# Patient Record
Sex: Male | Born: 1945 | Race: White | Hispanic: No | Marital: Single | Smoking: Former smoker
Health system: Southern US, Community
[De-identification: ages and names within clinical notes are randomized; demographics above are authoritative.]

## PROBLEM LIST (undated history)

## (undated) DIAGNOSIS — I639 Cerebral infarction, unspecified: Secondary | ICD-10-CM

## (undated) DIAGNOSIS — R233 Spontaneous ecchymoses: Secondary | ICD-10-CM

## (undated) DIAGNOSIS — I2510 Atherosclerotic heart disease of native coronary artery without angina pectoris: Secondary | ICD-10-CM

## (undated) DIAGNOSIS — R197 Diarrhea, unspecified: Secondary | ICD-10-CM

## (undated) DIAGNOSIS — F32A Depression, unspecified: Secondary | ICD-10-CM

## (undated) DIAGNOSIS — T8859XA Other complications of anesthesia, initial encounter: Secondary | ICD-10-CM

## (undated) DIAGNOSIS — R531 Weakness: Secondary | ICD-10-CM

## (undated) DIAGNOSIS — T884XXA Failed or difficult intubation, initial encounter: Secondary | ICD-10-CM

## (undated) DIAGNOSIS — G709 Myoneural disorder, unspecified: Secondary | ICD-10-CM

## (undated) DIAGNOSIS — E785 Hyperlipidemia, unspecified: Secondary | ICD-10-CM

## (undated) DIAGNOSIS — I739 Peripheral vascular disease, unspecified: Secondary | ICD-10-CM

## (undated) DIAGNOSIS — E119 Type 2 diabetes mellitus without complications: Secondary | ICD-10-CM

## (undated) DIAGNOSIS — I779 Disorder of arteries and arterioles, unspecified: Secondary | ICD-10-CM

## (undated) DIAGNOSIS — I10 Essential (primary) hypertension: Secondary | ICD-10-CM

## (undated) DIAGNOSIS — R238 Other skin changes: Secondary | ICD-10-CM

## (undated) DIAGNOSIS — Z720 Tobacco use: Secondary | ICD-10-CM

## (undated) DIAGNOSIS — I509 Heart failure, unspecified: Secondary | ICD-10-CM

## (undated) DIAGNOSIS — D649 Anemia, unspecified: Secondary | ICD-10-CM

## (undated) DIAGNOSIS — T4145XA Adverse effect of unspecified anesthetic, initial encounter: Secondary | ICD-10-CM

## (undated) DIAGNOSIS — I5022 Chronic systolic (congestive) heart failure: Secondary | ICD-10-CM

## (undated) DIAGNOSIS — N289 Disorder of kidney and ureter, unspecified: Secondary | ICD-10-CM

## (undated) DIAGNOSIS — I255 Ischemic cardiomyopathy: Secondary | ICD-10-CM

## (undated) DIAGNOSIS — M1990 Unspecified osteoarthritis, unspecified site: Secondary | ICD-10-CM

## (undated) DIAGNOSIS — F329 Major depressive disorder, single episode, unspecified: Secondary | ICD-10-CM

## (undated) HISTORY — DX: Ischemic cardiomyopathy: I25.5

## (undated) HISTORY — DX: Atherosclerotic heart disease of native coronary artery without angina pectoris: I25.10

## (undated) HISTORY — DX: Chronic systolic (congestive) heart failure: I50.22

## (undated) HISTORY — DX: Weakness: R53.1

## (undated) HISTORY — DX: Cerebral infarction, unspecified: I63.9

## (undated) HISTORY — PX: ERCP: SHX60

## (undated) HISTORY — DX: Type 2 diabetes mellitus without complications: E11.9

## (undated) HISTORY — PX: OTHER SURGICAL HISTORY: SHX169

## (undated) HISTORY — DX: Unspecified osteoarthritis, unspecified site: M19.90

## (undated) HISTORY — DX: Peripheral vascular disease, unspecified: I73.9

## (undated) HISTORY — DX: Spontaneous ecchymoses: R23.3

## (undated) HISTORY — PX: COLON SURGERY: SHX602

## (undated) HISTORY — DX: Diarrhea, unspecified: R19.7

## (undated) HISTORY — PX: CERVICAL DISC SURGERY: SHX588

## (undated) HISTORY — DX: Disorder of arteries and arterioles, unspecified: I77.9

## (undated) HISTORY — DX: Other skin changes: R23.8

---

## 2005-11-26 ENCOUNTER — Emergency Department (HOSPITAL_COMMUNITY): Admission: EM | Admit: 2005-11-26 | Discharge: 2005-11-26 | Payer: Self-pay | Admitting: Family Medicine

## 2009-02-01 HISTORY — PX: EYE SURGERY: SHX253

## 2010-09-24 ENCOUNTER — Other Ambulatory Visit: Payer: Self-pay | Admitting: Cardiology

## 2010-09-25 ENCOUNTER — Encounter (INDEPENDENT_AMBULATORY_CARE_PROVIDER_SITE_OTHER): Payer: 59 | Admitting: *Deleted

## 2010-09-25 DIAGNOSIS — H34219 Partial retinal artery occlusion, unspecified eye: Secondary | ICD-10-CM

## 2010-09-25 DIAGNOSIS — I6529 Occlusion and stenosis of unspecified carotid artery: Secondary | ICD-10-CM

## 2010-10-06 ENCOUNTER — Encounter: Payer: Self-pay | Admitting: *Deleted

## 2011-04-06 ENCOUNTER — Encounter (HOSPITAL_COMMUNITY)
Admission: EM | Disposition: A | Discharge status: Home / Self Care | Payer: Self-pay | Source: Ambulatory Visit | Attending: Thoracic Surgery (Cardiothoracic Vascular Surgery)

## 2011-04-06 ENCOUNTER — Other Ambulatory Visit: Payer: Self-pay

## 2011-04-06 ENCOUNTER — Emergency Department (HOSPITAL_COMMUNITY): Payer: 59 | Admitting: Anesthesiology

## 2011-04-06 ENCOUNTER — Inpatient Hospital Stay (HOSPITAL_COMMUNITY)
Admission: EM | Admit: 2011-04-06 | Discharge: 2011-04-16 | DRG: 233 | Disposition: A | Discharge status: Home / Self Care | Payer: 59 | Source: Ambulatory Visit | Attending: Thoracic Surgery (Cardiothoracic Vascular Surgery) | Admitting: Thoracic Surgery (Cardiothoracic Vascular Surgery)

## 2011-04-06 ENCOUNTER — Encounter (HOSPITAL_COMMUNITY): Payer: Self-pay | Admitting: Anesthesiology

## 2011-04-06 ENCOUNTER — Encounter (HOSPITAL_COMMUNITY): Payer: Self-pay | Admitting: Emergency Medicine

## 2011-04-06 ENCOUNTER — Emergency Department (HOSPITAL_COMMUNITY): Payer: 59

## 2011-04-06 ENCOUNTER — Inpatient Hospital Stay (HOSPITAL_COMMUNITY): Payer: 59

## 2011-04-06 DIAGNOSIS — IMO0002 Reserved for concepts with insufficient information to code with codable children: Secondary | ICD-10-CM | POA: Diagnosis not present

## 2011-04-06 DIAGNOSIS — I10 Essential (primary) hypertension: Secondary | ICD-10-CM

## 2011-04-06 DIAGNOSIS — Z720 Tobacco use: Secondary | ICD-10-CM

## 2011-04-06 DIAGNOSIS — R4701 Aphasia: Secondary | ICD-10-CM | POA: Diagnosis not present

## 2011-04-06 DIAGNOSIS — I2109 ST elevation (STEMI) myocardial infarction involving other coronary artery of anterior wall: Principal | ICD-10-CM | POA: Diagnosis present

## 2011-04-06 DIAGNOSIS — I213 ST elevation (STEMI) myocardial infarction of unspecified site: Secondary | ICD-10-CM

## 2011-04-06 DIAGNOSIS — R079 Chest pain, unspecified: Secondary | ICD-10-CM

## 2011-04-06 DIAGNOSIS — E785 Hyperlipidemia, unspecified: Secondary | ICD-10-CM

## 2011-04-06 DIAGNOSIS — I2510 Atherosclerotic heart disease of native coronary artery without angina pectoris: Secondary | ICD-10-CM

## 2011-04-06 DIAGNOSIS — Y9210 Unspecified residential institution as the place of occurrence of the external cause: Secondary | ICD-10-CM | POA: Diagnosis not present

## 2011-04-06 DIAGNOSIS — E872 Acidosis, unspecified: Secondary | ICD-10-CM | POA: Diagnosis not present

## 2011-04-06 DIAGNOSIS — I214 Non-ST elevation (NSTEMI) myocardial infarction: Secondary | ICD-10-CM

## 2011-04-06 DIAGNOSIS — F17200 Nicotine dependence, unspecified, uncomplicated: Secondary | ICD-10-CM | POA: Diagnosis present

## 2011-04-06 DIAGNOSIS — E119 Type 2 diabetes mellitus without complications: Secondary | ICD-10-CM | POA: Diagnosis present

## 2011-04-06 DIAGNOSIS — R570 Cardiogenic shock: Secondary | ICD-10-CM

## 2011-04-06 DIAGNOSIS — N179 Acute kidney failure, unspecified: Secondary | ICD-10-CM | POA: Diagnosis present

## 2011-04-06 DIAGNOSIS — J90 Pleural effusion, not elsewhere classified: Secondary | ICD-10-CM | POA: Diagnosis present

## 2011-04-06 DIAGNOSIS — R0789 Other chest pain: Secondary | ICD-10-CM

## 2011-04-06 DIAGNOSIS — R0600 Dyspnea, unspecified: Secondary | ICD-10-CM

## 2011-04-06 DIAGNOSIS — Z8249 Family history of ischemic heart disease and other diseases of the circulatory system: Secondary | ICD-10-CM

## 2011-04-06 DIAGNOSIS — R4182 Altered mental status, unspecified: Secondary | ICD-10-CM | POA: Diagnosis not present

## 2011-04-06 DIAGNOSIS — I509 Heart failure, unspecified: Secondary | ICD-10-CM | POA: Diagnosis present

## 2011-04-06 DIAGNOSIS — I519 Heart disease, unspecified: Secondary | ICD-10-CM | POA: Diagnosis present

## 2011-04-06 DIAGNOSIS — I6340 Cerebral infarction due to embolism of unspecified cerebral artery: Secondary | ICD-10-CM | POA: Diagnosis not present

## 2011-04-06 DIAGNOSIS — J9819 Other pulmonary collapse: Secondary | ICD-10-CM | POA: Diagnosis present

## 2011-04-06 DIAGNOSIS — D62 Acute posthemorrhagic anemia: Secondary | ICD-10-CM | POA: Diagnosis not present

## 2011-04-06 DIAGNOSIS — Y849 Medical procedure, unspecified as the cause of abnormal reaction of the patient, or of later complication, without mention of misadventure at the time of the procedure: Secondary | ICD-10-CM | POA: Diagnosis not present

## 2011-04-06 DIAGNOSIS — R000 Tachycardia, unspecified: Secondary | ICD-10-CM

## 2011-04-06 HISTORY — DX: Essential (primary) hypertension: I10

## 2011-04-06 HISTORY — DX: Tobacco use: Z72.0

## 2011-04-06 HISTORY — DX: Hyperlipidemia, unspecified: E78.5

## 2011-04-06 HISTORY — PX: LEFT HEART CATHETERIZATION WITH CORONARY ANGIOGRAM: SHX5451

## 2011-04-06 HISTORY — PX: CORONARY ARTERY BYPASS GRAFT: SHX141

## 2011-04-06 HISTORY — DX: Heart failure, unspecified: I50.9

## 2011-04-06 LAB — POCT I-STAT, CHEM 8
Chloride: 105 mEq/L (ref 96–112)
Glucose, Bld: 124 mg/dL — ABNORMAL HIGH (ref 70–99)
HCT: 26 % — ABNORMAL LOW (ref 39.0–52.0)
Hemoglobin: 8.8 g/dL — ABNORMAL LOW (ref 13.0–17.0)
Potassium: 4.4 mEq/L (ref 3.5–5.1)
Sodium: 133 mEq/L — ABNORMAL LOW (ref 135–145)

## 2011-04-06 LAB — CBC
HCT: 40.2 % (ref 39.0–52.0)
HCT: 31.2 % — ABNORMAL LOW (ref 39.0–52.0)
HCT: 28.1 % — ABNORMAL LOW (ref 39.0–52.0)
Hemoglobin: 14.5 g/dL (ref 13.0–17.0)
Hemoglobin: 10.2 g/dL — ABNORMAL LOW (ref 13.0–17.0)
MCH: 32 pg (ref 26.0–34.0)
MCHC: 36.1 g/dL — ABNORMAL HIGH (ref 30.0–36.0)
Platelets: 213 10*3/uL (ref 150–400)
RBC: 3.58 MIL/uL — ABNORMAL LOW (ref 4.22–5.81)
RDW: 14.1 % (ref 11.5–15.5)
WBC: 21.4 10*3/uL — ABNORMAL HIGH (ref 4.0–10.5)
WBC: 17 10*3/uL — ABNORMAL HIGH (ref 4.0–10.5)

## 2011-04-06 LAB — DIFFERENTIAL
Basophils Relative: 0 % (ref 0–1)
Eosinophils Absolute: 0 10*3/uL (ref 0.0–0.7)
Eosinophils Relative: 0 % (ref 0–5)
Monocytes Absolute: 1.4 10*3/uL — ABNORMAL HIGH (ref 0.1–1.0)
Monocytes Relative: 10 % (ref 3–12)

## 2011-04-06 LAB — POCT I-STAT 3, ART BLOOD GAS (G3+)
Acid-base deficit: 7 mmol/L — ABNORMAL HIGH (ref 0.0–2.0)
Bicarbonate: 20.1 mEq/L (ref 20.0–24.0)
Bicarbonate: 18.4 mEq/L — ABNORMAL LOW (ref 20.0–24.0)
O2 Saturation: 90 %
Patient temperature: 37.2
TCO2: 21 mmol/L (ref 0–100)
pCO2 arterial: 39 mmHg (ref 35.0–45.0)
pH, Arterial: 7.32 — ABNORMAL LOW (ref 7.350–7.450)
pH, Arterial: 7.34 — ABNORMAL LOW (ref 7.350–7.450)
pO2, Arterial: 63 mmHg — ABNORMAL LOW (ref 80.0–100.0)
pO2, Arterial: 96 mmHg (ref 80.0–100.0)

## 2011-04-06 LAB — COMPREHENSIVE METABOLIC PANEL
Albumin: 3.3 g/dL — ABNORMAL LOW (ref 3.5–5.2)
BUN: 26 mg/dL — ABNORMAL HIGH (ref 6–23)
Chloride: 96 mEq/L (ref 96–112)
Creatinine, Ser: 1.29 mg/dL (ref 0.50–1.35)
Total Bilirubin: 1.2 mg/dL (ref 0.3–1.2)
Total Protein: 7.3 g/dL (ref 6.0–8.3)

## 2011-04-06 LAB — CREATININE, SERUM
Creatinine, Ser: 1.35 mg/dL (ref 0.50–1.35)
GFR calc non Af Amer: 54 mL/min — ABNORMAL LOW (ref >90)

## 2011-04-06 LAB — HEMOGLOBIN AND HEMATOCRIT, BLOOD: Hemoglobin: 8.3 g/dL — ABNORMAL LOW (ref 13.0–17.0)

## 2011-04-06 LAB — PROTIME-INR
INR: 1.49 (ref 0.00–1.49)
Prothrombin Time: 18.3 seconds — ABNORMAL HIGH (ref 11.6–15.2)

## 2011-04-06 LAB — MAGNESIUM: Magnesium: 3 mg/dL — ABNORMAL HIGH (ref 1.5–2.5)

## 2011-04-06 LAB — D-DIMER, QUANTITATIVE: D-Dimer, Quant: 1.43 ug/mL-FEU — ABNORMAL HIGH (ref 0.00–0.48)

## 2011-04-06 LAB — POCT I-STAT 4, (NA,K, GLUC, HGB,HCT)
HCT: 34 % — ABNORMAL LOW (ref 39.0–52.0)
Hemoglobin: 11.6 g/dL — ABNORMAL LOW (ref 13.0–17.0)

## 2011-04-06 LAB — APTT: aPTT: 42 seconds — ABNORMAL HIGH (ref 24–37)

## 2011-04-06 LAB — GLUCOSE, CAPILLARY
Glucose-Capillary: 101 mg/dL — ABNORMAL HIGH (ref 70–99)
Glucose-Capillary: 90 mg/dL (ref 70–99)

## 2011-04-06 LAB — PREPARE RBC (CROSSMATCH)

## 2011-04-06 LAB — PLATELET COUNT: Platelets: 189 10*3/uL (ref 150–400)

## 2011-04-06 LAB — TROPONIN I: Troponin I: >25 ng/mL (ref <0.30)

## 2011-04-06 SURGERY — LEFT HEART CATHETERIZATION WITH CORONARY ANGIOGRAM
Anesthesia: LOCAL

## 2011-04-06 SURGERY — CORONARY ARTERY BYPASS GRAFTING (CABG)
Anesthesia: General | Site: Chest | Wound class: Clean

## 2011-04-06 MED ORDER — FENTANYL CITRATE 0.05 MG/ML IJ SOLN
INTRAMUSCULAR | Status: AC
  Filled 2011-04-06: qty 2

## 2011-04-06 MED ORDER — HEPARIN SODIUM (PORCINE) 5000 UNIT/ML IJ SOLN
60.0000 [IU]/kg | INTRAMUSCULAR | Status: AC
  Administered 2011-04-06: 5000 [IU] via INTRAVENOUS
  Filled 2011-04-06: qty 1

## 2011-04-06 MED ORDER — 0.9 % SODIUM CHLORIDE (POUR BTL) OPTIME
TOPICAL | Status: DC | PRN
  Administered 2011-04-06: 5000 mL

## 2011-04-06 MED ORDER — SODIUM CHLORIDE 0.9 % IV SOLN
1250.0000 mg | INTRAVENOUS | Status: AC
  Administered 2011-04-06: 1250 mg via INTRAVENOUS
  Filled 2011-04-06: qty 1250

## 2011-04-06 MED ORDER — LACTATED RINGERS IV SOLN
INTRAVENOUS | Status: DC
  Administered 2011-04-06: 20 mL/h via INTRAVENOUS
  Administered 2011-04-09: 06:00:00 via INTRAVENOUS

## 2011-04-06 MED ORDER — MILRINONE IN DEXTROSE 200-5 MCG/ML-% IV SOLN
INTRAVENOUS | Status: DC | PRN
  Administered 2011-04-06: .33 ug/kg/min via INTRAVENOUS

## 2011-04-06 MED ORDER — LIDOCAINE HCL (PF) 1 % IJ SOLN
INTRAMUSCULAR | Status: AC
  Filled 2011-04-06: qty 30

## 2011-04-06 MED ORDER — DOPAMINE-DEXTROSE 3.2-5 MG/ML-% IV SOLN
3.0000 ug/kg/min | INTRAVENOUS | Status: DC
  Administered 2011-04-06: 4 ug/kg/min via INTRAVENOUS
  Administered 2011-04-09: 3 ug/kg/min via INTRAVENOUS
  Filled 2011-04-06: qty 250

## 2011-04-06 MED ORDER — SODIUM CHLORIDE 0.9 % IJ SOLN
3.0000 mL | Freq: Two times a day (BID) | INTRAMUSCULAR | Status: DC
  Administered 2011-04-07 – 2011-04-13 (×7): 3 mL via INTRAVENOUS

## 2011-04-06 MED ORDER — CEFUROXIME SODIUM 1.5 G IJ SOLR
1.5000 g | INTRAMUSCULAR | Status: AC
  Administered 2011-04-06: .75 g via INTRAVENOUS
  Administered 2011-04-06: 1.5 g via INTRAVENOUS
  Filled 2011-04-06: qty 1.5

## 2011-04-06 MED ORDER — VERAPAMIL HCL 2.5 MG/ML IV SOLN
Freq: Once | INTRAVENOUS | Status: AC
  Administered 2011-04-06: 14:00:00
  Filled 2011-04-06: qty 2.5

## 2011-04-06 MED ORDER — FAMOTIDINE IN NACL 20-0.9 MG/50ML-% IV SOLN
20.0000 mg | Freq: Two times a day (BID) | INTRAVENOUS | Status: AC
  Administered 2011-04-06 – 2011-04-07 (×2): 20 mg via INTRAVENOUS
  Filled 2011-04-06: qty 50

## 2011-04-06 MED ORDER — SODIUM CHLORIDE 0.9 % IJ SOLN: 3.0000 mL | INTRAMUSCULAR | Status: DC | PRN

## 2011-04-06 MED ORDER — HEMOSTATIC AGENTS (NO CHARGE) OPTIME: TOPICAL | Status: DC | PRN

## 2011-04-06 MED ORDER — SODIUM CHLORIDE 0.9 % IV SOLN
INTRAVENOUS | Status: DC
  Administered 2011-04-06 – 2011-04-12 (×3): via INTRAVENOUS

## 2011-04-06 MED ORDER — EPINEPHRINE HCL 1 MG/ML IJ SOLN
0.5000 ug/min | INTRAVENOUS | Status: DC
  Filled 2011-04-06: qty 4

## 2011-04-06 MED ORDER — SODIUM CHLORIDE 0.9 % IV SOLN
0.1000 ug/kg/h | INTRAVENOUS | Status: DC
  Administered 2011-04-07 (×2): 0.7 ug/kg/h via INTRAVENOUS
  Filled 2011-04-06 (×3): qty 2

## 2011-04-06 MED ORDER — HEPARIN (PORCINE) IN NACL 2-0.9 UNIT/ML-% IJ SOLN
INTRAMUSCULAR | Status: AC
  Filled 2011-04-06: qty 2000

## 2011-04-06 MED ORDER — BISACODYL 10 MG RE SUPP: 10.0000 mg | Freq: Every day | RECTAL | Status: DC

## 2011-04-06 MED ORDER — OXYCODONE HCL 5 MG PO TABS
5.0000 mg | ORAL_TABLET | ORAL | Status: DC | PRN
  Administered 2011-04-08 – 2011-04-09 (×2): 5 mg via ORAL
  Administered 2011-04-10 – 2011-04-12 (×2): 10 mg via ORAL
  Filled 2011-04-06 (×3): qty 1
  Filled 2011-04-06 (×2): qty 2

## 2011-04-06 MED ORDER — SODIUM CHLORIDE 0.9 % IV SOLN
INTRAVENOUS | Status: DC
  Administered 2011-04-06: 1.8 [IU]/h via INTRAVENOUS
  Administered 2011-04-06: 0.7 [IU]/h via INTRAVENOUS
  Administered 2011-04-06: 0.8 [IU]/h via INTRAVENOUS
  Filled 2011-04-06: qty 1

## 2011-04-06 MED ORDER — METOPROLOL TARTRATE 12.5 MG HALF TABLET
12.5000 mg | ORAL_TABLET | Freq: Two times a day (BID) | ORAL | Status: DC
  Administered 2011-04-09: 12.5 mg via ORAL
  Filled 2011-04-06 (×7): qty 1

## 2011-04-06 MED ORDER — PANTOPRAZOLE SODIUM 40 MG PO TBEC
40.0000 mg | DELAYED_RELEASE_TABLET | Freq: Every day | ORAL | Status: DC
  Administered 2011-04-08 – 2011-04-16 (×9): 40 mg via ORAL
  Filled 2011-04-06 (×9): qty 1

## 2011-04-06 MED ORDER — HEPARIN SODIUM (PORCINE) 1000 UNIT/ML IJ SOLN
INTRAMUSCULAR | Status: DC | PRN
  Administered 2011-04-06: 2000 [IU] via INTRAVENOUS
  Administered 2011-04-06: 30000 [IU] via INTRAVENOUS

## 2011-04-06 MED ORDER — PROTAMINE SULFATE 10 MG/ML IV SOLN
INTRAVENOUS | Status: DC | PRN
  Administered 2011-04-06 (×2): 150 mg via INTRAVENOUS

## 2011-04-06 MED ORDER — AMINOCAPROIC ACID 250 MG/ML IV SOLN
INTRAVENOUS | Status: DC | PRN
  Administered 2011-04-06: 5 g via INTRAVENOUS

## 2011-04-06 MED ORDER — NITROGLYCERIN IN D5W 200-5 MCG/ML-% IV SOLN
2.0000 ug/min | INTRAVENOUS | Status: DC
  Filled 2011-04-06: qty 250

## 2011-04-06 MED ORDER — MORPHINE SULFATE 2 MG/ML IJ SOLN: 1.0000 mg | INTRAMUSCULAR | Status: AC | PRN

## 2011-04-06 MED ORDER — INSULIN REGULAR BOLUS VIA INFUSION
0.0000 [IU] | Freq: Three times a day (TID) | INTRAVENOUS | Status: DC
  Filled 2011-04-06: qty 10

## 2011-04-06 MED ORDER — PROPRANOLOL HCL 1 MG/ML IV SOLN
INTRAVENOUS | Status: DC | PRN
  Administered 2011-04-06: .5 mg via INTRAVENOUS
  Administered 2011-04-06: .25 mg via INTRAVENOUS
  Administered 2011-04-06: .5 mg via INTRAVENOUS

## 2011-04-06 MED ORDER — NITROGLYCERIN IN D5W 200-5 MCG/ML-% IV SOLN
INTRAVENOUS | Status: DC | PRN
  Administered 2011-04-06: 16.6 ug/min via INTRAVENOUS

## 2011-04-06 MED ORDER — ATORVASTATIN CALCIUM 80 MG PO TABS
80.0000 mg | ORAL_TABLET | Freq: Every day | ORAL | Status: DC
  Administered 2011-04-07 – 2011-04-15 (×8): 80 mg via ORAL
  Filled 2011-04-06 (×10): qty 1

## 2011-04-06 MED ORDER — SODIUM CHLORIDE 0.9 % IV SOLN
INTRAVENOUS | Status: DC
  Administered 2011-04-06: 10:00:00 via INTRAVENOUS

## 2011-04-06 MED ORDER — HEPARIN (PORCINE) IN NACL 100-0.45 UNIT/ML-% IJ SOLN
850.0000 [IU]/h | INTRAMUSCULAR | Status: DC
  Filled 2011-04-06 (×2): qty 250

## 2011-04-06 MED ORDER — NITROGLYCERIN 0.2 MG/ML ON CALL CATH LAB
INTRAVENOUS | Status: AC
  Filled 2011-04-06: qty 1

## 2011-04-06 MED ORDER — HEPARIN SODIUM (PORCINE) 5000 UNIT/ML IJ SOLN
INTRAMUSCULAR | Status: AC
  Administered 2011-04-06: 5000 [IU] via INTRAVENOUS
  Filled 2011-04-06: qty 1

## 2011-04-06 MED ORDER — POTASSIUM CHLORIDE 2 MEQ/ML IV SOLN
80.0000 meq | INTRAVENOUS | Status: DC
  Filled 2011-04-06: qty 40

## 2011-04-06 MED ORDER — DOPAMINE-DEXTROSE 3.2-5 MG/ML-% IV SOLN
INTRAVENOUS | Status: DC | PRN
  Administered 2011-04-06: 5 ug/kg/min via INTRAVENOUS

## 2011-04-06 MED ORDER — ROCURONIUM BROMIDE 100 MG/10ML IV SOLN
INTRAVENOUS | Status: DC | PRN
  Administered 2011-04-06: 40 mg via INTRAVENOUS
  Administered 2011-04-06: 10 mg via INTRAVENOUS
  Administered 2011-04-06: 50 mg via INTRAVENOUS

## 2011-04-06 MED ORDER — MIDAZOLAM HCL 2 MG/2ML IJ SOLN
2.0000 mg | INTRAMUSCULAR | Status: DC | PRN
  Administered 2011-04-06 – 2011-04-07 (×4): 2 mg via INTRAVENOUS
  Filled 2011-04-06 (×3): qty 2

## 2011-04-06 MED ORDER — SODIUM CHLORIDE 0.9 % IV SOLN
INTRAVENOUS | Status: DC | PRN
  Administered 2011-04-06: 11:00:00 via INTRAVENOUS

## 2011-04-06 MED ORDER — DOCUSATE SODIUM 100 MG PO CAPS
200.0000 mg | ORAL_CAPSULE | Freq: Every day | ORAL | Status: DC
  Administered 2011-04-07 – 2011-04-15 (×9): 200 mg via ORAL
  Filled 2011-04-06 (×9): qty 2

## 2011-04-06 MED ORDER — MAGNESIUM SULFATE 40 MG/ML IJ SOLN
INTRAMUSCULAR | Status: AC
  Administered 2011-04-06: 4 g
  Filled 2011-04-06: qty 100

## 2011-04-06 MED ORDER — SODIUM CHLORIDE 0.9 % IJ SOLN
OROMUCOSAL | Status: DC | PRN
  Administered 2011-04-06 (×3): via TOPICAL

## 2011-04-06 MED ORDER — HEMOSTATIC AGENTS (NO CHARGE) OPTIME
TOPICAL | Status: DC | PRN
  Administered 2011-04-06: 1 via TOPICAL

## 2011-04-06 MED ORDER — CALCIUM CHLORIDE 10 % IV SOLN
1.0000 g | Freq: Once | INTRAVENOUS | Status: AC
  Administered 2011-04-06: 1 g via INTRAVENOUS

## 2011-04-06 MED ORDER — SODIUM CHLORIDE 0.9 % IV SOLN
INTRAVENOUS | Status: DC
  Filled 2011-04-06: qty 40

## 2011-04-06 MED ORDER — ACETAMINOPHEN 160 MG/5ML PO SOLN
975.0000 mg | Freq: Four times a day (QID) | ORAL | Status: AC
  Administered 2011-04-07: 975 mg
  Filled 2011-04-06 (×2): qty 40.6

## 2011-04-06 MED ORDER — ACETAMINOPHEN 650 MG RE SUPP
650.0000 mg | RECTAL | Status: AC
  Administered 2011-04-06: 650 mg via RECTAL

## 2011-04-06 MED ORDER — METOPROLOL TARTRATE 1 MG/ML IV SOLN: 2.5000 mg | INTRAVENOUS | Status: DC | PRN

## 2011-04-06 MED ORDER — SODIUM CHLORIDE 0.9 % IV SOLN
INTRAVENOUS | Status: DC
  Filled 2011-04-06: qty 1

## 2011-04-06 MED ORDER — PHENYLEPHRINE HCL 10 MG/ML IJ SOLN
0.0000 ug/min | INTRAVENOUS | Status: DC
  Administered 2011-04-06: 50 ug/min via INTRAVENOUS
  Administered 2011-04-07: 40 ug/min via INTRAVENOUS
  Administered 2011-04-07: 65 ug/min via INTRAVENOUS
  Administered 2011-04-07: 60 ug/min via INTRAVENOUS
  Administered 2011-04-08: 40 ug/min via INTRAVENOUS
  Administered 2011-04-09: 15 ug/min via INTRAVENOUS
  Administered 2011-04-10: 33 ug/min via INTRAVENOUS
  Administered 2011-04-10: 40 ug/min via INTRAVENOUS
  Filled 2011-04-06 (×9): qty 2

## 2011-04-06 MED ORDER — SODIUM CHLORIDE 0.45 % IV SOLN
INTRAVENOUS | Status: DC
  Administered 2011-04-06: 18:00:00 via INTRAVENOUS

## 2011-04-06 MED ORDER — MIDAZOLAM HCL 2 MG/2ML IJ SOLN
INTRAMUSCULAR | Status: AC
  Filled 2011-04-06: qty 2

## 2011-04-06 MED ORDER — LACTATED RINGERS IV SOLN
INTRAVENOUS | Status: DC | PRN
  Administered 2011-04-06 (×3): via INTRAVENOUS

## 2011-04-06 MED ORDER — ONDANSETRON HCL 4 MG/2ML IJ SOLN: 4.0000 mg | Freq: Four times a day (QID) | INTRAMUSCULAR | Status: DC | PRN

## 2011-04-06 MED ORDER — SODIUM CHLORIDE 0.9 % IJ SOLN
3.0000 mL | Freq: Two times a day (BID) | INTRAMUSCULAR | Status: DC
  Administered 2011-04-06 – 2011-04-08 (×4): 3 mL via INTRAVENOUS
  Administered 2011-04-08: 08:00:00 via INTRAVENOUS
  Administered 2011-04-09 – 2011-04-11 (×5): 3 mL via INTRAVENOUS

## 2011-04-06 MED ORDER — SODIUM CHLORIDE 0.9 % IV SOLN
200.0000 ug | INTRAVENOUS | Status: DC | PRN
  Administered 2011-04-06: 0.4 ug/kg/h via INTRAVENOUS

## 2011-04-06 MED ORDER — SODIUM CHLORIDE 0.9 % IV SOLN
250.0000 mL | INTRAVENOUS | Status: DC
  Administered 2011-04-07: 250 mL via INTRAVENOUS

## 2011-04-06 MED ORDER — NITROGLYCERIN IN D5W 200-5 MCG/ML-% IV SOLN: 0.0000 ug/min | INTRAVENOUS | Status: DC

## 2011-04-06 MED ORDER — ALBUMIN HUMAN 5 % IV SOLN
250.0000 mL | INTRAVENOUS | Status: AC | PRN
  Administered 2011-04-06 – 2011-04-07 (×4): 250 mL via INTRAVENOUS
  Filled 2011-04-06 (×2): qty 250

## 2011-04-06 MED ORDER — ASPIRIN 81 MG PO CHEW: 324.0000 mg | CHEWABLE_TABLET | ORAL | Status: AC

## 2011-04-06 MED ORDER — VERAPAMIL HCL 2.5 MG/ML IV SOLN
INTRAVENOUS | Status: DC
  Filled 2011-04-06: qty 2.5

## 2011-04-06 MED ORDER — MIDAZOLAM HCL 5 MG/5ML IJ SOLN
INTRAMUSCULAR | Status: DC | PRN
  Administered 2011-04-06 (×2): 3 mg via INTRAVENOUS
  Administered 2011-04-06 (×2): 2 mg via INTRAVENOUS

## 2011-04-06 MED ORDER — PHENYLEPHRINE HCL 10 MG/ML IJ SOLN
30.0000 ug/min | INTRAVENOUS | Status: DC
  Filled 2011-04-06: qty 2

## 2011-04-06 MED ORDER — DEXTROSE 5 % IV SOLN
750.0000 mg | INTRAVENOUS | Status: DC
  Filled 2011-04-06: qty 750

## 2011-04-06 MED ORDER — ASPIRIN 81 MG PO CHEW
324.0000 mg | CHEWABLE_TABLET | ORAL | Status: AC
  Administered 2011-04-06: 324 mg via ORAL
  Filled 2011-04-06: qty 4

## 2011-04-06 MED ORDER — SODIUM CHLORIDE 0.9 % IV SOLN: INTRAVENOUS | Status: DC

## 2011-04-06 MED ORDER — ACETAMINOPHEN 500 MG PO TABS
1000.0000 mg | ORAL_TABLET | Freq: Four times a day (QID) | ORAL | Status: AC
  Administered 2011-04-07 – 2011-04-11 (×13): 1000 mg via ORAL
  Filled 2011-04-06 (×18): qty 2

## 2011-04-06 MED ORDER — MAGNESIUM SULFATE 40 MG/ML IJ SOLN
4.0000 g | Freq: Once | INTRAMUSCULAR | Status: AC
  Administered 2011-04-06: 4 g via INTRAVENOUS

## 2011-04-06 MED ORDER — AMINOCAPROIC ACID 250 MG/ML IV SOLN
INTRAVENOUS | Status: DC
  Filled 2011-04-06: qty 40

## 2011-04-06 MED ORDER — POTASSIUM CHLORIDE 10 MEQ/50ML IV SOLN: 10.0000 meq | INTRAVENOUS | Status: AC

## 2011-04-06 MED ORDER — DOPAMINE-DEXTROSE 3.2-5 MG/ML-% IV SOLN
2.0000 ug/kg/min | INTRAVENOUS | Status: DC
  Filled 2011-04-06: qty 250

## 2011-04-06 MED ORDER — ASPIRIN EC 325 MG PO TBEC
325.0000 mg | DELAYED_RELEASE_TABLET | Freq: Every day | ORAL | Status: DC
  Administered 2011-04-07 – 2011-04-16 (×10): 325 mg via ORAL
  Filled 2011-04-06 (×11): qty 1

## 2011-04-06 MED ORDER — MILRINONE IN DEXTROSE 200-5 MCG/ML-% IV SOLN
0.2500 ug/kg/min | INTRAVENOUS | Status: DC
  Administered 2011-04-06 – 2011-04-07 (×2): 0.25 ug/kg/min via INTRAVENOUS
  Filled 2011-04-06 (×2): qty 100

## 2011-04-06 MED ORDER — LACTATED RINGERS IV SOLN: 500.0000 mL | Freq: Once | INTRAVENOUS | Status: AC | PRN

## 2011-04-06 MED ORDER — VECURONIUM BROMIDE 10 MG IV SOLR
INTRAVENOUS | Status: DC | PRN
  Administered 2011-04-06: 10 mg via INTRAVENOUS

## 2011-04-06 MED ORDER — SODIUM CHLORIDE 0.9 % IV SOLN
0.1000 ug/kg/h | INTRAVENOUS | Status: DC
  Filled 2011-04-06: qty 4

## 2011-04-06 MED ORDER — SODIUM CHLORIDE 0.9 % IV SOLN: 250.0000 mL | INTRAVENOUS | Status: DC | PRN

## 2011-04-06 MED ORDER — MORPHINE SULFATE 4 MG/ML IJ SOLN
2.0000 mg | INTRAMUSCULAR | Status: DC | PRN
  Administered 2011-04-06: 4 mg via INTRAVENOUS
  Administered 2011-04-07: 2 mg via INTRAVENOUS
  Administered 2011-04-07: 4 mg via INTRAVENOUS
  Filled 2011-04-06 (×4): qty 1

## 2011-04-06 MED ORDER — ASPIRIN 81 MG PO CHEW: 324.0000 mg | CHEWABLE_TABLET | Freq: Every day | ORAL | Status: DC

## 2011-04-06 MED ORDER — MAGNESIUM SULFATE 50 % IJ SOLN
40.0000 meq | INTRAMUSCULAR | Status: DC
  Filled 2011-04-06: qty 10

## 2011-04-06 MED ORDER — PROPOFOL 10 MG/ML IV EMUL
INTRAVENOUS | Status: DC | PRN
  Administered 2011-04-06: 40 mg via INTRAVENOUS

## 2011-04-06 MED ORDER — BISACODYL 5 MG PO TBEC
10.0000 mg | DELAYED_RELEASE_TABLET | Freq: Every day | ORAL | Status: DC
  Administered 2011-04-07 – 2011-04-12 (×6): 10 mg via ORAL
  Filled 2011-04-06 (×7): qty 2

## 2011-04-06 MED ORDER — ACETAMINOPHEN 160 MG/5ML PO SOLN: 650.0000 mg | ORAL | Status: AC

## 2011-04-06 MED ORDER — DEXTROSE 5 % IV SOLN
1.5000 g | Freq: Two times a day (BID) | INTRAVENOUS | Status: AC
  Administered 2011-04-06 – 2011-04-08 (×4): 1.5 g via INTRAVENOUS
  Filled 2011-04-06 (×4): qty 1.5

## 2011-04-06 MED ORDER — METOPROLOL TARTRATE 25 MG/10 ML ORAL SUSPENSION
12.5000 mg | Freq: Two times a day (BID) | ORAL | Status: DC
  Filled 2011-04-06 (×7): qty 5

## 2011-04-06 MED ORDER — SODIUM CHLORIDE 0.9 % IV SOLN
100.0000 [IU] | INTRAVENOUS | Status: DC | PRN
  Administered 2011-04-06: 1 [IU]/h via INTRAVENOUS

## 2011-04-06 MED ORDER — FENTANYL CITRATE 0.05 MG/ML IJ SOLN
INTRAMUSCULAR | Status: DC | PRN
  Administered 2011-04-06: 250 ug via INTRAVENOUS
  Administered 2011-04-06: 150 ug via INTRAVENOUS
  Administered 2011-04-06: 100 ug via INTRAVENOUS
  Administered 2011-04-06: 900 ug via INTRAVENOUS
  Administered 2011-04-06: 100 ug via INTRAVENOUS

## 2011-04-06 MED ORDER — ALBUMIN HUMAN 5 % IV SOLN
INTRAVENOUS | Status: DC | PRN
  Administered 2011-04-06: 16:00:00 via INTRAVENOUS

## 2011-04-06 MED ORDER — CALCIUM CHLORIDE 10 % IV SOLN
INTRAVENOUS | Status: DC | PRN
  Administered 2011-04-06 (×2): .1 g via INTRAVENOUS

## 2011-04-06 MED ORDER — SODIUM CHLORIDE 0.9 % IV SOLN
10.0000 g | INTRAVENOUS | Status: DC | PRN
  Administered 2011-04-06: 1 g/h via INTRAVENOUS

## 2011-04-06 MED ORDER — SODIUM BICARBONATE 8.4 % IV SOLN
50.0000 meq | Freq: Once | INTRAVENOUS | Status: AC
  Administered 2011-04-06: 50 meq via INTRAVENOUS

## 2011-04-06 MED ORDER — PAPAVERINE HCL 30 MG/ML IJ SOLN
INTRAMUSCULAR | Status: DC
  Filled 2011-04-06: qty 2.5

## 2011-04-06 MED ORDER — INSULIN ASPART 100 UNIT/ML ~~LOC~~ SOLN
0.0000 [IU] | SUBCUTANEOUS | Status: DC
  Administered 2011-04-07 (×2): 2 [IU] via SUBCUTANEOUS
  Filled 2011-04-06: qty 3

## 2011-04-06 MED ORDER — VANCOMYCIN HCL 1000 MG IV SOLR
1000.0000 mg | Freq: Once | INTRAVENOUS | Status: AC
  Administered 2011-04-06: 1000 mg via INTRAVENOUS
  Filled 2011-04-06: qty 1000

## 2011-04-06 MED ORDER — MIDAZOLAM HCL 2 MG/2ML IJ SOLN
INTRAMUSCULAR | Status: AC
  Administered 2011-04-06: 2 mg via INTRAVENOUS
  Filled 2011-04-06: qty 2

## 2011-04-06 SURGICAL SUPPLY — 102 items
ADAPTER CARDIO PERF ANTE/RETRO (ADAPTER) IMPLANT
ADPR PRFSN 84XANTGRD RTRGD (ADAPTER)
APPLIER CLIP 9.375 SM OPEN (CLIP)
APR CLP SM 9.3 20 MLT OPN (CLIP)
ATTRACTOMAT 16X20 MAGNETIC DRP (DRAPES) ×2 IMPLANT
BAG DECANTER FOR FLEXI CONT (MISCELLANEOUS) ×2 IMPLANT
BANDAGE ACE 4 STERILE (GAUZE/BANDAGES/DRESSINGS) ×1 IMPLANT
BANDAGE ELASTIC 4 VELCRO ST LF (GAUZE/BANDAGES/DRESSINGS) ×2 IMPLANT
BANDAGE ELASTIC 6 VELCRO ST LF (GAUZE/BANDAGES/DRESSINGS) ×2 IMPLANT
BANDAGE GAUZE ELAST BULKY 4 IN (GAUZE/BANDAGES/DRESSINGS) ×2 IMPLANT
BASKET HEART (ORDER IN 25'S) (MISCELLANEOUS) ×1
BASKET HEART (ORDER IN 25S) (MISCELLANEOUS) ×1 IMPLANT
BLADE SAW STERNAL (BLADE) ×2 IMPLANT
BLADE SURG 11 STRL SS (BLADE) IMPLANT
CANISTER SUCTION 2500CC (MISCELLANEOUS) ×2 IMPLANT
CANN PRFSN .5XCNCT 15X34-48 (MISCELLANEOUS) ×1
CANNULA GUNDRY RCSP 15FR (MISCELLANEOUS) ×1 IMPLANT
CANNULA PRFSN .5XCNCT 15X34-48 (MISCELLANEOUS) ×1 IMPLANT
CANNULA VEN 2 STAGE (MISCELLANEOUS) ×2
CATH CPB KIT HENDRICKSON (MISCELLANEOUS) ×2 IMPLANT
CATH ROBINSON RED A/P 18FR (CATHETERS) ×5 IMPLANT
CATH THORACIC 36FR (CATHETERS) ×1 IMPLANT
CATH THORACIC 36FR RT ANG (CATHETERS) ×2 IMPLANT
CLIP APPLIE 9.375 SM OPEN (CLIP) IMPLANT
CLIP FOGARTY SPRING 6M (CLIP) ×1 IMPLANT
CLIP TI MEDIUM 24 (CLIP) IMPLANT
CLIP TI WIDE RED SMALL 24 (CLIP) ×2 IMPLANT
CLOTH BEACON ORANGE TIMEOUT ST (SAFETY) ×2 IMPLANT
CONN Y 3/8X3/8X3/8  BEN (MISCELLANEOUS)
CONN Y 3/8X3/8X3/8 BEN (MISCELLANEOUS) IMPLANT
COVER SURGICAL LIGHT HANDLE (MISCELLANEOUS) ×4 IMPLANT
CRADLE DONUT ADULT HEAD (MISCELLANEOUS) ×2 IMPLANT
DRAPE CARDIOVASCULAR INCISE (DRAPES) ×2
DRAPE SLUSH/WARMER DISC (DRAPES) ×1 IMPLANT
DRAPE SRG 135X102X78XABS (DRAPES) ×1 IMPLANT
DRSG COVADERM 4X14 (GAUZE/BANDAGES/DRESSINGS) ×2 IMPLANT
ELECT PAD GROUND ADT 9 (MISCELLANEOUS) IMPLANT
ELECT REM PT RETURN 9FT ADLT (ELECTROSURGICAL) ×4
ELECTRODE REM PT RTRN 9FT ADLT (ELECTROSURGICAL) ×2 IMPLANT
GAUZE SPONGE 4X4 12PLY STRL LF (GAUZE/BANDAGES/DRESSINGS) ×1 IMPLANT
GLOVE BIO SURGEON STRL SZ 6 (GLOVE) IMPLANT
GLOVE BIO SURGEON STRL SZ 6.5 (GLOVE) ×3 IMPLANT
GLOVE BIO SURGEON STRL SZ7.5 (GLOVE) ×2 IMPLANT
GLOVE BIOGEL PI IND STRL 6.5 (GLOVE) IMPLANT
GLOVE BIOGEL PI INDICATOR 6.5 (GLOVE) ×2
GLOVE EUDERMIC 7 POWDERFREE (GLOVE) ×6 IMPLANT
GOWN PREVENTION PLUS XLARGE (GOWN DISPOSABLE) ×8 IMPLANT
GOWN STRL NON-REIN LRG LVL3 (GOWN DISPOSABLE) ×8 IMPLANT
HEART VENT LT CURVED (MISCELLANEOUS) ×1 IMPLANT
HEMOSTAT POWDER SURGIFOAM 1G (HEMOSTASIS) ×6 IMPLANT
HEMOSTAT SURGICEL 2X14 (HEMOSTASIS) ×2 IMPLANT
INSERT FOGARTY XLG (MISCELLANEOUS) ×1 IMPLANT
KIT BASIN OR (CUSTOM PROCEDURE TRAY) ×2 IMPLANT
KIT PAIN CUSTOM (MISCELLANEOUS) IMPLANT
KIT ROOM TURNOVER OR (KITS) ×2 IMPLANT
KIT SUCTION CATH 14FR (SUCTIONS) ×4 IMPLANT
KIT VASOVIEW 6 PRO VH 2400 (KITS) ×2 IMPLANT
MARKER GRAFT CORONARY BYPASS (MISCELLANEOUS) ×6 IMPLANT
NS IRRIG 1000ML POUR BTL (IV SOLUTION) ×11 IMPLANT
PACK OPEN HEART (CUSTOM PROCEDURE TRAY) ×2 IMPLANT
PAD ARMBOARD 7.5X6 YLW CONV (MISCELLANEOUS) ×4 IMPLANT
PENCIL BUTTON HOLSTER BLD 10FT (ELECTRODE) ×3 IMPLANT
PUNCH AORTIC ROTATE 4.0MM (MISCELLANEOUS) IMPLANT
PUNCH AORTIC ROTATE 4.5MM 8IN (MISCELLANEOUS) ×1 IMPLANT
PUNCH AORTIC ROTATE 5MM 8IN (MISCELLANEOUS) IMPLANT
SENSOR MYOCARDIAL TEMP (MISCELLANEOUS) ×1 IMPLANT
SET CARDIOPLEGIA MPS 5001102 (MISCELLANEOUS) ×1 IMPLANT
SPONGE GAUZE 4X4 12PLY (GAUZE/BANDAGES/DRESSINGS) ×4 IMPLANT
SUT MNCRL AB 4-0 PS2 18 (SUTURE) IMPLANT
SUT PROLENE 3 0 SH DA (SUTURE) ×2 IMPLANT
SUT PROLENE 4 0 RB 1 (SUTURE)
SUT PROLENE 4 0 SH DA (SUTURE) IMPLANT
SUT PROLENE 4-0 RB1 .5 CRCL 36 (SUTURE) IMPLANT
SUT PROLENE 6 0 C 1 24 (SUTURE) ×1 IMPLANT
SUT PROLENE 6 0 C 1 30 (SUTURE) ×4 IMPLANT
SUT PROLENE 7 0 BV 1 (SUTURE) ×3 IMPLANT
SUT PROLENE 7 0 BV1 MDA (SUTURE) ×4 IMPLANT
SUT PROLENE 8 0 BV175 6 (SUTURE) ×1 IMPLANT
SUT SILK  1 MH (SUTURE) ×1
SUT SILK 1 MH (SUTURE) ×1 IMPLANT
SUT SILK 2 0SH CR/8 30 (SUTURE) ×1 IMPLANT
SUT STEEL 6MS V (SUTURE) ×1 IMPLANT
SUT STEEL STERNAL CCS#1 18IN (SUTURE) IMPLANT
SUT STEEL SZ 6 DBL 3X14 BALL (SUTURE) ×3 IMPLANT
SUT VIC AB 1 CTX 36 (SUTURE) ×4
SUT VIC AB 1 CTX36XBRD ANBCTR (SUTURE) ×2 IMPLANT
SUT VIC AB 2-0 CT1 27 (SUTURE) ×4
SUT VIC AB 2-0 CT1 TAPERPNT 27 (SUTURE) IMPLANT
SUT VIC AB 2-0 CTX 27 (SUTURE) IMPLANT
SUT VIC AB 3-0 SH 27 (SUTURE)
SUT VIC AB 3-0 SH 27X BRD (SUTURE) IMPLANT
SUT VIC AB 3-0 X1 27 (SUTURE) ×1 IMPLANT
SUT VICRYL 4-0 PS2 18IN ABS (SUTURE) IMPLANT
SUTURE E-PAK OPEN HEART (SUTURE) ×2 IMPLANT
SYSTEM SAHARA CHEST DRAIN ATS (WOUND CARE) ×2 IMPLANT
TAPE CLOTH SURG 4X10 WHT LF (GAUZE/BANDAGES/DRESSINGS) ×3 IMPLANT
TOWEL OR 17X24 6PK STRL BLUE (TOWEL DISPOSABLE) ×4 IMPLANT
TOWEL OR 17X26 10 PK STRL BLUE (TOWEL DISPOSABLE) ×6 IMPLANT
TRAY FOLEY IC TEMP SENS 14FR (CATHETERS) ×2 IMPLANT
TUBING INSUFFLATION 10FT LAP (TUBING) ×2 IMPLANT
UNDERPAD 30X30 INCONTINENT (UNDERPADS AND DIAPERS) ×2 IMPLANT
WATER STERILE IRR 1000ML POUR (IV SOLUTION) ×4 IMPLANT

## 2011-04-06 NOTE — Op Note (Signed)
Kurt Richard, Kurt Richard               ACCOUNT NO.:  [id number]  MEDICAL RECORD NO.:  [id number]  LOCATION:  2301                         FACILITY:  [facility]  PHYSICIAN:  Salvatore Decent. 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  POSTOPERATIVE DIAGNOSES:  Critical left main and three-vessel coronary artery disease with ST-elevation myocardial infarction.  OPERATION:  Emergency median sternotomy, extracorporeal circulation, coronary artery bypass grafting x5 (left internal mammary artery to left anterior descending, saphenous vein graft to first diagonal, saphenous vein graft to first obtuse marginal, sequential saphenous vein graft to posterior descending and posterior lateral), endoscopic vein harvest right leg and left thigh.  SURGEON:  Salvatore Decent. Dorris Fetch, MD  ASSISTANT:  Rowe Clack, PA-C  ANESTHESIA:  General.  FINDINGS:  Diffusely diseased coronaries, fair quality target vessels, LAD was graftable, significant anterior wall scar and marked edema in the anterior wall.  Transesophageal echocardiography revealed severe left ventricular dysfunction with trace to 1+ mitral regurgitation, prebypass improved left ventricular wall motion, 1 to 2+ mitral regurgitation post bypass.  Vein from right lower leg not usable.  All vein used good quality.  CLINICAL NOTE:  [name] is a 66 year old gentleman who presented today with chest pain and shortness of breath.  He had been having ongoing pain for approximately 48-72 hours prior to presentation.  EKG showed ST elevation.  The patient was taken urgently to the cardiac catheterization laboratory where catheterization was performed.  He was found to have a critical left main stenosis with thrombus with  near complete occlusion of the left main.  He had severe three-vessel coronary artery disease.  He had severely impaired left ventricular function with ejection fraction estimated at 20-25%.  Intraaortic balloon pump was placed with improvement in the patient's chest pain. He was advised to undergo emergency coronary artery bypass grafting. This was discussed with the patient as well as his significant other. The indications risks, benefits, and alternatives were discussed.  The patient understood the high-risk nature of the procedure and agreed to proceed.  OPERATIVE NOTE:  [name] was brought to the operating room on April 06, 2011. There the anesthesia service placed a Swan-Ganz catheter and arterial blood pressure monitoring line.  Intravenous antibiotics were administered.  He was anesthetized and intubated.  Transesophageal echocardiography was performed.  Findings as previously noted.  There was severe left ventricular dysfunction.  The patient's PA pressures were markedly elevated as well in the 50-60 range systolic and 30-40 range diastolic.  The chest, abdomen and legs were prepped and draped in usual sterile fashion.  A median sternotomy was performed. Simultaneously incision made in the medial aspect of the right leg at the level of the knee.  The greater saphenous vein was identified and was harvested endoscopically from mid calf to groin.  The left internal mammary artery was harvested using standard technique.  A 2000 units of heparin was administered during the vessel harvest.  After the vein had been harvested, it was clear that the portion from  below the knee was too small to utilize as a graft.  Therefore additional vein was harvested from the left thigh.  This was of good quality as was the vein that was used from the right thigh.  The patient was fully heparinized.  The pericardium was opened.  The ascending aorta was inspected after confirming adequate  anticoagulation with ACT measurement.  The aorta was cannulated via concentric 2-0 Ethibond pledgeted pursestring sutures.  A dual staged venous cannula was placed via pursestring suture in the right atrial appendage.  Cardiopulmonary bypass was instituted.  The patient was cooled to 32 degrees Celsius. The coronary arteries were inspected and anastomotic sites were chosen. The conduits were inspected and cut to length.  A foam pad was placed in the pericardium to insulate the heart and protect left phrenic nerve. Temperature probe was placed in the myocardial septum.  The retrograde cardioplegic cannula was placed via pursestring suture in the right atrium and directed in the coronary sinus.  An antegrade cardioplegic cannula placed in the ascending aorta.  The aorta was crossclamped.  The left ventricle was emptied via the aortic root vent.  Cardiac arrest then was achieved with combination of cold antegrade and retrograde blood cardioplegia and topical iced saline.  An initial 500 mL of cardioplegia was administered antegrade. There was a rapid diastolic arrest.  An additional 500 mL was administered retrograde.  There was myocardial septal cooling to less than 10 degrees Celsius.  The following distal anastomoses were performed.  First a reverse saphenous vein graft was placed sequentially to the posterior descending and posterolateral branches of the right coronary. This vessels were both large caliber, but thick walled with diffuse disease and fair quality targets.  The vein was of good quality.  A side- to-side anastomosis was performed to the posterior descending and end-to- side to the posterolateral.  All anastomoses were probed proximally and distally at their completion prior to tying the suture.  At the completion of each vein graft, cardioplegia was administered to assess flow as well as hemostasis at the anastomosis.  Next a reverse saphenous vein graft was placed  end-to-side to the first obtuse marginal.  This was a high anterolateral branch.  It was a 1.5 mm good quality superficially intramyocardial vessel.  The vein was anastomosed end-to-side with running 7-0 Prolene suture.  There was a good flow and good hemostasis with cardioplegia administration.  Next, a reverse saphenous vein graft was placed end-to-side to the first diagonal branch to the LAD.  This was a 1.5 mm thick walled diffusely diseased fair quality target vessel.  The vein was anastomosed end-to- side with a running 7-0 Prolene suture.  Next, the left internal mammary artery was brought through a window in the pericardium.  The distal end was beveled.  It was then anastomosed end-to-side to the distal LAD.  The LAD was a very diffusely diseased vessel, but a 1.5 mm probe did pass proximally and distally for good distance, but then 1 mm probe did pass distally all the way to the apex. The mammary was a 2 mm good quality conduit.  It was anastomosed end-to- side with a running 8-0 Prolene suture.  At the completion of the mammary to LAD anastomosis, bulldog clamp was briefly removed to inspect for hemostasis.  Immediate rapid septal rewarming was noted.  The bulldog clamp was replaced and the mammary pedicle was tacked to the epicardial surface of the heart with 6-0 Prolene sutures.  Additional cardioplegia was administered via  the retrograde cannula. Rewarming was begun.  The proximal vein graft anastomoses then performed to 4.5 mm punch aortotomies with running 6-0 Prolene sutures.  At the completion of final proximal anastomosis, the patient was placed in Trendelenburg position.  Lidocaine was administered.  The aortic root was de-aired, a warm dose of retrograde cardioplegia was administered. The bulldog clamp was then removed from the left mammary artery.  After de-airing the aortic root, the aortic crossclamp was removed.  The total crossclamp time was 95 minutes.  The  patient was spontaneously resumed a sinus rhythm, did not require defibrillation.  While rewarming was completed, all proximal and distal anastomoses were inspected for hemostasis and epicardial pacing wires were placed on the right ventricle and right atrium.  The patient was loaded with milrinone and a dopamine infusion was initiated at 5 mcg/kg per minute.  When the patient rewarmed to a core temperature of 37 degrees Celsius, he was weaned from cardiopulmonary bypass on the 1st attempt.  Total bypass time was 144 minutes.  He was on milrinone at 0.33 mcg/kg per minute and dopamine at 5 mcg/kg per minute and the intraaortic balloon pump was on at one-to-one at the time of separation from bypass.  Postbypass transesophageal echocardiography showed possibly slightly worsened mitral regurgitation, but was still mild-to-moderate, did not require intervention.  There was overall some improvement in left ventricular function which may be attributable to the inotropic support.  Test dose of protamine was administered and was well tolerated.  The atrial and aortic cannulae were removed.  The remainder of protamine was administered without incident.  Chest was irrigated with warm saline. Hemostasis was achieved and the pericardium could not be reapproximated. A left pleural and single mediastinal chest tubes were placed in a separate subcostal incision.  It should be noted that there were bilateral pleural effusions approximately 800 mL on each side that were evacuated.  The sternum was closed with a combination of single and double heavy gauge stainless steel wires.  The pectoralis fascia, subcutaneous tissue, and skin were closed in standard fashion.  All sponge, needle, and instrument counts were correct at the end of the procedure.  The patient was taken from the operating room to the surgical intensive care unit in critical but stable condition.     Salvatore Decent Dorris Fetch,  M.D.     SCH/MEDQ  D:  04/06/2011  T:  04/06/2011  Job:  [id number]

## 2011-04-06 NOTE — ED Provider Notes (Signed)
History     CSN: [id number]  Arrival date & time 04/06/11  4098   First MD Initiated Contact with Patient 04/06/11 [phone]      Chief Complaint  Patient presents with  . Shortness of Breath    (Consider location/radiation/quality/duration/timing/severity/associated sxs/prior treatment) Patient is a 66 y.o. male presenting with shortness of breath. The history is provided by the patient.  Shortness of Breath  Associated symptoms include shortness of breath.  He has been having dyspnea for the last 3 days. Symptoms are stable but severe. Dyspnea is worse when he lays down and slightly worse with exertion. Yesterday, he started having a slight dry cough. There's been some slight tightness in his chest but no chest pain or heaviness or pressure. He denies fever, chills, sweats and denies nausea or vomiting and myalgias. He's never had symptoms like this before. He's been off of medication for diabetes for years and does not been checking his blood sugars. However, he denies any polyuria and polydipsia. He states nothing makes his breathing any better. He has not noticed any palpitations.  Past Medical History  Diagnosis Date  . Diabetes mellitus   . Hypertension   . CHF (congestive heart failure)     No past surgical history on file.  No family history on file.  History  Substance Use Topics  . Smoking status: Current Everyday Smoker  . Smokeless tobacco: Not on file  . Alcohol Use: No      Review of Systems  Respiratory: Positive for shortness of breath.   All other systems reviewed and are negative.    Allergies  Cardizem  Home Medications   Current Outpatient Rx  Name Route Sig Dispense Refill  . ASPIRIN EC 81 MG PO TBEC Oral Take 81 mg by mouth daily.    [name] NABUMETONE 500 MG PO TABS Oral Take 500 mg by mouth daily as needed. For muscle pain.      BP 100/63  Pulse 130  Temp 97.4 F (36.3 C)  Resp 23  SpO2 95%  Physical Exam  Nursing note and vitals  reviewed.  66 year old male who appears moderately dyspneic at rest. Vital signs are significant for marked tachycardia with heart rate 1:30, and mild tachypnea with respiratory rate of 23. Oxygen saturation is 95% on room air which is normal. Head is normocephalic and atraumatic. PERRLA, EOMI. Oropharynx is clear. Neck is nontender and supple without adenopathy. There is JVD present. Back is nontender. Lungs have bibasilar rales which are very fine, worse on the left. No wheezes or rhonchi are heard. Heart is tachycardic and irregular without murmur. Abdomen is soft, flat, nontender without masses or hepatosplenomegaly. Extremities have no cyanosis or edema, full range of motion is present. Skin is warm and dry without rash. Neurologic: Mental status is normal, cranial nerves are intact, there no focal motor or sensory deficits.  ED Course  Procedures (including critical care time)  Results for orders placed during the hospital encounter of 04/06/11  GLUCOSE, CAPILLARY      Component Value Range   Glucose-Capillary 207 (*) 70 - 99 (mg/dL)  CBC      Component Value Range   WBC 14.9 (*) 4.0 - 10.5 (K/uL)   RBC 4.53  4.22 - 5.81 (MIL/uL)   Hemoglobin 14.5  13.0 - 17.0 (g/dL)   HCT 47.8  29.5 - 62.1 (%)   MCV 88.7  78.0 - 100.0 (fL)   MCH 32.0  26.0 - 34.0 (pg)   MCHC 36.1 (*)  30.0 - 36.0 (g/dL)   RDW 16.1  09.6 - 04.5 (%)   Platelets 299  150 - 400 (K/uL)  DIFFERENTIAL      Component Value Range   Neutrophils Relative 82 (*) 43 - 77 (%)   Neutro Abs 12.2 (*) 1.7 - 7.7 (K/uL)   Lymphocytes Relative 8 (*) 12 - 46 (%)   Lymphs Abs 1.2  0.7 - 4.0 (K/uL)   Monocytes Relative 10  3 - 12 (%)   Monocytes Absolute 1.4 (*) 0.1 - 1.0 (K/uL)   Eosinophils Relative 0  0 - 5 (%)   Eosinophils Absolute 0.0  0.0 - 0.7 (K/uL)   Basophils Relative 0  0 - 1 (%)   Basophils Absolute 0.0  0.0 - 0.1 (K/uL)  COMPREHENSIVE METABOLIC PANEL      Component Value Range   Sodium 131 (*) 135 - 145 (mEq/L)    Potassium 4.2  3.5 - 5.1 (mEq/L)   Chloride 96  96 - 112 (mEq/L)   CO2 17 (*) 19 - 32 (mEq/L)   Glucose, Bld 252 (*) 70 - 99 (mg/dL)   BUN 26 (*) 6 - 23 (mg/dL)   Creatinine, Ser 4.09  0.50 - 1.35 (mg/dL)   Calcium 9.4  8.4 - 81.1 (mg/dL)   Total Protein 7.3  6.0 - 8.3 (g/dL)   Albumin 3.3 (*) 3.5 - 5.2 (g/dL)   AST 40 (*) 0 - 37 (U/L)   ALT 11  0 - 53 (U/L)   Alkaline Phosphatase 84  39 - 117 (U/L)   Total Bilirubin 1.2  0.3 - 1.2 (mg/dL)   GFR calc non Af Amer 57 (*) >90 (mL/min)   GFR calc Af Amer 66 (*) >90 (mL/min)  D-DIMER, QUANTITATIVE      Component Value Range   D-Dimer, Quant 1.43 (*) 0.00 - 0.48 (ug/mL-FEU)  TYPE AND SCREEN      Component Value Range   ABO/RH(D) O POS     Antibody Screen PENDING     Sample Expiration 04/09/2011     Dg Chest Portable 1 View  04/06/2011  *RADIOLOGY REPORT*  Clinical Data: 66 year old male with severe shortness of breath.  PORTABLE CHEST - 1 VIEW  Comparison: None.  Findings: Seated AP portable views of the chest at 0920 hours.  The diffuse basilar predominant increased interstitial markings. Indistinctness of pulmonary vasculature.  Cardiac size mildly enlarged. Other mediastinal contours are within normal limits. Questionable 14 mm nodule in the right upper lobe, seen only on a single view.  No pneumothorax.  No large effusion.  Previous cervical spine ACDF.  IMPRESSION: 1.  Lung findings most suggestive of acute pulmonary edema.  Severe viral / atypical pneumonia is the main differential. 2.  Probably artifactual vague 14 mm right upper lung nodule seen only on one of the two views.  Attention on follow-up.  Original Report Authenticated By: Harley Hallmark, M.D.      Date: 04/06/2011 0844  Rate: 124  Rhythm: ventricular tachycardia  QRS Axis: left  Intervals: normal  ST/T Wave abnormalities: Isolated ST elevation in lead V2 with questionable J-point elevation in V3. Inverted T waves present in the lateral leads  Conduction  Disutrbances:none  Narrative Interpretation: Q waves in V1, V2, and poor R-wave progression suggesting old anterior wall myocardial infarction. ST elevation J-point elevation are most likely related to old myocardial infarction. No old ECG available for comparison.  Old EKG Reviewed: none available   Date: 04/06/2011 0926  Rate: 124  Rhythm: sinus tachycardia  QRS Axis: left  Intervals: normal  ST/T Wave abnormalities: 2. elevation in leads V2 and V3 with ST segment upsloping, ST depression in V6, inverted T waves in leads 1 and aVL  Conduction Disutrbances:none  Narrative Interpretation: Compared with ECG earlier today, findings again are most consistent with a completed anterior wall myocardial infarction. New ST depression is seen in V6, however, there is significant R-wave present which was not present before suggesting there is a difference in lead placement. Overall picture is most consistent with anterior and/or anteroseptal myocardial infarction which is several days old  Old EKG Reviewed: changes noted  ECG did not meet strict criteria for code STEMI, but was concerning. Cardiology consultation was obtained to evaluate the ECG and Dr. Elease Hashimoto has come to the emergency department and agrees with ECG is not completely diagnostic of but concerning enough to warrant calling a code STEMI. He was given aspirin and heparin and the cath team has come to do emergency cardiac catheterization and possible PCI. Delay in calling code STEMI was related to atypical symptoms, and nondiagnostic but suspicious ECG.   1. Tobacco abuse   2. Hyperlipidemia   3. Hypertension   4. Dyspnea   5. Chest discomfort   6. Tachycardia     CRITICAL CARE Performed by: WUJWJ,XBJYN   Total critical care time: 60 minutes  Critical care time was exclusive of separately billable procedures and treating other patients.  Critical care was necessary to treat or prevent imminent or life-threatening  deterioration.  Critical care was time spent personally by me on the following activities: development of treatment plan with patient and/or surrogate as well as nursing, discussions with consultants, evaluation of patient's response to treatment, examination of patient, obtaining history from patient or surrogate, ordering and performing treatments and interventions, ordering and review of laboratory studies, ordering and review of radiographic studies, pulse oximetry and re-evaluation of patient's condition.   MDM  Dyspnea etiology unclear. Consider congestive heart failure, consider myocardial ischemia, consider pulmonary embolism.        Dione Booze, MD 04/06/11 1100

## 2011-04-06 NOTE — Transfer of Care (Signed)
Immediate Anesthesia Transfer of Care Note  Patient: Kurt Richard  Procedure(s) Performed: Procedure(s) (LRB): CORONARY ARTERY BYPASS GRAFTING (CABG) (N/A)  Patient Location: PACU and SICU  Anesthesia Type: General  Level of Consciousness: sedated and pateint uncooperative  Airway & Oxygen Therapy: Patient remains intubated per anesthesia plan  Post-op Assessment: Post -op Vital signs reviewed and stable  Post vital signs: stable  Complications: No apparent anesthesia complications

## 2011-04-06 NOTE — Anesthesia Procedure Notes (Signed)
Procedure Name: Intubation Date/Time: 04/06/2011 11:27 AM Performed by: Ellin Goodie Pre-anesthesia Checklist: Patient identified, Emergency Drugs available, Suction available, Patient being monitored and Timeout performed Patient Re-evaluated:Patient Re-evaluated prior to inductionOxygen Delivery Method: Circle system utilized Preoxygenation: Pre-oxygenation with 100% oxygen Intubation Type: IV induction Ventilation: Mask ventilation without difficulty Laryngoscope Size: Mac and 3 Grade View: Grade II Tube type: Oral Tube size: 8.0 mm Number of attempts: 1 Airway Equipment and Method: Stylet Placement Confirmation: ETT inserted through vocal cords under direct vision,  positive ETCO2 and breath sounds checked- equal and bilateral Secured at: 23 cm Tube secured with: Tape Dental Injury: Teeth and Oropharynx as per pre-operative assessment

## 2011-04-06 NOTE — OR Nursing (Signed)
SICU called at1620 - first call - 45 min

## 2011-04-06 NOTE — Brief Op Note (Addendum)
                   [address]            [place] [address]          [phone]    04/06/2011  3:15 PM  PATIENT:  [name]  66 y.o. male  PRE-OPERATIVE DIAGNOSIS:  SEVERE CAD  POST-OPERATIVE DIAGNOSIS:  SAME  PROCEDURE:  Procedure(s):EMERGENCY CORONARY ARTERY BYPASS GRAFTING (CABG)X5 (LIMA-LAD; SEQSVG-PD-PL; SVG-DIAG; SVG-OM) EVH RIGHT LEG AND LEFT THIGH  SURGEON:  Surgeon(s): Loreli Slot, MD  PHYSICIAN ASSISTANT: WAYNE GOLD PA-C  ANESTHESIA:   general  PATIENT CONDITION:  ICU - intubated and hemodynamically stable.  PRE-OPERATIVE WEIGHT: 73kg  COMPLICATIONS: NO KNOWN   XC 95 min  CPB 144 min  Off CPB with Dopamine @ 5 mcg/kg/min, Milrinone @ 0.33 mcg/kg/mmin and IABP @ 1:1

## 2011-04-06 NOTE — ED Notes (Signed)
Sob and cp that started on Sunday started w/ cough yesterday

## 2011-04-06 NOTE — Consult Note (Signed)
Reason for Consult:Critical left main disease  Referring Physician: Dr. Raylene Everts is an 66 y.o. male.  HPI: 66 yo male with multiple CRF presents with several day history of chest discomfort. Came to ED today when he began having difficulty breathing. In ED EKG showed STEMI, taken urgently to cath lab. Cath critical left main with thrombus as well as 3 vessel CAD.  Past Medical History  Diagnosis Date  . Diabetes mellitus   . Hypertension   . CHF (congestive heart failure)   . Hyperlipidemia   . Tobacco abuse     No past surgical history on file.  Family History  Problem Relation Age of Onset  . Heart attack Mother     ?67s  . Heart attack Sister     77s    Social History:  reports that he has been smoking Cigarettes.  He has been smoking about .5 packs per day. He does not have any smokeless tobacco history on file. He reports that he does not drink alcohol or use illicit drugs.  Allergies:  Allergies  Allergen Reactions  . Cardizem Hives  . Nifedipine Swelling    Medications:  I have reviewed the patient's current medications. Prior to Admission:  Prescriptions prior to admission  Medication Sig Dispense Refill  . aspirin EC 81 MG tablet Take 81 mg by mouth daily.      . nabumetone (RELAFEN) 500 MG tablet Take 500 mg by mouth daily as needed. For muscle pain.        Results for orders placed during the hospital encounter of 04/06/11 (from the past 48 hour(s))  GLUCOSE, CAPILLARY     Status: Abnormal   Collection Time   04/06/11  8:45 AM      Component Value Range Comment   Glucose-Capillary 207 (*) 70 - 99 (mg/dL)   CBC     Status: Abnormal   Collection Time   04/06/11  9:31 AM      Component Value Range Comment   WBC 14.9 (*) 4.0 - 10.5 (K/uL)    RBC 4.53  4.22 - 5.81 (MIL/uL)    Hemoglobin 14.5  13.0 - 17.0 (g/dL)    HCT 40.9  81.1 - 91.4 (%)    MCV 88.7  78.0 - 100.0 (fL)    MCH 32.0  26.0 - 34.0 (pg)    MCHC 36.1 (*) 30.0 - 36.0 (g/dL)    RDW 78.2  95.6 - 21.3 (%)    Platelets 299  150 - 400 (K/uL)   DIFFERENTIAL     Status: Abnormal   Collection Time   04/06/11  9:31 AM      Component Value Range Comment   Neutrophils Relative 82 (*) 43 - 77 (%)    Neutro Abs 12.2 (*) 1.7 - 7.7 (K/uL)    Lymphocytes Relative 8 (*) 12 - 46 (%)    Lymphs Abs 1.2  0.7 - 4.0 (K/uL)    Monocytes Relative 10  3 - 12 (%)    Monocytes Absolute 1.4 (*) 0.1 - 1.0 (K/uL)    Eosinophils Relative 0  0 - 5 (%)    Eosinophils Absolute 0.0  0.0 - 0.7 (K/uL)    Basophils Relative 0  0 - 1 (%)    Basophils Absolute 0.0  0.0 - 0.1 (K/uL)   COMPREHENSIVE METABOLIC PANEL     Status: Abnormal   Collection Time   04/06/11  9:31 AM      Component Value Range  Comment   Sodium 131 (*) 135 - 145 (mEq/L)    Potassium 4.2  3.5 - 5.1 (mEq/L)    Chloride 96  96 - 112 (mEq/L)    CO2 17 (*) 19 - 32 (mEq/L)    Glucose, Bld 252 (*) 70 - 99 (mg/dL)    BUN 26 (*) 6 - 23 (mg/dL)    Creatinine, Ser 4.09  0.50 - 1.35 (mg/dL)    Calcium 9.4  8.4 - 10.5 (mg/dL)    Total Protein 7.3  6.0 - 8.3 (g/dL)    Albumin 3.3 (*) 3.5 - 5.2 (g/dL)    AST 40 (*) 0 - 37 (U/L)    ALT 11  0 - 53 (U/L)    Alkaline Phosphatase 84  39 - 117 (U/L)    Total Bilirubin 1.2  0.3 - 1.2 (mg/dL)    GFR calc non Af Amer 57 (*) >90 (mL/min)    GFR calc Af Amer 66 (*) >90 (mL/min)   D-DIMER, QUANTITATIVE     Status: Abnormal   Collection Time   04/06/11  9:31 AM      Component Value Range Comment   D-Dimer, Quant 1.43 (*) 0.00 - 0.48 (ug/mL-FEU)   TYPE AND SCREEN     Status: Normal (Preliminary result)   Collection Time   04/06/11 10:16 AM      Component Value Range Comment   ABO/RH(D) O POS      Antibody Screen NEG      Sample Expiration 04/09/2011      Unit Number [id number]      Blood Component Type RED CELLS,LR      Unit division 00      Status of Unit ALLOCATED      Transfusion Status OK TO TRANSFUSE      Crossmatch Result Compatible      Unit Number [id number]      Blood Component Type  RED CELLS,LR      Unit division 00      Status of Unit ALLOCATED      Transfusion Status OK TO TRANSFUSE      Crossmatch Result Compatible      Unit Number [id number]      Blood Component Type RED CELLS,LR      Unit division 00      Status of Unit ALLOCATED      Transfusion Status OK TO TRANSFUSE      Crossmatch Result Compatible      Unit Number [id number]      Blood Component Type RED CELLS,LR      Unit division 00      Status of Unit ALLOCATED      Transfusion Status OK TO TRANSFUSE      Crossmatch Result Compatible     ABO/RH     Status: Normal   Collection Time   04/06/11 10:16 AM      Component Value Range Comment   ABO/RH(D) O POS       Dg Chest Portable 1 View  04/06/2011  *RADIOLOGY REPORT*  Clinical Data: 66 year old male with severe shortness of breath.  PORTABLE CHEST - 1 VIEW  Comparison: None.  Findings: Seated AP portable views of the chest at 0920 hours.  The diffuse basilar predominant increased interstitial markings. Indistinctness of pulmonary vasculature.  Cardiac size mildly enlarged. Other mediastinal contours are within normal limits. Questionable 14 mm nodule in the right upper lobe, seen only on a single view.  No pneumothorax.  No large effusion.  Previous cervical spine ACDF.  IMPRESSION: 1.  Lung findings most suggestive of acute pulmonary edema.  Severe viral / atypical pneumonia is the main differential. 2.  Probably artifactual vague 14 mm right upper lung nodule seen only on one of the two views.  Attention on follow-up.  Original Report Authenticated By: Harley Hallmark, M.D.    Review of Systems  Unable to perform ROS  Blood pressure 100/63, pulse 38, temperature 97.4 F (36.3 C), resp. rate 23, height 5\' 8"  (1.727 m), weight 160 lb 15 oz (73 kg), SpO2 95.00%. Physical Exam  Vitals reviewed. Constitutional: He appears well-developed and well-nourished. He appears distressed.  HENT:  Head: Normocephalic and atraumatic.  Neck: No thyromegaly present.        No bruits  Cardiovascular: Regular rhythm, normal heart sounds and intact distal pulses.  Exam reveals no gallop and no friction rub.   No murmur heard.      tachy  Respiratory:       Rhonchi bilaterally  Musculoskeletal: He exhibits no edema.       IABP in place right groin  Lymphadenopathy:    He has no cervical adenopathy.  Skin:       Cool and dry    Assessment/Plan: 66 yo with ongoing MI complicated by CHF +/- shock. Needs emergency CABG. OR notified. I discussed separately with the patient and his partner of 40 years the indications, risks, benefits and alternatives. They understand the serious nature of his problem and the high risk nature of surgery. They understand risks include but are not limited to death, stroke, MI, bleeding, need for transfusion, infection, other organ system dysfunction such as renal or respiratory failure.  Kauri Garson C 04/06/2011, 11:23 AM

## 2011-04-06 NOTE — Preoperative (Signed)
Beta Blockers   Reason not to administer Beta Blockers:Not Applicable 

## 2011-04-06 NOTE — CV Procedure (Addendum)
Cardiac Catheterization Procedure Note  Name: Kurt Richard MRN: [id number] DOB: 07-13-1945  Procedure: Left Heart Cath, Selective Coronary Angiography, LV angiography, intra-aortic balloon pump placement.  Indication: Anterior ST elevation myocardial infarction with late presentation and early cardiogenic shock.   Medications:  Sedation:  1 mg IV Versed, 25 mcg IV Fentanyl  Contrast:  50 mL Omnipaque  Procedural details: The right groin was prepped, draped, and anesthetized with 1% lidocaine. Using modified Seldinger technique, a 6 French sheath was introduced into the right femoral artery. Standard Judkins catheters were used for coronary angiography and left ventriculography. Left main angiography was performed with an XB 3.5 guiding catheter Catheter exchanges were performed over a guidewire. There were no immediate procedural complications. At the end of the case, I decided to proceed with an intra-aortic balloon pump placement for hemodynamic support due to left main, three-vessel disease as well as early cardiogenic shock. It's [place] sheath was exchanged and a 7 French balloon pump sheath. The balloon was advanced over a wire and placed in the descending aorta just distal to the origin of the left subclavian artery. Abdominal aortogram was performed before that with a hand injection.  Procedural Findings:  Hemodynamics: AO:  92/60   mmHg LV:  93/23    mmHg LVEDP: 33  mmHg  Coronary angiography: Coronary dominance: Right   Left Main:  There is a 95% hazy stenosis in the ostial left main suggestive of a thrombus.  Left Anterior Descending (LAD):  There is 40% diffuse stenosis in the midsegment after the first septal perforator. The vessel is occluded in the midsegment after giving the second septal branch and a large diagonal branch. There no significant collaterals going to the distal LAD. There is very faint collaterals supplying the apical LAD by the right coronary artery.  1st  diagonal (D1):  Large branch that gives to medium size branches which seems to be appropriate for bypass.  Circumflex (LCx):  The vessel is medium in size and nondominant. There is an 80% ostial stenosis.  1st obtuse marginal:  Very small in size.  2nd obtuse marginal:  Small in size.  3rd obtuse marginal:  Medium in size with moderate atherosclerosis.   Ramus Intermedius:  Normal in size with 90% diffuse disease in the proximal and mid segment. It's free of significant disease distally.  Right Coronary Artery: Normal in size and dominant. There is a 60-70% discrete stenosis proximally. The mid-vessel has a diffuse 40-50% disease. There is a 40% stenosis distally before the bifurcation.  posterior descending artery: Normal in size with mild atherosclerosis but no evidence of obstructive disease.  posterior lateral branch:  The first PL is small in size. The second PL is large in size with 60% proximal disease. The third PL is normal in size with mild atherosclerosis. Left ventriculography: Left ventricular systolic function is severely reduced , LVEF is estimated at 15 %, there is mild  mitral regurgitation which might be underestimated. There is akinesis of the mid/distal anterior and distal inferior walls Dyskinesis of the apex.  Final Conclusions:   1. Acute anterior myocardial infarction with late presentation. 2. Severe left main and three-vessel coronary artery disease. The LAD is occluded with very little collaterals. 3. Severely reduced LV systolic function. 4. Severely elevated left ventricular end-diastolic pressure and borderline hypotension likely due to early cardiogenic shock. 5. Intra-aortic balloon placement.  Recommendations: Emergent CABG. This was discussed with the patient and Dr. Dorris Fetch who accepted doing emergent CABG. Obviously, this would be  a very high-risk surgery but it's likely the best option at this time.  Lorine Bears MD, [facility] 04/06/2011, 10:36 AM

## 2011-04-06 NOTE — Anesthesia Postprocedure Evaluation (Signed)
  Anesthesia Post-op Note  Patient: Kurt Richard  Procedure(s) Performed: Procedure(s) (LRB): CORONARY ARTERY BYPASS GRAFTING (CABG) (N/A)  Patient Location: PACU and SICU  Anesthesia Type: General  Level of Consciousness: sedated  Airway and Oxygen Therapy: Patient remains intubated per anesthesia plan  Post-op Pain: none  Post-op Assessment: Post-op Vital signs reviewed  Post-op Vital Signs: stable  Complications: No apparent anesthesia complications

## 2011-04-06 NOTE — Progress Notes (Signed)
Patient Kurt Richard, 66 year old white male: arrived at E.D. with chest pains; was moved to Cath lab; diagnosed with artery blockage and major heart damage; and then moved to the operating room for surgery.  Chaplain provided pastoral presence, prayer, and conversation to patient's life partner Travon Crochet. I will follow-up as needed.

## 2011-04-06 NOTE — ED Notes (Signed)
States not on any meds for diabetes ran out and then did not see a dr for it

## 2011-04-06 NOTE — Progress Notes (Signed)
Patient status post emergency CABG x4 with preop balloon pump  Postop sinus tachycardia with stable blood pressure and cardiac index 2.0  Balloon pump in right leg with non-Doppler pulse but foot mildly cool, faint Doppler pulse on left foot. Balloon pump sheath pulled out 7 cm in order to reduce femoral obstruction. Will monitor but patient needs balloon pump for his post CABG support following acute stemi. Chest x-ray shows balloon pump in excellent position.

## 2011-04-06 NOTE — Anesthesia Preprocedure Evaluation (Signed)
Anesthesia Evaluation  Patient identified by MRN, date of birth, ID band Patient awake    Reviewed: Allergy & Precautions, H&P , NPO status , Patient's Chart, lab work & pertinent test results  Airway       Dental   Pulmonary          Cardiovascular hypertension, Pt. on medications + Past MI and +CHF     Neuro/Psych    GI/Hepatic   Endo/Other  Diabetes mellitus-, Type 2  Renal/GU      Musculoskeletal   Abdominal   Peds  Hematology   Anesthesia Other Findings   Reproductive/Obstetrics                           Anesthesia Physical Anesthesia Plan  ASA: IV and Emergent  Anesthesia Plan: General   Post-op Pain Management:    Induction: Intravenous  Airway Management Planned: Oral ETT  Additional Equipment: Arterial line, PA Cath and TEE  Intra-op Plan:   Post-operative Plan: Post-operative intubation/ventilation  Informed Consent: I have reviewed the patients History and Physical, chart, labs and discussed the procedure including the risks, benefits and alternatives for the proposed anesthesia with the patient or authorized representative who has indicated his/her understanding and acceptance.     Plan Discussed with: CRNA and Surgeon  Anesthesia Plan Comments:         Anesthesia Quick Evaluation

## 2011-04-06 NOTE — Progress Notes (Signed)
ANTICOAGULATION CONSULT NOTE - Initial Consult  Pharmacy Consult for heparin Indication: chest pain/ACS  Allergies  Allergen Reactions  . Cardizem Hives  . Nifedipine Swelling    Patient Measurements: Height: 5\' 8"  (172.7 cm) Weight: 160 lb 15 oz (73 kg) IBW/kg (Calculated) : 68.4  Heparin Dosing Weight: 70kg  Vital Signs: Temp: 97.4 F (36.3 C) (03/05 0836) BP: 100/63 mmHg (03/05 0836) Pulse Rate: 130  (03/05 0836)  Labs:  Basename 04/06/11 0931  HGB 14.5  HCT 40.2  PLT 299  APTT --  LABPROT --  INR --  HEPARINUNFRC --  CREATININE 1.29  CKTOTAL --  CKMB --  TROPONINI --   Estimated Creatinine Clearance: 56 ml/min (by C-G formula based on Cr of 1.29).  Medical History: Past Medical History  Diagnosis Date  . Diabetes mellitus   . Hypertension   . CHF (congestive heart failure)   . Hyperlipidemia   . Tobacco abuse      Assessment: 66 yo male with cp x 2 days presents to [facility] as code stemi, taken to cath lab found to have occluded left main, IABP placed and CVTS consulted will take emergently to cath, heparin gtt started in cath lab for IABP, CBC within normal range, no anticoagulants pta.ASA given in ED Goal of Therapy:  Heparin level 0.3-0.5-will not need d/t urgent surgery   Plan:  Heparin bolus of 3k units Heparin gtt at 850-->emergent OHS Follow up post op  Severiano Gilbert 04/06/2011,10:23 AM

## 2011-04-06 NOTE — H&P (Signed)
History and Physical  Patient ID: Kurt Richard MRN: [id number], SOB: 1945-12-14 66 y.o. Date of Encounter: 04/06/2011, 9:58 AM  Primary Physician: Sheila Oats, MD, MD Primary Cardiologist: None  Chief Complaint: Shortness of breath and chest pain  HPI: 66 y.o. male w/ PMHx significant for tobacco abuse, HTN, HLD, and DMII who presented to [facility] on 04/06/2011 with complaints of shortness of breath and chest pain.  He reports having progressive sob over the last couple of days that worsened two days ago and also had some mild chest tightness. He also reports doe,orthopnea, and nonproductive cough. He has never had this chest discomfort or dyspnea in the past. He is not active, smokes ~ 1/2 ppd, and does not take any medications on a daily basis.   In the ED, EKG revealed sinus tachycardia 124bpm w/ ST elevation V2 & V3, T wave inversions lateral leads, Q waves V1, V2 and poor R wave progression. CXR revealed acute pulmonary edema vs viral/atypical PNA and a vague 14mm RUL nodule. Cardiac enzymes are pending. He was chest pain free. A code STEMI was called and he was taken to the cath lab for emergent cardiac catheterization.   Past Medical History  Diagnosis Date  . Diabetes mellitus   . Hypertension   . CHF (congestive heart failure)   . Hyperlipidemia   . Tobacco abuse      Surgical History: Unable to obtain as he was taken to cath lab.  Home Meds: Medication Sig  nabumetone (RELAFEN) 500 MG tablet Take 500 mg by mouth daily as needed. For muscle pain.   Allergies:  Allergen Reactions  . Cardizem Hives  . Nifedipine Swelling   Social History  . Marital Status: Single   Occupational History  . Retired Radiation protection practitioner   Social History Main Topics  . Smoking status: Current Everyday Smoker -- 0.5 packs/day    Types: Cigarettes  . Alcohol Use: No  . Drug Use: No   Family History  Problem Relation Age of Onset  . Heart attack Mother     ?40s  . Heart  attack Sister     59s    Review of Systems: General: negative for chills, fever, night sweats or weight changes.  Cardiovascular: As per HPI Dermatological: negative for rash Respiratory: (+) cough; negative for wheezing Urologic: negative for hematuria Abdominal: negative for nausea, vomiting, diarrhea, bright red blood per rectum, melena, or hematemesis Neurologic: negative for visual changes, syncope, or dizziness All other systems reviewed and are otherwise negative except as noted above.  Labs:   Pending  Radiology/Studies:   04/06/2011 - CXR   Findings: Seated AP portable views of the chest at 0920 hours.  The diffuse basilar predominant increased interstitial markings. Indistinctness of pulmonary vasculature.  Cardiac size mildly enlarged. Other mediastinal contours are within normal limits. Questionable 14 mm nodule in the right upper lobe, seen only on a single view.  No pneumothorax.  No large effusion.  Previous cervical spine ACDF.  IMPRESSION: 1.  Lung findings most suggestive of acute pulmonary edema.  Severe viral / atypical pneumonia is the main differential. 2.  Probably artifactual vague 14 mm right upper lung nodule seen only on one of the two views.  Attention on follow-up.      EKG: Sinus tachycardia 124bpm w/ ST elevation V2 & V3, T wave inversions lateral leads, Q waves V1, V2 and poor R wave progression  Physical Exam: Blood pressure 100/63, pulse 130, temperature 97.4 F (36.3 C), resp.  rate 23, SpO2 95.00%. General: Well developed, well nourished, white male, in no acute distress. Head: Normocephalic, atraumatic, sclera non-icteric, nares are without discharge Neck: Supple. Negative for carotid bruits. JVD not elevated. Lungs: Diminished throughout, Scattered expiratory wheezes. No rales, or rhonchi. Breathing is unlabored. Heart: Tachycardic,  Regular rhythm with S1 S2. No murmurs, rubs, or gallops appreciated. Abdomen: Soft, non-tender, non-distended with  normoactive bowel sounds. No rebound/guarding. No obvious abdominal masses. Msk:  Strength and tone appear normal for age. Extremities: Trace BLE edema. No clubbing or cyanosis. Distal pedal pulses are 2+ and equal bilaterally. Neuro: Alert and oriented X 3. Moves all extremities spontaneously. Psych:  Responds to questions appropriately with a normal affect.    ASSESSMENT AND PLAN:  66 y.o. male w/ PMHx significant for tobacco abuse, HTN, HLD, and DMII who presented to [facility] on 04/06/2011 with complaints of shortness of breath and chest pain.  1. STEMI: He has had ~3 days of progressive sob and mild chest tightness with EKG changes suggesting he had a MI in the past couple of days. He is chest pain free and hemodynamically stable. He received ASA and Heparin. He is currently undergoing emergent cardiac catheterization with further plans pending the findings. Will need to stress risk factor modification. Will check lipid panel and A1C as well as provide smoking cessation counseling. Place on 81mg  ASA, Metoprolol 12.5mg  BID, Atorvastatin 80mg .  2. HTN: He is not on any antihypertensives at home. BP currently soft. Will initiate Metoprolol 12.5mg  BID and cont to monitor.  3. HLD: He is not on any antihyperlipidemics at home. Lipid panel in the am. Initiate a statin and f/u LFTs.  4. DMII: He is not on any hypoglycemics at home. A1C pending. Monitor CBGs and place on SSI  5. Tobacco Abuse: Encourage cessation. Nicotine patch if needed. Tobacco cessation counseling.    Signed, HOPE, JESSICA PA-C 04/06/2011, 9:58 AM  Attending Note:   The patient was seen and examined.  Agree with assessment and plan as noted above.  Pt started having cough, chest tightness, dyspnea 3-4 days ago.  He now presents with persistent and worsening pain.  ECG suggests anterior septal MI .  He has TWI in lateral leads and has some ST depression in V6.  HR is 124.  BP 105/60.  Would like to give him some  Beta blocker but his BP is marginal at this point.    I suspect he had an anterior MI 3 days ago.  ASA  And heparin 3000 units given in ER.  Will not give Plavix since he may have 3 v CAD   Alvia Grove., MD, [facility] 04/06/2011, 10:20 AM

## 2011-04-06 NOTE — Plan of Care (Signed)
Problem: Consults Goal: Cardiac Surgery Patient Education ( See Patient Education module for education specifics.) Outcome: Not Applicable Date Met:  04/06/11 Pt was an emergent case

## 2011-04-07 ENCOUNTER — Inpatient Hospital Stay (HOSPITAL_COMMUNITY): Payer: 59

## 2011-04-07 LAB — POCT ACTIVATED CLOTTING TIME: Activated Clotting Time: 188 seconds

## 2011-04-07 LAB — LIPID PANEL
Cholesterol: 106 mg/dL (ref 0–200)
LDL Cholesterol: 63 mg/dL (ref 0–99)
Triglycerides: 78 mg/dL (ref <150)
VLDL: 16 mg/dL (ref 0–40)

## 2011-04-07 LAB — CREATININE, SERUM
Creatinine, Ser: 2.37 mg/dL — ABNORMAL HIGH (ref 0.50–1.35)
GFR calc Af Amer: 32 mL/min — ABNORMAL LOW (ref >90)
GFR calc non Af Amer: 27 mL/min — ABNORMAL LOW (ref >90)

## 2011-04-07 LAB — POCT I-STAT, CHEM 8
BUN: 29 mg/dL — ABNORMAL HIGH (ref 6–23)
Calcium, Ion: 1.25 mmol/L (ref 1.12–1.32)
Chloride: 105 mEq/L (ref 96–112)
Creatinine, Ser: 2.7 mg/dL — ABNORMAL HIGH (ref 0.50–1.35)
TCO2: 18 mmol/L (ref 0–100)

## 2011-04-07 LAB — CBC
Hemoglobin: 10 g/dL — ABNORMAL LOW (ref 13.0–17.0)
Platelets: 185 10*3/uL (ref 150–400)
RBC: 3.12 MIL/uL — ABNORMAL LOW (ref 4.22–5.81)
RBC: 2.85 MIL/uL — ABNORMAL LOW (ref 4.22–5.81)
RDW: 14.4 % (ref 11.5–15.5)
WBC: 17.2 10*3/uL — ABNORMAL HIGH (ref 4.0–10.5)

## 2011-04-07 LAB — POCT I-STAT 3, ART BLOOD GAS (G3+)
Bicarbonate: 17.1 mEq/L — ABNORMAL LOW (ref 20.0–24.0)
Bicarbonate: 17.3 mEq/L — ABNORMAL LOW (ref 20.0–24.0)
O2 Saturation: 99 %
O2 Saturation: 92 %
TCO2: 18 mmol/L (ref 0–100)
TCO2: 18 mmol/L (ref 0–100)
pCO2 arterial: 33.8 mmHg — ABNORMAL LOW (ref 35.0–45.0)
pH, Arterial: 7.318 — ABNORMAL LOW (ref 7.350–7.450)
pO2, Arterial: 177 mmHg — ABNORMAL HIGH (ref 80.0–100.0)
pO2, Arterial: 70 mmHg — ABNORMAL LOW (ref 80.0–100.0)

## 2011-04-07 LAB — BASIC METABOLIC PANEL
CO2: 17 mEq/L — ABNORMAL LOW (ref 19–32)
Glucose, Bld: 157 mg/dL — ABNORMAL HIGH (ref 70–99)
Potassium: 4.5 mEq/L (ref 3.5–5.1)
Sodium: 131 mEq/L — ABNORMAL LOW (ref 135–145)

## 2011-04-07 LAB — CARDIAC PANEL(CRET KIN+CKTOT+MB+TROPI)
CK, MB: 67.6 ng/mL (ref 0.3–4.0)
Relative Index: 5.4 — ABNORMAL HIGH (ref 0.0–2.5)
Total CK: 1253 U/L — ABNORMAL HIGH (ref 7–232)

## 2011-04-07 LAB — GLUCOSE, CAPILLARY
Glucose-Capillary: 165 mg/dL — ABNORMAL HIGH (ref 70–99)
Glucose-Capillary: 151 mg/dL — ABNORMAL HIGH (ref 70–99)

## 2011-04-07 LAB — MAGNESIUM: Magnesium: 2.7 mg/dL — ABNORMAL HIGH (ref 1.5–2.5)

## 2011-04-07 MED ORDER — MILRINONE IN DEXTROSE 200-5 MCG/ML-% IV SOLN
0.1250 ug/kg/min | INTRAVENOUS | Status: DC
  Filled 2011-04-07: qty 100

## 2011-04-07 MED ORDER — INSULIN GLARGINE 100 UNIT/ML ~~LOC~~ SOLN
25.0000 [IU] | Freq: Every day | SUBCUTANEOUS | Status: DC
  Administered 2011-04-07 – 2011-04-09 (×3): 25 [IU] via SUBCUTANEOUS
  Filled 2011-04-07: qty 3

## 2011-04-07 MED ORDER — SODIUM CHLORIDE 0.9 % IV BOLUS (SEPSIS)
Freq: Once | INTRAVENOUS | Status: AC
  Filled 2011-04-07: qty 10

## 2011-04-07 MED ORDER — INSULIN ASPART 100 UNIT/ML ~~LOC~~ SOLN
0.0000 [IU] | SUBCUTANEOUS | Status: DC
  Administered 2011-04-07 (×2): 2 [IU] via SUBCUTANEOUS
  Administered 2011-04-07 (×2): 4 [IU] via SUBCUTANEOUS
  Administered 2011-04-08: 2 [IU] via SUBCUTANEOUS

## 2011-04-07 MED ORDER — IPRATROPIUM-ALBUTEROL 18-103 MCG/ACT IN AERO
2.0000 | INHALATION_SPRAY | RESPIRATORY_TRACT | Status: DC
  Administered 2011-04-07 – 2011-04-08 (×8): 2 via RESPIRATORY_TRACT
  Filled 2011-04-07: qty 14.7

## 2011-04-07 MED ORDER — FUROSEMIDE 10 MG/ML IJ SOLN
8.0000 mg/h | INTRAVENOUS | Status: DC
  Administered 2011-04-07: 6 mg/h via INTRAVENOUS
  Administered 2011-04-09: 10 mg/h via INTRAVENOUS
  Administered 2011-04-10: 8 mg/h via INTRAVENOUS
  Filled 2011-04-07 (×7): qty 25

## 2011-04-07 MED ORDER — ALBUMIN HUMAN 5 % IV SOLN
12.5000 g | Freq: Once | INTRAVENOUS | Status: AC
  Administered 2011-04-07: 12.5 g via INTRAVENOUS
  Filled 2011-04-07: qty 250

## 2011-04-07 MED ORDER — CALCIUM CHLORIDE 10 % IV SOLN
1.0000 g | Freq: Once | INTRAVENOUS | Status: DC
  Administered 2011-04-07: 1 g via INTRAVENOUS

## 2011-04-07 MED ORDER — SODIUM BICARBONATE 8.4 % IV SOLN
50.0000 meq | Freq: Once | INTRAVENOUS | Status: AC
  Administered 2011-04-07: 50 meq via INTRAVENOUS
  Filled 2011-04-07: qty 50

## 2011-04-07 MED FILL — Magnesium Sulfate Inj 50%: INTRAMUSCULAR | Qty: 10 | Status: AC

## 2011-04-07 MED FILL — Heparin Sodium (Porcine) Inj 1000 Unit/ML: INTRAMUSCULAR | Qty: 10 | Status: AC

## 2011-04-07 MED FILL — Lactated Ringer's Solution: INTRAVENOUS | Qty: 500 | Status: AC

## 2011-04-07 MED FILL — Potassium Chloride Inj 2 mEq/ML: INTRAVENOUS | Qty: 40 | Status: AC

## 2011-04-07 MED FILL — Dexmedetomidine HCl IV Soln 200 MCG/2ML: INTRAVENOUS | Qty: 2 | Status: AC

## 2011-04-07 MED FILL — Nitroglycerin IV Soln 5 MG/ML: INTRAVENOUS | Qty: 10 | Status: AC

## 2011-04-07 MED FILL — Verapamil HCl IV Soln 2.5 MG/ML: INTRAVENOUS | Qty: 4 | Status: AC

## 2011-04-07 NOTE — Progress Notes (Signed)
Subjective:   Kurt Richard is a 66 yo admitted with an anterior wall STEMI.  Was found to have a 99% LM and other coronary artery stenosis.  Had emergent CABG.  Still on vent but awake.  On lots of pressors.     [name] acetaminophen (TYLENOL) oral liquid 160 mg/5 mL  650 mg Per Tube NOW   Or  . acetaminophen  650 mg Rectal NOW  . acetaminophen  1,000 mg Oral Q6H   Or  . acetaminophen (TYLENOL) oral liquid 160 mg/5 mL  975 mg Per Tube Q6H  . aspirin  324 mg Oral STAT  . aspirin  324 mg Oral Pre-Cath  . aspirin EC  325 mg Oral Daily   Or  . aspirin  324 mg Per Tube Daily  . atorvastatin  80 mg Oral q1800  . bisacodyl  10 mg Oral Daily   Or  . bisacodyl  10 mg Rectal Daily  . calcium chloride  1 g Intravenous Once  . cefUROXime (ZINACEF)  IV  1.5 g Intravenous To OR  . cefUROXime (ZINACEF)  IV  1.5 g Intravenous Q12H  . docusate sodium  200 mg Oral Daily  . famotidine (PEPCID) IV  20 mg Intravenous Q12H  . fentaNYL      . heparin      . heparin  60 Units/kg Intravenous STAT  . insulin aspart  0-24 Units Subcutaneous Q4H  . lidocaine      . magnesium sulfate      . magnesium sulfate  4 g Intravenous Once  . metoprolol tartrate  12.5 mg Oral BID   Or  . metoprolol tartrate  12.5 mg Per Tube BID  . midazolam      . nitroGLYCERIN      . nitroglycerin/verapamil/heparin/sodium bicarbonate solution irrigation for artery spasm   Irrigation Once  . pantoprazole  40 mg Oral Q1200  . potassium chloride  10 mEq Intravenous Q1 Hr x 3  . sodium bicarbonate  50 mEq Intravenous Once  . sodium chloride  3 mL Intravenous Q12H  . sodium chloride  3 mL Intravenous Q12H  . vancomycin (VANCOCIN) IVPB 1000 mg/100 mL central line  1,000 mg Intravenous Once  . vancomycin  1,250 mg Intravenous To OR  . DISCONTD: aminocaproic acid (AMICAR) for OHS   Intravenous To OR  . DISCONTD: aminocaproic acid (AMICAR) for OHS   Intravenous To OR  . DISCONTD: cefUROXime (ZINACEF)  IV  750 mg Intravenous To OR    . DISCONTD: dexmedetomidine (PRECEDEX) IV infusion for high rates  0.1-0.7 mcg/kg/hr Intravenous To OR  . DISCONTD: DOPamine  2-20 mcg/kg/min Intravenous To OR  . DISCONTD: epinephrine  0.5-20 mcg/min Intravenous To OR  . DISCONTD: heparin-papaverine-plasmalyte irrigation   Irrigation To OR  . DISCONTD: insulin (NOVOLIN-R) infusion   Intravenous To OR  . DISCONTD: insulin regular  0-10 Units Intravenous TID WC  . DISCONTD: magnesium sulfate  40 mEq Other To OR  . DISCONTD: nitroGLYCERIN  2-200 mcg/min Intravenous To OR  . DISCONTD: nitroglycerin/verapamil/heparin/sodium bicarbonate solution irrigation for artery spasm   Irrigation To OR  . DISCONTD: phenylephrine (NEO-SYNEPHRINE) Adult infusion  30-200 mcg/min Intravenous To OR  . DISCONTD: potassium chloride  80 mEq Other To OR      . sodium chloride 20 mL/hr at 04/06/11 1800  . sodium chloride    . sodium chloride 10 mL/hr at 04/06/11 1800  . sodium chloride 250 mL (04/07/11 0532)  . dexmedetomidine (PRECEDEX) IV  infusion 0.5 mcg/kg/hr (04/07/11 0700)  . DOPamine 3 mcg/kg/min (04/07/11 0700)  . insulin (NOVOLIN-R) infusion Stopped (04/06/11 2001)  . lactated ringers 20 mL/hr (04/06/11 1850)  . milrinone 0.25 mcg/kg/min (04/07/11 0700)  . nitroGLYCERIN Stopped (04/06/11 1800)  . phenylephrine (NEO-SYNEPHRINE) Adult infusion 65 mcg/min (04/07/11 0700)  . DISCONTD: sodium chloride 20 mL/hr at 04/06/11 0938  . DISCONTD: heparin      Objective:  Vital Signs in the last 24 hours: Blood pressure 109/67, pulse 110, temperature 100.2 F (37.9 C), temperature source Core (Comment), resp. rate 12, height 5\' 8"  (1.727 m), weight 191 lb 2.2 oz (86.7 kg), SpO2 100.00%. Temp:  [97.4 F (36.3 C)-101.1 F (38.4 C)] 100.2 F (37.9 C) (03/06 0700) Pulse Rate:  [38-130] 110  (03/06 0700) Resp:  [11-23] 12  (03/06 0700) BP: (69-115)/(39-69) 109/67 mmHg (03/06 0700) SpO2:  [95 %-100 %] 100 % (03/06 0700) Arterial Line BP: (81-109)/(32-49)  100/44 mmHg (03/06 0700) FiO2 (%):  [59.7 %-100 %] 59.7 % (03/06 0700) Weight:  [160 lb 15 oz (73 kg)-191 lb 2.2 oz (86.7 kg)] 191 lb 2.2 oz (86.7 kg) (03/06 0600)  Intake/Output from previous day: 03/05 0701 - 03/06 0700 In: 8295.6 [I.V.:4656.2; Blood:500; NG/GT:120; IV Piggyback:1660] Out: 2775 [Urine:685; Emesis/NG output:330; Blood:1600; Chest Tube:160] Intake/Output from this shift:    Physical Exam:  Physical Exam: Blood pressure 109/67, pulse 110, temperature 100.2 F (37.9 C), temperature source Core (Comment), resp. rate 12, height 5\' 8"  (1.727 m), weight 191 lb 2.2 oz (86.7 kg), SpO2 100.00%. General: on vent Head: Normocephalic, atraumatic, sclera non-icteric, mucus membranes are moist,  Neck: Supple. Normal carotids. No JVD Lungs: on vent Heart: Regular rate,  Tachy, IABP in Abdomen: Soft, non-tender, non-distended with normoactive bowel sounds. No hepatomegaly. No rebound/guarding. No abdominal masses. Msk:  Strength and tone appear normal for age. Extremities: No clubbing or cyanosis. No edema.  Neuro: Alert and oriented X 3. Moves all extremities spontaneously. Psych:  Responds to questions appropriately with a normal affect.    Lab Results:   Basename 04/07/11 0450 04/06/11 2232 04/06/11 2230 04/06/11 0931  NA 131* 133* -- --  K 4.5 4.4 -- --  CL 102 105 -- --  CO2 17* -- -- 17*  GLUCOSE 157* 124* -- --  BUN 28* 25* -- --  CREATININE 1.59* 1.60* -- --  CALCIUM 9.4 -- -- 9.4  MG 2.9* -- 3.0* --  PHOS -- -- -- --    Basename 04/06/11 0931  AST 40*  ALT 11  ALKPHOS 84  BILITOT 1.2  PROT 7.3  ALBUMIN 3.3*   No results found for this basename: LIPASE:2,AMYLASE:2 in the last 72 hours  Basename 04/07/11 0450 04/06/11 2232 04/06/11 2230 04/06/11 0931  WBC 14.1* -- 17.0* --  NEUTROABS -- -- -- 12.2*  HGB 10.0* 8.8* -- --  HCT 27.6* 26.0* -- --  MCV 88.5 -- 87.3 --  PLT 191 -- 195 --    Basename 04/06/11 2230  CKTOTAL --  CKMB --  TROPONINI  >25.00*   No components found with this basename: POCBNP:3  Basename 04/06/11 0931  DDIMER 1.43*   No results found for this basename: HGBA1C in the last 72 hours  Basename 04/07/11 0450  CHOL 106  HDL 27*  LDLCALC 63  TRIG 78  CHOLHDL 3.9   No results found for this basename: TSH,T4TOTAL,FREET3,T3FREE,THYROIDAB in the last 72 hours No results found for this basename: VITAMINB12,FOLATE,FERRITIN,TIBC,IRON,RETICCTPCT in the last 72 hours   Tele :  Sinus tach  Assessment/Plan:   1. CAD : s/p emergent CABG.  Progressing - still on lots of support.  The pressure line on the IABP has clotted.  We can see the augmented pressure on his artline.  PA pressures 41/24 BP 100/41 CO = 4.58   Disposition: continue supportive care.   [name], [name]., MD, [facility] 04/07/2011, 7:24 AM LOS: Day 1

## 2011-04-07 NOTE — Progress Notes (Signed)
Nursing  Report taken from off-going RN, Dr. Dorris Fetch at bedside and unable to doppler Right or Left pedal pulses, lower extremities cold. Told to have Dr. Donata Clay see patient asap.  IABP appears to be fuctioning well.  Dr. Donata Clay at bedside, attempted to flush Aline on IABP, unable to flush.  See note from MD.  Will leave IABP 1:1 and monitor (R) arterial and BP cuff pressures.   Dorette Grate RN

## 2011-04-07 NOTE — Procedures (Signed)
Extubation Procedure Note  Patient Details:   Name: Kurt Richard DOB: 10-Jul-1945 MRN: [id number]   Airway Documentation:     Evaluation  O2 sats: stable throughout and currently acceptable Complications: No apparent complications Patient did tolerate procedure well. Bilateral Breath Sounds: Clear   Yes Pt awake and alert. Pt extubated per MD order, ABG a littlle out of parameters, so2 92%. Positive cuff leak, NIF -20, VC 800, IS 1250, BBS cl, pt able to vocalize. Arloa Koh 04/07/2011, 11:12 AM

## 2011-04-07 NOTE — Progress Notes (Signed)
UR Completed.  Kurt Richard [phone] 04/07/2011   

## 2011-04-07 NOTE — Progress Notes (Signed)
CRITICAL VALUE ALERT  Critical value received:  Troponin >25   Date of notification:  04/06/11  Time of notification:  2326 pm    Critical value read back:yes  Nurse who received alert:  Cyndie Mull RN   MDs aware of elevated cardiac enzymes, pt was a Code Stemi, now s/p CABG.  This lab was ordered 04/06/11 at 0904 am, prior to pt being taken to the cath lab.  Lab order was discontinued on transfer from OR to SICU.  6 hour post op labs drawn and sent at 2230 pm, lab then processed the order from this morning which was no longer active.

## 2011-04-07 NOTE — Progress Notes (Signed)
[facility] BRIEF SICU PROGRESS NOTE  1 Day Post-Op  S/P Procedure(s) (LRB): CORONARY ARTERY BYPASS GRAFTING (CABG) (N/A)   Extubated uneventfully IABP out Awake and alert, breathing comfortably on O2 via Datil NSR BP stable CI>3 O2 sats 94% dopamine @ 3 and neo @ 40 UOP marginal now on lasix drip Creatinine up to 2.7  Plan: Continue renal dopamine, lasix drip  Kurt Richard H 04/07/2011 5:42 PM

## 2011-04-07 NOTE — Progress Notes (Signed)
CRITICAL VALUE ALERT  Critical value received:  CKMB 67.6, Trop >25.00  Date of notification:  04/07/11  Time of notification:  1100  Critical value read back:yes  Nurse who received Sharyl Nimrod  MD notified (1st page):  Dr. Elease Hashimoto  Time of first page:  [phone]  MD notified (2nd page):  Time of second page:  Responding MD:  Dr. Elease Hashimoto  Time MD responded:  [phone]

## 2011-04-07 NOTE — Progress Notes (Signed)
1 Day Post-Op Procedure(s) (LRB): CORONARY ARTERY BYPASS GRAFTING (CABG) (N/A) Subjective: Intubated Follows commands Denies pain  Objective: Vital signs in last 24 hours: Temp:  [97.4 F (36.3 C)-101.1 F (38.4 C)] 100.2 F (37.9 C) (03/06 0742) Pulse Rate:  [38-130] 110  (03/06 0742) Cardiac Rhythm:  [-] Sinus tachycardia (03/06 0600) Resp:  [11-23] 18  (03/06 0742) BP: (69-115)/(39-69) 88/42 mmHg (03/06 0742) SpO2:  [95 %-100 %] 99 % (03/06 0742) Arterial Line BP: (81-109)/(32-49) 100/44 mmHg (03/06 0700) FiO2 (%):  [50 %-100 %] 50 % (03/06 0742) Weight:  [160 lb 15 oz (73 kg)-191 lb 2.2 oz (86.7 kg)] 191 lb 2.2 oz (86.7 kg) (03/06 0600)  Hemodynamic parameters for last 24 hours: PAP: (28-42)/(21-29) 42/25 mmHg CO:  [3.9 L/min-4.7 L/min] 4.6 L/min CI:  [2.1 L/min/m2-2.5 L/min/m2] 2.5 L/min/m2  Intake/Output from previous day: 03/05 0701 - 03/06 0700 In: 1610.9 [I.V.:4656.2; Blood:500; NG/GT:120; IV Piggyback:1660] Out: 2775 [Urine:685; Emesis/NG output:330; Blood:1600; Chest Tube:160] Intake/Output this shift:    General appearance: alert and no distress Neurologic: moves all 4 ext. follows commands Heart: regular rate and rhythm Lungs: rhonchi bilaterally Abdomen: normal findings: soft, non-tender and abnormal findings:  hypoactive bowel sounds Extremities: warm faint DP/PT pulses Bilaterally  Lab Results:  Basename 04/07/11 0450 04/06/11 2232 04/06/11 2230  WBC 14.1* -- 17.0*  HGB 10.0* 8.8* --  HCT 27.6* 26.0* --  PLT 191 -- 195   BMET:  Basename 04/07/11 0450 04/06/11 2232 04/06/11 0931  NA 131* 133* --  K 4.5 4.4 --  CL 102 105 --  CO2 17* -- 17*  GLUCOSE 157* 124* --  BUN 28* 25* --  CREATININE 1.59* 1.60* --  CALCIUM 9.4 -- 9.4    PT/INR:  Basename 04/06/11 1747  LABPROT 18.3*  INR 1.49   ABG    Component Value Date/Time   PHART 7.318* 04/07/2011 0455   HCO3 17.1* 04/07/2011 0455   TCO2 18 04/07/2011 0455   ACIDBASEDEF 8.0* 04/07/2011 0455   O2SAT 99.0 04/07/2011 0455   CBG (last 3)   Basename 04/07/11 0749 04/07/11 0337 04/06/11 2339  GLUCAP 156* 150* 130*    Assessment/Plan: S/P Procedure(s) (LRB): CORONARY ARTERY BYPASS GRAFTING (CABG) (N/A) POD #1 emergency CABG CV- s/p MI complicated by CHF, Good cardiac index with low dose milrinone and dopamine and IABP  Decrease IABP to 1:3, then d/c if tolerated  D/C milrinone if index OK off IABP  Check enzymes RESP- intubated, gas exchange OK, but has significant rhonchi, pleural effusions drained in OR  Start albuterol, wean vent after IABP out RENAL- creatinine elevated, likely combination of low flow preop, dye and CPB, continue dopamine,  Start lasix gtt, follow labs and UO  Metabolic acidosis- bicarb ENDO- CBGs OK, lantus, SSI D/C CT later today  Remains ill with multisystem failure, but all in all is doing very well, cautiously optimistic    LOS: 1 day    Tammye Kahler C 04/07/2011

## 2011-04-08 ENCOUNTER — Encounter (HOSPITAL_COMMUNITY): Payer: Self-pay | Admitting: Thoracic Surgery (Cardiothoracic Vascular Surgery)

## 2011-04-08 ENCOUNTER — Inpatient Hospital Stay (HOSPITAL_COMMUNITY): Payer: 59

## 2011-04-08 LAB — POCT I-STAT 3, ART BLOOD GAS (G3+)
Acid-base deficit: 7 mmol/L — ABNORMAL HIGH (ref 0.0–2.0)
Bicarbonate: 17.2 mEq/L — ABNORMAL LOW (ref 20.0–24.0)
O2 Saturation: 88 %
Patient temperature: 97.4
TCO2: 18 mmol/L (ref 0–100)

## 2011-04-08 LAB — CBC
Hemoglobin: 8.7 g/dL — ABNORMAL LOW (ref 13.0–17.0)
MCH: 31.5 pg (ref 26.0–34.0)
MCV: 86.6 fL (ref 78.0–100.0)
Platelets: 182 10*3/uL (ref 150–400)
RBC: 2.76 MIL/uL — ABNORMAL LOW (ref 4.22–5.81)
WBC: 14 10*3/uL — ABNORMAL HIGH (ref 4.0–10.5)

## 2011-04-08 LAB — BASIC METABOLIC PANEL
BUN: 36 mg/dL — ABNORMAL HIGH (ref 6–23)
CO2: 20 mEq/L (ref 19–32)
Chloride: 98 mEq/L (ref 96–112)
Chloride: 97 mEq/L (ref 96–112)
GFR calc Af Amer: 24 mL/min — ABNORMAL LOW (ref >90)
GFR calc non Af Amer: 20 mL/min — ABNORMAL LOW (ref >90)
Glucose, Bld: 111 mg/dL — ABNORMAL HIGH (ref 70–99)
Potassium: 3.9 mEq/L (ref 3.5–5.1)
Potassium: 4.1 mEq/L (ref 3.5–5.1)
Sodium: 131 mEq/L — ABNORMAL LOW (ref 135–145)
Sodium: 129 mEq/L — ABNORMAL LOW (ref 135–145)

## 2011-04-08 LAB — GLUCOSE, CAPILLARY
Glucose-Capillary: 101 mg/dL — ABNORMAL HIGH (ref 70–99)
Glucose-Capillary: 134 mg/dL — ABNORMAL HIGH (ref 70–99)
Glucose-Capillary: 134 mg/dL — ABNORMAL HIGH (ref 70–99)
Glucose-Capillary: 146 mg/dL — ABNORMAL HIGH (ref 70–99)

## 2011-04-08 MED ORDER — INSULIN ASPART 100 UNIT/ML ~~LOC~~ SOLN
0.0000 [IU] | Freq: Three times a day (TID) | SUBCUTANEOUS | Status: DC
  Administered 2011-04-08: 3 [IU] via SUBCUTANEOUS

## 2011-04-08 MED ORDER — INSULIN ASPART 100 UNIT/ML ~~LOC~~ SOLN: 0.0000 [IU] | Freq: Every day | SUBCUTANEOUS | Status: DC

## 2011-04-08 MED ORDER — INSULIN ASPART 100 UNIT/ML ~~LOC~~ SOLN
0.0000 [IU] | SUBCUTANEOUS | Status: AC
  Administered 2011-04-08: 2 [IU] via SUBCUTANEOUS

## 2011-04-08 MED ORDER — INSULIN ASPART 100 UNIT/ML ~~LOC~~ SOLN: 0.0000 [IU] | SUBCUTANEOUS | Status: DC

## 2011-04-08 NOTE — Progress Notes (Signed)
Patient ID: Kurt Richard, male   DOB: 1945-12-19, 66 y.o.   MRN: [id number]  Pt having another "spell". Became mildly agitated, moving around in bed. Speech garbled. Had a similar episode this AM, lasting about 5 minutes, no sequelae after the episode was over.  BP 85/45  Pulse 107  Temp(Src) 98.4 F (36.9 C) (Oral)  Resp 17  Ht 5\' 8"  (1.727 m)  Wt 188 lb 0.8 oz (85.3 kg)  BMI 28.59 kg/m2  SpO2 96%  He has expressive aphasia. Mumbling with very few coherent words. He does respond to commands and moves all 4 ext. He initially didn't follow commands with left hand, but within a few minutes he does. He's currently lethargic but arouses fairly easily and I can't find any focal deficit on exam.  ? If this is TIA, seems to be too brief, but will ask neuro to see. It almost seems seizure- like in onset, resolution, post ictal-like state, but no seizure activity noted.  Stat Head CT Neuro consult

## 2011-04-08 NOTE — Consult Note (Signed)
[facility] STROKE CONSULT NOTE       Chief Complaint: AMS with possible acute stroke  HPI:    Kurt Richard is an 66 y.o. male who presented to the hospital with anterior wall STEMI and underwent a CABG X5 on 04-06-11.  Today patient was noted to have a period of time where he was not responsive then becoming agitated. His BP dropped to 85/45 at that time.  Per nurse, he has had similar episodes in the past (as mentioned by his partner) where he will become unresponsive with garbled speech over the last couple months. Due to recent spell S/P surgery neurology was asked to evaluate.   LSN:  15:01 tPA Given: No: recent CABG    Past Medical History  Diagnosis Date  . Diabetes mellitus   . Hypertension   . CHF (congestive heart failure)   . Hyperlipidemia   . Tobacco abuse     Past Surgical History  Procedure Date  . Coronary artery bypass graft 04/06/2011    Procedure: CORONARY ARTERY BYPASS GRAFTING (CABG);  Surgeon: Loreli Slot, MD;  Location[facility] OR;  Service: Open Heart Surgery;  Laterality: N/A;  Coronary Artery Bypass Graft times four utilizing the left internal mammary artery and the Right and left  greater Saphenous veins Harvested endoscopically.    Family History  Problem Relation Age of Onset  . Heart attack Mother     ?42s  . Heart attack Sister     19s   Social History:  reports that he has been smoking Cigarettes.  He has been smoking about .5 packs per day. He does not have any smokeless tobacco history on file. He reports that he does not drink alcohol or use illicit drugs.  Allergies:  Allergies  Allergen Reactions  . Cardizem Hives  . Nifedipine Swelling    Medications:    Scheduled:   . acetaminophen  1,000 mg Oral Q6H   Or  . acetaminophen (TYLENOL) oral liquid 160 mg/5 mL  975 mg Per Tube Q6H  . albuterol-ipratropium  2 puff Inhalation Q4H  . aspirin  324 mg Oral Pre-Cath  . aspirin EC  325 mg Oral Daily   Or    . aspirin  324 mg Per Tube Daily  . atorvastatin  80 mg Oral q1800  . bisacodyl  10 mg Oral Daily   Or  . bisacodyl  10 mg Rectal Daily  . cefUROXime (ZINACEF)  IV  1.5 g Intravenous Q12H  . docusate sodium  200 mg Oral Daily  . insulin aspart  0-20 Units Subcutaneous TID WC  . insulin aspart  0-5 Units Subcutaneous QHS  . insulin glargine  25 Units Subcutaneous Daily  . metoprolol tartrate  12.5 mg Oral BID   Or  . metoprolol tartrate  12.5 mg Per Tube BID  . pantoprazole  40 mg Oral Q1200  . sodium chloride  3 mL Intravenous Q12H  . sodium chloride  3 mL Intravenous Q12H  . DISCONTD: insulin aspart  0-24 Units Subcutaneous Q4H   Physical Examination: Blood pressure 85/45, pulse 107, temperature 98.4 F (36.9 C), temperature source Oral, resp. rate 17, height 5\' 8"  (1.727 m), weight 85.3 kg (188 lb 0.8 oz), SpO2 96.00%.  Neurologic Examination:   Mental Status: Alert, oriented, thought content appropriate.  Speech fluent with intermittent periods of paraphasic error and slurred speech.  Able to follow 3 step commands without  difficulty. Cranial Nerves: II-Visual field shows a left field cut III/IV/VI-Extraocular movements intact.  Pupils reactive bilaterally. V/VII-Smile symmetric VIII-grossly intact IX/X-normal gag XI-bilateral shoulder shrug XII-midline tongue extension Motor: 4/5 bilaterally with normal tone and bulk. He does show a pronator drift bilateral arms R>L.  Sensory: Pinprick and light touch shows intact sensation bilaterally.  He does show innattentiveness to left leg with DSS (pointing to right leg) but perseverates on stating i am touching his left leg verbally.  Deep Tendon Reflexes: 2+ and symmetric throughout Plantars: Downgoing bilaterally Cerebellar: Normal finger-to-nose,and normal heel-to-shin test.      Lab Results  Component Value Date/Time   CHOL 106 04/07/2011  4:50 AM   Dg Chest Portable 1 View In Am  04/08/2011  *RADIOLOGY REPORT*  Clinical  Data: 66 year old male status post open heart surgery.  PORTABLE CHEST - 1 VIEW  Comparison: 04/07/2011 and earlier.  Findings: Portable AP view at 0545 hours.  The patient is extubated.  Enteric tube has been removed.  Stable right IJ Swan- Ganz catheter.  Stable mediastinal drain and left chest tube. Intra-aortic balloon pump marker no longer identified.  Lower lung volumes.  Stable cardiac size and mediastinal contours.  Increased retrocardiac atelectasis.  Vascular congestion also has increased but there is no overt pulmonary edema.  No pneumothorax.  Possible small effusions.  IMPRESSION: 1.  Extubated and enteric tube removed.  Lower lung volumes and increased atelectasis. 2.  Intra-aortic balloon pump no longer visible.  Mildly increased vascular congestion, no overt edema. 3.  No pneumothorax, probable small effusions.  Original Report Authenticated By: Harley Hallmark, M.D.   Dg Chest Portable 1 View In Am  04/07/2011  *RADIOLOGY REPORT*  Clinical Data: Bypass surgery.  PORTABLE CHEST - 1 VIEW  Comparison: 04/06/2011.  Findings: The support apparatus is stable. The IABP tip is in the aortic arch.  No left-sided pneumothorax.  The lungs demonstrate improved aeration with resolving edema, atelectasis and effusions.  IMPRESSION:  1.  Stable support apparatus. 2.  Improved aeration with resolving edema, atelectasis and effusions.  Original Report Authenticated By: P. Loralie Champagne, M.D.   Dg Chest Portable 1 View  04/06/2011  *RADIOLOGY REPORT*  Clinical Data: Coronary bypass  PORTABLE CHEST - 1 VIEW  Comparison: 04/06/2011  Findings: Endotracheal tube is high at the C7 level 10 cm above the carina.  This can be advanced 5 cm.  Right IJ approach Swan-Ganz catheter is in the central pulmonary outflow tract.  NG tube enters the stomach.  Mediastinal drain and left chest tube noted.  Aortic balloon pump marker is in the proximal descending thoracic aorta. Coronary bypass changes evident.  Heart remains  enlarged with residual asymmetric edema, more pronounced in the right lung. Small right effusion suspected layering posteriorly.  No pneumothorax.  Basilar atelectasis present.  IMPRESSION: High endotracheal tube 10 cm above the carina, could be advanced 5 cm.  Stable CHF pattern and atelectasis  Possible posterior right effusion  No pneumothorax  Original Report Authenticated By: Judie Petit. Ruel Favors, M.D.    Assessment:    66 y.o. male S/P CABG who presented with a spell in which he was AMS and not following commands.  Exam shows slurred speech, bilateral upper extremity drift, right LE drift, field cut on the left, tends to perseverate on "left when asking what side I touched.  Likely a acute infarct unfortunately he is unable to have a MRI of brain.  Patient is schedualed for CT head and will finish rest  of stroke workup.   Stroke Risk Factors - diabetes mellitus, hyperlipidemia and hypertension  Plan: 1. HgbA1c, fasting lipid panel 2. PT consult, OT consult, Speech consult 3. Echocardiogram 4. Carotid dopplers 5. Prophylactic therapy-Antiplatelet med: Aspirin - dose daily 6. Risk factor modification 7. Consider imaging intracranial vessels when renal function improves.     Felicie Morn PA-C [facility] [phone]  04/08/2011, 3:44 PM

## 2011-04-08 NOTE — Progress Notes (Signed)
2 Days Post-Op Procedure(s) (LRB): CORONARY ARTERY BYPASS GRAFTING (CABG) (N/A) Subjective: A little slow to respond, no focal deficits Denies pain, nausea   Objective: Vital signs in last 24 hours: Temp:  [97.9 F (36.6 C)-100 F (37.8 C)] 98.2 F (36.8 C) (03/07 0815) Pulse Rate:  [36-130] 108  (03/07 0800) Cardiac Rhythm:  [-] Sinus tachycardia (03/07 0800) Resp:  [1-24] 14  (03/07 0815) BP: (78-122)/(40-94) 103/57 mmHg (03/07 0800) SpO2:  [88 %-100 %] 92 % (03/07 0815) Arterial Line BP: (74-132)/(40-54) 108/45 mmHg (03/07 0815) FiO2 (%):  [39.7 %-50 %] 40 % (03/06 1045) Weight:  [188 lb 0.8 oz (85.3 kg)] 188 lb 0.8 oz (85.3 kg) (03/07 0600)  Hemodynamic parameters for last 24 hours: PAP: (31-62)/(15-47) 36/18 mmHg CO:  [4.5 L/min-6.1 L/min] 4.5 L/min CI:  [2.4 L/min/m2-3.3 L/min/m2] 2.4 L/min/m2  Intake/Output from previous day: 03/06 0701 - 03/07 0700 In: 2541.1 [I.V.:2241.1; IV Piggyback:300] Out: 648 [Urine:480; Chest Tube:168] Intake/Output this shift: Total I/O In: 71.4 [I.V.:71.4] Out: 90 [Urine:90]  General appearance: alert, no distress and flat affect Neurologic: nonfocal Heart: regular rate and rhythm Lungs: diminished breath sounds bibasilar Abdomen: normal findings: soft, non-tender and abnormal findings:  hypoactive bowel sounds Extremities: warm, good cap refill Wound: dressing clean and dry  Lab Results:  Basename 04/08/11 0500 04/07/11 1702 04/07/11 1700  WBC 14.0* -- 17.2*  HGB 8.7* 8.2* --  HCT 23.9* 24.0* --  PLT 182 -- 185   BMET:  Basename 04/08/11 0500 04/07/11 1702 04/07/11 0450  NA 131* 134* --  K 3.9 4.4 --  CL 98 105 --  CO2 20 -- 17*  GLUCOSE 111* 155* --  BUN 33* 29* --  CREATININE 2.75* 2.70* --  CALCIUM 8.5 -- 9.4    PT/INR:  Basename 04/06/11 1747  LABPROT 18.3*  INR 1.49   ABG    Component Value Date/Time   PHART 7.322* 04/07/2011 1054   HCO3 17.3* 04/07/2011 1054   TCO2 18 04/07/2011 1702   ACIDBASEDEF 8.0*  04/07/2011 1054   O2SAT 92.0 04/07/2011 1054   CBG (last 3)   Basename 04/08/11 0733 04/08/11 0332 04/07/11 2339  GLUCAP 116* 101* 134*    Assessment/Plan: S/P Procedure(s) (LRB): CORONARY ARTERY BYPASS GRAFTING (CABG) (N/A) POD #2 emergency CABG CV- good hemodynamics with IABP out, wean Neo, d/c swan RESP- bilteral atelectasis- IS, cough, deep breathe RENAL- acute renal failure, likely multifactorial, continue dopamine, Lasix gtt, creatinine appears to be plateauing, keep foley D/C CT OOB to chair, ambulate   LOS: 2 days    Ziah Turvey C 04/08/2011

## 2011-04-08 NOTE — Progress Notes (Signed)
Dr Dorris Fetch notified of neuro changes. Patient alert and following commands. Speech garbled. Tongue deviated to the right, no evidence of droop. Oriented only to self. Weaker on left upper and lower. Episode lasted approximately 5 minutes. Symptoms resolved. Dr Dorris Fetch came to assess. No new orders received. Will continue to monitor. JC, RN

## 2011-04-08 NOTE — Progress Notes (Signed)
Pt had great difficulty standing for weight this am, follows all commands, PERRLA, MAE well, oriented to time, place, person, and situtation. Returned to supine position and now on 4L [place], VS as below. Will continue to monitor. L.A. Rn

## 2011-04-08 NOTE — Progress Notes (Signed)
Patient ID: ARNOL MCGIBBON, male   DOB: 30-Dec-1945, 66 y.o.   MRN: [id number] BP 110/60  Pulse 113  Temp(Src) 98.1 F (36.7 C) (Oral)  Resp 17  Ht 5\' 8"  (1.727 m)  Wt 188 lb 0.8 oz (85.3 kg)  BMI 28.59 kg/m2  SpO2 93% Was confused today, now follows commands moves all extremities PAP: (32-62)/(15-47) 45/29 mmHg CO:  [4.5 L/min-5.8 L/min] 4.5 L/min CI:  [2.4 L/min/m2-3.1 L/min/m2] 2.4 L/min/m2  uop past 12hours on lasix drip 1120 ml Lab Results  Component Value Date   WBC 14.0* 04/08/2011   HGB 8.7* 04/08/2011   HCT 23.9* 04/08/2011   PLT 182 04/08/2011   GLUCOSE 120* 04/08/2011   CHOL 106 04/07/2011   TRIG 78 04/07/2011   HDL 27* 04/07/2011   LDLCALC 63 04/07/2011   ALT 11 04/06/2011   AST 40* 04/06/2011   NA 129* 04/08/2011   K 4.1 04/08/2011   CL 97 04/08/2011   CREATININE 3.01* 04/08/2011   BUN 36* 04/08/2011   CO2 21 04/08/2011   INR 1.49 04/06/2011    Acute renal failure, admission cr 1.29 Concern for stroke neuro has seen Keep npo until mental status cleared by neuro    Delight Ovens MD  Beeper [phone] Office [phone] 04/08/2011 7:13 PM

## 2011-04-09 ENCOUNTER — Inpatient Hospital Stay (HOSPITAL_COMMUNITY): Payer: 59

## 2011-04-09 DIAGNOSIS — E785 Hyperlipidemia, unspecified: Secondary | ICD-10-CM

## 2011-04-09 DIAGNOSIS — I219 Acute myocardial infarction, unspecified: Secondary | ICD-10-CM

## 2011-04-09 LAB — BASIC METABOLIC PANEL
BUN: 36 mg/dL — ABNORMAL HIGH (ref 6–23)
CO2: 20 mEq/L (ref 19–32)
CO2: 24 mEq/L (ref 19–32)
Calcium: 8.9 mg/dL (ref 8.4–10.5)
Calcium: 8.5 mg/dL (ref 8.4–10.5)
Chloride: 93 mEq/L — ABNORMAL LOW (ref 96–112)
Glucose, Bld: 100 mg/dL — ABNORMAL HIGH (ref 70–99)
Sodium: 130 mEq/L — ABNORMAL LOW (ref 135–145)
Sodium: 131 mEq/L — ABNORMAL LOW (ref 135–145)

## 2011-04-09 LAB — GLUCOSE, CAPILLARY
Glucose-Capillary: 123 mg/dL — ABNORMAL HIGH (ref 70–99)
Glucose-Capillary: 53 mg/dL — ABNORMAL LOW (ref 70–99)
Glucose-Capillary: 85 mg/dL (ref 70–99)

## 2011-04-09 LAB — LIPID PANEL
Cholesterol: 120 mg/dL (ref 0–200)
HDL: 15 mg/dL — ABNORMAL LOW (ref >39)
LDL Cholesterol: 82 mg/dL (ref 0–99)
Triglycerides: 115 mg/dL (ref <150)

## 2011-04-09 LAB — CBC
HCT: 23.9 % — ABNORMAL LOW (ref 39.0–52.0)
Hemoglobin: 8.8 g/dL — ABNORMAL LOW (ref 13.0–17.0)
MCH: 31.8 pg (ref 26.0–34.0)
MCHC: 36.8 g/dL — ABNORMAL HIGH (ref 30.0–36.0)
MCV: 86.3 fL (ref 78.0–100.0)
RBC: 2.77 MIL/uL — ABNORMAL LOW (ref 4.22–5.81)

## 2011-04-09 LAB — HEMOGLOBIN A1C: Hgb A1c MFr Bld: 8.2 % — ABNORMAL HIGH (ref <5.7)

## 2011-04-09 MED ORDER — POTASSIUM CHLORIDE 10 MEQ/50ML IV SOLN
10.0000 meq | INTRAVENOUS | Status: AC | PRN
  Administered 2011-04-09 – 2011-04-10 (×3): 10 meq via INTRAVENOUS
  Filled 2011-04-09: qty 100
  Filled 2011-04-09 (×2): qty 50
  Filled 2011-04-09: qty 100

## 2011-04-09 MED ORDER — IPRATROPIUM BROMIDE 0.02 % IN SOLN: 0.5000 mg | RESPIRATORY_TRACT | Status: DC | PRN

## 2011-04-09 MED ORDER — POTASSIUM CHLORIDE 10 MEQ/50ML IV SOLN
10.0000 meq | INTRAVENOUS | Status: AC
  Administered 2011-04-09 (×2): 10 meq via INTRAVENOUS

## 2011-04-09 MED ORDER — ALBUTEROL SULFATE (5 MG/ML) 0.5% IN NEBU
2.5000 mg | INHALATION_SOLUTION | RESPIRATORY_TRACT | Status: DC | PRN
  Administered 2011-04-09: 2.5 mg via RESPIRATORY_TRACT
  Filled 2011-04-09: qty 0.5

## 2011-04-09 MED ORDER — ALBUTEROL SULFATE (5 MG/ML) 0.5% IN NEBU
2.5000 mg | INHALATION_SOLUTION | RESPIRATORY_TRACT | Status: DC
  Administered 2011-04-09 (×3): 2.5 mg via RESPIRATORY_TRACT
  Filled 2011-04-09 (×3): qty 0.5

## 2011-04-09 MED ORDER — INSULIN ASPART 100 UNIT/ML ~~LOC~~ SOLN
0.0000 [IU] | Freq: Three times a day (TID) | SUBCUTANEOUS | Status: DC
  Administered 2011-04-09 – 2011-04-11 (×2): 3 [IU] via SUBCUTANEOUS
  Administered 2011-04-12: 4 [IU] via SUBCUTANEOUS
  Administered 2011-04-13: 3 [IU] via SUBCUTANEOUS
  Administered 2011-04-13 – 2011-04-15 (×7): 4 [IU] via SUBCUTANEOUS
  Administered 2011-04-15 – 2011-04-16 (×2): 3 [IU] via SUBCUTANEOUS
  Administered 2011-04-16: 4 [IU] via SUBCUTANEOUS
  Filled 2011-04-09: qty 3

## 2011-04-09 MED ORDER — METOPROLOL TARTRATE 25 MG/10 ML ORAL SUSPENSION
12.5000 mg | Freq: Once | ORAL | Status: AC
  Administered 2011-04-09: 12.5 mg via ORAL
  Filled 2011-04-09: qty 5

## 2011-04-09 MED ORDER — METOPROLOL TARTRATE 25 MG/10 ML ORAL SUSPENSION
25.0000 mg | Freq: Two times a day (BID) | ORAL | Status: DC
  Filled 2011-04-09 (×7): qty 10

## 2011-04-09 MED ORDER — METOPROLOL TARTRATE 12.5 MG HALF TABLET
12.5000 mg | ORAL_TABLET | Freq: Two times a day (BID) | ORAL | Status: DC
  Administered 2011-04-10 – 2011-04-11 (×2): 12.5 mg via ORAL
  Filled 2011-04-09 (×7): qty 1

## 2011-04-09 MED ORDER — INSULIN ASPART 100 UNIT/ML ~~LOC~~ SOLN
0.0000 [IU] | Freq: Every day | SUBCUTANEOUS | Status: DC
  Filled 2011-04-09: qty 3

## 2011-04-09 MED ORDER — POTASSIUM CHLORIDE 10 MEQ/50ML IV SOLN
INTRAVENOUS | Status: AC
  Administered 2011-04-09: 10 meq via INTRAVENOUS
  Filled 2011-04-09: qty 150

## 2011-04-09 MED ORDER — IPRATROPIUM BROMIDE 0.02 % IN SOLN
0.5000 mg | RESPIRATORY_TRACT | Status: DC
  Administered 2011-04-09 (×3): 0.5 mg via RESPIRATORY_TRACT
  Filled 2011-04-09 (×3): qty 2.5

## 2011-04-09 NOTE — Evaluation (Signed)
Occupational Therapy Evaluation Patient Details Name: Kurt Richard MRN: [id number] DOB: Mar 04, 1945 Today's Date: 04/09/2011  Problem List:  Patient Active Problem List  Diagnoses  . Tobacco abuse  . Hyperlipidemia  . Hypertension  . STEMI (ST elevation myocardial infarction)  . Diabetes mellitus    Past Medical History:  Past Medical History  Diagnosis Date  . Diabetes mellitus   . Hypertension   . CHF (congestive heart failure)   . Hyperlipidemia   . Tobacco abuse    Past Surgical History:  Past Surgical History  Procedure Date  . Coronary artery bypass graft 04/06/2011    Procedure: CORONARY ARTERY BYPASS GRAFTING (CABG);  Surgeon: Loreli Slot, MD;  Location: [facility] OR;  Service: Open Heart Surgery;  Laterality: N/A;  Coronary Artery Bypass Graft times four utilizing the left internal mammary artery and the Right and left  greater Saphenous veins Harvested endoscopically.    OT Assessment/Plan/Recommendation OT Assessment Clinical Impression Statement: This 66 y.o. male admitted for CABG, and with questionable new CVA.  CT showed chronic PCA infarct.  Pt. demonstrates impaired cognition, Lt. homonymous hemianopsia; poor awareness of deficits, impaired balance.  Feel pt. would benefit from OT to maximize safety and independence with BADLs to allow pt. to return home with 24 hour supervision or min A.  Pt. would benefit from SLP consult for language and cognitive deficits.  Pt. also would benefit from inpatient rehab.  Pt's friend reports they are in process of arranging 24 hour supervision at discharge.  OT Recommendation/Assessment: Patient will need skilled OT in the acute care venue OT Problem List: Decreased strength;Decreased activity tolerance;Impaired balance (sitting and/or standing);Impaired vision/perception;Decreased coordination;Decreased cognition;Decreased safety awareness;Decreased knowledge of use of DME or AE;Decreased knowledge of precautions;Impaired  sensation;Impaired UE functional use Barriers to Discharge: Decreased caregiver support OT Therapy Diagnosis : Generalized weakness;Cognitive deficits;Disturbance of vision OT Plan OT Frequency: Min 2X/week OT Treatment/Interventions: Self-care/ADL training;DME and/or AE instruction;Therapeutic activities;Cognitive remediation/compensation;Visual/perceptual remediation/compensation;Patient/family education;Balance training OT Recommendation Recommendations for Other Services: Rehab consult; SLP consult for language and cognition Follow Up Recommendations: Inpatient Rehab;Supervision/Assistance - 24 hour Equipment Recommended: Defer to next venue Individuals Consulted Consulted and Agree with Results and Recommendations: Patient OT Goals Acute Rehab OT Goals OT Goal Formulation: With patient Time For Goal Achievement: 2 weeks ADL Goals Pt Will Perform Grooming: Standing at sink;with supervision;with cueing (comment type and amount) (min verbal cuing) ADL Goal: Grooming - Progress: Goal set today Pt Will Perform Upper Body Bathing: with supervision;Sitting, chair;Sitting, edge of bed (min verbal cues) ADL Goal: Upper Body Bathing - Progress: Goal set today Pt Will Perform Lower Body Bathing: with supervision;Sit to stand from chair;Sit to stand from bed (min verbal cues) ADL Goal: Lower Body Bathing - Progress: Goal set today Pt Will Perform Upper Body Dressing: with supervision (min verbal cues) ADL Goal: Upper Body Dressing - Progress: Goal set today Pt Will Perform Lower Body Dressing: with supervision;Sit to stand from chair;Sit to stand from bed (min verbal cues) ADL Goal: Lower Body Dressing - Progress: Goal set today Pt Will Transfer to Toilet: with supervision;Ambulation;Comfort height toilet;3-in-1 ADL Goal: Toilet Transfer - Progress: Goal set today Pt Will Perform Toileting - Clothing Manipulation: with supervision;Standing ADL Goal: Toileting - Clothing Manipulation -  Progress: Goal set today Pt Will Perform Toileting - Hygiene: with supervision;Sitting on 3-in-1 or toilet ADL Goal: Toileting - Hygiene - Progress: Goal set today Additional ADL Goal #1: Pt. will adhere to sternal precautions with min verbal cues during simple ADLs ADL  Goal: Additional Goal #1 - Progress: Goal set today Additional ADL Goal #2: Pt. will locate grooming items in room with min verbal cues ADL Goal: Additional Goal #2 - Progress: Goal set today  OT Evaluation Precautions/Restrictions  Precautions Precautions: Fall;Sternal Prior Functioning Home Living Lives With: Other (Comment) (roommate/partner) Receives Help From: Friend(s) Type of Home: House Home Layout: One level Home Access: Stairs to enter Entrance Stairs-Rails: Right;Left;Can reach both Entrance Stairs-Number of Steps: 3 Bathroom Shower/Tub: Psychologist, counselling;Tub/shower unit (one of each) Bathroom Toilet: Handicapped height Home Adaptive Equipment: None Prior Function Level of Independence: Independent with basic ADLs;Independent with transfers;Independent with homemaking with ambulation;Independent with gait Able to Take Stairs?: Yes Driving: Yes Vocation: Retired Comments: Per family friend, pt's friends are working on establishing a plan for 24 hour assistance/supervision at discharge ADL ADL Eating/Feeding: Simulated;Minimal assistance Where Assessed - Eating/Feeding: Bed level Grooming: Performed;Wash/dry hands;Wash/dry face;Supervision/safety Where Assessed - Grooming: Unsupported;Sitting, bed Upper Body Bathing: Simulated;Minimal assistance (min verbal cues) Where Assessed - Upper Body Bathing: Unsupported;Sitting, bed Lower Body Bathing: Simulated;Moderate assistance Where Assessed - Lower Body Bathing: Sit to stand from bed Upper Body Dressing: Simulated;Moderate assistance (multiple lines) Where Assessed - Upper Body Dressing: Sit to stand from bed Lower Body Dressing:  Simulated;Performed;Moderate assistance (socks) Where Assessed - Lower Body Dressing: Sit to stand from chair Toilet Transfer: Simulated;Moderate assistance Toilet Transfer Details (indicate cue type and reason): Pt. required mod A for transfer to chair due to slow processing, pt. became confused with his lines, and demonstrated difficulty following through with the desired movement Toilet Transfer Method: Stand pivot Toilet Transfer Equipment: Bedside commode Toileting - Clothing Manipulation: Moderate assistance Where Assessed - Toileting Clothing Manipulation: Standing Toileting - Hygiene: Simulated;Moderate assistance Where Assessed - Toileting Hygiene: Sit to stand from 3-in-1 or toilet ADL Comments: Pt. with very slow processing, pt. demonstrated motor planning deficits.  Pt. is very impulsive, with poor awareness of sternal precautions Vision/Perception  Vision - History Baseline Vision: Wears glasses only for reading (Pt. provided contradictory information) Visual History: Corrective eye surgery (Pt. reports bil. cataract removal) Patient Visual Report:  (Pt. unable to provide info) Vision - Assessment Vision Assessment: Vision tested Ocular Range of Motion: Within Functional Limits Tracking/Visual Pursuits: Other (comment) (Pt. loses object on Lt.) Visual Fields: Left homonymous hemianopsia Additional Comments: Upon further questioning, pt. reports he has been seeing lights flickering in his Lt. visual field, and difficulty reading/missing words on Lt. for ?several months Perception Perception: Within Functional Limits Praxis Praxis: Impaired Praxis Impairment Details: Motor planning Cognition Cognition Arousal/Alertness: Awake/alert Overall Cognitive Status: Impaired Attention: Impaired Current Attention Level: Selective (with min-mod verbal cues) Memory: Decreased recall of precautions Memory Deficits: Pt. unable to recall or follow precautions.   Orientation Level:  Oriented to person;Oriented to place;Oriented to situation;Disoriented to time Safety/Judgement: Decreased awareness of safety precautions;Decreased safety judgement for tasks assessed Decreased Safety/Judgement: Decreased awareness of need for assistance;Impulsive Awareness of Errors: Decreased awareness of errors made Decreased Awareness of Errors: Assistance required to correct errors made;Assistance required to identify errors made Awareness of Deficits: Decreased awareness of deficits Problem Solving: Requires assistance for problem solving Cognition - Other Comments: Pt. with significant cognitive impairment.  Pt. very slow to respond, poor safety awareness, and impulsivity noted Sensation/Coordination Sensation Additional Comments: Pt. with impaired grip on Rt. when pulling up socks. Grip strength equal, appeared to be possibly from impaired sensory imput Coordination Gross Motor Movements are Fluid and Coordinated: Yes Fine Motor Movements are Fluid and Coordinated: No Extremity Assessment RUE  Assessment RUE Assessment: Within Functional Limits LUE Assessment LUE Assessment: Within Functional Limits Mobility  Bed Mobility Bed Mobility: Yes Supine to Sit: 4: Min assist (mod verbal cues for sternal precautions) Sitting - Scoot to Edge of Bed: 4: Min assist (mod verbal cues for sternal precautions) Transfers Transfers: Yes Sit to Stand: 4: Min assist;From chair/3-in-1 Sit to Stand Details (indicate cue type and reason): Mod verbal cues for sternal precautions Stand to Sit: 4: Min assist Exercises   End of Session OT - End of Session Activity Tolerance: Patient tolerated treatment well Patient left: in chair;with call bell in reach (sitter present) Nurse Communication: Mobility status for ambulation;Mobility status for transfers General Behavior During Session: Flat affect Cognition: Impaired   Matalyn Nawaz, Ursula Alert M 04/09/2011, 4:31 PM

## 2011-04-09 NOTE — Progress Notes (Signed)
TCTS BRIEF SICU PROGRESS NOTE  3 Days Post-Op  S/P Procedure(s) (LRB): CORONARY ARTERY BYPASS GRAFTING (CABG) (N/A)   Stable day NSR BP stable  Diuresing very well Creatinine stable, K+ 3.7  Plan: Continue current plan  Michaela Shankel H 04/09/2011 6:23 PM

## 2011-04-09 NOTE — Evaluation (Signed)
Physical Therapy Evaluation Patient Details Name: Kurt Richard MRN: [id number] DOB: 1945-03-12 Today's Date: 04/09/2011  Problem List:  Patient Active Problem List  Diagnoses  . Tobacco abuse  . Hyperlipidemia  . Hypertension  . STEMI (ST elevation myocardial infarction)  . Diabetes mellitus    Past Medical History:  Past Medical History  Diagnosis Date  . Diabetes mellitus   . Hypertension   . CHF (congestive heart failure)   . Hyperlipidemia   . Tobacco abuse    Past Surgical History:  Past Surgical History  Procedure Date  . Coronary artery bypass graft 04/06/2011    Procedure: CORONARY ARTERY BYPASS GRAFTING (CABG);  Surgeon: Loreli Slot, MD;  Location: [facility] OR;  Service: Open Heart Surgery;  Laterality: N/A;  Coronary Artery Bypass Graft times four utilizing the left internal mammary artery and the Right and left  greater Saphenous veins Harvested endoscopically.    PT Assessment/Plan/Recommendation PT Assessment Clinical Impression Statement: Pt admitted with STEMI, CABG and speech deficits. Pt with CT negative for acute infarct but chronic R occipital infarct. Pt with difficulty with rapid alternating movements of the eyes and finger to thumb bilaterally able to perform slowly but not rapidly. Pt able to visually track slowly decreased Left peripheral vision. Pt speech slow but fluent on initiation of eval. While walking pt began exhibiting expressive aphasia for a period of grossly 1 min, RN notified and pt returned to bed. Pt speech returned to that of eval initiation. Pt aware of speech deficits. Will follow acutely to maximize mobility, independence and gait before discharge to decrease burden of care.  PT Recommendation/Assessment: Patient will need skilled PT in the acute care venue PT Problem List: Decreased cognition;Decreased knowledge of use of DME;Decreased activity tolerance;Decreased knowledge of precautions Barriers to Discharge: Decreased caregiver  support Barriers to Discharge Comments: Roommate works and no one available 24hrs/day PT Therapy Diagnosis : Altered mental status;Abnormality of gait PT Plan PT Frequency: Min 3X/week PT Treatment/Interventions: Gait training;DME instruction;Stair training;Functional mobility training;Therapeutic activities;Patient/family education PT Recommendation Recommendations for Other Services: OT consult;Speech consult Follow Up Recommendations: Supervision for mobility/OOB;Home health PT if pt able to meet goals. If pt maintains cognitive deficits and unable to achieve goals may need to consider Inpatient Rehab Equipment Recommended: Other (comment) (to be assessed for PT based on progression) PT Goals  Acute Rehab PT Goals PT Goal Formulation: With patient Time For Goal Achievement: 2 weeks Pt will go Supine/Side to Sit: with modified independence PT Goal: Supine/Side to Sit - Progress: Goal set today Pt will go Sit to Supine/Side: with modified independence PT Goal: Sit to Supine/Side - Progress: Goal set today Pt will go Sit to Stand: with modified independence PT Goal: Sit to Stand - Progress: Goal set today Pt will go Stand to Sit: with modified independence PT Goal: Stand to Sit - Progress: Goal set today Pt will Ambulate: >150 feet;with least restrictive assistive device;with supervision PT Goal: Ambulate - Progress: Goal set today Pt will Go Up / Down Stairs: 3-5 stairs;with rail(s);with supervision PT Goal: Up/Down Stairs - Progress: Goal set today Additional Goals Additional Goal #1: Pt will state 3/3 sternal precautions independently PT Goal: Additional Goal #1 - Progress: Goal set today  PT Evaluation Precautions/Restrictions  Precautions Precautions: Fall;Sternal Prior Functioning  Home Living Lives With: Friend(s) Type of Home: House Home Layout: One level Home Access: Stairs to enter Entrance Stairs-Rails: Right;Left;Can reach both Entrance Stairs-Number of Steps:  3 Bathroom Shower/Tub: Fish farm manager Equipment: None  Prior Function Level of Independence: Independent with basic ADLs;Independent with transfers;Independent with homemaking with ambulation;Independent with gait Driving: Yes Vocation: Retired Producer, television/film/video: Awake/alert Overall Cognitive Status: Impaired Memory: Appears impaired Orientation Level: Oriented to person;Oriented to place;Oriented to situation;Disoriented to time Problem Solving: Requires assistance for problem solving Cognition - Other Comments: Pt was given simple money management problem and unable to accurately answer. Pt did respond appropriately for safety in a fire and situation. Immediately after education pt was able to repeat sternal precautions but not with time delay between. Pt slow to respond to questions and commands approximately 50% of the time. Sensation/Coordination Sensation Light Touch: Impaired Detail Light Touch Impaired Details: Impaired RLE;Impaired LLE (pt with neuropathy bilLE, pt inaccurate with leg touched) Extremity Assessment RLE Assessment RLE Assessment: Exceptions to [facility] RLE Strength RLE Overall Strength Comments: 4/5 knee flexion and extension, 3+/5 hip flexion LLE Assessment LLE Assessment: Exceptions to WFL LLE AROM (degrees) LLE Overall AROM Comments: 4/5 knee flexion and extension, 3+/5 hip flexion Mobility (including Balance) Bed Mobility Bed Mobility: Yes Rolling Right: 5: Supervision Rolling Right Details (indicate cue type and reason): cueing to complete Sit to Sidelying Left: 4: Min assist;HOB flat Sit to Sidelying Left Details (indicate cue type and reason): tactile cueing to sequence and min assist to elevate bil LE onto surface Scooting to HOB: 1: +2 Total assist;Patient percentage (comment) (pt =50% with bilLE) Transfers Transfers: Yes Sit to Stand: 4: Min assist;From chair/3-in-1 Sit to Stand Details (indicate cue type and  reason): cueing for hand placement and anterior weight shift Stand to Sit: 4: Min assist Stand to Sit Details: minguard with VC for sequence and precautions Ambulation/Gait Ambulation/Gait: Yes Ambulation/Gait Assistance: 4: Min assist Ambulation/Gait Assistance Details (indicate cue type and reason): minguard with cueing for safety and position in RW Ambulation Distance (Feet): 80 Feet Assistive device: Rolling Haze Gait Pattern: Decreased stride length Stairs: No  Posture/Postural Control Posture/Postural Control: No significant limitations Exercise    End of Session PT - End of Session Equipment Utilized During Treatment: Gait belt Activity Tolerance: Patient tolerated treatment well Patient left: in bed;with call bell in reach;Other (comment) (with sitter) Nurse Communication: Mobility status for transfers;Mobility status for ambulation General Behavior During Session: Flat affect Cognition: Impaired  Delorse Lek 04/09/2011, 9:09 AM  Toney Sang, PT [phone]

## 2011-04-09 NOTE — Progress Notes (Signed)
VASCULAR LAB PRELIMINARY  PRELIMINARY  PRELIMINARY  PRELIMINARY  Carotid duplex  completed.    Preliminary report:  Bilateral:  No evidence of hemodynamically significant internal carotid artery stenosis.   Vertebral artery flow is antegrade.     Terance Hart,  RVT 04/09/2011, 1:05 PM

## 2011-04-09 NOTE — Progress Notes (Signed)
VASCULAR LAB PRELIMINARY  PRELIMINARY  PRELIMINARY  PRELIMINAR TCD completed   Preliminary report:  TCD completed  Aziel Morgan D, RVS 04/09/2011, 12:35 PM

## 2011-04-09 NOTE — Progress Notes (Signed)
Subjective:  Kurt Richard is an 66 y.o. male who presented to the hospital with anterior wall STEMI and underwent a CABG X5 on 04-06-11. Today patient was noted to have a period of time where he was not responsive then becoming agitated. His BP dropped to 85/45 at that time. Per nurse, he has had similar episodes in the past (as mentioned by his partner) where he will become unresponsive with garbled speech over the last couple months. Due to recent spell S/P surgery neurology was asked to evaluate.  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  Objective: Vital signs in last 24 hours: Temp:  [97.3 F (36.3 C)-98.4 F (36.9 C)] 97.5 F (36.4 C) (03/08 0900) Pulse Rate:  [107-124] 115  (03/08 0945) Resp:  [13-26] 20  (03/08 0945) BP: (85-122)/(44-68) 122/63 mmHg (03/08 0945) SpO2:  [90 %-100 %] 93 % (03/08 0945) Arterial Line BP: (91-147)/(40-65) 129/53 mmHg (03/08 0815) Weight:  [82.1 kg (181 lb)] 82.1 kg (181 lb) (03/08 0500) Weight change: -3.2 kg (-7 lb 0.9 oz)    Intake/Output from previous day: 03/07 0701 - 03/08 0700 In: 1624.2 [P.O.:170; I.V.:1254.2; IV Piggyback:200] Out: 3495 [Urine:3495] Intake/Output this shift: Total I/O In: 82.9 [I.V.:82.9] Out: 825 [Urine:825]  Physical exam pleasant elderly caucasian male not in  Distress. CABG scar in midline chest. Rt neck has IJ catheter. Hearing mildly decreased. Cardiac exam tachycardia no murmur. Lungs clear Neurological exam ; Awake  Alert oriented x 2.slow to respond. Diminished attention and recall Dense rt homonymous heminaopsia Normal speech and language.eye movements full without nystagmus. Face symmetric. Tongue midline. Normal strength, tone, reflexes and coordination. Normal sensation. Gait deferred.   Lab Results:  Basename 04/09/11 0430 04/08/11 0500  WBC 12.0* 14.0*  HGB 8.8* 8.7*  HCT 23.9* 23.9*  PLT 202 182   BMET  Basename 04/09/11 0430 04/08/11 1600    NA 130* 129*  K 3.6 4.1  CL 96 97  CO2 20 21  GLUCOSE 100* 120*  BUN 36* 36*  CREATININE 3.05* 3.01*  CALCIUM 8.9 8.6    Studies/Results: Ct Head Wo Contrast  04/08/2011  *RADIOLOGY REPORT*  Clinical Data: Garbled speech.  Confusion  CT HEAD WITHOUT CONTRAST  Technique:  Contiguous axial images were obtained from the base of the skull through the vertex without contrast.  Comparison: None  Findings: There is a focal area of low density within the right occipital lobe image numbers 11 and 12.  This likely represents an area of subacute to chronic infarct.  The remaining portions of the cerebral and cerebellar hemispheres are normal in attenuation and morphology.  The ventricular volumes and sulci appear within normal limits.  There is no evidence for acute brain infarct, hemorrhage or mass.  The paranasal sinuses and the mastoid air cells appear clear.  The skull appears intact.  IMPRESSION:  1.  No acute intracranial abnormalities. 2.  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  Original Report Authenticated By: Rosealee Albee, M.D.   Dg Chest Port 1 View  04/09/2011  *RADIOLOGY REPORT*  Clinical Data: Post CABG, follow-up atelectasis, history CHF, smoking, hypertension, diabetes  PORTABLE CHEST - 1 VIEW  Comparison: Portable exam 0545 hours compared to 04/08/2011  Findings: Interval removal of mediastinal drain, left thoracostomy tube, and Swan-Ganz catheter. Right jugular central venous catheter persist, tip projecting over SVC. Enlargement of cardiac silhouette post CABG. Pulmonary vascular congestion.  Perihilar edema. Persistent atelectasis versus consolidation left lower lobe. No pneumothorax. Tiny left effusion. Overall aeration has improved since previous exam.  IMPRESSION: Improved aeration as above.  Original Report Authenticated By: Lollie Marrow, M.D.   Dg Chest Portable 1 View In Am  04/08/2011  *RADIOLOGY REPORT*  Clinical  Data: 66 year old male status post open heart surgery.  PORTABLE CHEST - 1 VIEW  Comparison: 04/07/2011 and earlier.  Findings: Portable AP view at 0545 hours.  The patient is extubated.  Enteric tube has been removed.  Stable right IJ Swan- Ganz catheter.  Stable mediastinal drain and left chest tube. Intra-aortic balloon pump marker no longer identified.  Lower lung volumes.  Stable cardiac size and mediastinal contours.  Increased retrocardiac atelectasis.  Vascular congestion also has increased but there is no overt pulmonary edema.  No pneumothorax.  Possible small effusions.  IMPRESSION: 1.  Extubated and enteric tube removed.  Lower lung volumes and increased atelectasis. 2.  Intra-aortic balloon pump no longer visible.  Mildly increased vascular congestion, no overt edema. 3.  No pneumothorax, probable small effusions.  Original Report Authenticated By: Harley Hallmark, M.D.     MRI examination of the brain cannot do due to recent CABG  Lipids normal   Medications: I have reviewed the patient's current medications.  Assessment/Plan: 66 year male with recent MI and CABG with altered mental status vision deficits improving suspect new left PCA infarct cardioembolic . Old rt PCA infarct on CT of remote age. Plan ; cannot do MRI due to recent cardiac surgery but suspect stroke is perioperative. Continue aspirin. Check CUS, TCD, HbA1c. Stroke team will follow. PT,OT consults  LOS: 3 days   Perrin Eddleman,PRAMODKUMAR P 04/09/2011, 10:20 AM

## 2011-04-09 NOTE — Progress Notes (Signed)
3 Days Post-Op Procedure(s) (LRB): CORONARY ARTERY BYPASS GRAFTING (CABG) (N/A) Subjective: No Richard/o Denies pain No further 'spells", but is requiring sitter for restlessness  Objective: Vital signs in last 24 hours: Temp:  [97.3 F (36.3 Richard)-98.4 F (36.9 Richard)] 98.4 F (36.9 Richard) (03/08 0743) Pulse Rate:  [105-124] 112  (03/08 0700) Cardiac Rhythm:  [-] Sinus tachycardia (03/07 2000) Resp:  [13-26] 16  (03/08 0700) BP: (85-120)/(44-66) 104/55 mmHg (03/08 0700) SpO2:  [90 %-100 %] 100 % (03/08 0720) Arterial Line BP: (94-147)/(40-65) 127/49 mmHg (03/08 0700) Weight:  [181 lb (82.1 kg)] 181 lb (82.1 kg) (03/08 0500)  Hemodynamic parameters for last 24 hours: PAP: (35-45)/(18-29) 45/29 mmHg  Intake/Output from previous day: 03/07 0701 - 03/08 0700 In: 1624.2 [P.O.:170; I.V.:1254.2; IV Piggyback:200] Out: 3495 [Urine:3495] Intake/Output this shift:    General appearance: alert, distracted and no distress Neurologic: generally weak, strength  relatively symmetric Heart: tachy, regular Lungs: diminished breath sounds bibasilar Abdomen: normal findings: soft, non-tender  Lab Results:  Basename 04/09/11 0430 04/08/11 0500  WBC 12.0* 14.0*  HGB 8.8* 8.7*  HCT 23.9* 23.9*  PLT 202 182   BMET:  Basename 04/09/11 0430 04/08/11 1600  NA 130* 129*  K 3.6 4.1  CL 96 97  CO2 20 21  GLUCOSE 100* 120*  BUN 36* 36*  CREATININE 3.05* 3.01*  CALCIUM 8.9 8.6    PT/INR:  Basename 04/06/11 1747  LABPROT 18.3*  INR 1.49   ABG    Component Value Date/Time   PHART 7.389 04/08/2011 1458   HCO3 17.2* 04/08/2011 1458   TCO2 18 04/08/2011 1458   ACIDBASEDEF 7.0* 04/08/2011 1458   O2SAT 88.0 04/08/2011 1458   CBG (last 3)   Basename 04/09/11 0424 04/08/11 2334 04/08/11 2034  GLUCAP 90 99 124*    Assessment/Plan: S/P Procedure(s) (LRB): CORONARY ARTERY BYPASS GRAFTING (CABG) (N/A) POD # 3 emergency CABG Neuro- possible acute stroke, CT showed an old infarct in the occipital region  which could explain visual field issues. Limited contact and no formal neuro exam preop due to emergent nature of his presentation. PT/OT/Speech  CV- s/p MI with severe LV dysfunction, has been relatively stable from a cardiac perspective preop  RESP- pulmonary toilet  RENAL- creatinine up to 3.05, but has converted to nonoliguric phase, continue dopamine and lasix today. Keep foley in to monitor UO with IV diuretics  DM- CBGs well controlled  Keep in SICU for drips, safety   LOS: 3 days    Kurt Richard 04/09/2011

## 2011-04-09 NOTE — Progress Notes (Signed)
Subjective:   Kurt Richard is a 66 yo who presented with a STEMI and had emergent CABG.  He had a stroke on 3/7.  He is gradually improving.      [name] acetaminophen  1,000 mg Oral Q6H   Or  . acetaminophen (TYLENOL) oral liquid 160 mg/5 mL  975 mg Per Tube Q6H  . aspirin EC  325 mg Oral Daily   Or  . aspirin  324 mg Per Tube Daily  . atorvastatin  80 mg Oral q1800  . bisacodyl  10 mg Oral Daily   Or  . bisacodyl  10 mg Rectal Daily  . docusate sodium  200 mg Oral Daily  . insulin aspart  0-20 Units Subcutaneous TID WC  . insulin aspart  0-24 Units Subcutaneous Q2H  . insulin aspart  0-5 Units Subcutaneous QHS  . insulin glargine  25 Units Subcutaneous Daily  . metoprolol tartrate  12.5 mg Oral BID   Or  . metoprolol tartrate  12.5 mg Per Tube BID  . pantoprazole  40 mg Oral Q1200  . sodium chloride  3 mL Intravenous Q12H  . sodium chloride  3 mL Intravenous Q12H  . DISCONTD: albuterol  2.5 mg Nebulization Q4H  . DISCONTD: albuterol-ipratropium  2 puff Inhalation Q4H  . DISCONTD: insulin aspart  0-20 Units Subcutaneous TID WC  . DISCONTD: insulin aspart  0-24 Units Subcutaneous Q4H  . DISCONTD: insulin aspart  0-5 Units Subcutaneous QHS  . DISCONTD: ipratropium  0.5 mg Nebulization Q4H      . sodium chloride Stopped (04/08/11 1100)  . sodium chloride    . sodium chloride Stopped (04/09/11 0200)  . sodium chloride Stopped (04/08/11 2100)  . dexmedetomidine (PRECEDEX) IV infusion Stopped (04/07/11 1151)  . DOPamine 2.995 mcg/kg/min (04/09/11 0700)  . furosemide (LASIX) infusion 8 mg/hr (04/09/11 0852)  . lactated ringers 20 mL/hr at 04/09/11 0700  . nitroGLYCERIN Stopped (04/06/11 1800)  . phenylephrine (NEO-SYNEPHRINE) Adult infusion 15 mcg/min (04/09/11 0900)  . DISCONTD: milrinone Stopped (04/07/11 1030)    Objective:  Vital Signs in the last 24 hours: Blood pressure 122/63, pulse 115, temperature 97.5 F (36.4 C), temperature source Oral, resp. rate 20, height 5\' 8"   (1.727 m), weight 181 lb (82.1 kg), SpO2 93.00%. Temp:  [97.3 F (36.3 C)-98.4 F (36.9 C)] 97.5 F (36.4 C) (03/08 0900) Pulse Rate:  [107-124] 115  (03/08 0945) Resp:  [13-26] 20  (03/08 0945) BP: (85-122)/(44-68) 122/63 mmHg (03/08 0945) SpO2:  [90 %-100 %] 93 % (03/08 0945) Arterial Line BP: (91-147)/(40-65) 129/53 mmHg (03/08 0815) Weight:  [181 lb (82.1 kg)] 181 lb (82.1 kg) (03/08 0500)  Intake/Output from previous day: 03/07 0701 - 03/08 0700 In: 1624.2 [P.O.:170; I.V.:1254.2; IV Piggyback:200] Out: 3495 [Urine:3495] Intake/Output from this shift: Total I/O In: 82.9 [I.V.:82.9] Out: 825 [Urine:825]  Physical Exam:  Physical Exam: Blood pressure 122/63, pulse 115, temperature 97.5 F (36.4 C), temperature source Oral, resp. rate 20, height 5\' 8"  (1.727 m), weight 181 lb (82.1 kg), SpO2 93.00%. General: Well developed, well nourished, in no acute distress. Head: Normocephalic, atraumatic, sclera non-icteric, mucus membranes are moist,  Neck: Supple. Right IJ line in place Lungs: bilateral rales , +/- pleural rub anteriorly Heart: Regular rate,  With normal  S1 S2. No murmurs, rubs, or gallops  Abdomen: Soft, non-tender, non-distended with normoactive bowel sounds. No hepatomegaly. No rebound/guarding. No abdominal masses. Msk:  Strength and tone appear normal for age. Extremities: No clubbing or cyanosis. No edema.  Distal pedal pulses are 2+ and equal bilaterally. Neuro: Alert and oriented X 3. Speech is a bit slower.   Psych:  Responds to questions appropriately with a normal affect.    Lab Results:   Basename 04/09/11 0430 04/08/11 1600 04/07/11 1700 04/07/11 0450  NA 130* 129* -- --  K 3.6 4.1 -- --  CL 96 97 -- --  CO2 20 21 -- --  GLUCOSE 100* 120* -- --  BUN 36* 36* -- --  CREATININE 3.05* 3.01* -- --  CALCIUM 8.9 8.6 -- --  MG -- -- 2.7* 2.9*  PHOS -- -- -- --   No results found for this basename: AST:2,ALT:2,ALKPHOS:2,BILITOT:2,PROT:2,ALBUMIN:2 in  the last 72 hours No results found for this basename: LIPASE:2,AMYLASE:2 in the last 72 hours  Basename 04/09/11 0430 04/08/11 0500  WBC 12.0* 14.0*  NEUTROABS -- --  HGB 8.8* 8.7*  HCT 23.9* 23.9*  MCV 86.3 86.6  PLT 202 182     Basename 04/08/11 1600  HGBA1C 8.2*    Basename 04/09/11 0430  CHOL 120  HDL 15*  LDLCALC 82  TRIG 562  CHOLHDL 8.0   No results found for this basename: TSH,T4TOTAL,FREET3,T3FREE,THYROIDAB in the last 72 hours No results found for this basename: VITAMINB12,FOLATE,FERRITIN,TIBC,IRON,RETICCTPCT in the last 72 hours   Tele: Sinus tachycardia  Assessment/Plan:   1. CAD : s/p emergent CABG.  Unfortunately he had a stroke post operatively.  Renal function still has not recovered.    HR is a bit fast.  BP is OK.  Will increase metoprolol to 25 BID. No new cardiac recs.   [name], [name]., MD, [facility] 04/09/2011, 10:39 AM LOS: Day 3

## 2011-04-09 NOTE — Progress Notes (Signed)
Speech Language/Pathology  Orders received for swallow evaluation. Patient has passed the RN stroke swallow screen and a clear liquid diet has been initiated. Patient in room eating am meal at this time and neither he, his partner, or CNA notes any difficulty with swallowing. SLP did observe patient with a few sips of thin liquids and no overt s/s of aspiration were noted. Deferring formal swallowing evaluation per protocol (passed screen) however please re-consult if difficulty arises. Thank you.  Ferdinand Lango MA, CCC-SLP [phone]

## 2011-04-10 ENCOUNTER — Inpatient Hospital Stay (HOSPITAL_COMMUNITY): Payer: 59

## 2011-04-10 DIAGNOSIS — F17200 Nicotine dependence, unspecified, uncomplicated: Secondary | ICD-10-CM

## 2011-04-10 LAB — TYPE AND SCREEN: Unit division: 0

## 2011-04-10 LAB — CBC
HCT: 26.2 % — ABNORMAL LOW (ref 39.0–52.0)
MCV: 85.6 fL (ref 78.0–100.0)
Platelets: 279 10*3/uL (ref 150–400)
RBC: 3.06 MIL/uL — ABNORMAL LOW (ref 4.22–5.81)
WBC: 11.5 10*3/uL — ABNORMAL HIGH (ref 4.0–10.5)

## 2011-04-10 LAB — GLUCOSE, CAPILLARY
Glucose-Capillary: 82 mg/dL (ref 70–99)
Glucose-Capillary: 83 mg/dL (ref 70–99)
Glucose-Capillary: 95 mg/dL (ref 70–99)
Glucose-Capillary: 103 mg/dL — ABNORMAL HIGH (ref 70–99)

## 2011-04-10 LAB — BASIC METABOLIC PANEL
CO2: 24 mEq/L (ref 19–32)
Chloride: 92 mEq/L — ABNORMAL LOW (ref 96–112)
Creatinine, Ser: 1.84 mg/dL — ABNORMAL HIGH (ref 0.50–1.35)
Potassium: 3.5 mEq/L (ref 3.5–5.1)

## 2011-04-10 MED ORDER — POTASSIUM CHLORIDE 10 MEQ/50ML IV SOLN
10.0000 meq | INTRAVENOUS | Status: DC | PRN
  Administered 2011-04-10 (×2): 10 meq via INTRAVENOUS
  Filled 2011-04-10: qty 100
  Filled 2011-04-10: qty 50

## 2011-04-10 MED ORDER — POTASSIUM CHLORIDE 10 MEQ/50ML IV SOLN
10.0000 meq | INTRAVENOUS | Status: AC
  Administered 2011-04-10 (×2): 10 meq via INTRAVENOUS

## 2011-04-10 NOTE — Progress Notes (Signed)
   CARDIOTHORACIC SURGERY PROGRESS NOTE   R4 Days Post-Op Procedure(s) (LRB): CORONARY ARTERY BYPASS GRAFTING (CABG) (N/A)  Subjective: Feels okay.  No complaints  Objective: Vital signs: BP Readings from Last 1 Encounters:  04/10/11 113/59   Pulse Readings from Last 1 Encounters:  04/10/11 109   Resp Readings from Last 1 Encounters:  04/10/11 19   Temp Readings from Last 1 Encounters:  04/10/11 98.3 F (36.8 C) Oral    Hemodynamics:    Physical Exam:  Rhythm:   Sinus tach  Breath sounds: clear  Heart sounds:  RRR  Incisions:  Clean and dry  Abdomen:  Soft, non tender  Extremities:  Warm, well perfused   Intake/Output from previous day: 03/08 0701 - 03/09 0700 In: 3178.3 [P.O.:1680; I.V.:1198.3; IV Piggyback:300] Out: 6485 [Urine:6485] Intake/Output this shift: Total I/O In: 616.8 [P.O.:450; I.V.:116.8; IV Piggyback:50] Out: 525 [Urine:525]  Lab Results:  [facility] 04/10/11 0416 04/09/11 0430  WBC 11.5* 12.0*  HGB 9.4* 8.8*  HCT 26.2* 23.9*  PLT 279 202   BMET:  Basename 04/10/11 0416 04/09/11 1715  NA 129* 131*  K 3.5 3.7  CL 92* 93*  CO2 24 24  GLUCOSE 166* 56*  BUN 36* 38*  CREATININE 1.84* 2.98*  CALCIUM 8.5 8.5    CBG (last 3)   Basename 04/10/11 0714 04/10/11 0339 04/10/11 0014  GLUCAP 83 82 78   ABG    Component Value Date/Time   PHART 7.389 04/08/2011 1458   HCO3 17.2* 04/08/2011 1458   TCO2 18 04/08/2011 1458   ACIDBASEDEF 7.0* 04/08/2011 1458   O2SAT 88.0 04/08/2011 1458   CXR: Mild bibasilar atelectasis +/- consolidation  Assessment/Plan: S/P Procedure(s) (LRB): CORONARY ARTERY BYPASS GRAFTING (CABG) (N/A)  Stable POD4 Acute renal failure resolving, diuresing extremely well - possibly too fast   Wean neo then dopamine off  D/C lasix drip  Mobilize  Kurt Richard 04/10/2011 10:02 AM

## 2011-04-10 NOTE — Progress Notes (Signed)
Patient ID: Kurt Richard, male   DOB: 10-31-1945, 66 y.o.   MRN: [id number] Stroke Team Progress Note  HISTORY Kurt Richard is an 66 y.o. male who presented to the hospital with anterior wall STEMI and underwent a CABG X5 on 04-06-11. On 3/8 patient was noted to have a period of time where he was not responsive then became agitated. His BP dropped to 85/45 at that time. Per nurse, he has had similar episodes in the past (as mentioned by his partner) where he will become unresponsive with garbled speech over the last couple months. Due to recent spell S/P surgery neurology was asked to evaluate.  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  SUBJECTIVE Patient is setting in his chair with aide eating breakfast. Reports he is much better today.  OBJECTIVE Most recent Vital Signs: Temp: 98.3 F (36.8 C) (03/09 0716) Temp src: Oral (03/09 0716) BP: 113/59 mmHg (03/09 0930) Pulse Rate: 109  (03/09 0930) Respiratory Rate: 19 O2 Saturation: 98%  CBG (last 3)  Basename 04/10/11 0714 04/10/11 0339 04/10/11 0014  GLUCAP 83 82 78   Intake/Output from previous day: 03/08 0701 - 03/09 0700 In: 3178.3 [P.O.:1680; I.V.:1198.3; IV Piggyback:300] Out: 6485 [Urine:6485]  IV Fluid Intake:     . sodium chloride Stopped (04/08/11 1100)  . sodium chloride    . sodium chloride Stopped (04/09/11 0200)  . sodium chloride Stopped (04/08/11 2100)  . DOPamine 2.995 mcg/kg/min (04/09/11 0700)  . lactated ringers 20 mL/hr at 04/09/11 0700  . phenylephrine (NEO-SYNEPHRINE) Adult infusion 35 mcg/min (04/10/11 0320)  . DISCONTD: dexmedetomidine (PRECEDEX) IV infusion Stopped (04/07/11 1151)  . DISCONTD: furosemide (LASIX) infusion 8 mg/hr (04/10/11 0236)  . DISCONTD: nitroGLYCERIN Stopped (04/06/11 1800)   Medications    . acetaminophen  1,000 mg Oral Q6H   Or  . acetaminophen (TYLENOL) oral liquid 160 mg/5 mL  975 mg Per Tube Q6H  . aspirin EC  325  mg Oral Daily   Or  . aspirin  324 mg Per Tube Daily  . atorvastatin  80 mg Oral q1800  . bisacodyl  10 mg Oral Daily   Or  . bisacodyl  10 mg Rectal Daily  . docusate sodium  200 mg Oral Daily  . insulin aspart  0-20 Units Subcutaneous TID WC  . insulin aspart  0-5 Units Subcutaneous QHS  . metoprolol tartrate  12.5 mg Oral Once  . metoprolol tartrate  25 mg Per Tube BID   Or  . metoprolol tartrate  12.5 mg Oral BID  . pantoprazole  40 mg Oral Q1200  . potassium chloride  10 mEq Intravenous Q1 Hr x 3  . potassium chloride  10 mEq Intravenous Q1 Hr x 2  . sodium chloride  3 mL Intravenous Q12H  . sodium chloride  3 mL Intravenous Q12H  . DISCONTD: insulin glargine  25 Units Subcutaneous Daily  . DISCONTD: metoprolol tartrate  12.5 mg Per Tube BID  . DISCONTD: metoprolol tartrate  12.5 mg Oral BID  PRN sodium chloride, albuterol, ipratropium, metoprolol, morphine, ondansetron (ZOFRAN) IV, oxyCODONE, potassium chloride, potassium chloride, sodium chloride, sodium chloride  Diet:  Clear Liquid  Activity:  Bathroom privileges; Up with assistance DVT Prophylaxis:  SCDs  Studies: CBC    Component Value Date/Time   WBC 11.5* 04/10/2011 0416   RBC 3.06* 04/10/2011 0416   HGB 9.4* 04/10/2011 0416   HCT 26.2* 04/10/2011 0416   PLT 279  04/10/2011 0416   MCV 85.6 04/10/2011 0416   MCH 30.7 04/10/2011 0416   MCHC 35.9 04/10/2011 0416   RDW 13.9 04/10/2011 0416   LYMPHSABS 1.2 04/06/2011 0931   MONOABS 1.4* 04/06/2011 0931   EOSABS 0.0 04/06/2011 0931   BASOSABS 0.0 04/06/2011 0931   CMP    Component Value Date/Time   NA 129* 04/10/2011 0416   K 3.5 04/10/2011 0416   CL 92* 04/10/2011 0416   CO2 24 04/10/2011 0416   GLUCOSE 166* 04/10/2011 0416   BUN 36* 04/10/2011 0416   CREATININE 1.84* 04/10/2011 0416   CALCIUM 8.5 04/10/2011 0416   PROT 7.3 04/06/2011 0931   ALBUMIN 3.3* 04/06/2011 0931   AST 40* 04/06/2011 0931   ALT 11 04/06/2011 0931   ALKPHOS 84 04/06/2011 0931   BILITOT 1.2 04/06/2011 0931   GFRNONAA 37*  04/10/2011 0416   GFRAA 43* 04/10/2011 0416   COAGS Lab Results  Component Value Date   INR 1.49 04/06/2011   Lipid Panel    Component Value Date/Time   CHOL 120 04/09/2011 0430   TRIG 115 04/09/2011 0430   HDL 15* 04/09/2011 0430   CHOLHDL 8.0 04/09/2011 0430   VLDL 23 04/09/2011 0430   LDLCALC 82 04/09/2011 0430   HgbA1C  Lab Results  Component Value Date   HGBA1C 8.2* 04/08/2011   Urine Drug Screen  No results found for this basename: labopia, cocainscrnur, labbenz, amphetmu, thcu, labbarb    Alcohol Level No results found for this basename: eth     Results for orders placed during the hospital encounter of 04/06/11 (from the past 24 hour(s))  GLUCOSE, CAPILLARY     Status: Abnormal   Collection Time   04/09/11 11:47 AM      Component Value Range   Glucose-Capillary 123 (*) 70 - 99 (mg/dL)   Comment 1 Documented in Chart     Comment 2 Notify RN    BASIC METABOLIC PANEL     Status: Abnormal   Collection Time   04/09/11  5:15 PM      Component Value Range   Sodium 131 (*) 135 - 145 (mEq/L)   Potassium 3.7  3.5 - 5.1 (mEq/L)   Chloride 93 (*) 96 - 112 (mEq/L)   CO2 24  19 - 32 (mEq/L)   Glucose, Bld 56 (*) 70 - 99 (mg/dL)   BUN 38 (*) 6 - 23 (mg/dL)   Creatinine, Ser 1.61 (*) 0.50 - 1.35 (mg/dL)   Calcium 8.5  8.4 - 09.6 (mg/dL)   GFR calc non Af Amer 21 (*) >90 (mL/min)   GFR calc Af Amer 24 (*) >90 (mL/min)  GLUCOSE, CAPILLARY     Status: Abnormal   Collection Time   04/09/11  5:48 PM      Component Value Range   Glucose-Capillary 53 (*) 70 - 99 (mg/dL)   Comment 1 Documented in Chart     Comment 2 Notify RN    GLUCOSE, CAPILLARY     Status: Normal   Collection Time   04/09/11  6:18 PM      Component Value Range   Glucose-Capillary 85  70 - 99 (mg/dL)   Comment 1 Documented in Chart     Comment 2 Notify RN    GLUCOSE, CAPILLARY     Status: Normal   Collection Time   04/09/11  9:54 PM      Component Value Range   Glucose-Capillary 77  70 - 99 (mg/dL)   Comment  1 Documented  in Chart     Comment 2 Notify RN    GLUCOSE, CAPILLARY     Status: Abnormal   Collection Time   04/09/11 11:31 PM      Component Value Range   Glucose-Capillary 64 (*) 70 - 99 (mg/dL)  GLUCOSE, CAPILLARY     Status: Normal   Collection Time   04/10/11 12:14 AM      Component Value Range   Glucose-Capillary 78  70 - 99 (mg/dL)  GLUCOSE, CAPILLARY     Status: Normal   Collection Time   04/10/11  3:39 AM      Component Value Range   Glucose-Capillary 82  70 - 99 (mg/dL)  CBC     Status: Abnormal   Collection Time   04/10/11  4:16 AM      Component Value Range   WBC 11.5 (*) 4.0 - 10.5 (K/uL)   RBC 3.06 (*) 4.22 - 5.81 (MIL/uL)   Hemoglobin 9.4 (*) 13.0 - 17.0 (g/dL)   HCT 08.6 (*) 57.8 - 52.0 (%)   MCV 85.6  78.0 - 100.0 (fL)   MCH 30.7  26.0 - 34.0 (pg)   MCHC 35.9  30.0 - 36.0 (g/dL)   RDW 46.9  62.9 - 52.8 (%)   Platelets 279  150 - 400 (K/uL)  BASIC METABOLIC PANEL     Status: Abnormal   Collection Time   04/10/11  4:16 AM      Component Value Range   Sodium 129 (*) 135 - 145 (mEq/L)   Potassium 3.5  3.5 - 5.1 (mEq/L)   Chloride 92 (*) 96 - 112 (mEq/L)   CO2 24  19 - 32 (mEq/L)   Glucose, Bld 166 (*) 70 - 99 (mg/dL)   BUN 36 (*) 6 - 23 (mg/dL)   Creatinine, Ser 4.13 (*) 0.50 - 1.35 (mg/dL)   Calcium 8.5  8.4 - 24.4 (mg/dL)   GFR calc non Af Amer 37 (*) >90 (mL/min)   GFR calc Af Amer 43 (*) >90 (mL/min)  GLUCOSE, CAPILLARY     Status: Normal   Collection Time   04/10/11  7:14 AM      Component Value Range   Glucose-Capillary 83  70 - 99 (mg/dL)   Comment 1 Documented in Chart     Comment 2 Notify RN      Ct Head Wo Contrast  04/08/2011  *RADIOLOGY REPORT*  Clinical Data: Garbled speech.  Confusion  CT HEAD WITHOUT CONTRAST  Technique:  Contiguous axial images were obtained from the base of the skull through the vertex without contrast.  Comparison: None  Findings: There is a focal area of low density within the right occipital lobe image numbers 11 and 12.  This likely  represents an area of subacute to chronic infarct.  The remaining portions of the cerebral and cerebellar hemispheres are normal in attenuation and morphology.  The ventricular volumes and sulci appear within normal limits.  There is no evidence for acute brain infarct, hemorrhage or mass.  The paranasal sinuses and the mastoid air cells appear clear.  The skull appears intact.  IMPRESSION:  1.  No acute intracranial abnormalities. 2.  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  Original Report Authenticated By: Rosealee Albee, M.D.   Dg Chest Port 1 View  04/10/2011  *RADIOLOGY REPORT*  Clinical Data: Status post CABG.  PORTABLE CHEST - 1 VIEW  Comparison: 04/09/2011  Findings:  Right jugular sheath remains.  Residual mild edema is stable.  Stable left lower lobe atelectasis.  Slight increase in right lower lung atelectasis.  No pneumothorax or significant pleural effusions.  Stable cardiomegaly.  IMPRESSION: Increase in right lower lung atelectasis.  No pneumothorax identified.  Original Report Authenticated By: Reola Calkins, M.D.   Dg Chest Port 1 View  04/09/2011  *RADIOLOGY REPORT*  Clinical Data: Post CABG, follow-up atelectasis, history CHF, smoking, hypertension, diabetes  PORTABLE CHEST - 1 VIEW  Comparison: Portable exam 0545 hours compared to 04/08/2011  Findings: Interval removal of mediastinal drain, left thoracostomy tube, and Swan-Ganz catheter. Right jugular central venous catheter persist, tip projecting over SVC. Enlargement of cardiac silhouette post CABG. Pulmonary vascular congestion. Perihilar edema. Persistent atelectasis versus consolidation left lower lobe. No pneumothorax. Tiny left effusion. Overall aeration has improved since previous exam.  IMPRESSION: Improved aeration as above.  Original Report Authenticated By: Lollie Marrow, M.D.   Physical Exam   GENERAL:   Well nourished, well hydrated, no acute distress.    CARDIOVASCULAR:   Regular rate and rhythm, no thrills or palpable murmurs, S1, S2, no murmur, no rubs or gallops.      Carotid arteries: No carotid bruits.   MENTAL STATUS EXAM:    Orientation:  Alert and oriented to person, place and time.      Language:  Speech is clear; rare word-finding difficulty.   CRANIAL NERVES:     CN 2 (Optic):  Dense left homonymous hemianopsia.     CN 3,4,6 (EOM):  Pupils equal and reactive to light and near full eye movement without nystagmus.      CN 5 (Trigeminal):  Facial sensation is normal, no weakness of masticatory muscles.      CN 7 (Facial):  No facial weakness or asymmetry.      CN 8 (Auditory):  Auditory acuity grossly normal.      CN 9,10 (Glossophar):  The uvula is midline, the palate elevates symmetrically.      CN 11 (spinal access):  Normal sternocleidomastoid and trapezius strength.      CN 12 (Hypoglossal):  The tongue is midline. No atrophy or fasciculations.   MOTOR:    Full and symmetric strength throughout.  REFLEXES:     Triceps:                 (R): 2+  (L): 2+      Biceps:                  (R): 2+  (L): 2+      Brachioradialis:     (R): 2+  (L): 2+      Patellar:                 (R): 1+  (L): 1+      Achilles:                 (R): 1+  (L): 1+      Babinski:    (R): absent  (L): absent   COORDINATION:     Intact finger-to-nose.  SENSATION:     Intact to light touch.   GAIT:     Deferred.  ASSESSMENT Kurt Richard is a 66 y.o. 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 Preliminary TCD and CUS studies are unremarkable but final reports are pending. Patient cannot get an MRI due to recent CABG. His renal function is  improving but still not safe for a contrasted CT.  Hospital day # 4  TREATMENT/PLAN Continue aspirin 325 mg orally every day for secondary stroke prevention.  Continue PT/OT/ST as tolerated. Will follow up final TCD and carotid results.  Kipp Laurence,  MD [facility] Pager: [phone] 04/10/2011 10:29 AM

## 2011-04-10 NOTE — Progress Notes (Signed)
TCTS BRIEF SICU PROGRESS NOTE  4 Days Post-Op  S/P Procedure(s) (LRB): CORONARY ARTERY BYPASS GRAFTING (CABG) (N/A)   Stable day Tolerating wean of Neo drip UOP still excellent  Plan: Continue current plan  Tramar Brueckner H 04/10/2011 6:08 PM

## 2011-04-11 LAB — BASIC METABOLIC PANEL
Calcium: 8.2 mg/dL — ABNORMAL LOW (ref 8.4–10.5)
Creatinine, Ser: 2.06 mg/dL — ABNORMAL HIGH (ref 0.50–1.35)
GFR calc non Af Amer: 32 mL/min — ABNORMAL LOW (ref >90)
Sodium: 130 mEq/L — ABNORMAL LOW (ref 135–145)

## 2011-04-11 LAB — CBC
MCH: 31 pg (ref 26.0–34.0)
MCHC: 35.7 g/dL (ref 30.0–36.0)
MCV: 86.9 fL (ref 78.0–100.0)
Platelets: 289 10*3/uL (ref 150–400)
RDW: 14.1 % (ref 11.5–15.5)

## 2011-04-11 LAB — GLUCOSE, CAPILLARY: Glucose-Capillary: 79 mg/dL (ref 70–99)

## 2011-04-11 MED ORDER — ENOXAPARIN SODIUM 30 MG/0.3ML ~~LOC~~ SOLN
30.0000 mg | SUBCUTANEOUS | Status: DC
  Administered 2011-04-11 – 2011-04-15 (×5): 30 mg via SUBCUTANEOUS
  Filled 2011-04-11 (×6): qty 0.3

## 2011-04-11 MED ORDER — DOPAMINE-DEXTROSE 3.2-5 MG/ML-% IV SOLN
3.0000 ug/kg/min | INTRAVENOUS | Status: DC
  Administered 2011-04-11: 3 ug/kg/min via INTRAVENOUS
  Filled 2011-04-11: qty 250

## 2011-04-11 NOTE — Progress Notes (Signed)
   CARDIOTHORACIC SURGERY PROGRESS NOTE   R5 Days Post-Op Procedure(s) (LRB): CORONARY ARTERY BYPASS GRAFTING (CABG) (N/A)  Subjective: Feels okay. No specific complaints.  Objective: Vital signs: BP Readings from Last 1 Encounters:  04/11/11 108/56   Pulse Readings from Last 1 Encounters:  04/11/11 98   Resp Readings from Last 1 Encounters:  04/11/11 15   Temp Readings from Last 1 Encounters:  04/11/11 98.1 F (36.7 C) Oral    Hemodynamics:    Physical Exam:  Rhythm:   sinus  Breath sounds: clear  Heart sounds:  RRR  Incisions:  Clean and dry  Abdomen:  soft  Extremities:  Warm, well perfused   Intake/Output from previous day: 03/09 0701 - 03/10 0700 In: 1440.8 [P.O.:450; I.V.:840.8; IV Piggyback:150] Out: 3895 [Urine:3895] Intake/Output this shift: Total I/O In: 365.5 [P.O.:300; I.V.:65.5] Out: 115 [Urine:115]  Lab Results:  Basename 04/11/11 0408 04/10/11 0416  WBC 9.0 11.5*  HGB 9.5* 9.4*  HCT 26.6* 26.2*  PLT 289 279   BMET:  Basename 04/11/11 0408 04/10/11 0416  NA 130* 129*  K 4.0 3.5  CL 93* 92*  CO2 26 24  GLUCOSE 95 166*  BUN 40* 36*  CREATININE 2.06* 1.84*  CALCIUM 8.2* 8.5    CBG (last 3)   Basename 04/11/11 0712 04/10/11 2144 04/10/11 1621  GLUCAP 79 103* 95   ABG    Component Value Date/Time   PHART 7.389 04/08/2011 1458   HCO3 17.2* 04/08/2011 1458   TCO2 18 04/08/2011 1458   ACIDBASEDEF 7.0* 04/08/2011 1458   O2SAT 88.0 04/08/2011 1458   CXR: n/a  Assessment/Plan: S/P Procedure(s) (LRB): CORONARY ARTERY BYPASS GRAFTING (CABG) (N/A)  Stable POD5 Acute renal failure resolving although creatinine increased slightly overnight BP marginal with dopamine decreased Clinically with minimal volume overload on physical exam   Will continue dopamine at 3 mcg/kg/min  Check ECHO in am to f/u LV function  Mobilize  Kurt Richard H 04/11/2011 12:04 PM

## 2011-04-11 NOTE — Evaluation (Signed)
Speech Language Pathology Evaluation Patient Details Name: Kurt Richard MRN: [id number] DOB: 01-12-1946 Today's Date: 04/11/2011  Problem List:  Patient Active Problem List  Diagnoses  . Tobacco abuse  . Hyperlipidemia  . Hypertension  . STEMI (ST elevation myocardial infarction)  . Diabetes mellitus   Past Medical History:  Past Medical History  Diagnosis Date  . Diabetes mellitus   . Hypertension   . CHF (congestive heart failure)   . Hyperlipidemia   . Tobacco abuse    Past Surgical History:  Past Surgical History  Procedure Date  . Coronary artery bypass graft 04/06/2011    Procedure: CORONARY ARTERY BYPASS GRAFTING (CABG);  Surgeon: Loreli Slot, MD;  Location: [facility] OR;  Service: Open Heart Surgery;  Laterality: N/A;  Coronary Artery Bypass Graft times four utilizing the left internal mammary artery and the Right and left  greater Saphenous veins Harvested endoscopically.    SLP Assessment/Plan/Recommendation Assessment: 66 y/o male who presented to [facility] with anterior wall STEMI and underwent a CABG x5 on 04-06-11. During current hospital course patient exhibited stroke like symptoms. CT revealed chronic right PCA infarct and new left PCA infarct.  Patient referred for Cognitive-Linguistic evaluation due to nursing reports of intermittent slurred speech and noted decreased safety awareness and processing skills. No prior reports of cognitive deficits.  Clinical Impression Statement: Moderate cognitive deficits in following areas: auditory comprehension at paragraph level 1/6 questions correct,  sustained  and selective attention with inability to count forward to 40 by 3's with no awareness of errors , verbally sequencing 3 - 4 part ADL , abstract reasoning, functional and complex problem solving with decreased emergent and anticipatorty awareness . Secondary behaviors observed ; impulsity, confabulation, and delayed responses.   SLP Recommendation/Assessment: Patient will  need skilled Speech Lanaguage Pathology Services in the acute care venue to address identified deficits.  Recommend CIR consult.  Recommend 24 hour supervision at next level of care secondary to cognitive deficits.  Problem List: Auditory comprehension;Executive Functioning;Attention;Problem Solving;Reasoning Therapy Diagnosis: Cognitive Impairments Plan Speech Therapy Frequency: min 2x/week Duration: 2 weeks Treatment/Interventions: SLP instruction and feedback;Compensatory strategies;Patient/family education;Functional tasks Potential to Achieve Goals: Good SLP Recommendations Recommendations for Other Services: Rehab consult Follow up Recommendations: Inpatient Rehab Equipment Recommended: Defer to next venue Individuals Consulted Consulted and Agree with Results and Recommendations: Patient  SLP Goals  SLP Goals Potential to Achieve Goals: Good SLP Goal #1: Recall main ideal and 3 details from narrative after 5 minute delay with minimal assist. SLP Goal #2: Verbally sequence 3 to 4 part ADL with minimal assist. SLP Goal #3: State current deficits since event with minimal assist.  SLP Goal #4: Complete functional problem solving target tasks with 80% accuracy with moderate verbal cues.   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, sports: Retired  IT consultant Overall Cognitive Status: Impaired Arousal/Alertness: Awake/alert Orientation Level: Disoriented to time;Oriented to person;Oriented to place;Oriented to situation Attention: Focused Focused Attention: Impaired Focused Attention Impairment: Verbal complex;Functional complex Memory: Impaired Memory Impairment: Decreased recall of new information Awareness: Impaired Awareness Impairment: Emergent impairment;Anticipatory impairment Problem  Solving: Impaired Problem Solving Impairment: Verbal complex;Functional complex Executive Function: Reasoning;Sequencing;Organizing;Self Correcting;Self Monitoring;Decision Making Reasoning: Impaired Reasoning Impairment: Verbal complex;Functional complex Sequencing: Impaired Sequencing Impairment: Functional basic;Verbal basic Organizing: Impaired Organizing Impairment: Verbal complex;Functional complex Decision Making: Impaired Decision Making Impairment: Verbal complex;Functional basic Self Monitoring: Appears intact Self Monitoring  Impairment: Functional basic;Functional complex;Verbal complex Self Correcting: Impaired Self Correcting Impairment: Verbal basic;Functional basic Behaviors: Impulsive;Perseveration;Confabulation Safety/Judgment: Impaired  Comprehension Auditory Comprehension Overall Auditory Comprehension: Impaired Yes/No Questions: Within Functional Limits Commands: Impaired Multistep Basic Commands: 25-49% accurate Conversation: Complex Interfering Components: Attention;Visual impairments;Processing speed EffectiveTechniques: Extra processing time;Repetition Visual Recognition/Discrimination Discrimination: Exceptions to [facility] Reading Comprehension Reading Status: Impaired Word level: Impaired Sentence Level: Impaired Paragraph Level: Not tested Functional Environmental (signs, name badge): Impaired Interfering Components: Visual acuity;Eye glasses not available  Expression Expression Primary Mode of Expression: Verbal Verbal Expression Overall Verbal Expression: Appears within functional limits for tasks assessed Written Expression Written Expression: Not tested  Oral/Motor Oral Motor/Sensory Function Overall Oral Motor/Sensory Function: Appears within functional limits for tasks assessed Moreen Fowler M.S., CCC-SLP [phone] [facility] 04/11/2011, 2:55 PM

## 2011-04-11 NOTE — Progress Notes (Signed)
Patient ID: Kurt Richard, male   DOB: 1945-12-12, 66 y.o.   MRN: [id number] Stroke Team Progress Note  HISTORY Kurt Richard is an 66 y.o. male who presented to the hospital with anterior wall STEMI and underwent a CABG X5 on 04-06-11. On 3/8 patient was noted to have a period of time where he was not responsive then became agitated. His BP dropped to 85/45 at that time. Per nurse, he has had similar episodes in the past (as mentioned by his partner) where he will become unresponsive with garbled speech over the last couple months. Due to recent spell S/P surgery neurology was asked to evaluate.  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  SUBJECTIVE Patient is setting in his chair with friend. Reports he is much better today. His friend confides that he has been very paranoid.  OBJECTIVE Most recent Vital Signs: Temp: 98.1 F (36.7 C) (03/10 0727) Temp src: Oral (03/10 0727) BP: 108/56 mmHg (03/10 1030) Pulse Rate: 98  (03/10 1030) Respiratory Rate: 15 O2 Saturation: 98%  CBG (last 3)   Basename 04/11/11 0712 04/10/11 2144 04/10/11 1621  GLUCAP 79 103* 95   Intake/Output from previous day: 03/09 0701 - 03/10 0700 In: 1440.8 [P.O.:450; I.V.:840.8; IV Piggyback:150] Out: 3895 [Urine:3895]  IV Fluid Intake:      . sodium chloride Stopped (04/08/11 1100)  . sodium chloride    . sodium chloride Stopped (04/09/11 0200)  . sodium chloride Stopped (04/08/11 2100)  . DOPamine    . lactated ringers 20 mL/hr at 04/09/11 0700  . DISCONTD: DOPamine 1.5 mcg/kg/min (04/11/11 1000)  . DISCONTD: phenylephrine (NEO-SYNEPHRINE) Adult infusion Stopped (04/11/11 0345)   Medications     . acetaminophen  1,000 mg Oral Q6H   Or  . acetaminophen (TYLENOL) oral liquid 160 mg/5 mL  975 mg Per Tube Q6H  . aspirin EC  325 mg Oral Daily   Or  . aspirin  324 mg Per Tube Daily  . atorvastatin  80 mg Oral q1800  . bisacodyl  10 mg Oral Daily   Or  . bisacodyl  10 mg Rectal Daily  . docusate sodium  200 mg Oral Daily  . enoxaparin (LOVENOX) injection  30 mg Subcutaneous Q24H  . insulin aspart  0-20 Units Subcutaneous TID WC  . insulin aspart  0-5 Units Subcutaneous QHS  . metoprolol tartrate  25 mg Per Tube BID   Or  . metoprolol tartrate  12.5 mg Oral BID  . pantoprazole  40 mg Oral Q1200  . potassium chloride  10 mEq Intravenous Q1 Hr x 2  . sodium chloride  3 mL Intravenous Q12H  . sodium chloride  3 mL Intravenous Q12H  PRN sodium chloride, albuterol, ipratropium, metoprolol, morphine, ondansetron (ZOFRAN) IV, oxyCODONE, potassium chloride, sodium chloride, sodium chloride  Diet:  Carb Control  Activity:  Bathroom privileges; Up with assistance DVT Prophylaxis:  Lovenox  Studies: CBC    Component Value Date/Time   WBC 9.0 04/11/2011 0408   RBC 3.06* 04/11/2011 0408   HGB 9.5* 04/11/2011 0408   HCT 26.6* 04/11/2011 0408   PLT 289 04/11/2011 0408   MCV 86.9 04/11/2011 0408   MCH 31.0 04/11/2011 0408   MCHC 35.7 04/11/2011 0408   RDW 14.1 04/11/2011 0408   LYMPHSABS 1.2 04/06/2011 0931   MONOABS 1.4* 04/06/2011 0931   EOSABS 0.0 04/06/2011 0931   BASOSABS 0.0 04/06/2011 0931   CMP  Component Value Date/Time   NA 130* 04/11/2011 0408   K 4.0 04/11/2011 0408   CL 93* 04/11/2011 0408   CO2 26 04/11/2011 0408   GLUCOSE 95 04/11/2011 0408   BUN 40* 04/11/2011 0408   CREATININE 2.06* 04/11/2011 0408   CALCIUM 8.2* 04/11/2011 0408   PROT 7.3 04/06/2011 0931   ALBUMIN 3.3* 04/06/2011 0931   AST 40* 04/06/2011 0931   ALT 11 04/06/2011 0931   ALKPHOS 84 04/06/2011 0931   BILITOT 1.2 04/06/2011 0931   GFRNONAA 32* 04/11/2011 0408   GFRAA 38* 04/11/2011 0408   COAGS Lab Results  Component Value Date   INR 1.49 04/06/2011   Lipid Panel    Component Value Date/Time   CHOL 120 04/09/2011 0430   TRIG 115 04/09/2011 0430   HDL 15* 04/09/2011 0430   CHOLHDL 8.0 04/09/2011 0430   VLDL 23 04/09/2011 0430   LDLCALC 82 04/09/2011 0430   HgbA1C  Lab  Results  Component Value Date   HGBA1C 8.2* 04/08/2011   Urine Drug Screen  No results found for this basename: labopia,  cocainscrnur,  labbenz,  amphetmu,  thcu,  labbarb    Alcohol Level No results found for this basename: eth     Results for orders placed during the hospital encounter of 04/06/11 (from the past 24 hour(s))  GLUCOSE, CAPILLARY     Status: Normal   Collection Time   04/10/11  4:21 PM      Component Value Range   Glucose-Capillary 95  70 - 99 (mg/dL)  GLUCOSE, CAPILLARY     Status: Abnormal   Collection Time   04/10/11  9:44 PM      Component Value Range   Glucose-Capillary 103 (*) 70 - 99 (mg/dL)  CBC     Status: Abnormal   Collection Time   04/11/11  4:08 AM      Component Value Range   WBC 9.0  4.0 - 10.5 (K/uL)   RBC 3.06 (*) 4.22 - 5.81 (MIL/uL)   Hemoglobin 9.5 (*) 13.0 - 17.0 (g/dL)   HCT 16.1 (*) 09.6 - 52.0 (%)   MCV 86.9  78.0 - 100.0 (fL)   MCH 31.0  26.0 - 34.0 (pg)   MCHC 35.7  30.0 - 36.0 (g/dL)   RDW 04.5  40.9 - 81.1 (%)   Platelets 289  150 - 400 (K/uL)  BASIC METABOLIC PANEL     Status: Abnormal   Collection Time   04/11/11  4:08 AM      Component Value Range   Sodium 130 (*) 135 - 145 (mEq/L)   Potassium 4.0  3.5 - 5.1 (mEq/L)   Chloride 93 (*) 96 - 112 (mEq/L)   CO2 26  19 - 32 (mEq/L)   Glucose, Bld 95  70 - 99 (mg/dL)   BUN 40 (*) 6 - 23 (mg/dL)   Creatinine, Ser 9.14 (*) 0.50 - 1.35 (mg/dL)   Calcium 8.2 (*) 8.4 - 10.5 (mg/dL)   GFR calc non Af Amer 32 (*) >90 (mL/min)   GFR calc Af Amer 38 (*) >90 (mL/min)  GLUCOSE, CAPILLARY     Status: Normal   Collection Time   04/11/11  7:12 AM      Component Value Range   Glucose-Capillary 79  70 - 99 (mg/dL)    Dg Chest Port 1 View  04/10/2011  *RADIOLOGY REPORT*  Clinical Data: Status post CABG.  PORTABLE CHEST - 1 VIEW  Comparison: 04/09/2011  Findings: Right jugular sheath  remains.  Residual mild edema is stable.  Stable left lower lobe atelectasis.  Slight increase in right lower lung  atelectasis.  No pneumothorax or significant pleural effusions.  Stable cardiomegaly.  IMPRESSION: Increase in right lower lung atelectasis.  No pneumothorax identified.  Original Report Authenticated By: Reola Calkins, M.D.   Physical Exam   GENERAL:   Well nourished, well hydrated, no acute distress.   CARDIOVASCULAR:   Regular rate and rhythm, no thrills or palpable murmurs, S1, S2, no murmur, no rubs or gallops.      Carotid arteries: No carotid bruits.   MENTAL STATUS EXAM:    Orientation:  Alert and oriented to person, place and time.      Language:  Speech is clear; rare word-finding difficulty.   CRANIAL NERVES:     CN 2 (Optic):  Dense left homonymous hemianopsia.     CN 3,4,6 (EOM):  Pupils equal and reactive to light and near full eye movement without nystagmus.      CN 5 (Trigeminal):  Facial sensation is normal, no weakness of masticatory muscles.      CN 7 (Facial):  No facial weakness or asymmetry.      CN 8 (Auditory):  Auditory acuity grossly normal.      CN 9,10 (Glossophar):  The uvula is midline, the palate elevates symmetrically.      CN 11 (spinal access):  Normal sternocleidomastoid and trapezius strength.      CN 12 (Hypoglossal):  The tongue is midline. No atrophy or fasciculations.   MOTOR:    Full and symmetric strength throughout.  REFLEXES:     Triceps:                 (R): 2+  (L): 2+      Biceps:                  (R): 2+  (L): 2+      Brachioradialis:     (R): 2+  (L): 2+      Patellar:                 (R): 1+  (L): 1+      Achilles:                 (R): 1+  (L): 1+      Babinski:    (R): absent  (L): absent   COORDINATION:     Intact finger-to-nose.  SENSATION:     Intact to light touch.   GAIT:     Deferred.  ASSESSMENT Mr. SHERIDAN GETTEL is a 66 y.o. 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 Preliminary TCD and CUS studies are unremarkable but final reports are pending. Patient cannot get an MRI due  to recent CABG. His renal function is improving but still not safe for a contrasted CT.  Hospital day # 5  TREATMENT/PLAN Continue aspirin 325 mg orally every day for secondary stroke prevention.  Continue PT/OT/ST as tolerated. Will follow up final TCD and carotid results.  Kipp Laurence, MD [facility] Pager: [phone] 04/11/2011 12:24 PM

## 2011-04-11 NOTE — Progress Notes (Signed)
TCTS BRIEF SICU PROGRESS NOTE  5 Days Post-Op  S/P Procedure(s) (LRB): CORONARY ARTERY BYPASS GRAFTING (CABG) (N/A)   Stable day UOP increased since dopamine increased to 3 mcg/kg/min  Plan: Continue current plan  Kurt Richard H 04/11/2011 7:14 PM

## 2011-04-12 ENCOUNTER — Inpatient Hospital Stay (HOSPITAL_COMMUNITY): Payer: 59

## 2011-04-12 DIAGNOSIS — I517 Cardiomegaly: Secondary | ICD-10-CM

## 2011-04-12 LAB — COMPREHENSIVE METABOLIC PANEL
ALT: 11 U/L (ref 0–53)
AST: 21 U/L (ref 0–37)
Alkaline Phosphatase: 132 U/L — ABNORMAL HIGH (ref 39–117)
CO2: 24 mEq/L (ref 19–32)
Calcium: 8 mg/dL — ABNORMAL LOW (ref 8.4–10.5)
Chloride: 94 mEq/L — ABNORMAL LOW (ref 96–112)
GFR calc Af Amer: 31 mL/min — ABNORMAL LOW (ref >90)
GFR calc non Af Amer: 27 mL/min — ABNORMAL LOW (ref >90)
Glucose, Bld: 105 mg/dL — ABNORMAL HIGH (ref 70–99)
Sodium: 131 mEq/L — ABNORMAL LOW (ref 135–145)
Total Bilirubin: 0.5 mg/dL (ref 0.3–1.2)

## 2011-04-12 LAB — POCT I-STAT 4, (NA,K, GLUC, HGB,HCT)
Glucose, Bld: 225 mg/dL — ABNORMAL HIGH (ref 70–99)
Glucose, Bld: 111 mg/dL — ABNORMAL HIGH (ref 70–99)
HCT: 33 % — ABNORMAL LOW (ref 39.0–52.0)
HCT: 23 % — ABNORMAL LOW (ref 39.0–52.0)
Hemoglobin: 11.2 g/dL — ABNORMAL LOW (ref 13.0–17.0)
Hemoglobin: 7.8 g/dL — ABNORMAL LOW (ref 13.0–17.0)
Hemoglobin: 8.5 g/dL — ABNORMAL LOW (ref 13.0–17.0)
Potassium: 4.7 mEq/L (ref 3.5–5.1)
Potassium: 4.6 mEq/L (ref 3.5–5.1)
Sodium: 132 mEq/L — ABNORMAL LOW (ref 135–145)
Sodium: 133 mEq/L — ABNORMAL LOW (ref 135–145)

## 2011-04-12 LAB — POCT I-STAT 3, ART BLOOD GAS (G3+)
Bicarbonate: 20.4 mEq/L (ref 20.0–24.0)
O2 Saturation: 100 %
TCO2: 22 mmol/L (ref 0–100)
pCO2 arterial: 47.6 mmHg — ABNORMAL HIGH (ref 35.0–45.0)
pH, Arterial: 7.24 — ABNORMAL LOW (ref 7.350–7.450)
pO2, Arterial: 376 mmHg — ABNORMAL HIGH (ref 80.0–100.0)

## 2011-04-12 LAB — CBC
Hemoglobin: 9 g/dL — ABNORMAL LOW (ref 13.0–17.0)
MCH: 31 pg (ref 26.0–34.0)
Platelets: 319 10*3/uL (ref 150–400)
RBC: 2.9 MIL/uL — ABNORMAL LOW (ref 4.22–5.81)
WBC: 7.4 10*3/uL (ref 4.0–10.5)

## 2011-04-12 LAB — GLUCOSE, CAPILLARY
Glucose-Capillary: 185 mg/dL — ABNORMAL HIGH (ref 70–99)
Glucose-Capillary: 110 mg/dL — ABNORMAL HIGH (ref 70–99)

## 2011-04-12 MED ORDER — METOPROLOL TARTRATE 12.5 MG HALF TABLET
12.5000 mg | ORAL_TABLET | Freq: Two times a day (BID) | ORAL | Status: DC
Start: 2011-04-12 — End: 2011-04-13
  Administered 2011-04-12 (×2): 12.5 mg via ORAL
  Filled 2011-04-12 (×4): qty 1

## 2011-04-12 MED ORDER — METOPROLOL TARTRATE 25 MG/10 ML ORAL SUSPENSION
12.5000 mg | Freq: Two times a day (BID) | ORAL | Status: DC
  Filled 2011-04-12 (×4): qty 5

## 2011-04-12 MED FILL — Sodium Bicarbonate IV Soln 8.4%: INTRAVENOUS | Qty: 50 | Status: AC

## 2011-04-12 MED FILL — Sodium Chloride IV Soln 0.9%: INTRAVENOUS | Qty: 1000 | Status: AC

## 2011-04-12 MED FILL — Sodium Chloride Irrigation Soln 0.9%: Qty: 3000 | Status: AC

## 2011-04-12 MED FILL — Lidocaine HCl IV Inj 20 MG/ML: INTRAVENOUS | Qty: 5 | Status: AC

## 2011-04-12 MED FILL — Mannitol IV Soln 20%: INTRAVENOUS | Qty: 500 | Status: AC

## 2011-04-12 MED FILL — Heparin Sodium (Porcine) Inj 1000 Unit/ML: INTRAMUSCULAR | Qty: 10 | Status: AC

## 2011-04-12 MED FILL — Electrolyte-R (PH 7.4) Solution: INTRAVENOUS | Qty: 5000 | Status: AC

## 2011-04-12 MED FILL — Heparin Sodium (Porcine) Inj 1000 Unit/ML: INTRAMUSCULAR | Qty: 30 | Status: AC

## 2011-04-12 MED FILL — Calcium Chloride Inj 10%: INTRAVENOUS | Qty: 10 | Status: AC

## 2011-04-12 NOTE — Progress Notes (Signed)
Physical Therapy Treatment Patient Details Name: Kurt Richard MRN: [id number] DOB: April 29, 1945 Today's Date: 04/12/2011  PT Assessment/Plan  PT - Assessment/Plan Comments on Treatment Session: Pt s/p CABG and remote RPCA infarct. Pt educated for sternal precautions and able to recall all 3 end of session. Pt educated for stroke risk factors and symptoms, pt only able to state weakness and trouble speaking as symptoms before education. Pt educated for scanning of environment before and during ambulation and 2x during gait either ran into object on left or narrowly missed it because pt not scanning to left and unaware of room to shift self toward left. Physically the pt is moving well will attempt further balance assessment and stairs next session. PT Plan: Discharge plan remains appropriate;Frequency remains appropriate PT Goals  Acute Rehab PT Goals PT Goal: Sit to Stand - Progress: Progressing toward goal PT Goal: Stand to Sit - Progress: Progressing toward goal Pt will Ambulate: with modified independence;with least restrictive assistive device;>150 feet PT Goal: Ambulate - Progress: Updated due to goal met PT Goal: Up/Down Stairs - Progress: Progressing toward goal Additional Goals PT Goal: Additional Goal #1 - Progress: Progressing toward goal  PT Treatment Precautions/Restrictions  Precautions Precautions: Fall;Sternal Mobility (including Balance) Bed Mobility Bed Mobility: No Transfers Sit to Stand: 4: Min assist;From chair/3-in-1 Sit to Stand Details (indicate cue type and reason): minguard with VC and tactile cueing for hand placement and anterior weight shift to stand Stand to Sit: 5: Supervision;To chair/3-in-1 Stand to Sit Details: cueing for hand placement and safety Ambulation/Gait Ambulation/Gait Assistance: 5: Supervision Ambulation/Gait Assistance Details (indicate cue type and reason): cueing to step into the RW at initiation of gait, VC for room number even after  initial instruction and cues to attend to environment Ambulation Distance (Feet): 300 Feet Assistive device: Rolling Gainor Gait Pattern: Step-through pattern;Decreased stride length Stairs: No  Posture/Postural Control Posture/Postural Control: No significant limitations Exercise  General Exercises - Lower Extremity Long Arc Quad: AROM;Both;20 reps;Seated Hip Flexion/Marching: AROM;Both;Other reps (comment);Seated (20reps) Heel Raises: AROM;Both;Other reps (comment);Seated (20reps) End of Session PT - End of Session Equipment Utilized During Treatment: Gait belt Activity Tolerance: Patient tolerated treatment well Patient left: in chair;with call bell in reach;Other (comment) (sitter present) Nurse Communication: Mobility status for transfers;Mobility status for ambulation General Behavior During Session: Flat affect Cognition: Impaired Cognitive Impairment: Pt with decreased problem solving, safety awareness and executive function. Pt unable to solve simple money management, pt required 3 attempts to spell world backward.   Toney Sang Beth 04/12/2011, 1:17 PM Delaney Meigs, PT [phone]

## 2011-04-12 NOTE — Progress Notes (Signed)
Subjective:  Kurt Richard is an 66 y.o. male who presented to the hospital with anterior wall STEMI and underwent a CABG X5 on 04-06-11. Today patient was noted to have a period of time where he was not responsive then becoming agitated. His BP dropped to 85/45 at that time. Per nurse, he has had similar episodes in the past (as mentioned by his partner) where he will become unresponsive with garbled speech over the last couple months. Due to recent spell S/P surgery neurology was asked to evaluate.  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 He has remained stable over the weekend without any new neurological complaints Objective: Vital signs in last 24 hours: Temp:  [98.1 F (36.7 C)-98.9 F (37.2 C)] 98.1 F (36.7 C) (03/11 0724) Pulse Rate:  [69-130] 102  (03/11 1100) Resp:  [12-26] 15  (03/11 1100) BP: (88-139)/(53-88) 113/58 mmHg (03/11 1100) SpO2:  [94 %-100 %] 98 % (03/11 1100) Weight:  [74.3 kg (163 lb 12.8 oz)] 74.3 kg (163 lb 12.8 oz) (03/11 0500) Weight change: -0.6 kg (-1 lb 5.2 oz) Last BM Date: 04/12/11  Intake/Output from previous day: 03/10 0701 - 03/11 0700 In: 1238.7 [P.O.:700; I.V.:538.7] Out: 2220 [Urine:2220] Intake/Output this shift: Total I/O In: 272.6 [P.O.:200; I.V.:72.6] Out: -   Physical exam pleasant elderly caucasian male not in  Distress. CABG scar in midline chest. Rt neck has IJ catheter. Hearing mildly decreased. Cardiac exam tachycardia no murmur. Lungs clear Neurological exam ; Awake  Alert oriented x 2.slow to respond. 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 Face symmetric. Tongue midline. Normal strength, tone, reflexes and coordination. Normal sensation. Gait deferred.   Lab Results:  [facility] 04/12/11 0410 04/11/11 0408  WBC 7.4 9.0  HGB 9.0* 9.5*  HCT  25.8* 26.6*  PLT 319 289   BMET  Basename 04/12/11 0410 04/11/11 0408  NA 131* 130*  K 4.1 4.0  CL 94* 93*  CO2 24 26  GLUCOSE 105* 95  BUN 40* 40*  CREATININE 2.40* 2.06*  CALCIUM 8.0* 8.2*    Studies/Results: Dg Chest Port 1 View  04/12/2011  *RADIOLOGY REPORT*  Clinical Data: Postop open heart surgery.  PORTABLE CHEST - 1 VIEW  Comparison: 04/10/2011  Findings: Prior CABG.  Cardiomegaly.  Improving bibasilar atelectasis or infiltrates.  Small bilateral effusions.  No pneumothorax.  IMPRESSION: Improving but persistent bibasilar atelectasis or infiltrates. Small effusions.  Original Report Authenticated By: Cyndie Chime, M.D.     MRI examination of the brain cannot do due to recent CABG  Lipids normal HbA1c 8.2%  Medications: I have reviewed the patient's current medications.  Assessment/Plan: 66 year male with recent MI and CABG with altered mental status vision deficits improving suspect new left PCA infarct cardioembolic . Old rt PCA infarct on CT of remote age. Plan ; cannot do MRI due to recent cardiac surgery but suspect stroke is perioperative. Continue aspirin. Check CUS, TCD,   A repeat CT scan of the head today Stroke team will follow. PT,OT consults  LOS: 6 days   SETHI,PRAMODKUMAR P 04/12/2011, 11:20 AM

## 2011-04-12 NOTE — Progress Notes (Signed)
*  PRELIMINARY RESULTS* Echocardiogram 2D Echocardiogram has been performed.  Glean Salen [facility] 04/12/2011, 3:16 PM

## 2011-04-12 NOTE — Progress Notes (Signed)
6 Days Post-Op Procedure(s) (LRB): CORONARY ARTERY BYPASS GRAFTING (CABG) (N/A) Subjective: No c/o this AM Ambulated 200 feet with assistance Denies pain, nausea, appetite fair  Objective: Vital signs in last 24 hours: Temp:  [98.1 F (36.7 C)-98.9 F (37.2 C)] 98.1 F (36.7 C) (03/11 0724) Pulse Rate:  [69-130] 107  (03/11 0700) Cardiac Rhythm:  [-] Sinus tachycardia (03/11 0700) Resp:  [12-26] 17  (03/11 0700) BP: (85-135)/(47-88) 120/58 mmHg (03/11 0700) SpO2:  [94 %-100 %] 97 % (03/11 0700) Weight:  [163 lb 12.8 oz (74.3 kg)] 163 lb 12.8 oz (74.3 kg) (03/11 0500)  Hemodynamic parameters for last 24 hours:    Intake/Output from previous day: 03/10 0701 - 03/11 0700 In: 1238.7 [P.O.:700; I.V.:538.7] Out: 2220 [Urine:2220] Intake/Output this shift:    General appearance: alert and cooperative Neurologic: unchanged Heart: regular rate and rhythm Lungs: clear to auscultation bilaterally Extremities: edema trace Wound: clean and dry  Lab Results:  Basename 04/12/11 0410 04/11/11 0408  WBC 7.4 9.0  HGB 9.0* 9.5*  HCT 25.8* 26.6*  PLT 319 289   BMET:  Basename 04/12/11 0410 04/11/11 0408  NA 131* 130*  K 4.1 4.0  CL 94* 93*  CO2 24 26  GLUCOSE 105* 95  BUN 40* 40*  CREATININE 2.40* 2.06*  CALCIUM 8.0* 8.2*    PT/INR: No results found for this basename: LABPROT,INR in the last 72 hours ABG    Component Value Date/Time   PHART 7.389 04/08/2011 1458   HCO3 17.2* 04/08/2011 1458   TCO2 18 04/08/2011 1458   ACIDBASEDEF 7.0* 04/08/2011 1458   O2SAT 88.0 04/08/2011 1458   CBG (last 3)   Basename 04/11/11 2138 04/11/11 1746 04/11/11 1256  GLUCAP 105* 107* 135*    Assessment/Plan: S/P Procedure(s) (LRB): CORONARY ARTERY BYPASS GRAFTING (CABG) (N/A) - CV- stable on dopamine gtt, will try to gradually wean off DA, check echo to evaluate LV function Resp- continue pulmonary toilet Renal- uo excellent even without diuretic, but creatinine up this AM, follow UO and  creatinine as dopamine weaned, keep foley to monitor UO CVA- no further acute events over the weekend, continue PT/OT Keep in ICU due to dopamine, renal function    LOS: 6 days    HENDRICKSON,STEVEN C 04/12/2011

## 2011-04-13 LAB — GLUCOSE, CAPILLARY
Glucose-Capillary: 159 mg/dL — ABNORMAL HIGH (ref 70–99)
Glucose-Capillary: 191 mg/dL — ABNORMAL HIGH (ref 70–99)

## 2011-04-13 LAB — BASIC METABOLIC PANEL
BUN: 40 mg/dL — ABNORMAL HIGH (ref 6–23)
CO2: 23 mEq/L (ref 19–32)
Chloride: 94 mEq/L — ABNORMAL LOW (ref 96–112)
GFR calc Af Amer: 39 mL/min — ABNORMAL LOW (ref >90)
Potassium: 3.9 mEq/L (ref 3.5–5.1)

## 2011-04-13 LAB — CBC
HCT: 23.2 % — ABNORMAL LOW (ref 39.0–52.0)
Hemoglobin: 8.1 g/dL — ABNORMAL LOW (ref 13.0–17.0)
RBC: 2.63 MIL/uL — ABNORMAL LOW (ref 4.22–5.81)
RDW: 14.4 % (ref 11.5–15.5)
WBC: 8.9 10*3/uL (ref 4.0–10.5)

## 2011-04-13 MED ORDER — METOPROLOL SUCCINATE ER 25 MG PO TB24
25.0000 mg | ORAL_TABLET | Freq: Every day | ORAL | Status: DC
  Administered 2011-04-13 – 2011-04-16 (×4): 25 mg via ORAL
  Filled 2011-04-13 (×4): qty 1

## 2011-04-13 MED ORDER — IPRATROPIUM BROMIDE 0.02 % IN SOLN: 0.5000 mg | Freq: Four times a day (QID) | RESPIRATORY_TRACT | Status: DC | PRN

## 2011-04-13 MED ORDER — FUROSEMIDE 40 MG PO TABS
40.0000 mg | ORAL_TABLET | Freq: Every day | ORAL | Status: DC
  Administered 2011-04-13 – 2011-04-16 (×4): 40 mg via ORAL
  Filled 2011-04-13 (×4): qty 1

## 2011-04-13 MED ORDER — ALUM & MAG HYDROXIDE-SIMETH 200-200-20 MG/5ML PO SUSP: 15.0000 mL | ORAL | Status: DC | PRN

## 2011-04-13 MED ORDER — ALPRAZOLAM 0.25 MG PO TABS: 0.2500 mg | ORAL_TABLET | Freq: Four times a day (QID) | ORAL | Status: DC | PRN

## 2011-04-13 MED ORDER — SODIUM CHLORIDE 0.9 % IJ SOLN: 3.0000 mL | INTRAMUSCULAR | Status: DC | PRN

## 2011-04-13 MED ORDER — MOVING RIGHT ALONG BOOK
Freq: Once | Status: AC
  Administered 2011-04-13: 18:00:00
  Filled 2011-04-13: qty 1

## 2011-04-13 MED ORDER — POTASSIUM CHLORIDE CRYS ER 20 MEQ PO TBCR
20.0000 meq | EXTENDED_RELEASE_TABLET | Freq: Every day | ORAL | Status: DC
  Administered 2011-04-13 – 2011-04-16 (×4): 20 meq via ORAL
  Filled 2011-04-13 (×4): qty 1

## 2011-04-13 MED ORDER — SODIUM CHLORIDE 0.9 % IJ SOLN
3.0000 mL | Freq: Two times a day (BID) | INTRAMUSCULAR | Status: DC
  Administered 2011-04-13 – 2011-04-16 (×6): 3 mL via INTRAVENOUS

## 2011-04-13 MED ORDER — ALBUTEROL SULFATE (5 MG/ML) 0.5% IN NEBU: 2.5000 mg | INHALATION_SOLUTION | Freq: Four times a day (QID) | RESPIRATORY_TRACT | Status: DC | PRN

## 2011-04-13 MED ORDER — MAGNESIUM HYDROXIDE 400 MG/5ML PO SUSP: 30.0000 mL | Freq: Every day | ORAL | Status: DC | PRN

## 2011-04-13 MED ORDER — TRAMADOL HCL 50 MG PO TABS
50.0000 mg | ORAL_TABLET | ORAL | Status: DC | PRN
  Administered 2011-04-15: 50 mg via ORAL
  Filled 2011-04-13: qty 1

## 2011-04-13 MED ORDER — SODIUM CHLORIDE 0.9 % IV SOLN: 250.0000 mL | INTRAVENOUS | Status: DC | PRN

## 2011-04-13 MED FILL — Perflutren Lipid Microsphere IV Susp 6.52 MG/ML: INTRAVENOUS | Qty: 2 | Status: AC

## 2011-04-13 NOTE — Progress Notes (Signed)
Physical Therapy Treatment Patient Details Name: Kurt Richard MRN: [id number] DOB: [birth year] Today's Date: 04/13/2011  PT Assessment/Plan  PT - Assessment/Plan Comments on Treatment Session: Pt s/p CABG. Pt able to recall all 3 sternal precautions. Pt continues to need cues to scan environment, continues to walk closely to items on right and unable to judge space available for ambulation. Deferred further balance testing secondary to pt fatigue. PT Plan: Discharge plan needs to be updated Follow Up Recommendations: Home health PT;Supervision for mobility/OOB PT Goals  Acute Rehab PT Goals PT Goal: Supine/Side to Sit - Progress: Progressing toward goal PT Goal: Sit to Supine/Side - Progress: Progressing toward goal PT Goal: Sit to Stand - Progress: Progressing toward goal PT Goal: Stand to Sit - Progress: Progressing toward goal PT Goal: Ambulate - Progress: Progressing toward goal PT Goal: Up/Down Stairs - Progress: Progressing toward goal Additional Goals PT Goal: Additional Goal #1 - Progress: Met  PT Treatment Precautions/Restrictions  Precautions Precautions: Sternal;Fall Restrictions Weight Bearing Restrictions: No Mobility (including Balance) Bed Mobility Rolling Left: 5: Supervision Rolling Left Details (indicate cue type and reason): cues not to pull on rail Left Sidelying to Sit: 5: Supervision;HOB flat Left Sidelying to Sit Details (indicate cue type and reason): cues for sequence Transfers Sit to Stand: 5: Supervision;From bed Sit to Stand Details (indicate cue type and reason): cueing for hand placement Stand to Sit: 5: Supervision;To chair/3-in-1 Ambulation/Gait Ambulation/Gait Assistance: 4: Min assist Ambulation/Gait Assistance Details (indicate cue type and reason): min assist for balance, ambulated without a [place] and pt with 4 periods of unsteady gait requiring tactile cues to assist with recovery Ambulation Distance (Feet): 400 Feet Assistive device:  None Gait Pattern: Decreased stride length;Step-through pattern Stairs: No  Posture/Postural Control Posture/Postural Control: No significant limitations Balance Balance Assessed: Yes Static Standing Balance Static Standing - Balance Support: No upper extremity supported Static Standing - Level of Assistance: 5: Stand by assistance Exercise  General Exercises - Lower Extremity Long Arc Quad: AROM;15 reps;Both;Seated Hip ABduction/ADduction: AROM;Both;15 reps;Seated Hip Flexion/Marching: AROM;Seated;Other reps (comment) (15 reps) End of Session PT - End of Session Activity Tolerance: Patient limited by fatigue Patient left: in chair;with call bell in reach;with family/visitor present Nurse Communication: Mobility status for transfers;Mobility status for ambulation General Behavior During Session: Flat affect Cognition: Impaired  Delorse Lek 04/13/2011, 5:33 PM Delaney Meigs, PT [phone]

## 2011-04-13 NOTE — Progress Notes (Signed)
7 Days Post-Op Procedure(s) (LRB): CORONARY ARTERY BYPASS GRAFTING (CABG) (N/A) Subjective: No complaint this AM  Objective: Vital signs in last 24 hours: Temp:  [98 F (36.7 C)-98.9 F (37.2 C)] 98.3 F (36.8 C) (03/12 0734) Pulse Rate:  [96-111] 98  (03/12 0734) Cardiac Rhythm:  [-] Normal sinus rhythm (03/12 0400) Resp:  [11-20] 17  (03/12 0734) BP: (88-139)/(51-79) 106/56 mmHg (03/12 0734) SpO2:  [95 %-100 %] 98 % (03/12 0734) Weight:  [165 lb 5.5 oz (75 kg)] 165 lb 5.5 oz (75 kg) (03/12 0500)  Hemodynamic parameters for last 24 hours:    Intake/Output from previous day: 03/11 0701 - 03/12 0700 In: 1235 [P.O.:740; I.V.:495] Out: 771 [Urine:770; Stool:1] Intake/Output this shift:    General appearance: alert and distracted Neurologic: unchanged Heart: regular rate and rhythm Lungs: diminished breath sounds bibasilar Wound: clean and dry  Lab Results:  Basename 04/13/11 0355 04/12/11 0410  WBC 8.9 7.4  HGB 8.1* 9.0*  HCT 23.2* 25.8*  PLT 329 319   BMET:  Basename 04/13/11 0355 04/12/11 0410  NA 130* 131*  K 3.9 4.1  CL 94* 94*  CO2 23 24  GLUCOSE 146* 105*  BUN 40* 40*  CREATININE 2.00* 2.40*  CALCIUM 7.6* 8.0*    PT/INR: No results found for this basename: LABPROT,INR in the last 72 hours ABG    Component Value Date/Time   PHART 7.389 04/08/2011 1458   HCO3 17.2* 04/08/2011 1458   TCO2 18 04/08/2011 1458   ACIDBASEDEF 7.0* 04/08/2011 1458   O2SAT 88.0 04/08/2011 1458   CBG (last 3)   Basename 04/12/11 2120 04/12/11 1614 04/12/11 1124  GLUCAP 177* 110* 185*    Assessment/Plan: S/P Procedure(s) (LRB): CORONARY ARTERY BYPASS GRAFTING (CABG) (N/A) Plan for transfer to step-down: see transfer orders CV- stable off dopamine. S/p MI with severe LV dysfunction. 2D echo showed EF 30-35%, better than preop  Resp- pulmonary toilet  Renal- Creatinine 2.0 - down from 2.4 with dopamine off  CVA- repeat head CT still showing old stroke, continue  PT/OT  Transfer to PTCU   LOS: 7 days    Blase Beckner C 04/13/2011

## 2011-04-13 NOTE — Progress Notes (Signed)
Physical medicine and rehabilitation consult requested. Patient with anterior wall STEMI and underwent CABG x5 on March 5. Questionable left PCA infarction postoperatively. CT of the head March 11 with no evidence of acute infarction. Patient is presently ambulating 300 feet rolling [place] and supervision. Patient would not qualify for inpatient rehabilitation services at this time. Will discuss with case manager. Will hold on formal rehabilitation consult at this time and discuss with team.

## 2011-04-13 NOTE — Progress Notes (Signed)
Subjective:  Kurt Richard is an 66 y.o. male who presented to the hospital with anterior wall STEMI and underwent a CABG X5 on 04-06-11. Today patient was noted to have a period of time where he was not responsive then becoming agitated. His BP dropped to 85/45 at that time. Per nurse, he has had similar episodes in the past (as mentioned by his partner) where he will become unresponsive with garbled speech over the last couple months. Due to recent spell S/P surgery neurology was asked to evaluate.  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 He has remained stable over the weekend without any new neurological complaints.No new complaints today. Plan to transfer to floor today. Objective: Vital signs in last 24 hours: Temp:  [98 F (36.7 C)-98.9 F (37.2 C)] 98.3 F (36.8 C) (03/12 0800) Pulse Rate:  [92-105] 92  (03/12 1100) Resp:  [11-20] 14  (03/12 1100) BP: (98-124)/(50-79) 106/57 mmHg (03/12 1100) SpO2:  [95 %-100 %] 97 % (03/12 1100) Weight:  [75 kg (165 lb 5.5 oz)] 75 kg (165 lb 5.5 oz) (03/12 0500) Weight change: 0.7 kg (1 lb 8.7 oz) Last BM Date: 04/12/11  Intake/Output from previous day: 03/11 0701 - 03/12 0700 In: 1235 [P.O.:740; I.V.:495] Out: 771 [Urine:770; Stool:1] Intake/Output this shift: Total I/O In: 400 [P.O.:360; I.V.:40] Out: 250 [Urine:250]  Physical exam pleasant elderly caucasian male not in  Distress. CABG scar in midline chest. Rt neck has IJ catheter. Hearing mildly decreased. Cardiac exam tachycardia no murmur. Lungs clear Neurological exam ; Awake  Alert oriented x 2.slow to respond. 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 Face symmetric. Tongue midline. Normal strength, tone, reflexes and coordination. Normal sensation. Gait deferred.   Lab Results:  [facility] 04/13/11  0355 04/12/11 0410  WBC 8.9 7.4  HGB 8.1* 9.0*  HCT 23.2* 25.8*  PLT 329 319   BMET  Basename 04/13/11 0355 04/12/11 0410  NA 130* 131*  K 3.9 4.1  CL 94* 94*  CO2 23 24  GLUCOSE 146* 105*  BUN 40* 40*  CREATININE 2.00* 2.40*  CALCIUM 7.6* 8.0*    Studies/Results: Ct Head Wo Contrast  04/13/2011  *RADIOLOGY REPORT*  Clinical Data: Altered mental status, recent CABG.  CT HEAD WITHOUT CONTRAST  Technique:  Contiguous axial images were obtained from the base of the skull through the vertex without contrast.  Comparison: 04/08/2011  Findings: Hypoattenuation within the right occipital lobe, in keeping with subacute to remote infarction.  The gray-white distinction at the same level on the left side is indistinct however likely because it is obscured by streak artifact.  There is no evidence for acute hemorrhage, hydrocephalus, mass lesion, or abnormal extra-axial fluid collection.  No definite CT evidence for acute infarction.  The visualized paranasal sinuses and mastoid air cells are predominately clear.  IMPRESSION: Right occipital lobe hypoattenuation is similar to prior.  No definite acute intracranial abnormality.  Original Report Authenticated By: Waneta Martins, M.D.   Dg Chest Port 1 View  04/12/2011  *RADIOLOGY REPORT*  Clinical Data: Postop open heart surgery.  PORTABLE CHEST - 1 VIEW  Comparison: 04/10/2011  Findings: Prior CABG.  Cardiomegaly.  Improving bibasilar atelectasis or infiltrates.  Small bilateral effusions.  No pneumothorax.  IMPRESSION: Improving but persistent bibasilar atelectasis or infiltrates. Small effusions.  Original Report Authenticated By: Aubery Lapping  DOVER, M.D.     MRI examination of the brain cannot do due to recent CABG  Lipids normal HbA1c 8.2% CT head 04/12/11 old rt occipital infarct . No new infarct Medications: I have reviewed the patient's current medications.  Assessment/Plan: 66 year male with recent MI and CABG with altered mental status  vision deficits improving suspect new left PCA infarct cardioembolic . Old rt PCA infarct on CT of remote age. Plan ; cannot do MRI due to recent cardiac surgery but suspect stroke is perioperative.Ok to not do dopplers as he had Ct angio. Continue aspirin. Stroke team wuill sign off. call for questions. LOS: 7 days   Mickel Schreur,PRAMODKUMAR P 04/13/2011, 11:29 AM

## 2011-04-14 ENCOUNTER — Inpatient Hospital Stay (HOSPITAL_COMMUNITY): Payer: 59

## 2011-04-14 DIAGNOSIS — R000 Tachycardia, unspecified: Secondary | ICD-10-CM

## 2011-04-14 DIAGNOSIS — I219 Acute myocardial infarction, unspecified: Secondary | ICD-10-CM

## 2011-04-14 LAB — BASIC METABOLIC PANEL
Chloride: 94 mEq/L — ABNORMAL LOW (ref 96–112)
GFR calc Af Amer: 42 mL/min — ABNORMAL LOW (ref >90)
GFR calc non Af Amer: 37 mL/min — ABNORMAL LOW (ref >90)
Potassium: 4.4 mEq/L (ref 3.5–5.1)
Sodium: 132 mEq/L — ABNORMAL LOW (ref 135–145)

## 2011-04-14 LAB — CBC
HCT: 25.2 % — ABNORMAL LOW (ref 39.0–52.0)
Hemoglobin: 8.6 g/dL — ABNORMAL LOW (ref 13.0–17.0)
MCH: 30.4 pg (ref 26.0–34.0)
MCHC: 34.1 g/dL (ref 30.0–36.0)
MCV: 89 fL (ref 78.0–100.0)
Platelets: 446 K/uL — ABNORMAL HIGH (ref 150–400)
RBC: 2.83 MIL/uL — ABNORMAL LOW (ref 4.22–5.81)
RDW: 14.6 % (ref 11.5–15.5)
WBC: 10.3 K/uL (ref 4.0–10.5)

## 2011-04-14 LAB — GLUCOSE, CAPILLARY
Glucose-Capillary: 168 mg/dL — ABNORMAL HIGH (ref 70–99)
Glucose-Capillary: 95 mg/dL (ref 70–99)

## 2011-04-14 NOTE — Progress Notes (Signed)
CARDIAC REHAB PHASE I   PRE:  Rate/Rhythm: 93SR  BP:  Supine:   Sitting: 110/60  Standing:    SaO2: 99%RA  MODE:  Ambulation: 350 ft   POST:  Rate/Rhythem: 111ST  BP:  Supine:   Sitting: 121/61  Standing:    SaO2: 100%RA 1430-1505 Pt walked 350 ft on RA with rolling [place] and asst x 1. Tolerated well. To bed after walk.  Duanne Limerick

## 2011-04-14 NOTE — Progress Notes (Signed)
   Subjective:  Feels well this am.  No chest pain or dyspnea.   Slowly gaining strength.  Objective:  Vital Signs in the last 24 hours: Temp:  [97.6 F (36.4 C)-98.3 F (36.8 C)] 97.6 F (36.4 C) (03/13 0548) Pulse Rate:  [92-116] 93  (03/13 0548) Resp:  [11-26] 18  (03/13 0548) BP: (106-128)/(50-73) 117/60 mmHg (03/13 0548) SpO2:  [97 %-99 %] 98 % (03/13 0548) Weight:  [164 lb 14.5 oz (74.8 kg)] 164 lb 14.5 oz (74.8 kg) (03/13 0540)  Intake/Output from previous day: 03/12 0701 - 03/13 0700 In: 563 [P.O.:520; I.V.:43] Out: 361 [Urine:360; Stool:1] Intake/Output from this shift:       . aspirin EC  325 mg Oral Daily   Or  . aspirin  324 mg Per Tube Daily  . atorvastatin  80 mg Oral q1800  . bisacodyl  10 mg Oral Daily   Or  . bisacodyl  10 mg Rectal Daily  . docusate sodium  200 mg Oral Daily  . enoxaparin (LOVENOX) injection  30 mg Subcutaneous Q24H  . furosemide  40 mg Oral Daily  . insulin aspart  0-20 Units Subcutaneous TID WC  . insulin aspart  0-5 Units Subcutaneous QHS  . metoprolol succinate  25 mg Oral Daily  . moving right along book   Does not apply Once  . pantoprazole  40 mg Oral Q1200  . potassium chloride  20 mEq Oral Daily  . sodium chloride  3 mL Intravenous Q12H  . DISCONTD: metoprolol tartrate  12.5 mg Per Tube BID  . DISCONTD: metoprolol tartrate  12.5 mg Oral BID  . DISCONTD: sodium chloride  3 mL Intravenous Q12H      . DISCONTD: sodium chloride Stopped (04/08/11 1100)  . DISCONTD: sodium chloride    . DISCONTD: sodium chloride 20 mL/hr at 04/12/11 2107  . DISCONTD: sodium chloride Stopped (04/08/11 2100)  . DISCONTD: DOPamine Stopped (04/13/11 0000)  . DISCONTD: lactated ringers Stopped (04/11/11 2026)    Physical Exam: The patient appears to be in no distress. Lungs clear. Heart no gallop or rub Abdomen soft. Extremities:  1+ edema    Lab Results:  Basename 04/14/11 0500 04/13/11 0355  WBC 10.3 8.9  HGB 8.6* 8.1*  PLT 446*  329    Basename 04/13/11 0355 04/12/11 0410  NA 130* 131*  K 3.9 4.1  CL 94* 94*  CO2 23 24  GLUCOSE 146* 105*  BUN 40* 40*  CREATININE 2.00* 2.40*   No results found for this basename: TROPONINI:2,CK,MB:2 in the last 72 hours Hepatic Function Panel  Basename 04/12/11 0410  PROT 5.7*  ALBUMIN 2.5*  AST 21  ALT 11  ALKPHOS 132*  BILITOT 0.5  BILIDIR --  IBILI --   No results found for this basename: CHOL in the last 72 hours No results found for this basename: PROTIME in the last 72 hours  Imaging: Imaging results have been reviewed  Cardiac Studies:  Assessment/Plan:  Patient Active Hospital Problem List: S/P CABG    Overall doing well.  Less dyspnea.  Rhythm stable.   LOS: 8 days    Cassell Clement 04/14/2011, 7:46 AM

## 2011-04-14 NOTE — Progress Notes (Signed)
   CARE MANAGEMENT NOTE 04/14/2011  Patient:  Kurt Richard, Kurt Richard   Account Number:  [id number]  Date Initiated:  04/07/2011  Documentation initiated by:  [facility]  Subjective/Objective Assessment:   Post op emergent CABG - balloon pump preop.  Has Spouse     Action/Plan:   PTA, PT INDEPENDENT, LIVES WITH PARTNER   Anticipated DC Date:  04/16/2011   Anticipated DC Plan:  HOME W HOME HEALTH SERVICES      DC Planning Services  CM consult      [facility] Choice  HOME HEALTH   Choice offered to / List presented to:  C-1 Patient   DME arranged  [place] - ROLL[facility]  TUB BENCH      DME agency  [facility]     HH arranged  HH-1 RN  HH-2 PT  HH-3 OT      [facility] agency  [facility]   Status of service:  In process, will continue to follow Medicare Important Message given?   (If response is "NO", the following Medicare IM given date fields will be blank) Date Medicare IM given:   Date Additional Medicare IM given:    Discharge Disposition:  HOME W HOME HEALTH SERVICES  Per UR Regulation:  Reviewed for med. necessity/level of care/duration of stay  If discussed at Long Length of Stay Meetings, dates discussed:    Comments:  04/14/10 Ramy Greth,RN,BSN 1115 PT S/P NSTEMI AND CABG X 5 ON 04/06/11.  ?NEUROLOGICAL EVENT WHILE IN ICU, WITH ALTERED MENTAL STATUS AND VISION DEFICITS, NOW IMPROVING.  PT AT TOO HIGH LEVEL FOR CIR, PLANS TO DISCHARGE HOME WITH PARTNER, WHO WILL TAKE AT LEAVE FROM WORK TO PROVIDE 24HR CARE.  PT WILL NEED CONTINUED PT/OT AT HOME, WILL ARRANGE HHRN FOR RESTORATIVE CARE.  PT REQUESTS RW AND TUB SEAT FOR HOME.  WILL ARRANGE HOME SERVICES PRIOR TO DISCHARGE.  REFERRAL TO [facility], PER CHOICE.  START OF CARE 24-48H POST DC DATE. Phone #[phone]

## 2011-04-14 NOTE — Progress Notes (Signed)
Occupational Therapy Treatment Patient Details Name: Kurt Richard MRN: [id number] DOB: [birth year] Today's Date: 04/14/2011  OT Assessment/Plan OT Assessment/Plan OT Plan: Discharge plan needs to be updated Follow Up Recommendations: Home health OT;Supervision/Assistance - 24 hour Equipment Recommended: Tub/shower seat OT Goals ADL Goals ADL Goal: Grooming - Progress: Progressing toward goals ADL Goal: Upper Body Bathing - Progress: Progressing toward goals ADL Goal: Lower Body Bathing - Progress: Progressing toward goals ADL Goal: Lower Body Dressing - Progress: Progressing toward goals ADL Goal: Toilet Transfer - Progress: Progressing toward goals ADL Goal: Additional Goal #1 - Progress: Progressing toward goals  OT Treatment Precautions/Restrictions  Precautions Precautions: Sternal;Fall Precaution Comments: pt able to recall 3/3 sternal precautions but required Min Assist/VC to generalize Restrictions Weight Bearing Restrictions: No   ADL ADL Grooming: Performed;Wash/dry hands;Wash/dry face;Supervision/safety Where Assessed - Grooming: Standing at sink Upper Body Bathing: Simulated;Supervision/safety Where Assessed - Upper Body Bathing: Standing at sink Lower Body Bathing: Performed;Minimal assistance Where Assessed - Lower Body Bathing: Standing at sink Lower Body Dressing: Performed;Supervision/safety Lower Body Dressing Details (indicate cue type and reason): don/doff bilateral socks. Min a for simulated pulling up of pants as pt balance somewhat off with lean to the right Where Assessed - Lower Body Dressing: Sit to stand from bed Toilet Transfer: Performed;Minimal assistance Toilet Transfer Details (indicate cue type and reason): Min A for maneuvering around objects in room as pt with dec lt field vision. educated pt to scan environment prior to ambulating and while if necessary Toilet Transfer Method: Proofreader: Regular height toilet;Grab  bars Toileting - Clothing Manipulation: Minimal assistance;Simulated Where Assessed - Glass blower/designer Manipulation: Standing Toileting - Hygiene: Not assessed Tub/Shower Transfer: Performed;Supervision/safety;Set up Tub/Shower Transfer Details (indicate cue type and reason): VC for hand placement and sequencing Tub/Shower Transfer Method: Ambulating Equipment Used: Rolling [name] Ambulation Related to ADLs: see toilet transfer  End of Session OT - End of Session Equipment Utilized During Treatment: Gait belt Activity Tolerance: Patient tolerated treatment well Patient left: in bed;with call bell in reach;with family/visitor present General Behavior During Session: [facility] for tasks performed Cognition: [facility] for tasks performed Cognitive Impairment: Spoke to pt regarding management of finances- pt states he has had "no trouble" handling them in the past 41mo or so, but did report that his "roommate" is available to assist him as he needs. Roommate confirmed this. Pt slow to process commands from therapist   Kurt Richard  04/14/2011, 12:54 PM

## 2011-04-14 NOTE — Progress Notes (Addendum)
[address]            [place] [address]          [phone]     8 Days Post-Op  Procedure(s) (LRB): CORONARY ARTERY BYPASS GRAFTING (CABG) (N/A) Subjective: Feeling better  Objective  Telemetry NSR  Temp:  [97.6 F (36.4 C)-98.1 F (36.7 C)] 97.6 F (36.4 C) (03/13 0548) Pulse Rate:  [92-116] 93  (03/13 0548) Resp:  [13-26] 18  (03/13 0548) BP: (106-128)/(50-73) 117/60 mmHg (03/13 0548) SpO2:  [97 %-99 %] 98 % (03/13 0548) Weight:  [164 lb 14.5 oz (74.8 kg)] 164 lb 14.5 oz (74.8 kg) (03/13 0540)   Intake/Output Summary (Last 24 hours) at 04/14/11 0839 Last data filed at 04/14/11 0740  Gross per 24 hour  Intake    663 ml  Output    211 ml  Net    452 ml       General appearance: alert, cooperative and no distress Heart: regular rate and rhythm and S1, S2 normal Lungs: mildly diminished in bases Abdomen: soft, non-tender; bowel sounds normal; no masses,  no organomegaly Extremities: trace edema Wound: incisions healing well  Lab Results:  Basename 04/14/11 0500 04/13/11 0355  NA 132* 130*  K 4.4 3.9  CL 94* 94*  CO2 24 23  GLUCOSE 170* 146*  BUN 40* 40*  CREATININE 1.86* 2.00*  CALCIUM 8.5 7.6*  MG -- --  PHOS -- --    Basename 04/12/11 0410  AST 21  ALT 11  ALKPHOS 132*  BILITOT 0.5  PROT 5.7*  ALBUMIN 2.5*   No results found for this basename: LIPASE:2,AMYLASE:2 in the last 72 hours  Basename 04/14/11 0500 04/13/11 0355  WBC 10.3 8.9  NEUTROABS -- --  HGB 8.6* 8.1*  HCT 25.2* 23.2*  MCV 89.0 88.2  PLT 446* 329   No results found for this basename: CKTOTAL:4,CKMB:4,TROPONINI:4 in the last 72 hours No components found with this basename: POCBNP:3 No results found for this basename: DDIMER in the last 72 hours No results found for this basename: HGBA1C in the last 72 hours No results found for this basename: CHOL,HDL,LDLCALC,TRIG,CHOLHDL in the last 72 hours No results found for this basename:  TSH,T4TOTAL,FREET3,T3FREE,THYROIDAB in the last 72 hours No results found for this basename: VITAMINB12,FOLATE,FERRITIN,TIBC,IRON,RETICCTPCT in the last 72 hours  Medications: Scheduled    . aspirin EC  325 mg Oral Daily   Or  . aspirin  324 mg Per Tube Daily  . atorvastatin  80 mg Oral q1800  . bisacodyl  10 mg Oral Daily   Or  . bisacodyl  10 mg Rectal Daily  . docusate sodium  200 mg Oral Daily  . enoxaparin (LOVENOX) injection  30 mg Subcutaneous Q24H  . furosemide  40 mg Oral Daily  . insulin aspart  0-20 Units Subcutaneous TID WC  . insulin aspart  0-5 Units Subcutaneous QHS  . metoprolol succinate  25 mg Oral Daily  . moving right along book   Does not apply Once  . pantoprazole  40 mg Oral Q1200  . potassium chloride  20 mEq Oral Daily  . sodium chloride  3 mL Intravenous Q12H  . DISCONTD: sodium chloride  3 mL Intravenous Q12H     Radiology/Studies:  Dg Chest 2 View  04/14/2011  *RADIOLOGY REPORT*  Clinical Data: Status post CABG, weakness  CHEST - 2 VIEW  Comparison: 04/12/2011  Findings: Cardiomediastinal silhouette is stable.  Status post CABG.  Slight improvement in aeration.  Persistent small left pleural effusion with left basilar atelectasis or infiltrate.  No pulmonary edema.  Metallic fixation plate cervical spine again noted.  IMPRESSION: Status post CABG.  Slight improvement in aeration.  No pulmonary edema.  Persistent small left pleural effusion left basilar atelectasis or infiltrate.  Original Report Authenticated By: Natasha Mead, M.D.   Ct Head Wo Contrast  04/13/2011  *RADIOLOGY REPORT*  Clinical Data: Altered mental status, recent CABG.  CT HEAD WITHOUT CONTRAST  Technique:  Contiguous axial images were obtained from the base of the skull through the vertex without contrast.  Comparison: 04/08/2011  Findings: Hypoattenuation within the right occipital lobe, in keeping with subacute to remote infarction.  The gray-white distinction at the same level on the left  side is indistinct however likely because it is obscured by streak artifact.  There is no evidence for acute hemorrhage, hydrocephalus, mass lesion, or abnormal extra-axial fluid collection.  No definite CT evidence for acute infarction.  The visualized paranasal sinuses and mastoid air cells are predominately clear.  IMPRESSION: Right occipital lobe hypoattenuation is similar to prior.  No definite acute intracranial abnormality.  Original Report Authenticated By: Waneta Martins, M.D.    INR: Will add last result for INR, ABG once components are confirmed Will add last 4 CBG results once components are confirmed  Assessment/Plan: S/P Procedure(s) (LRB): CORONARY ARTERY BYPASS GRAFTING (CABG) (N/A)  1. Doing well 2. Creat conts to improve 3. H/H improved 4.  pulm toilet/rehab- continue 5. Stroke team following-PT/OT  LOS: 8 days    Kurt Richard,Kurt Richard 3/13/20138:39 AM  Repeat echo showed EF 30-35% (definitely improved from cath lab/OR) Feels well today. Apparently functioning at too high a level for inpatient rehab. Renal function continues to improve CBGs still on the high side, wasn't on meds at home Hopefully ready for d/c home this weekend

## 2011-04-14 NOTE — Progress Notes (Signed)
Patient refused walk at this time.  Says he will walk before bedtime.  Will continue to monitor.

## 2011-04-15 LAB — GLUCOSE, CAPILLARY
Glucose-Capillary: 161 mg/dL — ABNORMAL HIGH (ref 70–99)
Glucose-Capillary: 181 mg/dL — ABNORMAL HIGH (ref 70–99)
Glucose-Capillary: 163 mg/dL — ABNORMAL HIGH (ref 70–99)

## 2011-04-15 MED ORDER — LINAGLIPTIN 5 MG PO TABS
5.0000 mg | ORAL_TABLET | Freq: Every day | ORAL | Status: DC
  Administered 2011-04-15 – 2011-04-16 (×2): 5 mg via ORAL
  Filled 2011-04-15 (×2): qty 1

## 2011-04-15 NOTE — Progress Notes (Signed)
Speech Pathology:  Treatment Note  Subjective: Patient alert and pleasant  Objective:  Patient seen for f/u cognitive-linguistic treatment. Based on initial evaluation, patient appears much improved today. Patient alert and able to sustain attention to conversation for 15-20 minutes. Patient able to demonstrate intact functional problem solving for situations pertaining to functional ADLs independently, able to verbalize diagnosis and deficits independently, and demonstrate intact recall of verbally presented moderately complex information after a 5 minute delay. Patient judged to no longer require acute SLP f/u however given intact level of independence prior to admission, recommend [facility] SLP evaluation of high level cognition, utilizing home environment after d/c.   Assessment:  Patient has met 4/4 short term goals today. Overall great improvement in cognitive-linguistic function.   Recommendations:  1. No further acute SLP needs indicated at this time. 2. HH SLP to f/u after d/c for evaluation of high level cognition in home environment.   Pain:   none Intervention Required:   No   Goals: Met  Ferdinand Lango MA, CCC-SLP [phone]

## 2011-04-15 NOTE — Progress Notes (Signed)
CARDIAC REHAB PHASE I   PRE:  Rate/Rhythm: 98SR  BP:  Supine: 107/54  Sitting:   Standing:    SaO2: 99%1L  MODE:  Ambulation: 550 ft   POST:  Rate/Rhythem: 116ST  BP:  Supine:   Sitting: 112/49  Standing:    SaO2: 100%RA 0855-0920 Pt walked 550 ft on RA with rolling [place] and asst x 1. Tolerated well except c/o leg cramps. Had to stop numerous times to rest legs. Tired by end of walk. To recliner with call bell.   Kurt Richard

## 2011-04-15 NOTE — Progress Notes (Signed)
Pt. Refused third ambulation, pt stated they felt tired and waned to rest. Pt encouraged to walk in the am. Will continue to monitor.

## 2011-04-15 NOTE — Progress Notes (Signed)
Inpatient Diabetes Program Recommendations  AACE/ADA: New Consensus Statement on Inpatient Glycemic Control (2009)  Target Ranges:  Prepandial:   less than 140 mg/dL      Peak postprandial:   less than 180 mg/dL (1-2 hours)      Critically ill patients:  140 - 180 mg/dL   Results for HANSON, MEDEIROS (MRN [id number]) as of 04/15/2011 10:57  Ref. Range 04/14/2011 06:10 04/14/2011 11:10 04/14/2011 16:33 04/14/2011 20:16  Glucose-Capillary Latest Range: 70-99 mg/dL 409 (H) 811 (H) 914 (H) 95    Inpatient Diabetes Program Recommendations Correction (SSI): Currently on Novolog Resistant SSI tid ac + HS. 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  Note: will follow. Ambrose Finland RN, MSN, CDE Diabetes Coordinator Inpatient Diabetes Program [phone]

## 2011-04-15 NOTE — Progress Notes (Signed)
PT Cancellation Note  Treatment cancelled today due to pt declined PT this pm secondary to very fatigued and not wanting to get up.  Will try another time.    Kurt Richard, [place] [phone] 04/15/2011, 2:16 PM

## 2011-04-15 NOTE — Progress Notes (Signed)
UR Completed.  Kurt Richard Jane [phone] 04/15/2011  

## 2011-04-15 NOTE — Consult Note (Signed)
Pt states that he quit smoking 2 months ago despite his social hx which shows he's a current 1/2 ppd smoker. Congratulated and encouraged pt to stay quit. He says he quit cold [place] and was smoking 1 ppd at the time when he quit. Stressed the benefits of his quitting and how better his health will be and the changes he will see. Referred to 1-800 quit now for f/u and support. Discussed oral fixation substitutes, second hand smoke and in home smoking policy. Reviewed and gave pt Written education/contact information.

## 2011-04-15 NOTE — Progress Notes (Addendum)
                    [address]            [place] [address]          [phone]     9 Days Post-Op Procedure(s) (LRB): CORONARY ARTERY BYPASS GRAFTING (CABG) (N/A)  Subjective: Feels well, no complaints.  Objective: Vital signs in last 24 hours: Patient Vitals for the past 24 hrs:  BP Temp Temp src Pulse Resp SpO2 Weight  04/15/11 0454 116/64 mmHg 98.3 F (36.8 C) Oral 95  17  98 % -  04/15/11 0424 - - - - - - 73.8 kg (162 lb 11.2 oz)  04/14/11 2014 106/60 mmHg 97.8 F (36.6 C) Oral 95  17  99 % -  04/14/11 1347 110/60 mmHg 97.9 F (36.6 C) Oral 93  18  99 % -   Current Weight  04/15/11 73.8 kg (162 lb 11.2 oz)  PRE-OPERATIVE WEIGHT: 73kg    Intake/Output from previous day: 03/13 0701 - 03/14 0700 In: 480 [P.O.:480] Out: 250 [Urine:250]  CBGs 200-95-141  PHYSICAL EXAM:  Heart: RRR Lungs: clear Wound: clean and dry Extremities: no significant LE edema  Lab Results: CBC: Basename 04/14/11 0500 04/13/11 0355  WBC 10.3 8.9  HGB 8.6* 8.1*  HCT 25.2* 23.2*  PLT 446* 329   BMET:  Basename 04/14/11 0500 04/13/11 0355  NA 132* 130*  K 4.4 3.9  CL 94* 94*  CO2 24 23  GLUCOSE 170* 146*  BUN 40* 40*  CREATININE 1.86* 2.00*  CALCIUM 8.5 7.6*    PT/INR: No results found for this basename: LABPROT,INR in the last 72 hours   Assessment/Plan: S/P Procedure(s) (LRB): CORONARY ARTERY BYPASS GRAFTING (CABG) (N/A) CV- stable, continue current meds. ARI- Cr trending down. Continue to monitor. ABL anemia- stable. Hyperglycemia- CBGs elevated, A1C=8.2. Amaryl and Glipizide Contraindicated secondary to cardizem allergy(hives), Cr too high for metformin.  Will discuss with MD since he will likely need a po agent for home. Vol overload- diurese. CVA- continue PT/OT. Continue current care.  Hopefully ready for d/c soon.     LOS: 9 days    COLLINS,GINA H 04/15/2011  Patient seen and examined. Agree with above. Will try tradjenta based on  rec from diabetes coordinator

## 2011-04-15 NOTE — Progress Notes (Signed)
Patient ambulated aproxiamately 200 feet x 1 assist with [place]. Pt tolerated well, but tired after walk.  Pt back in room call bell with in reach will monitor patient. Samaj Wessells, Randall An RN

## 2011-04-16 DIAGNOSIS — I2109 ST elevation (STEMI) myocardial infarction involving other coronary artery of anterior wall: Secondary | ICD-10-CM

## 2011-04-16 LAB — CBC
HCT: 26 % — ABNORMAL LOW (ref 39.0–52.0)
RBC: 2.84 MIL/uL — ABNORMAL LOW (ref 4.22–5.81)
RDW: 15.1 % (ref 11.5–15.5)
WBC: 8.8 10*3/uL (ref 4.0–10.5)

## 2011-04-16 LAB — GLUCOSE, CAPILLARY: Glucose-Capillary: 147 mg/dL — ABNORMAL HIGH (ref 70–99)

## 2011-04-16 LAB — BASIC METABOLIC PANEL
Chloride: 99 mEq/L (ref 96–112)
Creatinine, Ser: 1.9 mg/dL — ABNORMAL HIGH (ref 0.50–1.35)
GFR calc Af Amer: 41 mL/min — ABNORMAL LOW (ref >90)
Potassium: 4.8 mEq/L (ref 3.5–5.1)
Sodium: 135 mEq/L (ref 135–145)

## 2011-04-16 MED ORDER — FUROSEMIDE 40 MG PO TABS: 40.0000 mg | ORAL_TABLET | Freq: Every day | ORAL | Status: DC

## 2011-04-16 MED ORDER — METOPROLOL SUCCINATE ER 25 MG PO TB24: 25.0000 mg | ORAL_TABLET | Freq: Every day | ORAL | Status: DC

## 2011-04-16 MED ORDER — ATORVASTATIN CALCIUM 80 MG PO TABS: 80.0000 mg | ORAL_TABLET | Freq: Every day | ORAL | Status: DC

## 2011-04-16 MED ORDER — ASPIRIN 325 MG PO TBEC: 325.0000 mg | DELAYED_RELEASE_TABLET | Freq: Every day | ORAL | Status: DC

## 2011-04-16 MED ORDER — OXYCODONE HCL 5 MG PO TABS: 5.0000 mg | ORAL_TABLET | ORAL | Status: DC | PRN

## 2011-04-16 MED ORDER — POTASSIUM CHLORIDE CRYS ER 20 MEQ PO TBCR: 20.0000 meq | EXTENDED_RELEASE_TABLET | Freq: Every day | ORAL | Status: DC

## 2011-04-16 MED ORDER — LINAGLIPTIN 5 MG PO TABS: 5.0000 mg | ORAL_TABLET | Freq: Every day | ORAL | Status: DC

## 2011-04-16 NOTE — Progress Notes (Signed)
   CARE MANAGEMENT NOTE 04/16/2011  Patient:  Kurt Richard, Kurt Richard   Account Number:  [id number]  Date Initiated:  04/07/2011  Documentation initiated by:  [facility]  Subjective/Objective Assessment:   Post op emergent CABG - balloon pump preop.  Has Spouse     Action/Plan:   PTA, PT INDEPENDENT, LIVES WITH PARTNER   Anticipated DC Date:  04/16/2011   Anticipated DC Plan:  HOME W HOME HEALTH SERVICES      DC Planning Services  CM consult      [facility] Choice  HOME HEALTH   Choice offered to / List presented to:  C-1 Patient   DME arranged  [facility]      DME agency  [facility]     HH arranged  HH-1 RN  HH-2 PT  HH-3 OT      [facility] agency  [facility]   Status of service:  Completed, signed off Medicare Important Message given?   (If response is "NO", the following Medicare IM given date fields will be blank) Date Medicare IM given:   Date Additional Medicare IM given:    Discharge Disposition:  HOME W HOME HEALTH SERVICES  Per UR Regulation:  Reviewed for med. necessity/level of care/duration of stay  If discussed at Long Length of Stay Meetings, dates discussed:    Comments:  04/14/11 Tammela Bales,RN,BSN PT FOR DISCHARGE HOME TODAY WITH PARTNER AND HH AS ARRANGED.  NOTIFIED [facility] OF DC HOME TODAY. Phone #[phone]   04/14/10 Bronco Mcgrory,RN,BSN 1115 PT S/P NSTEMI AND CABG X 5 ON 04/06/11.  ?NEUROLOGICAL EVENT WHILE IN ICU, WITH ALTERED MENTAL STATUS AND VISION DEFICITS, NOW IMPROVING.  PT AT TOO HIGH LEVEL FOR CIR, PLANS TO DISCHARGE HOME WITH PARTNER, WHO WILL TAKE AT LEAVE FROM WORK TO PROVIDE 24HR CARE.  PT WILL NEED CONTINUED PT/OT AT HOME, WILL ARRANGE HHRN FOR RESTORATIVE CARE.  PT REQUESTS RW AND TUB SEAT FOR HOME.  WILL ARRANGE HOME SERVICES PRIOR TO DISCHARGE.  REFERRAL TO [facility], PER CHOICE.  START OF CARE 24-48H POST DC DATE. Phone #[phone]

## 2011-04-16 NOTE — Discharge Instructions (Addendum)
Coronary Artery Bypass Grafting Care After Refer to this sheet in the next few weeks. These instructions provide you with information on caring for yourself after your procedure. Your caregiver may also give you more specific instructions. Your treatment has been planned according to current medical practices, but problems sometimes occur. Call your caregiver if you have any problems or questions after your procedure.  Recovery from open heart surgery will be different for everyone. Some people feel well after 3 or 4 weeks, while for others it takes longer. After heart surgery, it may be normal to:  Not have an appetite, feel nauseated by the smell of food, or only want to eat a small amount.   Be constipated because of changes in your diet, activity, and medicines. Eat foods high in fiber. Add fresh fruits and vegetables to your diet. Stool softeners may be helpful.   Feel sad or unhappy. You may be frustrated or cranky. You may have good days and bad days. Do not give up. Talk to your caregiver if you do not feel better.   Feel weakness and fatigue. You many need physical therapy or cardiac rehabilitation to get your strength back.   Develop an irregular heartbeat called atrial fibrillation. Symptoms of atrial fibrillation are a fast, irregular heartbeat or feelings of fluttery heartbeats, shortness of breath, low blood pressure, and dizziness. If these symptoms develop, see your caregiver right away.  MEDICATION  Have a list of all the medicines you will be taking when you leave the hospital. For every medicine, know the following:   Name.   Exact dose.   Time of day to be taken.   How often it should be taken.   Why you are taking it.   Ask which medicines should or should not be taken together. If you take more than one heart medicine, ask if it is okay to take them together. Some heart medicines should not be taken at the same time because they may lower your blood pressure too  much.   Narcotic pain medicine can cause constipation. Eat fresh fruits and vegetables. Add fiber to your diet. Stool softener medicine may help relieve constipation.   Keep a copy of your medicines with you at all times.   Do not add or stop taking any medicine until you check with your caregiver.   Medicines can have side effects. Call your caregiver who prescribed the medicine if you:   Start throwing up, have diarrhea, or have stomach pain.   Feel dizzy or lightheaded when you stand up.   Feel your heart is skipping beats or is beating too fast or too slow.   Develop a rash.   Notice unusual bruising or bleeding.  HOME CARE INSTRUCTIONS  After heart surgery, it is important to learn how to take your pulse. Have your caregiver show you how to take your pulse.   Use your incentive spirometer. Ask your caregiver how long after surgery you need to use it.  Care of your chest incision  Tell your caregiver right away if you notice clicking in your chest (sternum).   Support your chest with a pillow or your arms when you take deep breaths and cough.   Follow your caregiver's instructions about when you can bathe or swim.   Protect your incision from sunlight during the first year to keep the scar from getting dark.   Tell your caregiver if you notice:   Increased tenderness of your incision.   Increased redness   or swelling around your incision.   Drainage or pus from your incision.  Care of your leg incision(s)  Avoid crossing your legs.   Avoid sitting for long periods of time. Change positions every half hour.   Elevate your leg(s) when you are sitting.   Check your leg(s) daily for swelling. Check the incisions for redness or drainage.   Wear your elastic stockings as told by your caregiver. Take them off at bedtime.  Diet  Diet is very important to heart health.   Eat plenty of fresh fruits and vegetables. Meats should be lean cut. Avoid canned, processed, and  fried foods.   Talk to a dietician. They can teach you how to make healthy food and drink choices.  Weight  Weigh yourself every day. This is important because it helps to know if you are retaining fluid that may make your heart and lungs work harder.   Use the same scale each time.   Weigh yourself every morning at the same time. You should do this after you go to the bathroom, but before you eat breakfast.   Your weight will be more accurate if you do not wear any clothes.   Record your weight.   Tell your caregiver if you have gained 2 pounds or more overnight.  Activity Stop any activity at once if you have chest pain, shortness of breath, irregular heartbeats, or dizziness. Get help right away if you have any of these symptoms.  Bathing.  Avoid soaking in a bath or hot tub until your incisions are healed.   Rest. You need a balance of rest and activity.   Exercise. Exercise per your caregiver's advice. You may need physical therapy or cardiac rehabilitation to help strengthen your muscles and build your endurance.   Climbing stairs. Unless your caregiver tells you not to climb stairs, go up stairs slowly and rest if you tire. Do not pull yourself up by the handrail.   Driving a car. Follow your caregiver's advice on when you may drive. You may ride as a passenger at any time. When traveling for long periods of time in a car, get out of the car and walk around for a few minutes every 2 hours.   Lifting. Avoid lifting, pushing, or pulling anything heavier than 10 pounds for 6 weeks after surgery or as told by your caregiver.   Returning to work. Check with your caregiver. People heal at different rates. Most people will be able to go back to work 6 to 12 weeks after surgery.   Sexual activity. You may resume sexual relations as told by your caregiver.  SEEK MEDICAL CARE IF:  Any of your incisions are red, painful, or have any type of drainage coming from them.   You have an  oral temperature above 102 F (38.9 C).   You have ankle or leg swelling.   You have pain in your legs.   You have weight gain of 2 or more pounds a day.   You feel dizzy or lightheaded when you stand up.  SEEK IMMEDIATE MEDICAL CARE IF:  You have angina or chest pain that goes to your jaw or arms. Call your local emergency services right away.   You have shortness of breath at rest or with activity.   You have a fast or irregular heartbeat (arrhythmia).   There is a "clicking" in your sternum when you move.   You have numbness or weakness in your arms or legs.    MAKE SURE YOU:  Understand these instructions.   Will watch your condition.   Will get help right away if you are not doing well or get worse.  Document Released: 08/07/2004 Document Revised: 01/07/2011 Document Reviewed: 03/25/2010 [facility] Patient Information 2012 [place], [place].   Home health follow up will be provided by [facility]  (RN, PT, and OT) Phone:  [phone]  (they will call you)

## 2011-04-16 NOTE — Progress Notes (Signed)
Cardiac Rehab [phone] Education completed with pt. Permission given to refer to [facility] Phase 2. Encouraged smoking cessation. Put on [facility] video for pt to view. Gerardo Territo DunlapRN

## 2011-04-16 NOTE — Discharge Summary (Signed)
[address]            [place] [address]          [phone]      KEGAN MCKEITHAN May 18, 1945 66 y.o. [id number]  04/06/2011   Loreli Slot, MD  HPI: 66 y.o. male w/ PMHx significant for tobacco abuse, HTN, HLD, and DMII who presented to [facility] on 04/06/2011 with complaints of shortness of breath and chest pain.  He reports having progressive sob over the last couple of days that worsened two days ago and also had some mild chest tightness. He also reports doe,orthopnea, and nonproductive cough. He has never had this chest discomfort or dyspnea in the past. He is not active, smokes ~ 1/2 ppd, and does not take any medications on a daily basis.  In the ED, EKG revealed sinus tachycardia 124bpm w/ ST elevation V2 & V3, T wave inversions lateral leads, Q waves V1, V2 and poor R wave progression. CXR revealed acute pulmonary edema vs viral/atypical PNA and a vague 14mm RUL nodule. Cardiac enzymes are pending. He was chest pain free. A code STEMI was called and he was taken to the cath lab for emergent cardiac catheterization.   Family History   Problem  Relation  Age of Onset   .  Heart attack  Mother       ?81s    .  Heart attack  Sister       13s    Social History: reports that he has been smoking Cigarettes. He has been smoking about .5 packs per day. He does not have any smokeless tobacco history on file. He reports that he does not drink alcohol or use illicit drugs.  Allergies:  Allergies   Allergen  Reactions   .  Cardizem  Hives   .  Nifedipine  Swelling    Medications:  I have reviewed the patient's current medications.  Prior to Admission:  Prescriptions prior to admission   Medication  Sig  Dispense  Refill   .  aspirin EC 81 MG tablet  Take 81 mg by mouth daily.     .  nabumetone (RELAFEN) 500 MG tablet  Take 500 mg by mouth daily as needed. For muscle pain.       Hospital Course:  The patient was taken promptly  to the cardiac catheterization lab. Findings were as noted:  Procedure: Left Heart Cath, Selective Coronary Angiography, LV angiography, intra-aortic balloon pump placement.  Indication: Anterior ST elevation myocardial infarction with late presentation and early cardiogenic shock.  Medications:  Sedation: 1 mg IV Versed, 25 mcg IV Fentanyl  Contrast: 50 mL Omnipaque  Procedural details: The right groin was prepped, draped, and anesthetized with 1% lidocaine. Using modified Seldinger technique, a 6 French sheath was introduced into the right femoral artery. Standard Judkins catheters were used for coronary angiography and left ventriculography. Left main angiography was performed with an XB 3.5 guiding catheter Catheter exchanges were performed over a guidewire. There were no immediate procedural complications. At the end of the case, I decided to proceed with an intra-aortic balloon pump placement for hemodynamic support due to left main, three-vessel disease as well as early cardiogenic shock. It's [place] sheath was exchanged and a 7 French balloon pump sheath. The balloon was advanced over a wire and placed in the descending aorta just distal to the origin  of the left subclavian artery. Abdominal aortogram was performed before that with a hand injection.  Procedural Findings:  Hemodynamics:  AO: 92/60 mmHg  LV: 93/23 mmHg  LVEDP: 33 mmHg  Coronary angiography:  Coronary dominance: Right  Left Main: There is a 95% hazy stenosis in the ostial left main suggestive of a thrombus.  Left Anterior Descending (LAD): There is 40% diffuse stenosis in the midsegment after the first septal perforator. The vessel is occluded in the midsegment after giving the second septal branch and a large diagonal branch. There no significant collaterals going to the distal LAD. There is very faint collaterals supplying the apical LAD by the right coronary artery.  1st diagonal (D1): Large branch that gives to medium size  branches which seems to be appropriate for bypass.  Circumflex (LCx): The vessel is medium in size and nondominant. There is an 80% ostial stenosis.  1st obtuse marginal: Very small in size.  2nd obtuse marginal: Small in size.  3rd obtuse marginal: Medium in size with moderate atherosclerosis.  Ramus Intermedius: Normal in size with 90% diffuse disease in the proximal and mid segment. It's free of significant disease distally.  Right Coronary Artery: Normal in size and dominant. There is a 60-70% discrete stenosis proximally. The mid-vessel has a diffuse 40-50% disease. There is a 40% stenosis distally before the bifurcation.  posterior descending artery: Normal in size with mild atherosclerosis but no evidence of obstructive disease.  posterior lateral branch: The first PL is small in size. The second PL is large in size with 60% proximal disease. The third PL is normal in size with mild atherosclerosis. Left ventriculography: Left ventricular systolic function is severely reduced , LVEF is estimated at 15 %, there is mild mitral regurgitation which might be underestimated. There is akinesis of the mid/distal anterior and distal inferior walls Dyskinesis of the apex.  Final Conclusions:  1. Acute anterior myocardial infarction with late presentation.  2. Severe left main and three-vessel coronary artery disease. The LAD is occluded with very little collaterals.  3. Severely reduced LV systolic function.  4. Severely elevated left ventricular end-diastolic pressure and borderline hypotension likely due to early cardiogenic shock.  5. Intra-aortic balloon placement.  Recommendations: Emergent CABG. This was discussed with the patient and Dr. Dorris Fetch who accepted doing emergent CABG. Obviously, this would be a very high-risk surgery but it's likely the best option at this time.  Lorine Bears MD, [facility]  04/06/2011, 10:36 AM   She was referred to Dr. Dorris Fetch and cardiothoracic surgical  consultation. He evaluated the patient and studies and agreed recommendations to proceed with emergent surgical intervention. The following procedure was performed:  OPERATIVE REPORT  PREOPERATIVE DIAGNOSES: Critical left main and three-vessel coronary  artery disease with ST-elevation myocardial infarction.  POSTOPERATIVE DIAGNOSES: Critical left main and three-vessel coronary  artery disease with ST-elevation myocardial infarction.  OPERATION: Emergency median sternotomy, extracorporeal circulation,  coronary artery bypass grafting x5 (left internal mammary artery to left  anterior descending, saphenous vein graft to first diagonal, saphenous  vein graft to first obtuse marginal, sequential saphenous vein graft to  posterior descending and posterior lateral), endoscopic vein harvest  right leg and left thigh.  SURGEON: Salvatore Decent. Dorris Fetch, MD  ASSISTANT: Rowe Clack, PA-C  ANESTHESIA: General.  FINDINGS: Diffusely diseased coronaries, fair quality target vessels,  LAD was graftable, significant anterior wall scar and marked edema in  the anterior wall. Transesophageal echocardiography revealed severe  left ventricular dysfunction with trace to 1+ mitral  regurgitation,  prebypass improved left ventricular wall motion, 1 to 2+ mitral  regurgitation post bypass. Vein from right lower leg not usable. All  vein used good quality.  The patient tolerated procedure well was taken to the surgical intensive care in stable condition.   Postoperative hospital course: Patient has overall progressed well. Initially he did require significant inotropic support. Additionally he did require ongoing intra-aortic balloon pump support. He was able to be weaned without significant difficulty. He was able to be weaned from the ventilator also without significant difficulty. He has required aggressive diuresis for volume overload. He has responded well to this. He did have an associated acute renal  insufficiency which was multifactorial. Function has shown a steady improvement with time. He did have some postoperative neurological changes. Neurology consultation was obtained. Initial diagnosis was felt to be consistent with an acute infarct. Full evaluation was undertaken including CT of the head. MRI was not able to be done however, continued to recent surgery. Primary treatment at this time is aspirin. Physical therapy and occupational therapy were also consult. 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 Blood sugars have been under good control he is felt to require an oral agent and discharge, specifically Tradjenta. Of note his hemoglobin A1c was measured at 8.2. He does have an acute blood loss anemia, but values are stable. Incisions are healing well without evidence of infection. He has been seen by the tobacco cessation counselor. He does have home assistance from a roommate and in addition home care arrangements have been arranged. Overall he is felt to be stable for discharge on today's date.   Basename 04/16/11 0500 04/14/11 0500  NA 135 132*  K 4.8 4.4  CL 99 94*  CO2 26 24  GLUCOSE 158* 170*  BUN 42* 40*  CALCIUM 8.5 8.5    Basename 04/16/11 0500 04/14/11 0500  WBC 8.8 10.3  HGB 8.7* 8.6*  HCT 26.0* 25.2*  PLT 514* 446*   No results found for this basename: INR:2 in the last 72 hours   Discharge Instructions:  The patient is discharged to home with extensive instructions on wound care and progressive ambulation.  They are instructed not to drive or perform any heavy lifting until returning to see the physician in his office.  Discharge Diagnosis:  sob, tightness in chest stemi Postoperative cardioembolic CVA Postoperative acute blood loss anemia Postoperative acute renal insufficiency  Secondary Diagnosis: Patient Active Problem List  Diagnoses  . Tobacco abuse  .  Hyperlipidemia  . Hypertension  . STEMI (ST elevation myocardial infarction)  . Diabetes mellitus   Past Medical History  Diagnosis Date  . Diabetes mellitus   . Hypertension   . CHF (congestive heart failure)   . Hyperlipidemia   . Tobacco abuse        Steve, [place]  Home Medication Instructions [id number]   Printed on:04/16/11 6578  Medication Information                    aspirin EC 325 MG EC tablet Take 1 tablet (325 mg total) by mouth daily.           atorvastatin (LIPITOR) 80 MG tablet Take 1 tablet (80 mg total) by mouth daily at 6 PM.           linagliptin (TRADJENTA) 5 MG TABS tablet Take 1 tablet (5 mg  total) by mouth daily.           metoprolol succinate (TOPROL-XL) 25 MG 24 hr tablet Take 1 tablet (25 mg total) by mouth daily.           oxyCODONE (OXY IR/ROXICODONE) 5 MG immediate release tablet Take 1-2 tablets (5-10 mg total) by mouth every 4 (four) hours as needed.           potassium chloride SA (K-DUR,KLOR-CON) 20 MEQ tablet Take 1 tablet (20 mEq total) by mouth daily.           furosemide (LASIX) 40 MG tablet Take 1 tablet (40 mg total) by mouth daily.             Disposition: For discharge home  Patient's condition is Good  Gershon Crane, PA-C 04/16/2011  8:53 AM

## 2011-04-16 NOTE — Progress Notes (Signed)
[address]            [place] [address]          [phone]     10 Days Post-Op  Procedure(s) (LRB): CORONARY ARTERY BYPASS GRAFTING (CABG) (N/A) Subjective: Feels well, no new complaints.  Objective  Telemetry NSR  Temp:  [98.1 F (36.7 C)-98.4 F (36.9 C)] 98.1 F (36.7 C) (03/15 0531) Pulse Rate:  [95-97] 95  (03/15 0531) Resp:  [16-18] 16  (03/15 0531) BP: (89-113)/(47-63) 102/60 mmHg (03/15 0618) SpO2:  [96 %-97 %] 97 % (03/15 0531) Weight:  [164 lb 6.4 oz (74.571 kg)] 164 lb 6.4 oz (74.571 kg) (03/15 0531)   Intake/Output Summary (Last 24 hours) at 04/16/11 0840 Last data filed at 04/15/11 1700  Gross per 24 hour  Intake   1083 ml  Output      0 ml  Net   1083 ml       General appearance: alert, cooperative and no distress Heart: regular rate and rhythm and S1, S2 normal Lungs: clear to auscultation bilaterally Abdomen: soft, non-tender; bowel sounds normal; no masses,  no organomegaly Extremities: no edema, redness or tenderness in the calves or thighs Wound: Incisions healing well without evidence of infection  Lab Results:  Basename 04/16/11 0500 04/14/11 0500  NA 135 132*  K 4.8 4.4  CL 99 94*  CO2 26 24  GLUCOSE 158* 170*  BUN 42* 40*  CREATININE 1.90* 1.86*  CALCIUM 8.5 8.5  MG -- --  PHOS -- --   No results found for this basename: AST:2,ALT:2,ALKPHOS:2,BILITOT:2,PROT:2,ALBUMIN:2 in the last 72 hours No results found for this basename: LIPASE:2,AMYLASE:2 in the last 72 hours  Basename 04/16/11 0500 04/14/11 0500  WBC 8.8 10.3  NEUTROABS -- --  HGB 8.7* 8.6*  HCT 26.0* 25.2*  MCV 91.5 89.0  PLT 514* 446*   No results found for this basename: CKTOTAL:4,CKMB:4,TROPONINI:4 in the last 72 hours No components found with this basename: POCBNP:3 No results found for this basename: DDIMER in the last 72 hours No results found for this basename: HGBA1C in the last 72 hours No results found for this  basename: CHOL,HDL,LDLCALC,TRIG,CHOLHDL in the last 72 hours No results found for this basename: TSH,T4TOTAL,FREET3,T3FREE,THYROIDAB in the last 72 hours No results found for this basename: VITAMINB12,FOLATE,FERRITIN,TIBC,IRON,RETICCTPCT in the last 72 hours  Medications: Scheduled    . aspirin EC  325 mg Oral Daily   Or  . aspirin  324 mg Per Tube Daily  . atorvastatin  80 mg Oral q1800  . bisacodyl  10 mg Oral Daily   Or  . bisacodyl  10 mg Rectal Daily  . docusate sodium  200 mg Oral Daily  . enoxaparin (LOVENOX) injection  30 mg Subcutaneous Q24H  . furosemide  40 mg Oral Daily  . insulin aspart  0-20 Units Subcutaneous TID WC  . insulin aspart  0-5 Units Subcutaneous QHS  . linagliptin  5 mg Oral Daily  . metoprolol succinate  25 mg Oral Daily  . pantoprazole  40 mg Oral Q1200  . potassium chloride  20 mEq Oral Daily  . sodium chloride  3 mL Intravenous Q12H     Radiology/Studies:  No results found.  INR: Will add last result for INR, ABG once components are confirmed Will add last 4 CBG results once components are confirmed  Assessment/Plan: S/P Procedure(s) (LRB): CORONARY  ARTERY BYPASS GRAFTING (CABG) (N/A) 1. Continues to feel quite well without new issues. 2 sugars under good control with range from 147-181. 3. Labs stable 4. DC pacer wires. 5. Appears stable for discharge with home care arrangements made.   LOS: 10 days    Aizlyn Schifano E 3/15/20138:40 AM

## 2011-04-16 NOTE — Progress Notes (Signed)
Patient Name: Kurt Richard Date of Encounter: 04/16/2011     Principal Problem:  *STEMI (ST elevation myocardial infarction) Active Problems:  Tobacco abuse  Hyperlipidemia  Hypertension  Diabetes mellitus    SUBJECTIVE: In good spirits. Ambulating well. He reports residual, but improving chest soreness at sternotomy site and mild foot swelling.   OBJECTIVE  Filed Vitals:   04/15/11 1341 04/15/11 2145 04/16/11 0531 04/16/11 0618  BP: 91/47 99/63 89/59  102/60  Pulse: 97 97 95   Temp: 98.4 F (36.9 C) 98.4 F (36.9 C) 98.1 F (36.7 C)   TempSrc: Oral Oral Oral   Resp: 18 18 16    Height:      Weight:   74.571 kg (164 lb 6.4 oz)   SpO2: 97% 96% 97%     Intake/Output Summary (Last 24 hours) at 04/16/11 0846 Last data filed at 04/15/11 1700  Gross per 24 hour  Intake   1083 ml  Output      0 ml  Net   1083 ml   Weight change: 0.771 kg (1 lb 11.2 oz)  PHYSICAL EXAM  General: Well developed, well nourished, in no acute distress. Head: Normocephalic, atraumatic, sclera non-icteric, no xanthomas  Neck: Supple without bruits or JVD. Lungs: Resp regular and unlabored, CTAB without wheezes, rales or rhonchi Heart: RRR no s3, s4, or murmurs, rubs, heaves or thrills Abdomen: Soft, non-tender, non-distended, BS + x 4.  Msk:  Strength and tone appears normal for age. Extremities: Trace pedal pedal. No clubbing or cyanosis. DP/PT/Radials 2+ and equal bilaterally. Neuro: Alert and oriented X 3. Moves all extremities spontaneously. Psych: Normal affect.  LABS:  Recent Labs  Basename 04/16/11 0500 04/14/11 0500   WBC 8.8 10.3   HGB 8.7* 8.6*   HCT 26.0* 25.2*   MCV 91.5 89.0   PLT 514* 446*    Lab 04/16/11 0500 04/14/11 0500 04/13/11 0355 04/12/11 0410  NA 135 132* 130* --  K 4.8 4.4 3.9 --  CL 99 94* 94* --  CO2 26 24 23  --  BUN 42* 40* 40* --  CREATININE 1.90* 1.86* 2.00* --  CALCIUM 8.5 8.5 7.6* --  PROT -- -- -- 5.7*  BILITOT -- -- -- 0.5  ALKPHOS --  -- -- 132*  ALT -- -- -- 11  AST -- -- -- 21  AMYLASE -- -- -- --  LIPASE -- -- -- --  GLUCOSE 158* 170* 146* --    TELE: NSR, 70-90 bpm, no acute events overnight  ECG: no new tracings  Radiology/Studies:  Dg Chest 2 View  04/14/2011  *RADIOLOGY REPORT*  Clinical Data: Status post CABG, weakness  CHEST - 2 VIEW  Comparison: 04/12/2011  Findings: Cardiomediastinal silhouette is stable.  Status post CABG.  Slight improvement in aeration.  Persistent small left pleural effusion with left basilar atelectasis or infiltrate.  No pulmonary edema.  Metallic fixation plate cervical spine again noted.  IMPRESSION: Status post CABG.  Slight improvement in aeration.  No pulmonary edema.  Persistent small left pleural effusion left basilar atelectasis or infiltrate.  Original Report Authenticated By: Natasha Mead, M.D.   Ct Head Wo Contrast  04/13/2011  *RADIOLOGY REPORT*  Clinical Data: Altered mental status, recent CABG.  CT HEAD WITHOUT CONTRAST  Technique:  Contiguous axial images were obtained from the base of the skull through the vertex without contrast.  Comparison: 04/08/2011  Findings: Hypoattenuation within the right occipital lobe, in keeping with subacute to remote infarction.  The gray-white distinction  at the same level on the left side is indistinct however likely because it is obscured by streak artifact.  There is no evidence for acute hemorrhage, hydrocephalus, mass lesion, or abnormal extra-axial fluid collection.  No definite CT evidence for acute infarction.  The visualized paranasal sinuses and mastoid air cells are predominately clear.  IMPRESSION: Right occipital lobe hypoattenuation is similar to prior.  No definite acute intracranial abnormality.  Original Report Authenticated By: Waneta Martins, M.D.   Ct Head Wo Contrast  04/08/2011  *RADIOLOGY REPORT*  Clinical Data: Garbled speech.  Confusion  CT HEAD WITHOUT CONTRAST  Technique:  Contiguous axial images were obtained from the  base of the skull through the vertex without contrast.  Comparison: None  Findings: There is a focal area of low density within the right occipital lobe image numbers 11 and 12.  This likely represents an area of subacute to chronic infarct.  The remaining portions of the cerebral and cerebellar hemispheres are normal in attenuation and morphology.  The ventricular volumes and sulci appear within normal limits.  There is no evidence for acute brain infarct, hemorrhage or mass.  The paranasal sinuses and the mastoid air cells appear clear.  The skull appears intact.  IMPRESSION:  1.  No acute intracranial abnormalities. 2.  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  Original Report Authenticated By: Rosealee Albee, M.D.   Dg Chest Port 1 View  04/12/2011  *RADIOLOGY REPORT*  Clinical Data: Postop open heart surgery.  PORTABLE CHEST - 1 VIEW  Comparison: 04/10/2011  Findings: Prior CABG.  Cardiomegaly.  Improving bibasilar atelectasis or infiltrates.  Small bilateral effusions.  No pneumothorax.  IMPRESSION: Improving but persistent bibasilar atelectasis or infiltrates. Small effusions.  Original Report Authenticated By: Cyndie Chime, M.D.   Dg Chest Port 1 View  04/10/2011  *RADIOLOGY REPORT*  Clinical Data: Status post CABG.  PORTABLE CHEST - 1 VIEW  Comparison: 04/09/2011  Findings: Right jugular sheath remains.  Residual mild edema is stable.  Stable left lower lobe atelectasis.  Slight increase in right lower lung atelectasis.  No pneumothorax or significant pleural effusions.  Stable cardiomegaly.  IMPRESSION: Increase in right lower lung atelectasis.  No pneumothorax identified.  Original Report Authenticated By: Reola Calkins, M.D.   Dg Chest Port 1 View  04/09/2011  *RADIOLOGY REPORT*  Clinical Data: Post CABG, follow-up atelectasis, history CHF, smoking, hypertension, diabetes  PORTABLE CHEST - 1 VIEW  Comparison: Portable exam 0545  hours compared to 04/08/2011  Findings: Interval removal of mediastinal drain, left thoracostomy tube, and Swan-Ganz catheter. Right jugular central venous catheter persist, tip projecting over SVC. Enlargement of cardiac silhouette post CABG. Pulmonary vascular congestion. Perihilar edema. Persistent atelectasis versus consolidation left lower lobe. No pneumothorax. Tiny left effusion. Overall aeration has improved since previous exam.  IMPRESSION: Improved aeration as above.  Original Report Authenticated By: Lollie Marrow, M.D.   Dg Chest Portable 1 View In Am  04/08/2011  *RADIOLOGY REPORT*  Clinical Data: 66 year old male status post open heart surgery.  PORTABLE CHEST - 1 VIEW  Comparison: 04/07/2011 and earlier.  Findings: Portable AP view at 0545 hours.  The patient is extubated.  Enteric tube has been removed.  Stable right IJ Swan- Ganz catheter.  Stable mediastinal drain and left chest tube. Intra-aortic balloon pump marker no longer identified.  Lower lung volumes.  Stable cardiac size and mediastinal contours.  Increased retrocardiac atelectasis.  Vascular congestion also  has increased but there is no overt pulmonary edema.  No pneumothorax.  Possible small effusions.  IMPRESSION: 1.  Extubated and enteric tube removed.  Lower lung volumes and increased atelectasis. 2.  Intra-aortic balloon pump no longer visible.  Mildly increased vascular congestion, no overt edema. 3.  No pneumothorax, probable small effusions.  Original Report Authenticated By: Harley Hallmark, M.D.   Dg Chest Portable 1 View In Am  04/07/2011  *RADIOLOGY REPORT*  Clinical Data: Bypass surgery.  PORTABLE CHEST - 1 VIEW  Comparison: 04/06/2011.  Findings: The support apparatus is stable. The IABP tip is in the aortic arch.  No left-sided pneumothorax.  The lungs demonstrate improved aeration with resolving edema, atelectasis and effusions.  IMPRESSION:  1.  Stable support apparatus. 2.  Improved aeration with resolving edema,  atelectasis and effusions.  Original Report Authenticated By: P. Loralie Champagne, M.D.   Dg Chest Portable 1 View  04/06/2011  *RADIOLOGY REPORT*  Clinical Data: Coronary bypass  PORTABLE CHEST - 1 VIEW  Comparison: 04/06/2011  Findings: Endotracheal tube is high at the C7 level 10 cm above the carina.  This can be advanced 5 cm.  Right IJ approach Swan-Ganz catheter is in the central pulmonary outflow tract.  NG tube enters the stomach.  Mediastinal drain and left chest tube noted.  Aortic balloon pump marker is in the proximal descending thoracic aorta. Coronary bypass changes evident.  Heart remains enlarged with residual asymmetric edema, more pronounced in the right lung. Small right effusion suspected layering posteriorly.  No pneumothorax.  Basilar atelectasis present.  IMPRESSION: High endotracheal tube 10 cm above the carina, could be advanced 5 cm.  Stable CHF pattern and atelectasis  Possible posterior right effusion  No pneumothorax  Original Report Authenticated By: Judie Petit. Ruel Favors, M.D.   Dg Chest Portable 1 View  04/06/2011  *RADIOLOGY REPORT*  Clinical Data: 66 year old male with severe shortness of breath.  PORTABLE CHEST - 1 VIEW  Comparison: None.  Findings: Seated AP portable views of the chest at 0920 hours.  The diffuse basilar predominant increased interstitial markings. Indistinctness of pulmonary vasculature.  Cardiac size mildly enlarged. Other mediastinal contours are within normal limits. Questionable 14 mm nodule in the right upper lobe, seen only on a single view.  No pneumothorax.  No large effusion.  Previous cervical spine ACDF.  IMPRESSION: 1.  Lung findings most suggestive of acute pulmonary edema.  Severe viral / atypical pneumonia is the main differential. 2.  Probably artifactual vague 14 mm right upper lung nodule seen only on one of the two views.  Attention on follow-up.  Original Report Authenticated By: Harley Hallmark, M.D.    Current Medications:     . aspirin EC   325 mg Oral Daily   Or  . aspirin  324 mg Per Tube Daily  . atorvastatin  80 mg Oral q1800  . bisacodyl  10 mg Oral Daily   Or  . bisacodyl  10 mg Rectal Daily  . docusate sodium  200 mg Oral Daily  . enoxaparin (LOVENOX) injection  30 mg Subcutaneous Q24H  . furosemide  40 mg Oral Daily  . insulin aspart  0-20 Units Subcutaneous TID WC  . insulin aspart  0-5 Units Subcutaneous QHS  . linagliptin  5 mg Oral Daily  . metoprolol succinate  25 mg Oral Daily  . pantoprazole  40 mg Oral Q1200  . potassium chloride  20 mEq Oral Daily  . sodium chloride  3 mL Intravenous  Q12H    ASSESSMENT AND PLAN:  1. CAD/ant STEMI- presented on 04/06/11, transported emergently for cath revealing severe left main and three-vessel CAD warranting emergent CABG x 5. Echo on 03/11: LVEF 30-35%, mild LVH, mid-distalanteroseptal and apical myocardial akinesis and grade 2 diastolic dysfunction, moderately dilated LA. He has steadily progressed and "feels good" today. No further chest pain, shortness of breath, lightheadedness or diaphoresis. He is stable and is to be discharged home today by primary team. Have encouraged risk factor reduction.  - Continue ASA (full-dose per neuro recs)/BB/statin/Lasix + KCl/NTG SL PRN  2. HTN- labile BPs this admission, stable currently   - Continue BB/Lasix 3. HL  - Continue statin  4. DMII- HgbA1C 8.2  - Continue Tradjenta per diabetes coordinator recs  5. Tobacco abuse- underwent tobacco cessation counseling, stressed to stop smoking   6. Dispo- discharge today per primary team. I have called and made him an appointment to follow-up at the [facility] office.   Signed, R. Hurman Horn, PA-C 04/16/2011, 8:46 AM

## 2011-04-16 NOTE — Progress Notes (Signed)
Patient EPW dcd as ordered and per protocol bp 106/52 heart rate 91 SR pt tolerated well, all ends intact. Pt reminded to lie supine approximately one hour. Will monitor patient. Sostenes Kauffmann, Randall An RN

## 2011-04-16 NOTE — Progress Notes (Signed)
Pt discharge instructions and patient education complete. IV site d/c. Site WNL. No s/s of distress. D/C home via wheelchair. Kurt Richard  

## 2011-04-19 ENCOUNTER — Telehealth: Payer: Self-pay | Admitting: *Deleted

## 2011-04-19 ENCOUNTER — Telehealth: Payer: Self-pay | Admitting: Pharmacist

## 2011-04-19 NOTE — Telephone Encounter (Signed)
[name] [facility] had called earlier today reporting that his FBS today was 235. Since he does not have a PCP as yet, I notified Dr. Dorris Fetch of same.  He recommended increasing his Trajenta from 5mg m to 10 mgm per day. I called his caregiver and he agrees

## 2011-04-19 NOTE — Telephone Encounter (Signed)
Tried to reach pt but phone services states call would not be put through from these numbers.  Attempted on 3 different phone lines with same result.

## 2011-04-20 NOTE — Telephone Encounter (Signed)
Spoke with emergency contact.  They are aware of upcoming appts.  Pt has home health RN in the home and is helping coordinate care.

## 2011-04-20 NOTE — Telephone Encounter (Signed)
Unable to reach pt.  LM with emergency contact.

## 2011-04-22 ENCOUNTER — Telehealth: Payer: Self-pay

## 2011-04-22 NOTE — Telephone Encounter (Signed)
Pt has decubitus on sacral area 3.cm long by cm wide. He has no PCP yet, but is working on it. Requesting Hydrocolloid dsg'ing to area for [place]. He is sch'ed for post op appt  05/06/11 with Dr Dorris Fetch. The OK was given to the request.

## 2011-04-23 ENCOUNTER — Telehealth: Payer: Self-pay | Admitting: *Deleted

## 2011-04-23 ENCOUNTER — Ambulatory Visit (INDEPENDENT_AMBULATORY_CARE_PROVIDER_SITE_OTHER): Payer: 59 | Admitting: Physician Assistant

## 2011-04-23 ENCOUNTER — Encounter: Payer: Self-pay | Admitting: Physician Assistant

## 2011-04-23 VITALS — BP 106/54 | HR 107 | Ht 68.0 in | Wt 165.8 lb

## 2011-04-23 DIAGNOSIS — I2589 Other forms of chronic ischemic heart disease: Secondary | ICD-10-CM

## 2011-04-23 DIAGNOSIS — I6340 Cerebral infarction due to embolism of unspecified cerebral artery: Secondary | ICD-10-CM

## 2011-04-23 DIAGNOSIS — I5022 Chronic systolic (congestive) heart failure: Secondary | ICD-10-CM

## 2011-04-23 DIAGNOSIS — R0602 Shortness of breath: Secondary | ICD-10-CM

## 2011-04-23 DIAGNOSIS — I10 Essential (primary) hypertension: Secondary | ICD-10-CM

## 2011-04-23 DIAGNOSIS — I2510 Atherosclerotic heart disease of native coronary artery without angina pectoris: Secondary | ICD-10-CM

## 2011-04-23 DIAGNOSIS — I255 Ischemic cardiomyopathy: Secondary | ICD-10-CM

## 2011-04-23 DIAGNOSIS — F17200 Nicotine dependence, unspecified, uncomplicated: Secondary | ICD-10-CM

## 2011-04-23 DIAGNOSIS — Z720 Tobacco use: Secondary | ICD-10-CM

## 2011-04-23 DIAGNOSIS — E785 Hyperlipidemia, unspecified: Secondary | ICD-10-CM

## 2011-04-23 DIAGNOSIS — E119 Type 2 diabetes mellitus without complications: Secondary | ICD-10-CM

## 2011-04-23 LAB — BRAIN NATRIURETIC PEPTIDE: Pro B Natriuretic peptide (BNP): 2584 pg/mL — ABNORMAL HIGH (ref 0.0–100.0)

## 2011-04-23 LAB — BASIC METABOLIC PANEL
BUN: 41 mg/dL — ABNORMAL HIGH (ref 6–23)
Creatinine, Ser: 1.5 mg/dL (ref 0.4–1.5)
GFR: 49.69 mL/min — ABNORMAL LOW (ref >60.00)
Potassium: 4.4 mEq/L (ref 3.5–5.1)

## 2011-04-23 MED ORDER — NITROGLYCERIN 0.4 MG SL SUBL: 0.4000 mg | SUBLINGUAL_TABLET | SUBLINGUAL | Status: DC | PRN

## 2011-04-23 MED ORDER — FUROSEMIDE 40 MG PO TABS: 40.0000 mg | ORAL_TABLET | Freq: Every day | ORAL | Status: DC

## 2011-04-23 MED ORDER — METOPROLOL SUCCINATE ER 25 MG PO TB24: 25.0000 mg | ORAL_TABLET | Freq: Two times a day (BID) | ORAL | Status: DC

## 2011-04-23 MED ORDER — POTASSIUM CHLORIDE ER 10 MEQ PO TBCR: 10.0000 meq | EXTENDED_RELEASE_TABLET | Freq: Every day | ORAL | Status: DC

## 2011-04-23 NOTE — Progress Notes (Signed)
[address] [place], [place]  [address] Phone: [phone] Fax:  [phone]  Date:  04/23/2011   Name:  Kurt Richard       DOB:  Oct 07, 1945 MRN:  [id number]  PCP:  None  Primary Cardiologist:  Dr. Delane Ginger  Primary Electrophysiologist:  None    History of Present Illness: Kurt Richard is a 66 y.o. male who presents for post hospital follow up.  He has a history of hypertension, hyperlipidemia and diabetes.  He was admitted 3/5-3/15 with an acute anterior STEMI, late presentation.  LHC 3/5 demonstrated severe ostial left main 95% stenosis and otherwise three-vessel CAD with an ejection fraction of 15%.  He was taken for emergent CABG.  This was performed by Dr. Dorris Fetch.  Grafts included a LIMA-LAD, SVG-D1, SVG-OM1, SVG-PDA and PL.  Postoperatively he required intra-aortic balloon support.  He developed acute renal failure.  He also suffered a cardioembolic stroke felt to be in the left PCA territory.  He was seen by neurology.  MRI could not be done.  Aspirin was recommended.  Follow up echo 04/12/11: Mild LVH, EF 30-35%, mid to distal anteroseptal and apical hypokinesis, grade 2 diastolic dysfunction, moderate LAE, mild RAE.  Labs at discharge: Potassium 4.8, creatinine 1.9, TC 120, triglycerides 115, HDL 15, LDL 82, Hemoglobin 8.7, A1c 8.2.  Pre-CABG Dopplers: LICA 40-59%, RICA without stenosis.  Of note, chest x-ray upon admission demonstrated pulmonary edema and a probable artifactual vague 14 mm right upper lung nodule seen on only one of 2 views.  This was not described on any other chest x-ray during his admission.  He is slowly progressing.  He is walking a little bit each day.  He is working with home health physical therapy.  His chest is sore.  He denies any angina.  He denies fevers or chills.  He has a nonproductive cough.  No purulent sputum or hemoptysis.  His appetite is poor.  His sugars have been running high.  Somebody through home health as  already adjusted his diabetic regimen.  This was done 2 days ago.  He is having difficulty with shortness of breath when laying flat and he also describes PND.  Weights at home are going down.  He is currently off of his Lasix.  He has not noticed any worsening symptoms since he stopped his Lasix.  Past Medical History  Diagnosis Date  . Diabetes mellitus   . Hypertension   . Chronic systolic heart failure     echo 04/12/11: Mild LVH, EF 30-35%, mid to distal anteroseptal and apical hypokinesis, grade 2 diastolic dysfunction, moderate LAE, mild RAE.  [name] Hyperlipidemia   . Tobacco abuse   . CAD (coronary artery disease)     acute anterior STEMI, late presentation 04/06/11 LHC 3/5 demonstrated severe ostial left main 95% stenosis and otherwise three-vessel CAD with an ejection fraction of 15%.  Emergent CABG: Dr. Dorris Fetch - Grafts: LIMA-LAD, SVG-D1, SVG-OM1, SVG-PDA and PL.  . Ischemic cardiomyopathy   . DM2 (diabetes mellitus, type 2)   . Cardioembolic stroke     post bypass 04/2011    Current Outpatient Prescriptions  Medication Sig Dispense Refill  . aspirin EC 325 MG EC tablet Take 1 tablet (325 mg total) by mouth daily.  30 tablet    . atorvastatin (LIPITOR) 80 MG tablet Take 1 tablet (80 mg total) by mouth daily at 6 PM.  30 tablet  1  . linagliptin (TRADJENTA) 5 MG TABS  tablet Take 1 tablet (5 mg total) by mouth daily.  30 tablet  1  . metoprolol succinate (TOPROL-XL) 25 MG 24 hr tablet Take 1 tablet (25 mg total) by mouth daily.  30 tablet  1  . oxyCODONE (OXY IR/ROXICODONE) 5 MG immediate release tablet Take 1-2 tablets (5-10 mg total) by mouth every 4 (four) hours as needed.  50 tablet  0    Allergies: Allergies  Allergen Reactions  . Cardizem Hives  . Nifedipine Swelling    History  Substance Use Topics  . Smoking status: Former Smoker -- 0.5 packs/day    Types: Cigarettes  . Smokeless tobacco: Not on file  . Alcohol Use: No     ROS:  Please see the history of present  illness.   He denies vomiting, diarrhea, melena, hematochezia.  All other systems reviewed and negative.   PHYSICAL EXAM: VS:  BP 106/54  Pulse 107  Ht 5\' 8"  (1.727 m)  Wt 165 lb 12.8 oz (75.206 kg)  BMI 25.21 kg/m2 Well nourished, well developed, in no acute distress HEENT: normal Neck: minimally elevated JVP Cardiac:  normal S1, S2; RRR; no murmur Lungs:  clear to auscultation bilaterally, no wheezing, rhonchi or rales Abd: soft, nontender, no hepatomegaly Ext: no edema; right groin without hematoma or bruit  Skin: warm and dry Neuro:  CNs 2-12 intact, no focal abnormalities noted  EKG:  Sinus tachycardia, heart rate 107, left axis deviation, T wave inversions in 1, aVL, poor R-wave progression, similar to prior tracings  ASSESSMENT AND PLAN:  1. CAD (coronary artery disease)  Slowly progressing since his MI and bypass surgery.  He is still working with home health.  Continue aspirin and statin.  Once he has completed home health, he will contact us to establish cardiac rehabilitation.  HR too fast.  Will adjust Toprol to 25 mg BID.  He sees Dr. Dorris Fetch 05/06/11.  Followup with Dr. Delane Ginger in 3-4 weeks.   2. Ischemic cardiomyopathy  BP too soft to add ACE.  Plus, he had ARF in the hospital.  Will check BMET today.  Hopefully add ACE in the future.  Follow up with Dr. Delane Ginger in 3-4 weeks.  Will likely need follow up echo in 12 weeks to reassess LVF.   3. Chronic systolic heart failure  I think he likely is volume overloaded.  His symptoms of orthopnea and PND suggest this.  I will place him back on Lasix 40 mg QD and K+ 10 mEq QD.  Check BMET and BNP today. Check BMET again in one week.  Follow up in 3-4 weeks.    4. Hypertension  Controlled.     5. Hyperlipidemia  Continue Lipitor.   6. Tobacco abuse  He has quit.  One CXR abnormal in the hospital (? Nodule).  He has a follow up CXR with [facility] prior to his appt 4/4.   7. Diabetes mellitus  He needs a PCP.   Tradjenta just adjusted this week.  He knows Dr. Tawanna Cooler and would like to see him.  Will refer.   8. Cardioembolic stroke  Progressing well.  He will continue HHPT and follow up with neuro in the future.     Luna Glasgow, PA-C  9:23 AM 04/23/2011

## 2011-04-23 NOTE — Patient Instructions (Signed)
Your physician recommends that you schedule a follow-up appointment in: 3-4 WEEKS WITH DR. Elease Hashimoto  Your physician recommends that you return for lab work in: BMET, BNP  Your physician recommends that you return for lab work in: REPEAT 1 WEEK WITH HOME HEALTH FOR A BMET TO BE DRAWN DX 428.22  Your physician has recommended you make the following change in your medication: INCREASE TOPROL XL 25 MG 1 TABLET TWICE DAILY; START BACK ON LASIX 40 MG DAILY AND POTASSIUM 10 MEQ DAILY;   A PRESCRIPTION FOR NITROGLYCERIN 0.4 SL HAS BEEN SENT IN TODAY FOR YOU AS WELL  DRINK GLUCERNA THREE TIMES DAILY WITH MEALS  You have been referred to PT NEEDS TO RE-EST WITH DR. TODD OR ANOTHER PRIMARY CARE PHYSICIAN DOE HIS DIABETES  PLEASE CALL [phone] AND LET us KNOW WHEN YOU HAVE FINISHED WITH THE HOME HEALTH PT, SO THAT WE MAY START YOU WITH CARDIAC REHAB

## 2011-04-23 NOTE — Telephone Encounter (Signed)
s/w Morrie Sheldon @  [facility] today and gave verbal order for repeat bmet to be done on 04/29/11 w/results faxed to Tereso Newcomer, PA-C, Tyler Deis se is also aware of increase in toprol and re-start of lasix and K+,. Danielle Rankin

## 2011-04-26 ENCOUNTER — Telehealth: Payer: Self-pay | Admitting: Cardiovascular Disease

## 2011-04-26 NOTE — Telephone Encounter (Signed)
Reviewed meds, pt was on both meds (lasix/potassium)  while in hospital without problems, no diarrhea today. Told pt that diarrhea can be a side effect of lasix but not a high incidence. He was just in hospital and may have a viral bug.   Pt was able to do his Physical therapy today at home and no diarrhea even after eating a bit. Told to follow BRAT diet and sip room temp fluids/ advised no caffeine/  every 15 minutes. Told to call back with worsening symptoms. Pt agreed to plan.

## 2011-04-26 NOTE — Telephone Encounter (Signed)
Pt was put on lasix and potassium by scott on Friday, pt had a terrible weekend with uncontrollible diarrhea, pls call

## 2011-04-27 ENCOUNTER — Encounter: Payer: Self-pay | Admitting: Cardiovascular Disease

## 2011-04-27 NOTE — Telephone Encounter (Signed)
Agree. If diarrhea persisting, he can hold Lasix and K+ for 3 days, then resume (to avoid dehydration with Lasix and diarrhea).  Make sure he gets follow up labs as planned previously (this week). Tereso Newcomer, PA-C  12:56 PM 04/27/2011

## 2011-04-27 NOTE — Telephone Encounter (Signed)
No diarrhea yesterday but started again this afternoon uncontrollable. Told to hold meds three days and hydrate as described prior. Nurse came for blood draw today but was unable to obtain, will have another nurse come and try. Told to call with further questions or concerns, caretaker agreed to plan.

## 2011-04-29 ENCOUNTER — Other Ambulatory Visit: Payer: Self-pay | Admitting: Thoracic Surgery (Cardiothoracic Vascular Surgery)

## 2011-04-29 ENCOUNTER — Encounter: Payer: Self-pay | Admitting: Cardiovascular Disease

## 2011-04-29 DIAGNOSIS — I2510 Atherosclerotic heart disease of native coronary artery without angina pectoris: Secondary | ICD-10-CM

## 2011-05-03 ENCOUNTER — Other Ambulatory Visit: Payer: Self-pay

## 2011-05-03 ENCOUNTER — Emergency Department (INDEPENDENT_AMBULATORY_CARE_PROVIDER_SITE_OTHER)
Admission: EM | Admit: 2011-05-03 | Discharge: 2011-05-03 | Disposition: A | Discharge status: Other Acute Inpatient Hospital | Payer: 59 | Source: Home / Self Care | Attending: Family Medicine | Admitting: Family Medicine

## 2011-05-03 ENCOUNTER — Emergency Department (HOSPITAL_COMMUNITY)
Admission: EM | Admit: 2011-05-03 | Discharge: 2011-05-04 | Disposition: A | Discharge status: Home / Self Care | Payer: 59 | Attending: Emergency Medicine | Admitting: Emergency Medicine

## 2011-05-03 ENCOUNTER — Encounter (HOSPITAL_COMMUNITY): Payer: Self-pay | Admitting: *Deleted

## 2011-05-03 ENCOUNTER — Encounter (HOSPITAL_COMMUNITY): Payer: Self-pay

## 2011-05-03 ENCOUNTER — Telehealth: Payer: Self-pay | Admitting: *Deleted

## 2011-05-03 DIAGNOSIS — I2510 Atherosclerotic heart disease of native coronary artery without angina pectoris: Secondary | ICD-10-CM | POA: Insufficient documentation

## 2011-05-03 DIAGNOSIS — R197 Diarrhea, unspecified: Secondary | ICD-10-CM

## 2011-05-03 DIAGNOSIS — Z8673 Personal history of transient ischemic attack (TIA), and cerebral infarction without residual deficits: Secondary | ICD-10-CM | POA: Insufficient documentation

## 2011-05-03 DIAGNOSIS — E119 Type 2 diabetes mellitus without complications: Secondary | ICD-10-CM | POA: Insufficient documentation

## 2011-05-03 DIAGNOSIS — Z7982 Long term (current) use of aspirin: Secondary | ICD-10-CM | POA: Insufficient documentation

## 2011-05-03 DIAGNOSIS — I10 Essential (primary) hypertension: Secondary | ICD-10-CM | POA: Insufficient documentation

## 2011-05-03 DIAGNOSIS — R000 Tachycardia, unspecified: Secondary | ICD-10-CM | POA: Insufficient documentation

## 2011-05-03 DIAGNOSIS — E785 Hyperlipidemia, unspecified: Secondary | ICD-10-CM | POA: Insufficient documentation

## 2011-05-03 DIAGNOSIS — Z87891 Personal history of nicotine dependence: Secondary | ICD-10-CM | POA: Insufficient documentation

## 2011-05-03 DIAGNOSIS — I2589 Other forms of chronic ischemic heart disease: Secondary | ICD-10-CM | POA: Insufficient documentation

## 2011-05-03 DIAGNOSIS — Z951 Presence of aortocoronary bypass graft: Secondary | ICD-10-CM | POA: Insufficient documentation

## 2011-05-03 DIAGNOSIS — I5022 Chronic systolic (congestive) heart failure: Secondary | ICD-10-CM | POA: Insufficient documentation

## 2011-05-03 LAB — URINALYSIS, ROUTINE W REFLEX MICROSCOPIC
Glucose, UA: NEGATIVE mg/dL
Ketones, ur: NEGATIVE mg/dL
Leukocytes, UA: NEGATIVE
Nitrite: NEGATIVE
Protein, ur: 100 mg/dL — AB
pH: 5 (ref 5.0–8.0)

## 2011-05-03 LAB — COMPREHENSIVE METABOLIC PANEL
ALT: 17 U/L (ref 0–53)
AST: 20 U/L (ref 0–37)
Calcium: 9.1 mg/dL (ref 8.4–10.5)
Creatinine, Ser: 1.18 mg/dL (ref 0.50–1.35)
Sodium: 138 mEq/L (ref 135–145)
Total Protein: 7.2 g/dL (ref 6.0–8.3)

## 2011-05-03 LAB — URINE MICROSCOPIC-ADD ON

## 2011-05-03 LAB — DIFFERENTIAL
Basophils Absolute: 0.1 10*3/uL (ref 0.0–0.1)
Basophils Relative: 1 % (ref 0–1)
Eosinophils Absolute: 0.3 10*3/uL (ref 0.0–0.7)
Eosinophils Relative: 3 % (ref 0–5)
Lymphocytes Relative: 25 % (ref 12–46)
Monocytes Absolute: 0.7 10*3/uL (ref 0.1–1.0)

## 2011-05-03 LAB — CBC
MCH: 29.9 pg (ref 26.0–34.0)
MCHC: 33.8 g/dL (ref 30.0–36.0)
MCV: 88.5 fL (ref 78.0–100.0)
Platelets: 346 10*3/uL (ref 150–400)
RDW: 15.9 % — ABNORMAL HIGH (ref 11.5–15.5)
WBC: 8.7 10*3/uL (ref 4.0–10.5)

## 2011-05-03 LAB — POCT I-STAT TROPONIN I: Troponin i, poc: 0.07 ng/mL (ref 0.00–0.08)

## 2011-05-03 LAB — PROTIME-INR: INR: 1.09 (ref 0.00–1.49)

## 2011-05-03 MED ORDER — ONDANSETRON HCL 4 MG/2ML IJ SOLN
4.0000 mg | Freq: Once | INTRAMUSCULAR | Status: AC
  Administered 2011-05-03: 4 mg via INTRAVENOUS
  Filled 2011-05-03: qty 2

## 2011-05-03 MED ORDER — SODIUM CHLORIDE 0.9 % IV SOLN
INTRAVENOUS | Status: DC
  Administered 2011-05-03: 100 mL/h via INTRAVENOUS

## 2011-05-03 MED ORDER — SODIUM CHLORIDE 0.9 % IV SOLN
Freq: Once | INTRAVENOUS | Status: AC
  Administered 2011-05-03: 23:00:00 via INTRAVENOUS

## 2011-05-03 MED ORDER — METRONIDAZOLE 500 MG PO TABS: 500.0000 mg | ORAL_TABLET | Freq: Three times a day (TID) | ORAL | Status: DC

## 2011-05-03 MED ORDER — SODIUM CHLORIDE 0.9 % IV BOLUS (SEPSIS)
1000.0000 mL | Freq: Once | INTRAVENOUS | Status: AC
  Administered 2011-05-03: 1000 mL via INTRAVENOUS

## 2011-05-03 NOTE — ED Provider Notes (Signed)
History     CSN: [id number]  Arrival date & time 05/03/11  1746   First MD Initiated Contact with Patient 05/03/11 1757      Chief Complaint  Patient presents with  . Diarrhea    (Consider location/radiation/quality/duration/timing/severity/associated sxs/prior treatment) Patient is a 66 y.o. male presenting with diarrhea. The history is provided by the patient and a relative.  Diarrhea The primary symptoms include diarrhea. Primary symptoms do not include fever, abdominal pain, nausea, vomiting, melena or hematochezia. The illness began more than 7 days ago (began 1 day after hosp d/c, protracted, persistent). The problem has not changed since onset. The illness is also significant for anorexia. Associated medical issues comments: s/p recent hosp for CABG, .    Past Medical History  Diagnosis Date  . Diabetes mellitus   . Hypertension   . Chronic systolic heart failure     echo 04/12/11: Mild LVH, EF 30-35%, mid to distal anteroseptal and apical hypokinesis, grade 2 diastolic dysfunction, moderate LAE, mild RAE.  [name] Hyperlipidemia   . Tobacco abuse   . CAD (coronary artery disease)     acute anterior STEMI, late presentation 04/06/11 LHC 3/5 demonstrated severe ostial left main 95% stenosis and otherwise three-vessel CAD with an ejection fraction of 15%.  Emergent CABG: Dr. Dorris Fetch - Grafts: LIMA-LAD, SVG-D1, SVG-OM1, SVG-PDA and PL.  . Ischemic cardiomyopathy   . DM2 (diabetes mellitus, type 2)   . Cardioembolic stroke     post bypass 04/2011    Past Surgical History  Procedure Date  . Coronary artery bypass graft 04/06/2011    Procedure: CORONARY ARTERY BYPASS GRAFTING (CABG);  Surgeon: Loreli Slot, MD;  Location: [facility] OR;  Service: Open Heart Surgery;  Laterality: N/A;  Coronary Artery Bypass Graft times four utilizing the left internal mammary artery and the Right and left  greater Saphenous veins Harvested endoscopically.  . Fibroid bypass 04/06/11     Family  History  Problem Relation Age of Onset  . Heart attack Mother     ?74s  . Heart attack Sister     80s    History  Substance Use Topics  . Smoking status: Former Smoker -- 0.5 packs/day    Types: Cigarettes  . Smokeless tobacco: Not on file  . Alcohol Use: No      Review of Systems  Constitutional: Positive for appetite change. Negative for fever.  Gastrointestinal: Positive for diarrhea and anorexia. Negative for nausea, vomiting, abdominal pain, blood in stool, melena and hematochezia.    Allergies  Cardizem and Nifedipine  Home Medications   Current Outpatient Rx  Name Route Sig Dispense Refill  . OXYCODONE HCL 5 MG PO CAPS Oral Take 5 mg by mouth every 4 (four) hours as needed. For pain    . ASPIRIN 325 MG PO TBEC Oral Take 325 mg by mouth daily.    . ATORVASTATIN CALCIUM 80 MG PO TABS Oral Take 80 mg by mouth daily at 6 PM.    . FUROSEMIDE 40 MG PO TABS Oral Take 40 mg by mouth daily. Hold per physician    . LINAGLIPTIN 5 MG PO TABS Oral Take 2 tablets (10 mg total) by mouth daily.    [name] METOPROLOL SUCCINATE ER 25 MG PO TB24 Oral Take 25 mg by mouth 2 (two) times daily.    [name] METRONIDAZOLE 500 MG PO TABS Oral Take 1 tablet (500 mg total) by mouth 3 (three) times daily. One po bid x 7 days 42 tablet 0  .  NITROGLYCERIN 0.4 MG SL SUBL Sublingual Place 0.4 mg under the tongue every 5 (five) minutes as needed. For chest pain    . POTASSIUM CHLORIDE ER 10 MEQ PO TBCR Oral Take 10 mEq by mouth daily. Hold per physician      BP 124/78  Pulse 101  Temp(Src) 98.1 F (36.7 C) (Oral)  Resp 16  SpO2 98%  Physical Exam  Nursing note and vitals reviewed. Constitutional: He is oriented to person, place, and time. He appears cachectic. He is cooperative. He has a sickly appearance. He appears distressed.  Neck: Normal range of motion. Neck supple.  Cardiovascular: Normal rate.   Pulmonary/Chest: Effort normal and breath sounds normal.  Abdominal: Soft. There is no tenderness.    Neurological: He is alert and oriented to person, place, and time.  Skin: Skin is warm and dry.    ED Course  Procedures (including critical care time)  Labs Reviewed - No data to display No results found.   1. Diarrhea       MDM        Linna Hoff, MD 05/04/11 [phone]

## 2011-05-03 NOTE — ED Notes (Signed)
Pt reports diarrhea 04/17/2011, unable to eat food reports it runs straight through, increase diarrhea starting last night, pt drinking protein supplement drinks reports it runs straight through him as well, pt's pcp started back on Lasix and Potassium 04/17/2011, pcp d/c'd pt's Lasix and K+ last Wednesday w/no relief in diarrhea. Denies fever, chills, chest/back/abd pain or vomiting.

## 2011-05-03 NOTE — ED Notes (Signed)
Reports getting discharged from hospital S/P fibroid bypass surgery (04/06/11) on 3/15.  C/O persistent diarrhea onset approx 2 days after returning home.  Denies any blood in stool.  Physician told pt to have stool checked for C. Diff.  Denies any fevers.

## 2011-05-03 NOTE — Discharge Instructions (Signed)
Clostridium Difficile Toxin This is a test which may be done when a patient has diarrhea that lasts for several days, or has abdominal pain, fever, and nausea after antibiotic therapy.  This test looks for the presence of Clostridium difficile (C.diff.) toxin in a stool sample. C.diff. is a germ (bacterium) that is one of the groups of bacteria that are usually in the colon, called "normal flora." If something upsets the growth of the other normal flora, C.diff. may overgrow and disrupt the balance of bacteria in the colon. C. diff. may produce two toxins, A and B. The combination of overgrowth and toxins may cause prolonged diarrhea. The toxins may damage the lining of the colon and lead to colitis.  While some cases of C. diff. diarrhea and colitis do not require treatment, others require specific oral antibiotic therapy. Most patients improve as the normal flora re-establishes itself, but about some may have one or more relapses, with symptoms and detectible toxin levels coming back. PREPARATION FOR TEST There is no special preparation for the test. A fresh stool sample is collected in a sterile container. The sample should not be mixed with urine or water. The stool should be taken to the lab within an hour. It may be refrigerated or frozen and taken to the lab as soon as possible. The container should be labeled with your name and the date and time of the stool collection.  NORMAL FINDINGS Negative Tissue Culture (no toxin identified) Ranges for normal findings may vary among different laboratories and hospitals. You should always check with your doctor after having lab work or other tests done to discuss the meaning of your test results and whether your values are considered within normal limits. MEANING OF TEST  Your caregiver will go over the test results with you and discuss the importance and meaning of your results, as well as treatment options and the need for additional tests if  necessary. OBTAINING THE TEST RESULTS It is your responsibility to obtain your test results. Ask the lab or department performing the test when and how you will get your results. Document Released: 02/11/2004 Document Revised: 01/07/2011 Document Reviewed: 12/28/2007 [facility] Patient Information 2012 [place], [place].Antibiotic-Associated Diarrhea You or your child's diarrhea is due to taking an antibiotic medicine. This is a common reaction to these drugs. It does not mean you are allergic to the medicine. The diarrhea is usually not severe. The stools will return to normal several days after completing the antibiotic treatment. If a diaper rash occurs, you can wash the irritated area carefully. Then apply a cream or an ointment to protect the skin. Normally it is best to complete the antibiotic treatment. To reduce symptoms avoid:  Apple and grape fruit juices.   Dairy products.   Beans.  Encourage plenty of clear fluids, such as water or sports drinks. Cultured dairy products such as yogurt may help restore normal intestinal bacteria. Medicines to control the diarrhea may help reduce symptoms. But these should not be used if the stool is bloody.  SEEK IMMEDIATE MEDICAL CARE IF:   Diarrhea does not improve after completing the antibiotic medicine.   You notice a fever, bloody stools, increased pain, or vomiting.  Document Released: 02/26/2004 Document Revised: 01/07/2011 Document Reviewed: 08/24/2007 [facility] Patient Information 2012 [place], [place].Chronic Diarrhea Diarrhea is loose, watery stools. Having diarrhea means passing loose stools 3 or more times a day. Diarrhea that lasts longer than 4 weeks is considered long-lasting (chronic). Symptoms of chronic diarrhea may be continual  or may come and go. People of all ages can get diarrhea. Body fluid loss (dehydration) may occur as a result of diarrhea. This means the body does not have as many fluids and salts (electrolytes) as it  needs. CAUSES  There are many causes of chronic diarrhea. Causes may be different for children and adults. The various causes can be grouped into 2 categories: diarrhea caused by an infection and diarrhea not caused by an infection. Sometimes, the cause is unknown. Diarrhea caused by an infection may result from:  Parasites.   Bacteria.   Viral infections.  Diarrhea not caused by an infection may result from:  Irritable bowel syndrome.   Reaction to medicines, such as antibiotics, cancer drugs, blood pressure medicines, and antacids.   Intestinal disease (Crohn's disease, ulcerative colitis, celiac disease).   Food allergies or sensitivity to additives (fructose, lactose, sugar substitutes).   Tumors.   Diabetes, thyroid disease, and other endocrine diseases.   Reduced blood flow to the intestine.   Previous surgery or radiation of the abdomen or gastrointestinal tract.  Risk factors for chronic diarrhea include:  Having a severely weakened immune system, such as from HIV/AIDS.   Taking certain types of cancer-fighting drugs (chemotherapy) or other medicines.   A recent organ transplant.   Having a portion of the stomach removed.   Traveling to countries where food and water supplies are often contaminated.  SYMPTOMS  In addition to frequent, loose stools, diarrhea may cause:  Cramping.   Abdominal pain.   Nausea.   Urgent need to use the bathroom, or loss of bowel control.  If dehydration occurs, problems include:  Thirst.   Less frequent urination.   Dark urine.   Dry skin.   Fatigue.   Dizziness.  Infections that cause diarrhea may also cause a fever, chills, or bloody stools. DIAGNOSIS  Diagnosis may be difficult. Your caregiver must take a careful history and perform a physical exam. Tests given are based on your symptoms and history. Tests may include:  Blood or stool tests, in which 3 or more stool samples may be examined. Stool cultures may be  used to test for bacteria or parasites.   X-rays.   A procedure in which a thin tube is inserted into the mouth or rectum (endoscopy). This allows the caregiver to look inside the intestine.  TREATMENT   Diarrhea caused by an infection can often be treated with antibiotics.   Diarrhea not caused by an infection is more difficult to diagnose and treat. Long-term medicine use or surgery may be required. Specific treatment should be discussed with your caregiver.   If the cause cannot determined, treatment to relieve symptoms includes:   Preventing dehydration. Serious health problems can occur if you do not maintain proper fluid levels. Many oral rehydration solutions (ORS) are available at drug stores. Ask your caregiver what product is best for you.   Not drinking beverages that contain caffeine (tea, coffee, soft drinks).   Not drinking alcohol. It causes dehydration.   Not relying on sports drinks and broths alone to maintain proper fluid levels. They should not be used to prevent severe dehydration.   Maintaining well-balanced nutrition. This may help you recover faster.  PREVENTION   Drink clean or purified water.   Use proper food handling techniques.   Maintain proper hand-washing habits.  HOME CARE INSTRUCTIONS   Avoid:   Caffeine.   Greasy foods.   High fiber.   If you have problems digesting lactose during  or after an episode of diarrhea, you might want to try yogurt. Yogurt is often better tolerated, because it has less lactose than milk. Yogurt with active, live bacterial cultures may even help you recover faster.  SEEK MEDICAL CARE IF:  The person with diarrhea is an otherwise healthy adult and has:  Signs of dehydration.   Diarrhea for more than 2 days.   Severe pain in the abdomen or rectum.   An oral temperature above 102 F (38.9 C).   Stools containing blood or pus.   Stools that are black and tarry.  SEEK IMMEDIATE MEDICAL CARE IF:  The person  with diarrhea is a child, elderly person, or has a weakened immune system and has:  Signs of dehydration.   Diarrhea for more than 1 day.   Severe pain in the abdomen or rectum.   An oral temperature above 102 F (38.9 C), not controlled by medicine.   Stools containing blood or pus.   Stools that are black and tarry.  Document Released: 04/10/2003 Document Revised: 01/07/2011 Document Reviewed: 06/06/2009 [facility] Patient Information 2012 [place], [place].  Return for any new or worsening symptoms or any other concerns.

## 2011-05-03 NOTE — ED Provider Notes (Signed)
History     CSN: [id number]  Arrival date & time 05/03/11  2028   First MD Initiated Contact with Patient 05/03/11 2058      Chief Complaint  Patient presents with  . Diarrhea    (Consider location/radiation/quality/duration/timing/severity/associated sxs/prior treatment) HPI CC diarrhea onset about 2 weeks ago, moderate, no aggravating or alleviating factors.  Pt was admitted for CABG, received iv cefpodoxime while in hospital.  Diarrhea started a couple of days after d/c.  Pt denies pain, he specifically denies abd pain in contradiction to rn notes.  Stool is brown but foul smelling and has no blood.  Past Medical History  Diagnosis Date  . Diabetes mellitus   . Hypertension   . Chronic systolic heart failure     echo 04/12/11: Mild LVH, EF 30-35%, mid to distal anteroseptal and apical hypokinesis, grade 2 diastolic dysfunction, moderate LAE, mild RAE.  [name] Hyperlipidemia   . Tobacco abuse   . CAD (coronary artery disease)     acute anterior STEMI, late presentation 04/06/11 LHC 3/5 demonstrated severe ostial left main 95% stenosis and otherwise three-vessel CAD with an ejection fraction of 15%.  Emergent CABG: Dr. Dorris Fetch - Grafts: LIMA-LAD, SVG-D1, SVG-OM1, SVG-PDA and PL.  . Ischemic cardiomyopathy   . DM2 (diabetes mellitus, type 2)   . Cardioembolic stroke     post bypass 04/2011    Past Surgical History  Procedure Date  . Coronary artery bypass graft 04/06/2011    Procedure: CORONARY ARTERY BYPASS GRAFTING (CABG);  Surgeon: Loreli Slot, MD;  Location: [facility] OR;  Service: Open Heart Surgery;  Laterality: N/A;  Coronary Artery Bypass Graft times four utilizing the left internal mammary artery and the Right and left  greater Saphenous veins Harvested endoscopically.  . Fibroid bypass 04/06/11     Family History  Problem Relation Age of Onset  . Heart attack Mother     ?72s  . Heart attack Sister     46s    History  Substance Use Topics  . Smoking status: Former  Smoker -- 0.5 packs/day    Types: Cigarettes  . Smokeless tobacco: Not on file  . Alcohol Use: No      Review of Systems  Constitutional: Negative for fever and chills.  Gastrointestinal: Positive for diarrhea. Negative for nausea, vomiting, abdominal pain, blood in stool, abdominal distention and anal bleeding.  All other systems reviewed and are negative.    Allergies  Cardizem and Nifedipine  Home Medications   Current Outpatient Rx  Name Route Sig Dispense Refill  . ASPIRIN 325 MG PO TBEC Oral Take 325 mg by mouth daily.    . ATORVASTATIN CALCIUM 80 MG PO TABS Oral Take 80 mg by mouth daily at 6 PM.    . FUROSEMIDE 40 MG PO TABS Oral Take 40 mg by mouth daily. Hold per physician    . LINAGLIPTIN 5 MG PO TABS Oral Take 2 tablets (10 mg total) by mouth daily.    [name] METOPROLOL SUCCINATE ER 25 MG PO TB24 Oral Take 25 mg by mouth 2 (two) times daily.    [name] NITROGLYCERIN 0.4 MG SL SUBL Sublingual Place 0.4 mg under the tongue every 5 (five) minutes as needed. For chest pain    . OXYCODONE HCL 5 MG PO CAPS Oral Take 5 mg by mouth every 4 (four) hours as needed. For pain    . POTASSIUM CHLORIDE ER 10 MEQ PO TBCR Oral Take 10 mEq by mouth daily. Hold per physician    .  METRONIDAZOLE 500 MG PO TABS Oral Take 1 tablet (500 mg total) by mouth 3 (three) times daily. One po bid x 7 days 42 tablet 0    BP 116/59  Pulse 103  Temp(Src) 98.3 F (36.8 C) (Oral)  Resp 15  SpO2 99%ra wnl  Physical Exam  Nursing note and vitals reviewed. Constitutional: He appears well-developed and well-nourished.  HENT:  Head: Normocephalic and atraumatic.  Eyes: Right eye exhibits no discharge. Left eye exhibits no discharge.  Neck: Normal range of motion. Neck supple.  Cardiovascular: Regular rhythm and normal heart sounds.  Tachycardia present.   Pulmonary/Chest: Effort normal and breath sounds normal.  Abdominal: Soft. There is no tenderness.  Musculoskeletal: Normal range of motion. He exhibits  no tenderness.  Neurological: He is alert.  Skin: Skin is warm and dry.  Psychiatric: He has a normal mood and affect. His behavior is normal.    ED Course  Procedures (including critical care time)  Labs Reviewed  CBC - Abnormal; Notable for the following:    RBC 3.14 (*)    Hemoglobin 9.4 (*)    HCT 27.8 (*)    RDW 15.9 (*)    All other components within normal limits  COMPREHENSIVE METABOLIC PANEL - Abnormal; Notable for the following:    Potassium 5.2 (*)    Glucose, Bld 107 (*)    BUN 35 (*)    Albumin 3.1 (*)    Alkaline Phosphatase 231 (*)    GFR calc non Af Amer 63 (*)    GFR calc Af Amer 74 (*)    All other components within normal limits  URINALYSIS, ROUTINE W REFLEX MICROSCOPIC - Abnormal; Notable for the following:    Color, Urine AMBER (*) BIOCHEMICALS MAY BE AFFECTED BY COLOR   Bilirubin Urine SMALL (*)    Protein, ur 100 (*)    All other components within normal limits  URINE MICROSCOPIC-ADD ON - Abnormal; Notable for the following:    Casts HYALINE CASTS (*)    All other components within normal limits  DIFFERENTIAL  PROTIME-INR  POCT I-STAT TROPONIN I  CLOSTRIDIUM DIFFICILE BY PCR   No results found.   1. Diarrhea       MDM  Pt is in nad, afvss, nontoxic appearing, exam and hx as above.  C/f c diff, getting pcr.  Pt taking po, no n/v, no abd pain, abd benign, no fever.  Getting basic labs, pt getting ivf's.  No significant electrolyte abnormalities, pt walking in room without difficulty, orthostatics wnl, will d/c with flagyl for presumed cdiff and have pt see his pcp       Elijio Miles, MD 05/03/11 2349

## 2011-05-03 NOTE — ED Notes (Signed)
Report given to [facility]. 

## 2011-05-03 NOTE — ED Notes (Signed)
[facility] notified of pt transport to ED.

## 2011-05-03 NOTE — ED Notes (Signed)
Patient denies pain and is resting comfortably.  

## 2011-05-03 NOTE — Telephone Encounter (Signed)
C/o continued diarrhea despite lasix/k+ stopped and restarted. Diarrhea is wiping him out. Continued for one week now. Spoke with dr Elease Hashimoto, pt needs to go to urgent care. Explained the possibility of contracting C-Diff and will need to be tested. Caregiver agreed to take him now and will call tomorrow with update.

## 2011-05-03 NOTE — ED Notes (Signed)
Patient is resting comfortably. 

## 2011-05-03 NOTE — ED Notes (Signed)
Pt to ED via [facility] from [facility], request to r/o C-diff after 5 vessel cabbage on 04/06/11 performed at our facility, pt d/c on 04/16/2011, developed diarrhea on 04/18/2011, pt c/o generalized abd pain 4/10, denies fever or N/V, 20g LFA

## 2011-05-03 NOTE — ED Notes (Signed)
Family at bedside. 

## 2011-05-03 NOTE — ED Notes (Signed)
Pt given sprite 

## 2011-05-03 NOTE — Telephone Encounter (Signed)
Message copied by Antony Odea on Mon May 03, 2011  5:01 PM ------      Message from: [name]      Created: Thu Apr 29, 2011  5:56 PM       Labs are ok

## 2011-05-04 ENCOUNTER — Telehealth: Payer: Self-pay | Admitting: *Deleted

## 2011-05-04 NOTE — ED Provider Notes (Signed)
I saw and evaluated the patient, reviewed the resident's note and I agree with the findings and plan.  Diarrhea x 2-3 weeks.  S/p CABG March 5.  No nausea, vomiting, abdominal pain.  No fever.  Abdomen soft and nontender.    Glynn Octave, MD 05/04/11 0001

## 2011-05-04 NOTE — Telephone Encounter (Signed)
PT WAS STARTED ON ANTIBIOTIC FOR C DIFF, NO DIARRHEA TODAY.

## 2011-05-05 ENCOUNTER — Inpatient Hospital Stay (HOSPITAL_COMMUNITY)
Admission: EM | Admit: 2011-05-05 | Discharge: 2011-05-07 | DRG: 292 | Disposition: A | Discharge status: Home / Self Care | Payer: 59 | Source: Ambulatory Visit | Attending: Cardiology | Admitting: Cardiology

## 2011-05-05 ENCOUNTER — Other Ambulatory Visit: Payer: Self-pay

## 2011-05-05 ENCOUNTER — Encounter (HOSPITAL_COMMUNITY): Payer: Self-pay | Admitting: Emergency Medicine

## 2011-05-05 ENCOUNTER — Emergency Department (HOSPITAL_COMMUNITY): Payer: 59

## 2011-05-05 ENCOUNTER — Telehealth: Payer: Self-pay | Admitting: Physician Assistant

## 2011-05-05 DIAGNOSIS — N289 Disorder of kidney and ureter, unspecified: Secondary | ICD-10-CM | POA: Diagnosis present

## 2011-05-05 DIAGNOSIS — I5021 Acute systolic (congestive) heart failure: Secondary | ICD-10-CM

## 2011-05-05 DIAGNOSIS — E785 Hyperlipidemia, unspecified: Secondary | ICD-10-CM | POA: Diagnosis present

## 2011-05-05 DIAGNOSIS — I5023 Acute on chronic systolic (congestive) heart failure: Principal | ICD-10-CM | POA: Diagnosis present

## 2011-05-05 DIAGNOSIS — F17200 Nicotine dependence, unspecified, uncomplicated: Secondary | ICD-10-CM | POA: Diagnosis present

## 2011-05-05 DIAGNOSIS — A0472 Enterocolitis due to Clostridium difficile, not specified as recurrent: Secondary | ICD-10-CM | POA: Diagnosis present

## 2011-05-05 DIAGNOSIS — R0602 Shortness of breath: Secondary | ICD-10-CM

## 2011-05-05 DIAGNOSIS — Z8673 Personal history of transient ischemic attack (TIA), and cerebral infarction without residual deficits: Secondary | ICD-10-CM

## 2011-05-05 DIAGNOSIS — I2510 Atherosclerotic heart disease of native coronary artery without angina pectoris: Secondary | ICD-10-CM | POA: Diagnosis present

## 2011-05-05 DIAGNOSIS — I509 Heart failure, unspecified: Secondary | ICD-10-CM | POA: Diagnosis present

## 2011-05-05 DIAGNOSIS — E119 Type 2 diabetes mellitus without complications: Secondary | ICD-10-CM | POA: Diagnosis present

## 2011-05-05 DIAGNOSIS — D649 Anemia, unspecified: Secondary | ICD-10-CM | POA: Diagnosis present

## 2011-05-05 DIAGNOSIS — I10 Essential (primary) hypertension: Secondary | ICD-10-CM | POA: Diagnosis present

## 2011-05-05 DIAGNOSIS — Z951 Presence of aortocoronary bypass graft: Secondary | ICD-10-CM

## 2011-05-05 HISTORY — DX: Anemia, unspecified: D64.9

## 2011-05-05 HISTORY — DX: Disorder of kidney and ureter, unspecified: N28.9

## 2011-05-05 LAB — CBC
HCT: 25.5 % — ABNORMAL LOW (ref 39.0–52.0)
MCH: 29.9 pg (ref 26.0–34.0)
MCV: 87.6 fL (ref 78.0–100.0)
RBC: 2.91 MIL/uL — ABNORMAL LOW (ref 4.22–5.81)
WBC: 7.9 10*3/uL (ref 4.0–10.5)

## 2011-05-05 LAB — DIFFERENTIAL
Eosinophils Absolute: 0.2 10*3/uL (ref 0.0–0.7)
Eosinophils Relative: 2 % (ref 0–5)
Lymphocytes Relative: 21 % (ref 12–46)
Lymphs Abs: 1.6 10*3/uL (ref 0.7–4.0)
Monocytes Absolute: 0.6 10*3/uL (ref 0.1–1.0)
Monocytes Relative: 8 % (ref 3–12)

## 2011-05-05 LAB — BASIC METABOLIC PANEL
CO2: 21 mEq/L (ref 19–32)
Calcium: 8.8 mg/dL (ref 8.4–10.5)
Chloride: 104 mEq/L (ref 96–112)
Glucose, Bld: 131 mg/dL — ABNORMAL HIGH (ref 70–99)
Sodium: 136 mEq/L (ref 135–145)

## 2011-05-05 LAB — POCT I-STAT TROPONIN I: Troponin i, poc: 0.05 ng/mL (ref 0.00–0.08)

## 2011-05-05 MED ORDER — FUROSEMIDE 10 MG/ML IJ SOLN
40.0000 mg | Freq: Once | INTRAMUSCULAR | Status: AC
  Administered 2011-05-05: 40 mg via INTRAVENOUS
  Filled 2011-05-05: qty 4

## 2011-05-05 NOTE — ED Notes (Signed)
Patient transported to X-ray 

## 2011-05-05 NOTE — ED Provider Notes (Signed)
History     CSN: [id number]  Arrival date & time 05/05/11  1941   None     Chief Complaint  Patient presents with  . Shortness of Breath    (Consider location/radiation/quality/duration/timing/severity/associated sxs/prior treatment) HPI CC sob and intermittent SS chest pressure onset yesterday.  Progressively worsening, worse with exertion, relieved with lasix and supplemental O2.  Sx's moderate.  Past Medical History  Diagnosis Date  . Diabetes mellitus   . Hypertension   . Chronic systolic heart failure     echo 04/12/11: Mild LVH, EF 30-35%, mid to distal anteroseptal and apical hypokinesis, grade 2 diastolic dysfunction, moderate LAE, mild RAE.  [name] Hyperlipidemia   . Tobacco abuse   . CAD (coronary artery disease)     acute anterior STEMI, late presentation 04/06/11 LHC 3/5 demonstrated severe ostial left main 95% stenosis and otherwise three-vessel CAD with an ejection fraction of 15%.  Emergent CABG: Dr. Dorris Fetch - Grafts: LIMA-LAD, SVG-D1, SVG-OM1, SVG-PDA and PL.  . Ischemic cardiomyopathy   . DM2 (diabetes mellitus, type 2)   . Cardioembolic stroke     post bypass 04/2011    Past Surgical History  Procedure Date  . Coronary artery bypass graft 04/06/2011    Procedure: CORONARY ARTERY BYPASS GRAFTING (CABG);  Surgeon: Loreli Slot, MD;  Location: [facility] OR;  Service: Open Heart Surgery;  Laterality: N/A;  Coronary Artery Bypass Graft times four utilizing the left internal mammary artery and the Right and left  greater Saphenous veins Harvested endoscopically.  . Fibroid bypass 04/06/11     Family History  Problem Relation Age of Onset  . Heart attack Mother     ?5s  . Heart attack Sister     50s    History  Substance Use Topics  . Smoking status: Former Smoker -- 0.5 packs/day    Types: Cigarettes  . Smokeless tobacco: Not on file  . Alcohol Use: No      Review of Systems  Constitutional: Negative for fever and chills.  Respiratory: Positive for  cough (mild, nonproductive) and shortness of breath. Negative for chest tightness, wheezing and stridor.   Cardiovascular: Positive for chest pain (once early this am) and leg swelling (mild, bilat and symmetric). Negative for palpitations.  Gastrointestinal: Negative for nausea, vomiting, abdominal pain and diarrhea.  Musculoskeletal: Negative for back pain.  Neurological: Negative for light-headedness.  All other systems reviewed and are negative.    Allergies  Cardizem and Nifedipine  Home Medications   Current Outpatient Rx  Name Route Sig Dispense Refill  . ASPIRIN 325 MG PO TBEC Oral Take 325 mg by mouth daily.    . ATORVASTATIN CALCIUM 80 MG PO TABS Oral Take 80 mg by mouth daily at 6 PM.    . FUROSEMIDE 40 MG PO TABS Oral Take 40 mg by mouth daily.     [name] LINAGLIPTIN 5 MG PO TABS Oral Take 2 tablets (10 mg total) by mouth daily.    [name] METOPROLOL SUCCINATE ER 25 MG PO TB24 Oral Take 25 mg by mouth daily.     [name] METRONIDAZOLE 500 MG PO TABS Oral Take 500 mg by mouth 3 (three) times daily. One po bid x 7 days    . NITROGLYCERIN 0.4 MG SL SUBL Sublingual Place 0.4 mg under the tongue every 5 (five) minutes as needed. For chest pain    . OXYCODONE HCL 5 MG PO CAPS Oral Take 5 mg by mouth every 4 (four) hours as needed. For pain    .  POTASSIUM CHLORIDE ER 10 MEQ PO TBCR Oral Take 10 mEq by mouth daily. Hold per physician      BP 115/69  Pulse 100  Temp(Src) 97.4 F (36.3 C) (Oral)  Resp 16  SpO2 100%  Physical Exam  Nursing note and vitals reviewed. Constitutional: He appears well-developed and well-nourished.  HENT:  Head: Normocephalic and atraumatic.  Eyes: Right eye exhibits no discharge. Left eye exhibits no discharge.  Neck: Normal range of motion. Neck supple.  Cardiovascular: Normal rate, regular rhythm and normal heart sounds.  Frequent extrasystoles are present.  Pulmonary/Chest: Effort normal and breath sounds normal. No respiratory distress. He has no wheezes. He  has no rales. He exhibits no tenderness.  Abdominal: Soft. There is no tenderness.  Musculoskeletal: Normal range of motion. He exhibits no tenderness.  Neurological: He is alert.  Skin: Skin is warm and dry.  Psychiatric: He has a normal mood and affect. His behavior is normal.    ED Course  Procedures (including critical care time)  Labs Reviewed  BASIC METABOLIC PANEL - Abnormal; Notable for the following:    Glucose, Bld 131 (*)    BUN 35 (*)    GFR calc non Af Amer 62 (*)    GFR calc Af Amer 72 (*)    All other components within normal limits  CBC - Abnormal; Notable for the following:    RBC 2.91 (*)    Hemoglobin 8.7 (*)    HCT 25.5 (*)    RDW 15.6 (*)    All other components within normal limits  PRO B NATRIURETIC PEPTIDE - Abnormal; Notable for the following:    Pro B Natriuretic peptide (BNP) 22388.0 (*)    All other components within normal limits  DIFFERENTIAL  POCT I-STAT TROPONIN I   Dg Chest 2 View  05/05/2011  *RADIOLOGY REPORT*  Clinical Data: 66 year old male with shortness of breath, cough. Recent coronary artery bypass.  CHEST - 2 VIEW  Comparison: 04/14/2011 and earlier.  Findings: Lower lung volumes.  Increased bilateral pleural effusions, still small to moderate.  Worsening bibasilar opacity. Sequelae of CABG.  Stable cardiac size and mediastinal contours. Median sternotomy wires are stable.  No pneumothorax.  ACDF hardware.  IMPRESSION: Lower lung volumes with increased pleural effusions and decreased bibasilar ventilation.  Original Report Authenticated By: Ulla Potash III, M.D.     1. CHF (congestive heart failure)   2. Shortness of breath       MDM  Ekg shows no acute ischemia, unchanged from previous. No electrical alternans.  Pt took full ASA today.  Hx of bilat pedal edema, sob, improved with lasix c/w chf exacerbation.  Checking bnp, trop.    CXR shows no opacities, wbc nl, no fever doubt pna.  bnp elevated, Dr Terressa Koyanagi consulted with Corinda Gubler,  will see pt in the ED.  Cardiology to admit, pt stable    Elijio Miles, MD 05/05/11 2325

## 2011-05-05 NOTE — Telephone Encounter (Signed)
Kurt Richard son called because he was having problems with SOB. He has been having problems all day and the son was concerned.   Advised him the best option would be to call 911 and have his dad transported to the ER by EMS. Kurt Richard stated he would do so.

## 2011-05-05 NOTE — ED Notes (Signed)
Pt to ED via EMS with c/o SOB since last PM

## 2011-05-05 NOTE — H&P (Signed)
Cardiology History and Physical  DEFAULT,PROVIDER, MD, MD  History of Present Illness (and review of medical records): LANSING SIGMON is a 66 y.o. male who presents for evaluation of shortness of breath.  Of note patient was recently discharged after suffering late presenting, acute anterior MI in March with cath revealing severe LMCA and 3vd dz requring emergent CABG.  He underwent 5v CABG.  His course was complicated by severely reduced LV function requiring IABP and inotropic support, acute blood loss anemia, and acute CVA.  He was discharged with home rehab.  He had been doing well other than having diarrhea for two weeks.  He came in to ED on Monday and was started on flagyl for presumed cdiff.  He developed shortness of breath Tuesday night with difficulty sleeping.  He had to sit up in chair most of the night.  This got progressively worse today.  He felt some relief after home dose of lasix, however, symptoms returned soon thereafter.    Weight has been stable, however, with mild swelling in legs.  He denies any chest pain.  Review of Systems Further review of systems was otherwise negative other than stated in HPI.  Patient Active Problem List  Diagnoses Date Noted  . Acute CHF (congestive heart failure) 05/05/2011  . CAD (coronary artery disease) 04/23/2011  . Ischemic cardiomyopathy 04/23/2011  . Chronic systolic heart failure 04/23/2011  . Cardioembolic stroke 04/23/2011  . STEMI (ST elevation myocardial infarction) 04/06/2011  . Diabetes mellitus 04/06/2011  . Tobacco abuse   . Hyperlipidemia   . Hypertension    Past Medical History  Diagnosis Date  . Diabetes mellitus   . Hypertension   . Chronic systolic heart failure     echo 04/12/11: Mild LVH, EF 30-35%, mid to distal anteroseptal and apical hypokinesis, grade 2 diastolic dysfunction, moderate LAE, mild RAE.  [name] Hyperlipidemia   . Tobacco abuse   . CAD (coronary artery disease)     acute anterior STEMI, late  presentation 04/06/11 LHC 3/5 demonstrated severe ostial left main 95% stenosis and otherwise three-vessel CAD with an ejection fraction of 15%.  Emergent CABG: Dr. Dorris Fetch - Grafts: LIMA-LAD, SVG-D1, SVG-OM1, SVG-PDA and PL.  . Ischemic cardiomyopathy   . DM2 (diabetes mellitus, type 2)   . Cardioembolic stroke     post bypass 04/2011    Past Surgical History  Procedure Date  . Coronary artery bypass graft 04/06/2011    Procedure: CORONARY ARTERY BYPASS GRAFTING (CABG);  Surgeon: Loreli Slot, MD;  Location: [facility] OR;  Service: Open Heart Surgery;  Laterality: N/A;  Coronary Artery Bypass Graft times four utilizing the left internal mammary artery and the Right and left  greater Saphenous veins Harvested endoscopically.  . Fibroid bypass 04/06/11     Medications Prior to Admission  Medication Dose Route Frequency Provider Last Rate Last Dose  . furosemide (LASIX) injection 40 mg  40 mg Intravenous Once Elijio Miles, MD   40 mg at 05/05/11 2252   Medications Prior to Admission  Medication Sig Dispense Refill  . aspirin 325 MG EC tablet Take 325 mg by mouth daily.      [name] atorvastatin (LIPITOR) 80 MG tablet Take 80 mg by mouth daily at 6 PM.      . furosemide (LASIX) 40 MG tablet Take 40 mg by mouth daily.       [name] linagliptin (TRADJENTA) 5 MG TABS tablet Take 2 tablets (10 mg total) by mouth daily.      [name]  metoprolol succinate (TOPROL-XL) 25 MG 24 hr tablet Take 25 mg by mouth daily.       . metroNIDAZOLE (FLAGYL) 500 MG tablet Take 500 mg by mouth 3 (three) times daily. One po bid x 7 days      . nitroGLYCERIN (NITROSTAT) 0.4 MG SL tablet Place 0.4 mg under the tongue every 5 (five) minutes as needed. For chest pain      . oxycodone (OXY-IR) 5 MG capsule Take 5 mg by mouth every 4 (four) hours as needed. For pain      . potassium chloride (K-DUR) 10 MEQ tablet Take 10 mEq by mouth daily. Hold per physician        Allergies  Allergen Reactions  . Cardizem Hives  . Nifedipine Swelling     History  Substance Use Topics  . Smoking status: Former Smoker -- 0.5 packs/day    Types: Cigarettes  . Smokeless tobacco: Not on file  . Alcohol Use: No    Family History  Problem Relation Age of Onset  . Heart attack Mother     ?84s  . Heart attack Sister     43s     Objective: Patient Vitals for the past 8 hrs:  BP Temp Temp src Pulse Resp SpO2  05/05/11 2058 - - - - - 100 %  05/05/11 1944 115/69 mmHg 97.4 F (36.3 C) Oral 100  16  99 %   General Appearance:    Alert, cooperative, no distress, appears stated age  Head:    Normocephalic, without obvious abnormality, atraumatic  Eyes:     PERRL, EOMI, anicteric sclerae  Neck:   Supple, no carotid bruit or JVD  Lungs:     Clear to auscultation bilaterally, respirations unlabored  Heart:    Regular rate and rhythm, S1 and S2 normal, no murmur  Abdomen:     Soft, non-tender, normoactive bowel sounds  Extremities:   1+ BLE edema  Pulses:   2+ and symmetric all extremities  Skin:   no rashes or lesions  Neurologic:   No focal deficits. AAO x3   Results for orders placed during the hospital encounter of 05/05/11 (from the past 48 hour(s))  BASIC METABOLIC PANEL     Status: Abnormal   Collection Time   05/05/11  8:22 PM      Component Value Range Comment   Sodium 136  135 - 145 (mEq/L)    Potassium 4.6  3.5 - 5.1 (mEq/L)    Chloride 104  96 - 112 (mEq/L)    CO2 21  19 - 32 (mEq/L)    Glucose, Bld 131 (*) 70 - 99 (mg/dL)    BUN 35 (*) 6 - 23 (mg/dL)    Creatinine, Ser 1.61  0.50 - 1.35 (mg/dL)    Calcium 8.8  8.4 - 10.5 (mg/dL)    GFR calc non Af Amer 62 (*) >90 (mL/min)    GFR calc Af Amer 72 (*) >90 (mL/min)   CBC     Status: Abnormal   Collection Time   05/05/11  8:22 PM      Component Value Range Comment   WBC 7.9  4.0 - 10.5 (K/uL)    RBC 2.91 (*) 4.22 - 5.81 (MIL/uL)    Hemoglobin 8.7 (*) 13.0 - 17.0 (g/dL)    HCT 09.6 (*) 04.5 - 52.0 (%)    MCV 87.6  78.0 - 100.0 (fL)    MCH 29.9  26.0 - 34.0 (pg)    MCHC  34.1   30.0 - 36.0 (g/dL)    RDW 16.1 (*) 09.6 - 15.5 (%)    Platelets 341  150 - 400 (K/uL)   DIFFERENTIAL     Status: Normal   Collection Time   05/05/11  8:22 PM      Component Value Range Comment   Neutrophils Relative 69  43 - 77 (%)    Neutro Abs 5.4  1.7 - 7.7 (K/uL)    Lymphocytes Relative 21  12 - 46 (%)    Lymphs Abs 1.6  0.7 - 4.0 (K/uL)    Monocytes Relative 8  3 - 12 (%)    Monocytes Absolute 0.6  0.1 - 1.0 (K/uL)    Eosinophils Relative 2  0 - 5 (%)    Eosinophils Absolute 0.2  0.0 - 0.7 (K/uL)    Basophils Relative 1  0 - 1 (%)    Basophils Absolute 0.0  0.0 - 0.1 (K/uL)   PRO B NATRIURETIC PEPTIDE     Status: Abnormal   Collection Time   05/05/11  8:22 PM      Component Value Range Comment   Pro B Natriuretic peptide (BNP) 22388.0 (*) 0 - 125 (pg/mL)   POCT I-STAT TROPONIN I     Status: Normal   Collection Time   05/05/11 11:11 PM      Component Value Range Comment   Troponin i, poc 0.05  0.00 - 0.08 (ng/mL)    Comment 3             Dg Chest 2 View  05/05/2011  *RADIOLOGY REPORT*  Clinical Data: 66 year old male with shortness of breath, cough. Recent coronary artery bypass.  CHEST - 2 VIEW  Comparison: 04/14/2011 and earlier.  Findings: Lower lung volumes.  Increased bilateral pleural effusions, still small to moderate.  Worsening bibasilar opacity. Sequelae of CABG.  Stable cardiac size and mediastinal contours. Median sternotomy wires are stable.  No pneumothorax.  ACDF hardware.  IMPRESSION: Lower lung volumes with increased pleural effusions and decreased bibasilar ventilation.  Original Report Authenticated By: Ulla Potash III, M.D.    ECG:  Sinus HR 100, FABV, LAD, prior anterior septal infarct, TWI 1, avl  Assessment: Acute systolic HF CAD s/p CABG Anemia HTN HLD DM Recent CVA  Plan:  1. Admit to [facility], Telemetry Unit 2. Repeat ekg on admit, prn chest pain or arrythmia 3. Given IV Lasix 40 in ED, monitor response continue BID 4. Strict I/Os, daily  weights, 1.5 liter fluid restriction, 2 gm Na diet 5. Cont BB, Add ACEi if BP can tolerate 6. Cont ASA, Statin, Nitro prn 7. SSI, home oral meds 8. Flagyl for diarrhea 9. PT/OT for deconditioning

## 2011-05-06 ENCOUNTER — Encounter (HOSPITAL_COMMUNITY): Payer: Self-pay | Admitting: General Practice

## 2011-05-06 ENCOUNTER — Other Ambulatory Visit: Payer: Self-pay

## 2011-05-06 ENCOUNTER — Encounter: Payer: Self-pay | Admitting: Thoracic Surgery (Cardiothoracic Vascular Surgery)

## 2011-05-06 DIAGNOSIS — I5023 Acute on chronic systolic (congestive) heart failure: Secondary | ICD-10-CM

## 2011-05-06 LAB — BASIC METABOLIC PANEL
BUN: 35 mg/dL — ABNORMAL HIGH (ref 6–23)
Calcium: 9.1 mg/dL (ref 8.4–10.5)
Chloride: 108 mEq/L (ref 96–112)
Creatinine, Ser: 1.34 mg/dL (ref 0.50–1.35)
GFR calc Af Amer: 63 mL/min — ABNORMAL LOW (ref >90)
GFR calc non Af Amer: 54 mL/min — ABNORMAL LOW (ref >90)

## 2011-05-06 LAB — CBC
HCT: 26 % — ABNORMAL LOW (ref 39.0–52.0)
MCHC: 33.5 g/dL (ref 30.0–36.0)
Platelets: 332 10*3/uL (ref 150–400)
RDW: 15.8 % — ABNORMAL HIGH (ref 11.5–15.5)
WBC: 8 10*3/uL (ref 4.0–10.5)

## 2011-05-06 LAB — DIFFERENTIAL
Basophils Absolute: 0.1 10*3/uL (ref 0.0–0.1)
Basophils Relative: 1 % (ref 0–1)
Lymphocytes Relative: 26 % (ref 12–46)
Monocytes Absolute: 0.6 10*3/uL (ref 0.1–1.0)
Neutro Abs: 4.9 10*3/uL (ref 1.7–7.7)

## 2011-05-06 LAB — PROTIME-INR: Prothrombin Time: 16.5 seconds — ABNORMAL HIGH (ref 11.6–15.2)

## 2011-05-06 LAB — GLUCOSE, CAPILLARY

## 2011-05-06 LAB — MAGNESIUM: Magnesium: 1.7 mg/dL (ref 1.5–2.5)

## 2011-05-06 MED ORDER — SODIUM CHLORIDE 0.9 % IV SOLN: 250.0000 mL | INTRAVENOUS | Status: DC | PRN

## 2011-05-06 MED ORDER — SODIUM CHLORIDE 0.9 % IJ SOLN
3.0000 mL | Freq: Two times a day (BID) | INTRAMUSCULAR | Status: DC
  Administered 2011-05-06 – 2011-05-07 (×4): 3 mL via INTRAVENOUS

## 2011-05-06 MED ORDER — METOPROLOL SUCCINATE ER 25 MG PO TB24
25.0000 mg | ORAL_TABLET | Freq: Every day | ORAL | Status: DC
  Administered 2011-05-06 – 2011-05-07 (×2): 25 mg via ORAL
  Filled 2011-05-06 (×2): qty 1

## 2011-05-06 MED ORDER — LINAGLIPTIN 5 MG PO TABS
10.0000 mg | ORAL_TABLET | Freq: Every day | ORAL | Status: DC
  Administered 2011-05-06 – 2011-05-07 (×2): 10 mg via ORAL
  Filled 2011-05-06 (×2): qty 2

## 2011-05-06 MED ORDER — ONDANSETRON HCL 4 MG/2ML IJ SOLN: 4.0000 mg | Freq: Four times a day (QID) | INTRAMUSCULAR | Status: DC | PRN

## 2011-05-06 MED ORDER — OXYCODONE HCL 5 MG PO CAPS: 5.0000 mg | ORAL_CAPSULE | ORAL | Status: DC | PRN

## 2011-05-06 MED ORDER — ASPIRIN EC 325 MG PO TBEC
325.0000 mg | DELAYED_RELEASE_TABLET | Freq: Every day | ORAL | Status: DC
  Administered 2011-05-06 – 2011-05-07 (×2): 325 mg via ORAL
  Filled 2011-05-06 (×2): qty 1

## 2011-05-06 MED ORDER — METRONIDAZOLE 500 MG PO TABS
500.0000 mg | ORAL_TABLET | Freq: Three times a day (TID) | ORAL | Status: DC
  Administered 2011-05-06 – 2011-05-07 (×4): 500 mg via ORAL
  Filled 2011-05-06 (×6): qty 1

## 2011-05-06 MED ORDER — SODIUM CHLORIDE 0.9 % IJ SOLN: 3.0000 mL | INTRAMUSCULAR | Status: DC | PRN

## 2011-05-06 MED ORDER — ATORVASTATIN CALCIUM 80 MG PO TABS
80.0000 mg | ORAL_TABLET | Freq: Every day | ORAL | Status: DC
  Administered 2011-05-06: 80 mg via ORAL
  Filled 2011-05-06 (×2): qty 1

## 2011-05-06 MED ORDER — ACETAMINOPHEN 325 MG PO TABS: 650.0000 mg | ORAL_TABLET | ORAL | Status: DC | PRN

## 2011-05-06 MED ORDER — OXYCODONE HCL 5 MG PO TABS
5.0000 mg | ORAL_TABLET | ORAL | Status: DC | PRN
  Administered 2011-05-06: 5 mg via ORAL
  Filled 2011-05-06: qty 1

## 2011-05-06 MED ORDER — INSULIN ASPART 100 UNIT/ML ~~LOC~~ SOLN
0.0000 [IU] | Freq: Three times a day (TID) | SUBCUTANEOUS | Status: DC
  Administered 2011-05-06: 3 [IU] via SUBCUTANEOUS

## 2011-05-06 MED ORDER — FUROSEMIDE 10 MG/ML IJ SOLN
40.0000 mg | Freq: Two times a day (BID) | INTRAMUSCULAR | Status: DC
  Administered 2011-05-06 – 2011-05-07 (×3): 40 mg via INTRAVENOUS
  Filled 2011-05-06 (×5): qty 4

## 2011-05-06 MED ORDER — HEPARIN SODIUM (PORCINE) 5000 UNIT/ML IJ SOLN
5000.0000 [IU] | Freq: Three times a day (TID) | INTRAMUSCULAR | Status: DC
  Administered 2011-05-06 (×3): 5000 [IU] via SUBCUTANEOUS
  Filled 2011-05-06 (×8): qty 1

## 2011-05-06 MED ORDER — NITROGLYCERIN 0.4 MG SL SUBL: 0.4000 mg | SUBLINGUAL_TABLET | SUBLINGUAL | Status: DC | PRN

## 2011-05-06 NOTE — Progress Notes (Signed)
05/06/11 1500 UR Completed. Tera Mater, RN, BSN Nurse Case Manager [phone]

## 2011-05-06 NOTE — Progress Notes (Signed)
Admitted pt to rm [id number] from ED via stretcher, alert and oriented, denied pain at this time. Oriented to room, call bell placed within reach, SR on heart monitor. Admission assessment done. Heart Failure Education initiated. Will continue to monitor.   Filed Vitals:   05/06/11 0115  BP: 123/79  Pulse: 102  Temp: 98.6 F (37 C)  Resp: [address], [address]

## 2011-05-06 NOTE — ED Provider Notes (Signed)
I saw and evaluated the patient, reviewed the resident's note and I agree with the findings and plan.  CHF exacerbation. Admit to cards  Loren Racer, MD 05/06/11 0030

## 2011-05-06 NOTE — Progress Notes (Signed)
Cardiology Progress Note Patient Name: Kurt Richard Date of Encounter: 05/06/2011, 10:08 AM     Subjective  No overnight events. Patient reports his breathing is better this morning. He reported that his Lasix was stopped last week as he was having diarrhea. He started taking Flagyl on Monday and his last episode of diarrhea was on Monday. He apparently did not restart his Lasix.   Objective   Telemetry: Sinus rhythm, borderline 1st degree AV block, 90s-110s  Medications: . aspirin EC  325 mg Oral Daily  . atorvastatin  80 mg Oral q1800  . furosemide  40 mg Intravenous Once  . furosemide  40 mg Intravenous BID  . heparin  5,000 Units Subcutaneous Q8H  . insulin aspart  0-15 Units Subcutaneous TID WC  . linagliptin  10 mg Oral Daily  . metoprolol succinate  25 mg Oral Daily  . metroNIDAZOLE  500 mg Oral TID  . sodium chloride  3 mL Intravenous Q12H    Physical Exam: Temp:  [97.4 F (36.3 C)-98.6 F (37 C)] 98.4 F (36.9 C) (04/04 0504) Pulse Rate:  [97-102] 99  (04/04 0504) Resp:  [16-20] 18  (04/04 0504) BP: (97-123)/(50-79) 97/50 mmHg (04/04 0504) SpO2:  [96 %-100 %] 96 % (04/04 0504) Weight:  [157 lb 3 oz (71.3 kg)] 157 lb 3 oz (71.3 kg) (04/04 0115)  General: Pleasant elderly male, in no acute distress. Head: Normocephalic, atraumatic, sclera non-icteric, nares are without discharge.  Neck: Supple. Negative for carotid bruits or JVD Lungs: Bibasilar rales to mid lung fields. No wheezes or rhonchi. Breathing is unlabored. Heart: RRR S1 S2 without murmurs, rubs, or gallops.  Abdomen: Soft, non-tender, non-distended with normoactive bowel sounds. No rebound/guarding. No obvious abdominal masses. Msk:  Strength and tone appear normal for age. Extremities: 1+ BLE edema to knees, R>L. No clubbing or cyanosis. Distal pedal pulses are intact and equal bilaterally. Neuro: Alert and oriented X 3. Moves all extremities spontaneously. Psych:  Responds to questions  appropriately with a normal affect.   Intake/Output Summary (Last 24 hours) at 05/06/11 1008 Last data filed at 05/06/11 0300  Gross per 24 hour  Intake    240 ml  Output    550 ml  Net   -310 ml    Labs:  [facility] 05/06/11 0519 05/05/11 2022  NA 140 136  K 4.8 4.6  CL 108 104  CO2 24 21  GLUCOSE 113* 131*  BUN 35* 35*  CREATININE 1.34 1.20  CALCIUM 9.1 8.8  MG 1.7 --   Basename 05/06/11 0519 05/05/11 2022  WBC 8.0 7.9  NEUTROABS 4.9 5.4  HGB 8.7* 8.7*  HCT 26.0* 25.5*  MCV 88.7 87.6  PLT 332 341     05/05/2011 20:22  Pro B Natriuretic peptide (BNP) 22388.0 (H)     05/05/2011 23:11  Troponin i, poc 0.05     05/06/2011 05:19  Prothrombin Time 16.5 (H)  INR 1.31    Radiology/Studies:   05/05/2011 - CXR Findings: Lower lung volumes.  Increased bilateral pleural effusions, still small to moderate.  Worsening bibasilar opacity. Sequelae of CABG.  Stable cardiac size and mediastinal contours. Median sternotomy wires are stable.  No pneumothorax.  ACDF hardware.  IMPRESSION: Lower lung volumes with increased pleural effusions and decreased bibasilar ventilation.      Assessment and Plan  66 y.o. male who was recently discharged after having an acute anterior MI s/p CABG 04/06/11 complicated by severely reduced LV function  requiring IABP & inotropic support, acute blood loss anemia, and acute cardioembolic CVA who presented to [facility] on 05/05/11 with complaints of dyspnea.  1. Acute on Chronic Systolic CHF: He had recent bout of diarrhea and was taken off lasix and placed on flagyl for cdiff. Now presents with dyspnea and lower extremity edema. CXR with increased pleural effusions. pBNP 22,388. WBC normal, afebrile. Given IV lasix. Diuresed - overnight. Wt 157lbs on admission (baseline wt 154, morning wt pending). BUN/Crt 35/1.34. Cont diuresis with close monitoring of renal function and electrolytes.  2. CAD: No anginal symptoms. poc troponin normal. No acute  ischemic EKG changes. Cont ASA, BB, statin.  3. C.Diff: Cont flagyl   Signed, HOPE, JESSICA PA-C  Attending Note:   The patient was seen and examined.  Agree with assessment and plan as noted above.  Ms. Halls is feeling much better today after some diuresis. I suspect that he became volume overloaded when his diarrhea improved and he did not restart his Lasix back.  We'll keep him today and will restart his home medications. I hope is that we can send him home tomorrow or Saturday.  There is no indication of recurrent ischemia. I don't think that we need to pursue that.  2. Diarrhea-I suspect that he had C. difficile. He's not had diarrhea in 3-4 days. We'll check with the nursing staff to see if he needs to be on contact isolation.  [name], [name]., MD, [facility] 05/06/2011, 10:49 AM

## 2011-05-07 ENCOUNTER — Encounter (HOSPITAL_COMMUNITY): Payer: Self-pay | Admitting: Physician Assistant

## 2011-05-07 DIAGNOSIS — I509 Heart failure, unspecified: Secondary | ICD-10-CM

## 2011-05-07 LAB — BASIC METABOLIC PANEL
BUN: 37 mg/dL — ABNORMAL HIGH (ref 6–23)
Chloride: 106 mEq/L (ref 96–112)
Creatinine, Ser: 1.54 mg/dL — ABNORMAL HIGH (ref 0.50–1.35)
GFR calc non Af Amer: 46 mL/min — ABNORMAL LOW (ref >90)
Glucose, Bld: 98 mg/dL (ref 70–99)
Potassium: 4.4 mEq/L (ref 3.5–5.1)

## 2011-05-07 MED ORDER — METRONIDAZOLE 500 MG PO TABS: 500.0000 mg | ORAL_TABLET | Freq: Three times a day (TID) | ORAL | Status: DC

## 2011-05-07 NOTE — Discharge Summary (Signed)
Discharge Summary   Patient ID: Kurt Richard MRN: [id number], DOB/AGE: 64-Jan-1947 64 y.o. Admit date: 05/05/2011 D/C date:     05/07/2011   Primary Discharge Diagnoses:  1. Acute on chronic systolic CHF 2. Recent diarrhea, on Flagyl for presumed C diff 3. Acute renal insufficiency, d/c Cr 1.54 4. CAD without evidence of ischemia this admission - recent late-presenting anterior MI 04/2011 with 3V dz s/p CABG - postop course 04/2011 complicated by LV dysfunction requiring IABP/inotropes, ABL anemia, CVA  Secondary Discharge Diagnoses:  1. 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 Diabetes mellitus 3. Tobacco abuse 4. HTN 5. HL  Hospital Course: 66 y/o M with hx of recent anterior MI s/p emergent CABG for LMCA/3V disease with hospital course complicated by LV dysfunction requiring IABP/inotropes, ABL anemia, and acute CVA. He was discharged with home rehab. He had been doing well but had diarrhea for C-diff. He came to the ER on Monday 4/1 and was started on Flagyl for presumed C-diff. He had also stopped Lasix some time last week. He developed SOB overnight with difficulty sleeping 4/2, and the following day returned to the ER with progressive dyspnea. No chest pain, had had mild swelling in legs. EKG showed sinus rhythm with HR 100, FABV, LAD, prior anterior septal infarct, TWI 1, avl, He was afebrile. He was thus admitted to the cardiology service and placed on IV Lasix for diuresis for concern for acute systolic CHF. [place] at [address]. POC troponin was negative. He diuresed well with IV Lasix and was down 5 lbs by day of discharge with d/c weight of 152 lbs & marked improvement in symptoms. Dr. Elease Hashimoto did not feel that PE was a concern. Initially Dr. Elease Hashimoto requested the patient be put back on his home Lasix and potassium, but his K levels have been stable while in the hospital 4.4-4.8 while on IV diuretics without potassium repletion. In light of his mild renal insufficiency, we will hold off  on daily potassium supplementation. He will have his BMET checked as an outpatient. The patient was seen and examined today and felt stable for discharge by Dr. Elease Hashimoto. Please note, he is also not currently not placed on ACEI at discharge because of borderline low blood pressures and a bump in his renal function. This may be addressed as an outpatient.  Discharge Vitals: Blood pressure 111/64, pulse 97, temperature 98 F (36.7 C), temperature source Oral, resp. rate 16, height 5\' 7"  (1.702 m), weight 152 lb 12.5 oz (69.3 kg), SpO2 93.00%.  Labs: Lab Results  Component Value Date   WBC 8.0 05/06/2011   HGB 8.7* 05/06/2011   HCT 26.0* 05/06/2011   MCV 88.7 05/06/2011   PLT 332 05/06/2011     Lab 05/07/11 0630 05/03/11 2108  NA 140 --  K 4.4 --  CL 106 --  CO2 23 --  BUN 37* --  CREATININE 1.54* --  CALCIUM 9.1 --  PROT -- 7.2  BILITOT -- 0.6  ALKPHOS -- 231*  ALT -- 17  AST -- 20  GLUCOSE 98 --    Lab Results  Component Value Date   CHOL 120 04/09/2011   HDL 15* 04/09/2011   LDLCALC 82 04/09/2011   TRIG 115 04/09/2011    Diagnostic Studies/Procedures   1. Chest 2 View 05/05/2011  *RADIOLOGY REPORT*  Clinical Data: 66 year old male with shortness of breath, cough. Recent coronary artery bypass.  CHEST - 2 VIEW  Comparison: 04/14/2011 and earlier.  Findings: Lower lung volumes.  Increased bilateral pleural effusions, still small to moderate.  Worsening bibasilar opacity. Sequelae of CABG.  Stable cardiac size and mediastinal contours. Median sternotomy wires are stable.  No pneumothorax.  ACDF hardware.  IMPRESSION: Lower lung volumes with increased pleural effusions and decreased bibasilar ventilation.  Original Report Authenticated By: Harley Hallmark, M.D.   Discharge Medications   Medication List  As of 05/07/2011 10:23 AM   STOP taking these medications         potassium chloride 10 MEQ tablet         TAKE these medications         aspirin 325 MG EC tablet   Take 325 mg by mouth  daily.      atorvastatin 80 MG tablet   Commonly known as: LIPITOR   Take 80 mg by mouth daily at 6 PM.      furosemide 40 MG tablet   Commonly known as: LASIX   Take 40 mg by mouth daily.      linagliptin 5 MG Tabs tablet   Commonly known as: TRADJENTA   Take 2 tablets (10 mg total) by mouth daily.      metoprolol succinate 25 MG 24 hr tablet   Commonly known as: TOPROL-XL   Take 25 mg by mouth daily.      metroNIDAZOLE 500 MG tablet   Commonly known as: FLAGYL   Take 1 tablet (500 mg total) by mouth 3 (three) times daily. You should be taking 1 tablet three times daily for 7 days total from the day you started.      nitroGLYCERIN 0.4 MG SL tablet   Commonly known as: NITROSTAT   Place 0.4 mg under the tongue every 5 (five) minutes as needed. For chest pain      oxycodone 5 MG capsule   Commonly known as: OXY-IR   Take 5 mg by mouth every 4 (four) hours as needed. For pain          please note there was some confusion in the RX in the system regarding his flagyl. It was entered as 500mg  tid, but the sig stated 1 tab bid x 7 days. We clarified this to TID which would be appropriate therapy for c diff.  Disposition   The patient will be discharged in stable condition to home. Discharge Orders    Future Appointments: Provider: Department: Dept Phone: Center:   05/12/2011 9:55 AM [facility] [phone] [facility]   05/12/2011 3:00 PM Kristian Covey, MD [facility] [phone] [facility]   05/21/2011 10:15 AM [name], MD [facility] [phone] None     Future Orders Please Complete By Expires   Diet - low sodium heart healthy      Increase activity slowly        Follow-up Information    Follow up with [facility]. (Labwork only - 05/12/11 at 9:55am to check your electrolytes and kidney function)    Contact information:   [address] [place] [address] [phone]      Follow up with  Elyn Aquas., MD. (05/21/11 at 10:15)    Contact information:   [address] [place] [address] [phone]       Follow up with Primary Doctor. (as scheduled)            Duration of Discharge Encounter: Greater than 30 minutes including physician and PA time.  Signed, Ronie Spies PA-C 05/07/2011, 10:23 AM  Attending Note:   The patient was seen and examined.  Agree with assessment and plan as noted above.  See my note from earlier today.  [name], [name]., MD, [facility] 05/07/2011, 1:35 PM

## 2011-05-07 NOTE — Progress Notes (Signed)
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 Pt is to be discharged, pt was receiving Home Health care before this admission, pt states" Ive had this bedsore since my last admission with [facility] in early March, and my home nurse knows about it and treats it". Pt is a readmit and has home health nurse to visit routinely. 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 Will follow up appropiately before pt is discharged. Marta Bouie Scientist, clinical (histocompatibility and immunogenetics).

## 2011-05-07 NOTE — Progress Notes (Signed)
DC instructions given to pt at this time re:  F/u appts, activity, diet, meds, and CHF education.  Pt verbalizes understanding of all instructions.  DC'd IV, tele monitor.  No lines left in place.  No s/s of any distress at discharge.

## 2011-05-07 NOTE — Plan of Care (Signed)
Problem: Phase I Progression Outcomes Goal: EF % per last Echo/documented,Core Reminder form on chart Outcome: Completed/Met Date Met:  05/07/11 Last 2-d echocardiogram 04/12/11:  EF 30 - 35%

## 2011-05-07 NOTE — Progress Notes (Signed)
After reviewing DC instructions, called [facility] to verify that pt's home health nurse is caring for sacral pressure ulcer.  Spoke with Harriett Sine, whom states, "He gets a hydrocolloid dressing change weekly".  No other issues.  Denies pain.

## 2011-05-07 NOTE — Progress Notes (Signed)
Cardiology Progress Note Patient Name: Kurt Richard Date of Encounter: 05/07/2011, 7:16 AM     Subjective  No overnight events. Patient reports his breathing is better this morning. He reported that his Lasix was stopped last week as he was having diarrhea. He started taking Flagyl on Monday and his last episode of diarrhea was on Monday. He apparently did not restart his Lasix.  He was restarted on Lasix and has diuresed quite well overnight.   Objective   Telemetry: Sinus rhythm, borderline 1st degree AV block, 90s-110s  Medications: . aspirin EC  325 mg Oral Daily  . atorvastatin  80 mg Oral q1800  . furosemide  40 mg Intravenous Once  . furosemide  40 mg Intravenous BID  . heparin  5,000 Units Subcutaneous Q8H  . insulin aspart  0-15 Units Subcutaneous TID WC  . linagliptin  10 mg Oral Daily  . metoprolol succinate  25 mg Oral Daily  . metroNIDAZOLE  500 mg Oral TID  . sodium chloride  3 mL Intravenous Q12H    Physical Exam: Temp:  [98 F (36.7 C)-98.9 F (37.2 C)] 98 F (36.7 C) (04/05 0601) Pulse Rate:  [97-106] 106  (04/05 0601) Resp:  [16-18] 18  (04/05 0601) BP: (97-122)/(44-77) 110/57 mmHg (04/05 0601) SpO2:  [95 %-100 %] 96 % (04/05 0601) Weight:  [152 lb 12.5 oz (69.3 kg)-155 lb 3.3 oz (70.4 kg)] 152 lb 12.5 oz (69.3 kg) (04/05 0601)  General: Pleasant elderly male, in no acute distress. Head: Normocephalic, atraumatic, sclera non-icteric, nares are without discharge.  Neck: Supple. Negative for carotid bruits or JVD Lungs: Bibasilar rales to mid lung fields. No wheezes or rhonchi. Breathing is unlabored. Heart: RRR S1 S2 without murmurs, rubs, or gallops.  Abdomen: Soft, non-tender, non-distended with normoactive bowel sounds. No rebound/guarding. No obvious abdominal masses. Msk:  Strength and tone appear normal for age. Extremities: 1+ BLE edema to knees, R>L. No clubbing or cyanosis. Distal pedal pulses are intact and equal bilaterally. Neuro: Alert  and oriented X 3. Moves all extremities spontaneously. Psych:  Responds to questions appropriately with a normal affect.   Intake/Output Summary (Last 24 hours) at 05/07/11 0716 Last data filed at 05/07/11 6213  Gross per 24 hour  Intake    544 ml  Output   2250 ml  Net  -1706 ml    Labs:  [facility] 05/06/11 0519 05/05/11 2022  NA 140 136  K 4.8 4.6  CL 108 104  CO2 24 21  GLUCOSE 113* 131*  BUN 35* 35*  CREATININE 1.34 1.20  CALCIUM 9.1 8.8  MG 1.7 --   Basename 05/06/11 0519 05/05/11 2022  WBC 8.0 7.9  NEUTROABS 4.9 5.4  HGB 8.7* 8.7*  HCT 26.0* 25.5*  MCV 88.7 87.6  PLT 332 341     05/05/2011 20:22  Pro B Natriuretic peptide (BNP) 22388.0 (H)     05/05/2011 23:11  Troponin i, poc 0.05     05/06/2011 05:19  Prothrombin Time 16.5 (H)  INR 1.31    Radiology/Studies:   05/05/2011 - CXR Findings: Lower lung volumes.  Increased bilateral pleural effusions, still small to moderate.  Worsening bibasilar opacity. Sequelae of CABG.  Stable cardiac size and mediastinal contours. Median sternotomy wires are stable.  No pneumothorax.  ACDF hardware.  IMPRESSION: Lower lung volumes with increased pleural effusions and decreased bibasilar ventilation.      Assessment and Plan  66 y.o. male who was recently discharged  after having an acute anterior MI s/p CABG 04/06/11 complicated by severely reduced LV function requiring IABP & inotropic support, acute blood loss anemia, and acute cardioembolic CVA who presented to [facility] on 05/05/11 with complaints of dyspnea.  1. Acute on Chronic Systolic CHF: He had recent bout of diarrhea and was taken off lasix and placed on flagyl for cdiff. Now presents with dyspnea and lower extremity edema. CXR with increased pleural effusions. pBNP 22,388. WBC normal, afebrile. Given IV lasix. Diuresed - overnight. Wt 157lbs on admission (baseline wt 154, morning wt pending). BUN/Crt 35/1.34. Cont diuresis with close monitoring of renal  function and electrolytes.  He is much better now that he back on lasix.  DC on home meds ( including Lasix and potassium)   To see [name] in several weeks with BMP, BNP  3. C.Diff: Cont flagyl   Alvia Grove., MD, [facility] 05/07/2011, 7:16 AM

## 2011-05-10 LAB — GLUCOSE, CAPILLARY: Glucose-Capillary: 109 mg/dL — ABNORMAL HIGH (ref 70–99)

## 2011-05-12 ENCOUNTER — Telehealth: Payer: Self-pay | Admitting: Cardiovascular Disease

## 2011-05-12 ENCOUNTER — Encounter: Payer: Self-pay | Admitting: Family Medicine

## 2011-05-12 ENCOUNTER — Ambulatory Visit (INDEPENDENT_AMBULATORY_CARE_PROVIDER_SITE_OTHER): Payer: 59 | Admitting: *Deleted

## 2011-05-12 ENCOUNTER — Ambulatory Visit (INDEPENDENT_AMBULATORY_CARE_PROVIDER_SITE_OTHER): Payer: 59 | Admitting: Family Medicine

## 2011-05-12 VITALS — BP 100/68 | HR 100 | Temp 98.6°F | Resp 12 | Ht 66.5 in | Wt 157.0 lb

## 2011-05-12 DIAGNOSIS — I255 Ischemic cardiomyopathy: Secondary | ICD-10-CM

## 2011-05-12 DIAGNOSIS — I219 Acute myocardial infarction, unspecified: Secondary | ICD-10-CM

## 2011-05-12 DIAGNOSIS — I2589 Other forms of chronic ischemic heart disease: Secondary | ICD-10-CM

## 2011-05-12 DIAGNOSIS — I213 ST elevation (STEMI) myocardial infarction of unspecified site: Secondary | ICD-10-CM

## 2011-05-12 DIAGNOSIS — E785 Hyperlipidemia, unspecified: Secondary | ICD-10-CM

## 2011-05-12 DIAGNOSIS — E119 Type 2 diabetes mellitus without complications: Secondary | ICD-10-CM

## 2011-05-12 DIAGNOSIS — I5022 Chronic systolic (congestive) heart failure: Secondary | ICD-10-CM

## 2011-05-12 DIAGNOSIS — I2510 Atherosclerotic heart disease of native coronary artery without angina pectoris: Secondary | ICD-10-CM

## 2011-05-12 DIAGNOSIS — D649 Anemia, unspecified: Secondary | ICD-10-CM

## 2011-05-12 DIAGNOSIS — I10 Essential (primary) hypertension: Secondary | ICD-10-CM

## 2011-05-12 LAB — BASIC METABOLIC PANEL
BUN: 38 mg/dL — ABNORMAL HIGH (ref 6–23)
Chloride: 101 mEq/L (ref 96–112)
GFR: 46.14 mL/min — ABNORMAL LOW (ref >60.00)
Potassium: 4.2 mEq/L (ref 3.5–5.1)
Sodium: 138 mEq/L (ref 135–145)

## 2011-05-12 LAB — BRAIN NATRIURETIC PEPTIDE: Pro B Natriuretic peptide (BNP): 1595 pg/mL — ABNORMAL HIGH (ref 0.0–100.0)

## 2011-05-12 MED ORDER — LINAGLIPTIN 5 MG PO TABS: 5.0000 mg | ORAL_TABLET | Freq: Every day | ORAL | Status: DC

## 2011-05-12 NOTE — Patient Instructions (Signed)
Get back on oral diabetic medication initially one daily Continue daily monitoring of weight Followup promptly for increased shortness of breath or rapid weight gain Followup promptly for any recurrent diarrhea after Flagyl discontinued

## 2011-05-12 NOTE — Progress Notes (Signed)
Subjective:    Patient ID: Kurt Richard, male    DOB: November 10, 1945, 66 y.o.   MRN: [id number]  HPI  New patient to establish care. Patient has history of type 2 diabetes, coronary artery disease, congestive heart failure, cerebrovascular disease, hypertension, hyperlipidemia, and history of nicotine abuse. History is that he was admitted back in March with anterior MI with emergent bypass for left main coronary artery disease and three-vessel disease. Hospitalization was complicated by left ventricular dysfunction. He was then readmitted on 05/05/2011 for 2 days with recurrent dyspnea. He had stopped taking his Lasix. He had elevated BNP levels and was improved following IV Lasix. His other problems include chronic kidney disease with recent discharge creatinine 1.54. Anemia with hemoglobin of 8.7.  Diarrhea is improving on Flagyl. C. difficile polymerase chain reaction test was never sent. Patient denies chest pain. Increased malaise. Poor appetite. Some shortness of breath with exertion but no chest pain. Blood sugars somewhat elevated 167-205 range. Patient has previously been treated with Tradjenta 5 mg and apparently they were giving this BID though standard dose is one daily.  He has not had previous Pneumovax.  Past Medical History  Diagnosis Date  . Diabetes mellitus   . Hypertension   . Chronic systolic heart failure     echo 04/12/11: Mild LVH, EF 30-35%, mid to distal anteroseptal and apical hypokinesis, grade 2 diastolic dysfunction, moderate LAE, mild RAE.  [name] Hyperlipidemia   . Tobacco abuse   . CAD (coronary artery disease)     acute anterior STEMI, late presentation 04/06/11 LHC 3/5 demonstrated severe ostial left main 95% stenosis and otherwise three-vessel CAD with an ejection fraction of 15%.  Emergent CABG: Dr. Dorris Fetch - Grafts: LIMA-LAD, SVG-D1, SVG-OM1, SVG-PDA and PL.  . Ischemic cardiomyopathy   . DM2 (diabetes mellitus, type 2)   . Cardioembolic stroke     post  bypass 04/2011  . Acute renal insufficiency     05/2011 admission (cr 1.54 at discharge)  . Anemia     Post-CABG   Past Surgical History  Procedure Date  . Coronary artery bypass graft 04/06/2011    Procedure: CORONARY ARTERY BYPASS GRAFTING (CABG);  Surgeon: Loreli Slot, MD;  Location: [facility] OR;  Service: Open Heart Surgery;  Laterality: N/A;  Coronary Artery Bypass Graft times four utilizing the left internal mammary artery and the Right and left  greater Saphenous veins Harvested endoscopically.  . Fibroid bypass 04/06/11     reports that he quit smoking about 7 weeks ago. His smoking use included Cigarettes. He smoked .5 packs per day. He does not have any smokeless tobacco history on file. He reports that he does not drink alcohol or use illicit drugs. family history includes Heart attack in his mother and sister. Allergies  Allergen Reactions  . Cardizem Hives  . Nifedipine Swelling      Review of Systems  Constitutional: Positive for appetite change and fatigue. Negative for fever and chills.  HENT: Negative for trouble swallowing.   Eyes: Negative for visual disturbance.  Respiratory: Positive for shortness of breath. Negative for wheezing.   Cardiovascular: Positive for leg swelling. Negative for chest pain and palpitations.  Gastrointestinal: Negative for nausea, vomiting and abdominal pain.  Genitourinary: Negative for dysuria.  Musculoskeletal: Negative for back pain.  Neurological: Positive for weakness. Negative for dizziness and syncope.  Hematological: Negative for adenopathy.  Psychiatric/Behavioral: Negative for confusion.       Objective:   Physical Exam  Constitutional: He is oriented  to person, place, and time.       Patient is alert somewhat pale in appearance in no distress  HENT:  Mouth/Throat: Oropharynx is clear and moist.  Neck: Neck supple.  Cardiovascular: Normal rate and regular rhythm.   Pulmonary/Chest:       Diminished breath sounds left  base probably secondary to effusion which is previously been noted on chest x-ray. No wheezes.  Abdominal: Soft. There is no tenderness.  Musculoskeletal: He exhibits no edema.  Neurological: He is alert and oriented to person, place, and time. No cranial nerve deficit.  Skin: No rash noted.  Psychiatric: He has a normal mood and affect. His behavior is normal.          Assessment & Plan:  #1 history of CAD with acute systolic heart failure. Currently appears stable clinically. Recent labs this morning including repeat BNP level. They're monitoring his weight daily. Intolerant of ACE inhibitors secondary to low blood pressure. He is on Lasix and metoprolol.  Needs pneumovax-given today. #2 type 2 diabetes. Start Back Tradjenta. Consider repeat A1c at follow up.  Avoid metformin with Cr 1.5 #3 recent diarrhea with presumptive diagnosis C. difficile colitis improved on Flagyl. Finish out Flagyl follow up promptly for any recurrent diarrhea #4 chronic kidney disease. Recent labs repeated this morning  #5 anemia. Recheck CBC at followup.

## 2011-05-12 NOTE — Telephone Encounter (Signed)
Walk in pt Form " Pt Is having some Blurred Vision" sent to [facility] 05/12/11/KM

## 2011-05-14 ENCOUNTER — Telehealth: Payer: Self-pay | Admitting: Cardiovascular Disease

## 2011-05-14 NOTE — Telephone Encounter (Signed)
Patient aware of lab results.

## 2011-05-14 NOTE — Telephone Encounter (Signed)
Fu call °Pt returning your call  °

## 2011-05-21 ENCOUNTER — Encounter: Payer: Self-pay | Admitting: Cardiovascular Disease

## 2011-05-21 ENCOUNTER — Ambulatory Visit (INDEPENDENT_AMBULATORY_CARE_PROVIDER_SITE_OTHER): Payer: 59 | Admitting: Cardiovascular Disease

## 2011-05-21 VITALS — BP 117/71 | HR 97 | Resp 18 | Ht 67.0 in | Wt 157.8 lb

## 2011-05-21 DIAGNOSIS — I255 Ischemic cardiomyopathy: Secondary | ICD-10-CM

## 2011-05-21 DIAGNOSIS — I2510 Atherosclerotic heart disease of native coronary artery without angina pectoris: Secondary | ICD-10-CM

## 2011-05-21 DIAGNOSIS — I2589 Other forms of chronic ischemic heart disease: Secondary | ICD-10-CM

## 2011-05-21 MED ORDER — ATORVASTATIN CALCIUM 80 MG PO TABS: 80.0000 mg | ORAL_TABLET | Freq: Every day | ORAL | Status: DC

## 2011-05-21 MED ORDER — METOPROLOL SUCCINATE ER 50 MG PO TB24: 50.0000 mg | ORAL_TABLET | Freq: Two times a day (BID) | ORAL | Status: DC

## 2011-05-21 NOTE — Patient Instructions (Signed)
Your physician recommends that you schedule a follow-up appointment in: 1 MONTH   Your physician has recommended you make the following change in your medication:   INCREASE METOPROLOL FROM 25 MG TO 50 MG TWICE DAILY 12 HRS APART   Your physician recommends that you return for lab work in: 1 MONTH //BMET

## 2011-05-21 NOTE — Assessment & Plan Note (Addendum)
He has done well from a cardiac standpoint.  He is making slow progress.    He has a small suture coming from the middle of the sternotomy scar. The suture fell off as I was bathing the wound with Betadine. He needs to  check back with Dr. Orson Aloe to below to be released from a cardiac surgery standpoint.  He has been getting home physical therapy from a home health nurse. Her recommendation is that he needs 2 more weeks of home health physical therapy before starting cardiac rehabilitation.  We will be refilling the atorvastatin.

## 2011-05-21 NOTE — Assessment & Plan Note (Addendum)
He will continue on the current dose of Lasix.  He is also on a low dose of metoprolol. We will consider starting him on an ACE inhibitor as he improves.

## 2011-05-21 NOTE — Progress Notes (Signed)
Kurt Richard Date of Birth  09-11-1945 [facility]     [place] Office  [address]    [address]   [address] [place], [place]  [address]    [place], [place]  [address] [phone]  Fax  [phone]  [phone]  Fax [phone]  Problem list: 1. Acute on chronic systolic CHF  2. Recent diarrhea, on Flagyl for presumed C diff  3. Acute renal insufficiency, d/c Cr 1.54  4. CAD without evidence of ischemia this admission  - recent late-presenting anterior MI 04/2011 with 3V dz s/p CABG  - postop course 04/2011 complicated by LV dysfunction requiring IABP/inotropes, ABL anemia, CVA  Secondary Discharge Diagnoses:  1. 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 Diabetes mellitus  3. Tobacco abuse  4. hypertension 5. hyperlipidemia 5. hyperlipidemia  History of Present Illness:  Kurt Richard is a 66 year old gentleman with the above-noted medical history. He status post recent coronary artery bypass grafting on March 5. He developed C. difficile colitis. He was admitted several weeks ago with volume overload. This occurred when we held his Lasix because of his profuse diarrhea. He is now been restarted on his medications and is feeling quite a bit better.  He still somewhat limited but is overall gradually regaining his strength.  His diet is gradually improving. He still not sleeping at night. He has had a postnasal drip since his surgery to  Current Outpatient Prescriptions on File Prior to Visit  Medication Sig Dispense Refill  . aspirin 325 MG EC tablet Take 325 mg by mouth daily.      [name] atorvastatin (LIPITOR) 80 MG tablet Take 80 mg by mouth daily at 6 PM.      . furosemide (LASIX) 40 MG tablet Take 40 mg by mouth daily.       [name] linagliptin (TRADJENTA) 5 MG TABS tablet Take 1 tablet (5 mg total) by mouth daily.  30 tablet  11  . metoprolol succinate (TOPROL-XL) 25 MG 24 hr tablet Take 25 mg by mouth 2 (two) times daily.       . nitroGLYCERIN (NITROSTAT) 0.4 MG SL tablet Place  0.4 mg under the tongue every 5 (five) minutes as needed. For chest pain      . oxycodone (OXY-IR) 5 MG capsule Take 5 mg by mouth every 4 (four) hours as needed. For pain        Allergies  Allergen Reactions  . Cardizem Hives  . Nifedipine Swelling    Past Medical History  Diagnosis Date  . Diabetes mellitus   . Hypertension   . Chronic systolic heart failure     echo 04/12/11: Mild LVH, EF 30-35%, mid to distal anteroseptal and apical hypokinesis, grade 2 diastolic dysfunction, moderate LAE, mild RAE.  [name] Hyperlipidemia   . Tobacco abuse   . CAD (coronary artery disease)     acute anterior STEMI, late presentation 04/06/11 LHC 3/5 demonstrated severe ostial left main 95% stenosis and otherwise three-vessel CAD with an ejection fraction of 15%.  Emergent CABG: Dr. Dorris Fetch - Grafts: LIMA-LAD, SVG-D1, SVG-OM1, SVG-PDA and PL.  . Ischemic cardiomyopathy   . DM2 (diabetes mellitus, type 2)   . 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 Acute renal insufficiency     05/2011 admission (cr 1.54 at discharge)  . Anemia     Post-CABG    Past Surgical History  Procedure Date  . Coronary artery bypass graft 04/06/2011    Procedure: CORONARY ARTERY  BYPASS GRAFTING (CABG);  Surgeon: Loreli Slot, MD;  Location: [facility] OR;  Service: Open Heart Surgery;  Laterality: N/A;  Coronary Artery Bypass Graft times four utilizing the left internal mammary artery and the Right and left  greater Saphenous veins Harvested endoscopically.  . Fibroid bypass 04/06/11     History  Smoking status  . Former Smoker -- 0.5 packs/day  . Types: Cigarettes  . Quit date: 03/22/2011  Smokeless tobacco  . Not on file    History  Alcohol Use No    Family History  Problem Relation Age of Onset  . Heart attack Mother     ?20s  . Heart attack Sister     50s    Reviw of Systems:  Reviewed in the HPI.  All other systems are negative.  Physical Exam: Blood pressure 117/71, pulse 97, resp. rate 18,  height 5\' 7"  (1.702 m), weight 157 lb 12.8 oz (71.578 kg). General: Well developed, well nourished, in no acute distress.  Head: Normocephalic, atraumatic, sclera non-icteric, mucus membranes are moist,   Neck: Supple. Carotids are 2 + without bruits. No JVD  Lungs: Clear bilaterally to auscultation.  Heart: regular rate.  normal  S1 S2. No murmurs, gallops or rubs.  He had a small suture in the middle of his sternotomy scar. It was bathed with Betadine and fell off as I was wiping down the wound.  There is no evidence of infection.  Abdomen: Soft, non-tender, non-distended with normal bowel sounds. No hepatomegaly. No rebound/guarding. No masses.  Msk:  Strength and tone are normal  Extremities: No clubbing or cyanosis. No edema.  Distal pedal pulses are 2+ and equal bilaterally.  Neuro: Alert and oriented X 3. Moves all extremities spontaneously.  Psych:  Responds to questions appropriately with a normal affect.  ECG:  Assessment / Plan:

## 2011-05-24 ENCOUNTER — Telehealth: Payer: Self-pay | Admitting: Cardiovascular Disease

## 2011-05-24 NOTE — Telephone Encounter (Signed)
Home health PT for additional 2-3 weeks, pls call

## 2011-05-24 NOTE — Telephone Encounter (Signed)
Explained the metoprolol is being used for HR control and we already have a lab date on return visit. Pt verbalized understanding.

## 2011-05-24 NOTE — Telephone Encounter (Signed)
Pt had a increase in metoprolol and Home health wants to know why this was done. And they want to know if they need to go a head and set up an appt for labwork. They would like a call regarding this

## 2011-05-24 NOTE — Telephone Encounter (Signed)
Called physical therapist back and gave verbal order to continue pt for additional weeks, paper order to follow.

## 2011-05-26 ENCOUNTER — Telehealth: Payer: Self-pay | Admitting: *Deleted

## 2011-05-26 MED ORDER — METRONIDAZOLE 500 MG PO TABS: 500.0000 mg | ORAL_TABLET | Freq: Three times a day (TID) | ORAL | Status: AC

## 2011-05-26 NOTE — Telephone Encounter (Signed)
Start back Flagyl 500 mg po tid for 10 days

## 2011-05-26 NOTE — Telephone Encounter (Signed)
Pt made aware

## 2011-05-26 NOTE — Telephone Encounter (Signed)
Pt finished the antibiotics last Friday, and the diarrhea has come back just as bad as it was at initial onset.  Is asking if he needs to have more antibiotics.?  Was diagnosed with C. Diff.

## 2011-05-26 NOTE — Telephone Encounter (Signed)
Please advise, return OV?

## 2011-06-01 ENCOUNTER — Other Ambulatory Visit: Payer: Self-pay | Admitting: Thoracic Surgery (Cardiothoracic Vascular Surgery)

## 2011-06-01 DIAGNOSIS — I2510 Atherosclerotic heart disease of native coronary artery without angina pectoris: Secondary | ICD-10-CM

## 2011-06-02 ENCOUNTER — Encounter: Payer: Self-pay | Admitting: Thoracic Surgery (Cardiothoracic Vascular Surgery)

## 2011-06-02 ENCOUNTER — Ambulatory Visit (INDEPENDENT_AMBULATORY_CARE_PROVIDER_SITE_OTHER): Payer: Self-pay | Admitting: Thoracic Surgery (Cardiothoracic Vascular Surgery)

## 2011-06-02 ENCOUNTER — Ambulatory Visit
Admission: RE | Admit: 2011-06-02 | Discharge: 2011-06-02 | Disposition: A | Discharge status: Home / Self Care | Payer: 59 | Source: Ambulatory Visit | Attending: Thoracic Surgery (Cardiothoracic Vascular Surgery) | Admitting: Thoracic Surgery (Cardiothoracic Vascular Surgery)

## 2011-06-02 VITALS — BP 95/61 | HR 87 | Resp 18 | Ht 67.0 in | Wt 148.0 lb

## 2011-06-02 DIAGNOSIS — I2510 Atherosclerotic heart disease of native coronary artery without angina pectoris: Secondary | ICD-10-CM

## 2011-06-02 DIAGNOSIS — Z951 Presence of aortocoronary bypass graft: Secondary | ICD-10-CM

## 2011-06-02 NOTE — Progress Notes (Signed)
HPI:  Kurt Richard is a 66 year old gentleman who returns today for a scheduled postoperative followup visit. He presented on March 5 with an acute ST elevation MI. He was taken urgently to the Cath Lab where he was found to have critical left main and three-vessel disease. He required an intra-aortic balloon pump. He was taken up for emergency coronary bypass grafting x5. Postoperative course was marked by acute renal failure which resolved and also a probable stroke, although only an old stroke was ever documented by CT scan. He did gradually recover and was discharged home.  After discharge he presented back emergency room and found to have C. difficile colitis with diarrhea. He subsequently was readmitted with congestive heart failure.  He states that his diarrhea was severe for about 2 weeks. One started on Flagyl initially the diarrhea stopped. Within 2 days of stopping the Flagyl his diarrhea recurred. He subsequently was restarted on Flagyl, but this time the diarrhea did not stop immediately. He has however noted that over the past 2 days his diarrhea has improved significantly although the stools are still watery.  Past Medical History  Diagnosis Date  . Diabetes mellitus   . Hypertension   . Chronic systolic heart failure     echo 04/12/11: Mild LVH, EF 30-35%, mid to distal anteroseptal and apical hypokinesis, grade 2 diastolic dysfunction, moderate LAE, mild RAE.  [name] Hyperlipidemia   . Tobacco abuse   . CAD (coronary artery disease)     acute anterior STEMI, late presentation 04/06/11 LHC 3/5 demonstrated severe ostial left main 95% stenosis and otherwise three-vessel CAD with an ejection fraction of 15%.  Emergent CABG: Dr. Dorris Fetch - Grafts: LIMA-LAD, SVG-D1, SVG-OM1, SVG-PDA and PL.  . Ischemic cardiomyopathy   . DM2 (diabetes mellitus, type 2)   . 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 Acute renal insufficiency     05/2011 admission (cr 1.54 at discharge)  . Anemia    Post-CABG      Current Outpatient Prescriptions  Medication Sig Dispense Refill  . aspirin 325 MG EC tablet Take 325 mg by mouth daily.      [name] atorvastatin (LIPITOR) 80 MG tablet Take 1 tablet (80 mg total) by mouth daily at 6 PM.  30 tablet  5  . furosemide (LASIX) 40 MG tablet Take 40 mg by mouth daily.       [name] linagliptin (TRADJENTA) 5 MG TABS tablet Take 1 tablet (5 mg total) by mouth daily.  30 tablet  11  . metoprolol succinate (TOPROL-XL) 50 MG 24 hr tablet Take 1 tablet (50 mg total) by mouth 2 (two) times daily.  60 tablet  5  . metroNIDAZOLE (FLAGYL) 500 MG tablet Take 1 tablet (500 mg total) by mouth 3 (three) times daily.  30 tablet  0  . nitroGLYCERIN (NITROSTAT) 0.4 MG SL tablet Place 0.4 mg under the tongue every 5 (five) minutes as needed. For chest pain      . oxycodone (OXY-IR) 5 MG capsule Take 5 mg by mouth every 4 (four) hours as needed. For pain        Physical Exam BP 95/61  Pulse 87  Resp 18  Ht 5\' 7"  (1.702 m)  Wt 148 lb (67.132 kg)  BMI 23.18 kg/m2  SpO18 73% 66 year old male with obvious recent weight loss General he appears rather weak Lungs clear with equal breath sounds bilaterally Cardiac regular rate and rhythm normal S1 and S2 Sternum stable, incision healing well  Leg incisions healing well, no peripheral edema  Diagnostic Tests: Chest x-ray shows some persistent left lower lobe atelectasis and a small left effusion  Impression: Kurt Richard is a 66 year old gentleman who underwent emergent coronary bypass grafting x5 for severe left main and three-vessel disease with an ST elevation MI and severe left ventricular dysfunction. His biggest problem at the present time is his diarrhea. This is better really significant issue for him and has affected his appetite as well as his ability to exercise. He is been treated with Flagyl for C. difficile colitis. Unfortunately his diarrhea recurred within 48 hours of stopping the Flagyl it has now been restarted.  However his diarrhea has not been his responsive to the Flagyl second time around. He has however noted significant improvement with only 2 episodes yesterday and only one today. I advised him that if his diarrhea were to worsen of all not to wait for his next appointment with Dr. Sander Radon in a week but to call one of [place] so that we could consider changing him to PO vancomycin if necessary.  From a cardiac standpoint he appears to be doing well now. He does not appear to be significantly volume overloaded.  From the standpoint of his surgery there really are no restrictions on him at this point in time as he is 2 months out. However he should wait until his other issues, in particular the colitis, resolve before resuming full activities or attempting to drive.  He had questions about when to start cardiac rehabilitation. He was originally scheduled to start next week. I advised him to wait at least another week beyond that before starting.  Plan: I will plan to see him back in about 3 weeks to check on his progress, but he knows to call if he has any issues that need to be addressed in the meantime.

## 2011-06-03 ENCOUNTER — Ambulatory Visit (HOSPITAL_COMMUNITY): Payer: 59

## 2011-06-07 ENCOUNTER — Encounter (HOSPITAL_COMMUNITY): Payer: 59

## 2011-06-09 ENCOUNTER — Encounter (HOSPITAL_COMMUNITY): Payer: 59

## 2011-06-10 ENCOUNTER — Encounter (HOSPITAL_COMMUNITY)
Admission: RE | Admit: 2011-06-10 | Discharge: 2011-06-10 | Disposition: A | Discharge status: Home / Self Care | Payer: 59 | Source: Ambulatory Visit | Attending: Cardiovascular Disease | Admitting: Cardiovascular Disease

## 2011-06-10 DIAGNOSIS — I509 Heart failure, unspecified: Secondary | ICD-10-CM | POA: Insufficient documentation

## 2011-06-10 DIAGNOSIS — E785 Hyperlipidemia, unspecified: Secondary | ICD-10-CM | POA: Insufficient documentation

## 2011-06-10 DIAGNOSIS — I2510 Atherosclerotic heart disease of native coronary artery without angina pectoris: Secondary | ICD-10-CM | POA: Insufficient documentation

## 2011-06-10 DIAGNOSIS — E119 Type 2 diabetes mellitus without complications: Secondary | ICD-10-CM | POA: Insufficient documentation

## 2011-06-10 DIAGNOSIS — Z951 Presence of aortocoronary bypass graft: Secondary | ICD-10-CM | POA: Insufficient documentation

## 2011-06-10 DIAGNOSIS — F17200 Nicotine dependence, unspecified, uncomplicated: Secondary | ICD-10-CM | POA: Insufficient documentation

## 2011-06-10 DIAGNOSIS — Z5189 Encounter for other specified aftercare: Secondary | ICD-10-CM | POA: Insufficient documentation

## 2011-06-10 DIAGNOSIS — I252 Old myocardial infarction: Secondary | ICD-10-CM | POA: Insufficient documentation

## 2011-06-10 NOTE — Progress Notes (Signed)
Cardiac Rehab Medication Review by a Pharmacist  Does the patient  feel that his/her medications are working for him/her?  yes  Has the patient been experiencing any side effects to the medications prescribed?  no  Does the patient measure his/her own blood pressure or blood glucose at home?  Weight, pulse ox, BP and CBGs daily; mostly SBP 90-110s / DBP 60-80s; reports that weight has gone down due to c.diff infection now resolved after Flagyl course   Does the patient have any problems obtaining medications due to transportation or finances?   no  Understanding of regimen: good Understanding of indications: good Potential of compliance: excellent  Pharmacist comments: Patient reports c.diff now resolved (no diarrhea since 5/4); patient complains of runny nose (s/p CABG) not really improving with antihistamines. Has tried claritin, Careers adviser, and is now taking bendryl with only slight improvement--may need further evaluation. BP running low so would likely not tolerate further blood pressure medications (ie. ACEI)  Patient has caregiver that helps with medications and medical care.   Benjaman Pott, PharmD     Pager [phone] 06/10/2011   8:19 AM

## 2011-06-11 ENCOUNTER — Ambulatory Visit (INDEPENDENT_AMBULATORY_CARE_PROVIDER_SITE_OTHER): Payer: 59 | Admitting: Family Medicine

## 2011-06-11 ENCOUNTER — Encounter (HOSPITAL_COMMUNITY): Payer: 59

## 2011-06-11 ENCOUNTER — Encounter: Payer: Self-pay | Admitting: Family Medicine

## 2011-06-11 ENCOUNTER — Telehealth: Payer: Self-pay | Admitting: *Deleted

## 2011-06-11 VITALS — BP 120/60 | Temp 98.5°F | Wt 150.0 lb

## 2011-06-11 DIAGNOSIS — E785 Hyperlipidemia, unspecified: Secondary | ICD-10-CM

## 2011-06-11 DIAGNOSIS — A0472 Enterocolitis due to Clostridium difficile, not specified as recurrent: Secondary | ICD-10-CM

## 2011-06-11 DIAGNOSIS — I219 Acute myocardial infarction, unspecified: Secondary | ICD-10-CM

## 2011-06-11 DIAGNOSIS — I213 ST elevation (STEMI) myocardial infarction of unspecified site: Secondary | ICD-10-CM

## 2011-06-11 DIAGNOSIS — E119 Type 2 diabetes mellitus without complications: Secondary | ICD-10-CM

## 2011-06-11 LAB — LIPID PANEL
Cholesterol: 111 mg/dL (ref 0–200)
HDL: 35.5 mg/dL — ABNORMAL LOW (ref >39.00)
LDL Cholesterol: 43 mg/dL (ref 0–99)
Triglycerides: 162 mg/dL — ABNORMAL HIGH (ref 0.0–149.0)
VLDL: 32.4 mg/dL (ref 0.0–40.0)

## 2011-06-11 LAB — HEPATIC FUNCTION PANEL
ALT: 24 U/L (ref 0–53)
AST: 26 U/L (ref 0–37)
Albumin: 3.7 g/dL (ref 3.5–5.2)
Alkaline Phosphatase: 107 U/L (ref 39–117)
Bilirubin, Direct: 0.1 mg/dL (ref 0.0–0.3)
Total Bilirubin: 0.5 mg/dL (ref 0.3–1.2)
Total Protein: 7.3 g/dL (ref 6.0–8.3)

## 2011-06-11 LAB — HEMOGLOBIN A1C: Hgb A1c MFr Bld: 6.2 % (ref 4.6–6.5)

## 2011-06-11 MED ORDER — FLUTICASONE PROPIONATE 50 MCG/ACT NA SUSP: 2.0000 | Freq: Every day | NASAL | Status: DC

## 2011-06-11 MED ORDER — VANCOMYCIN HCL 125 MG PO CAPS: 125.0000 mg | ORAL_CAPSULE | Freq: Four times a day (QID) | ORAL | Status: AC

## 2011-06-11 NOTE — Telephone Encounter (Signed)
Spoke with patient and he is aware that the Rx has been sent

## 2011-06-11 NOTE — Telephone Encounter (Signed)
I will order. Sorry, I overlooked.

## 2011-06-11 NOTE — Progress Notes (Signed)
Subjective:    Patient ID: Kurt Richard, male    DOB: 10/26/1945, 66 y.o.   MRN: [id number]  HPI  Medical followup. Recent MI and coronary artery bypass graft. Has done well postoperatively with the exception of C. difficile associated diarrhea. He had initial improvement with first course of Flagyl but diarrhea never resolved with second course. He stopped Flagyl altogether on Sunday and has had up to 4 stools a day past couple days. These are watery nonbloody stools. No fever or chills. No abdominal pain. We need to consider alternatives such as vancomycin at this time.  Type 2 diabetes. Fasting blood sugars 140-170. Not a candidate for metformin with creatinine around 1.7. Currently takes Tradjenta.  His appetite has been poor and somewhat poor intake  Postnasal drip symptoms for the past couple months. No fever. No facial pain. No purulent secretions. No relief with antihistamines including Allegra, Zyrtec, or Claritin.  Patient started on Lipitor apparently during hospitalization. No myalgias. No recent hepatic panel.  Past Medical History  Diagnosis Date  . Diabetes mellitus   . Hypertension   . Chronic systolic heart failure     echo 04/12/11: Mild LVH, EF 30-35%, mid to distal anteroseptal and apical hypokinesis, grade 2 diastolic dysfunction, moderate LAE, mild RAE.  [name] Hyperlipidemia   . Tobacco abuse   . CAD (coronary artery disease)     acute anterior STEMI, late presentation 04/06/11 LHC 3/5 demonstrated severe ostial left main 95% stenosis and otherwise three-vessel CAD with an ejection fraction of 15%.  Emergent CABG: Dr. Dorris Fetch - Grafts: LIMA-LAD, SVG-D1, SVG-OM1, SVG-PDA and PL.  . Ischemic cardiomyopathy   . DM2 (diabetes mellitus, type 2)   . 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 Acute renal insufficiency     05/2011 admission (cr 1.54 at discharge)  . Anemia     Post-CABG   Past Surgical History  Procedure Date  . Coronary artery bypass graft 04/06/2011     Procedure: CORONARY ARTERY BYPASS GRAFTING (CABG);  Surgeon: Loreli Slot, MD;  Location: [facility] OR;  Service: Open Heart Surgery;  Laterality: N/A;  Coronary Artery Bypass Graft times four utilizing the left internal mammary artery and the Right and left  greater Saphenous veins Harvested endoscopically.  . Fibroid bypass 04/06/11     reports that he quit smoking about 2 months ago. His smoking use included Cigarettes. He smoked .5 packs per day. He does not have any smokeless tobacco history on file. He reports that he does not drink alcohol or use illicit drugs. family history includes Heart attack in his mother and sister. Allergies  Allergen Reactions  . Diltiazem Hcl Hives and Swelling  . Nifedipine Hives and Swelling      Review of Systems  Constitutional: Positive for appetite change and fatigue. Negative for fever, chills and unexpected weight change.  Respiratory: Negative for cough and shortness of breath.   Cardiovascular: Negative for chest pain.  Gastrointestinal: Positive for diarrhea. Negative for nausea, vomiting, abdominal pain, blood in stool and abdominal distention.  Genitourinary: Negative for dysuria.  Neurological: Negative for syncope.       Objective:   Physical Exam  Constitutional: He is oriented to person, place, and time. He appears well-developed and well-nourished. No distress.  Cardiovascular: Normal rate and regular rhythm.   Pulmonary/Chest: Effort normal and breath sounds normal. No respiratory distress. He has no wheezes. He has no rales.  Abdominal: Soft. Bowel sounds are normal. He exhibits no  distension and no mass. There is no tenderness. There is no rebound and no guarding.  Musculoskeletal: He exhibits no edema.  Neurological: He is alert and oriented to person, place, and time.          Assessment & Plan:  #1 coronary artery disease with recent bypass graft recovering well. preparing to start cardiac rehabilitation. #2 C.  difficile associated diarrhea. Resistant to 2 courses of Flagyl. Start vancomycin 125 mg 4 times a day for 10 days  #3 dyslipidemia. Check lipid and hepatic panel #4 type 2 diabetes. Recheck A1c.

## 2011-06-11 NOTE — Telephone Encounter (Signed)
Family was under the impression that Dr Caryl Never was sending in [facility] for the pt to [facility], and it was not there.  Was it correct???

## 2011-06-14 ENCOUNTER — Encounter (HOSPITAL_COMMUNITY)
Admission: RE | Admit: 2011-06-14 | Discharge: 2011-06-14 | Disposition: A | Discharge status: Home / Self Care | Payer: 59 | Source: Ambulatory Visit | Attending: Cardiovascular Disease | Admitting: Cardiovascular Disease

## 2011-06-14 LAB — GLUCOSE, CAPILLARY
Glucose-Capillary: 196 mg/dL — ABNORMAL HIGH (ref 70–99)
Glucose-Capillary: 171 mg/dL — ABNORMAL HIGH (ref 70–99)

## 2011-06-14 NOTE — Progress Notes (Signed)
Pt started cardiac rehab today.  Pt tolerated light exercise with some complaints of fatigue.  Pt modified his exercise with the use of the [employer] during the track station and completed his light hand weights in a seated position.  Pt post blood pressure was 91/48 on the auto cuff.  Pt given gatorade and bp increased to 104/62.  Will continue to monitor and adjust workloads for tolerance and endurance.  Monitor showed sr with with no noted ectopy.

## 2011-06-14 NOTE — Progress Notes (Signed)
Quick Note:  Pt informed on home VM ______ 

## 2011-06-16 ENCOUNTER — Encounter (HOSPITAL_COMMUNITY)
Admission: RE | Admit: 2011-06-16 | Discharge: 2011-06-16 | Disposition: A | Discharge status: Home / Self Care | Payer: 59 | Source: Ambulatory Visit | Attending: Cardiovascular Disease | Admitting: Cardiovascular Disease

## 2011-06-17 ENCOUNTER — Other Ambulatory Visit: Payer: Self-pay | Admitting: Thoracic Surgery (Cardiothoracic Vascular Surgery)

## 2011-06-17 DIAGNOSIS — I2510 Atherosclerotic heart disease of native coronary artery without angina pectoris: Secondary | ICD-10-CM

## 2011-06-17 NOTE — Progress Notes (Signed)
Kurt Richard 66 y.o. male       Nutrition Screen                                                                    YES  NO Do you live in a nursing home?  X   Do you eat out more than 3 times/week?   X  If yes, how many times per week do you eat out? 4-6 times a week  Do you have food allergies?   X If yes, what are you allergic to?  Have you gained or lost more than 10 lbs without trying?              X X If yes, how much weight have you lost and over what time period? 100 lbs lost over 12 months  Do you want to lose weight?     X If yes, what is a goal weight or amount of weight you would like to lose?  Do you eat alone most of the time?   X   Do you eat less than 2 meals/day?  X If yes, how many meals do you eat?    Do you drink more than 3 alcohol drinks/day?  X If yes, how many drinks per day?   Are you having trouble with constipation? *  X If yes, what are you doing to help relieve constipation?  Do you have financial difficulties with buying food?*    X   Are you experiencing regular nausea/ vomiting?*     X   Do you have a poor appetite? *                                       X    Do you have trouble chewing/swallowing? *   X    Pt with diagnoses of:  X CABG              X Dyslipidemia  / HDL< 40 / LDL>70 / High TG      X %  Body fat >goal / Body Mass Index >25 X HTN / BP >120/80 X MI XCHF  X DM/A1c >6 / CBG >126       Pt Risk Score   7       Diagnosis Risk Score  125       Total Risk Score   132                        X High Risk                Low Risk    HT: 66.25" Ht Readings from Last 1 Encounters:  06/10/11 5' 6.25" (1.683 m)    WT:   148.3 lb (67.4 kg) Wt Readings from Last 3 Encounters:  06/11/11 150 lb (68.04 kg)  06/10/11 148 lb 9.4 oz (67.4 kg)  06/02/11 148 lb (67.132 kg)     IBW 65.2 103%IBW BMI 23.8 24.4%body fat  Meds reviewed: Tradjenta, vancomycin Past Medical History  Diagnosis Date  . Diabetes mellitus   . Hypertension   . Chronic  systolic heart failure     echo 04/12/11: Mild LVH, EF 30-35%, mid to distal anteroseptal and apical hypokinesis, grade 2 diastolic dysfunction, moderate LAE, mild RAE.  [name] Hyperlipidemia   . Tobacco abuse   . CAD (coronary artery disease)     acute anterior STEMI, late presentation 04/06/11 LHC 3/5 demonstrated severe ostial left main 95% stenosis and otherwise three-vessel CAD with an ejection fraction of 15%.  Emergent CABG: Dr. Dorris Fetch - Grafts: LIMA-LAD, SVG-D1, SVG-OM1, SVG-PDA and PL.  . Ischemic cardiomyopathy   . DM2 (diabetes mellitus, type 2)   . 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 Acute renal insufficiency     05/2011 admission (cr 1.54 at discharge)  . Anemia     Post-CABG       Activity level: Pt is sedentary  Wt goal: 148 lb ( 67.4 kg) Current tobacco use? No     Pt quit tobacco use on 04/06/11 Food/Drug Interaction? No Labs:  Lipid Panel     Component Value Date/Time   CHOL 111 06/11/2011 1106   TRIG 162.0* 06/11/2011 1106   HDL 35.50* 06/11/2011 1106   CHOLHDL 3 06/11/2011 1106   VLDL 32.4 06/11/2011 1106   LDLCALC 43 06/11/2011 1106   Lab Results  Component Value Date   HGBA1C 6.2 06/11/2011   05/12/11 Glucose 177  LDL goal: < 70      MI, DM and > 2:      HTN, HDL, family h/o, > 64 yo male Estimated Daily Nutrition Needs for: ? wt maintenance 2100-2300 Kcal , Total Fat 70-75gm, Saturated Fat 16-17 gm, Trans Fat 2.3-2.5 gm,  Sodium less than 1500 mg, Grams of CHO 250-325

## 2011-06-18 ENCOUNTER — Encounter: Payer: Self-pay | Admitting: Cardiovascular Disease

## 2011-06-18 ENCOUNTER — Ambulatory Visit (INDEPENDENT_AMBULATORY_CARE_PROVIDER_SITE_OTHER): Payer: 59 | Admitting: Cardiovascular Disease

## 2011-06-18 ENCOUNTER — Encounter (HOSPITAL_COMMUNITY): Payer: 59

## 2011-06-18 VITALS — BP 114/64 | HR 81 | Ht 67.0 in | Wt 150.2 lb

## 2011-06-18 DIAGNOSIS — I2589 Other forms of chronic ischemic heart disease: Secondary | ICD-10-CM

## 2011-06-18 DIAGNOSIS — A0472 Enterocolitis due to Clostridium difficile, not specified as recurrent: Secondary | ICD-10-CM

## 2011-06-18 DIAGNOSIS — I255 Ischemic cardiomyopathy: Secondary | ICD-10-CM

## 2011-06-18 DIAGNOSIS — I2510 Atherosclerotic heart disease of native coronary artery without angina pectoris: Secondary | ICD-10-CM

## 2011-06-18 DIAGNOSIS — I10 Essential (primary) hypertension: Secondary | ICD-10-CM

## 2011-06-18 DIAGNOSIS — I509 Heart failure, unspecified: Secondary | ICD-10-CM

## 2011-06-18 LAB — BASIC METABOLIC PANEL
CO2: 26 mEq/L (ref 19–32)
Chloride: 107 mEq/L (ref 96–112)
Creatinine, Ser: 1.3 mg/dL (ref 0.4–1.5)

## 2011-06-18 NOTE — Patient Instructions (Signed)
Your physician recommends that you schedule a follow-up appointment in: 3 MONTHS   Your physician recommends that you return for lab work in: TODAY AND IN 3 MONTHS BMP

## 2011-06-18 NOTE — Assessment & Plan Note (Addendum)
He's not having episodes of angina.  He's doing well following his coronary artery bypass grafting and he seems to be healing up very well. He still has the usual amount of soreness.

## 2011-06-18 NOTE — Assessment & Plan Note (Signed)
I was hoping  that we would be able to start him on an  ACE inhibitor at today's visit. He still feeling fairly puny. 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  We'll see him again in 3 months. We'll consider adding an ACE inhibitor at that time.

## 2011-06-18 NOTE — Progress Notes (Signed)
Kurt Richard Date of Birth  [birth year] [facility]     [place] Office  [address]    [address]   [address] [place], [place]  [address]    [place], [place]  [address] [phone]  Fax  [phone]  [phone]  Fax [phone]  Problem list: 1. Acute on chronic systolic CHF  2. Recent diarrhea, on Flagyl for presumed C diff  3. Acute renal insufficiency, d/c Cr 1.54  4. CAD without evidence of ischemia this admission  - recent late-presenting anterior MI 04/2011 with 3V dz s/p CABG  - postop course 04/2011 complicated by LV dysfunction requiring IABP/inotropes, ABL anemia, CVA  Secondary Discharge Diagnoses:  1. 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 Diabetes mellitus  3. Tobacco abuse  4. hypertension 5. hyperlipidemia 5. hyperlipidemia  History of Present Illness:  Kurt Richard is a 66 year old gentleman with the above-noted medical history. He status post recent coronary artery bypass grafting on March 5. He developed C. difficile colitis. He was admitted several weeks ago with volume overload. This occurred when we held his Lasix because of his profuse diarrhea. He is now been restarted on his medications and is feeling quite a bit better.  He still somewhat limited but is overall gradually regaining his strength.  His diet is gradually improving. He still not sleeping at night. He has had a postnasal drip since his surgery.  He continues to struggle with his C.Diff diarrhea. He is making slow progress.  Current Outpatient Prescriptions on File Prior to Visit  Medication Sig Dispense Refill  . aspirin 325 MG EC tablet Take 325 mg by mouth daily.      [name] atorvastatin (LIPITOR) 80 MG tablet Take 1 tablet (80 mg total) by mouth daily at 6 PM.  30 tablet  5  . diphenhydrAMINE (BENADRYL) 25 mg capsule Take 25 mg by mouth daily. For runny nose      . furosemide (LASIX) 40 MG tablet Take 40 mg by mouth daily.       [name] linagliptin (TRADJENTA) 5 MG TABS tablet Take 1 tablet  (5 mg total) by mouth daily.  30 tablet  11  . metoprolol succinate (TOPROL-XL) 50 MG 24 hr tablet Take 1 tablet (50 mg total) by mouth 2 (two) times daily.  60 tablet  5  . nitroGLYCERIN (NITROSTAT) 0.4 MG SL tablet Place 0.4 mg under the tongue every 5 (five) minutes as needed. For chest pain      . oxycodone (OXY-IR) 5 MG capsule Take 5 mg by mouth every 4 (four) hours as needed. For pain      . vancomycin (VANCOCIN) 125 MG capsule Take 1 capsule (125 mg total) by mouth 4 (four) times daily.  40 capsule  1  . fluticasone (FLONASE) 50 MCG/ACT nasal spray Place 2 sprays into the nose daily.  16 g  6    Allergies  Allergen Reactions  . Diltiazem Hcl Hives and Swelling  . Nifedipine Hives and Swelling    Past Medical History  Diagnosis Date  . Diabetes mellitus   . Hypertension   . Chronic systolic heart failure     echo 04/12/11: Mild LVH, EF 30-35%, mid to distal anteroseptal and apical hypokinesis, grade 2 diastolic dysfunction, moderate LAE, mild RAE.  [name] Hyperlipidemia   . Tobacco abuse   . CAD (coronary artery disease)     acute anterior STEMI, late presentation 04/06/11 LHC 3/5 demonstrated severe ostial left main 95% stenosis and otherwise three-vessel  CAD with an ejection fraction of 15%.  Emergent CABG: Dr. Dorris Fetch - Grafts: LIMA-LAD, SVG-D1, SVG-OM1, SVG-PDA and PL.  . Ischemic cardiomyopathy   . DM2 (diabetes mellitus, type 2)   . 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 Acute renal insufficiency     05/2011 admission (cr 1.54 at discharge)  . Anemia     Post-CABG    Past Surgical History  Procedure Date  . Coronary artery bypass graft 04/06/2011    Procedure: CORONARY ARTERY BYPASS GRAFTING (CABG);  Surgeon: Loreli Slot, MD;  Location: [facility] OR;  Service: Open Heart Surgery;  Laterality: N/A;  Coronary Artery Bypass Graft times four utilizing the left internal mammary artery and the Right and left  greater Saphenous veins Harvested endoscopically.  . Fibroid  bypass 04/06/11     History  Smoking status  . Former Smoker -- 0.5 packs/day  . Types: Cigarettes  . Quit date: 04/06/2011  Smokeless tobacco  . Not on file    History  Alcohol Use No    Family History  Problem Relation Age of Onset  . Heart attack Mother     ?48s  . Heart attack Sister     86s    Reviw of Systems:  Reviewed in the HPI.  All other systems are negative.  Physical Exam: Blood pressure 114/64, pulse 81, height 5\' 7"  (1.702 m), weight 150 lb 3.2 oz (68.13 kg). General: Well developed, well nourished, in no acute distress.  Head: Normocephalic, atraumatic, sclera non-icteric, mucus membranes are moist,   Neck: Supple. Carotids are 2 + without bruits. No JVD  Lungs: Clear bilaterally to auscultation.  Heart: regular rate.  normal  S1 S2. No murmurs, gallops or rubs.  He had a small suture in the middle of his sternotomy scar. It was bathed with Betadine and fell off as I was wiping down the wound.  There is no evidence of infection.  Abdomen: Soft, non-tender, non-distended with normal bowel sounds. No hepatomegaly. No rebound/guarding. No masses.  Msk:  Strength and tone are normal  Extremities: No clubbing or cyanosis. No edema.  Distal pedal pulses are 2+ and equal bilaterally.  Neuro: Alert and oriented X 3. Moves all extremities spontaneously.  Psych:  Responds to questions appropriately with a normal affect.  ECG:  Assessment / Plan:

## 2011-06-21 ENCOUNTER — Other Ambulatory Visit: Payer: Self-pay | Admitting: *Deleted

## 2011-06-21 ENCOUNTER — Encounter (HOSPITAL_COMMUNITY)
Admission: RE | Admit: 2011-06-21 | Discharge: 2011-06-21 | Disposition: A | Discharge status: Home / Self Care | Payer: 59 | Source: Ambulatory Visit | Attending: Cardiovascular Disease | Admitting: Cardiovascular Disease

## 2011-06-21 DIAGNOSIS — E875 Hyperkalemia: Secondary | ICD-10-CM

## 2011-06-21 DIAGNOSIS — I509 Heart failure, unspecified: Secondary | ICD-10-CM

## 2011-06-21 LAB — GLUCOSE, CAPILLARY
Glucose-Capillary: 174 mg/dL — ABNORMAL HIGH (ref 70–99)
Glucose-Capillary: 174 mg/dL — ABNORMAL HIGH (ref 70–99)

## 2011-06-21 NOTE — Progress Notes (Signed)
Talked with patient and significant other regarding the reocurrence  of diarrhea stool last night.  Up until last night pt had gone three days with no loose stool.   Pt drank a milkshake last night and he had not had one for several weeks. Pt will see Dr. Dorris Fetch on tomorrow and plans to talk with him regarding diarrhea. Reviewed rehab policy regarding participation and symptoms of diarrhea.  Pt verbalized understanding of policy.

## 2011-06-21 NOTE — Progress Notes (Signed)
i mo lab draw set/ pt not to return for 3 months, ordered bmet and bnp.

## 2011-06-22 ENCOUNTER — Encounter: Payer: Self-pay | Admitting: Thoracic Surgery (Cardiothoracic Vascular Surgery)

## 2011-06-22 ENCOUNTER — Ambulatory Visit (INDEPENDENT_AMBULATORY_CARE_PROVIDER_SITE_OTHER): Payer: Self-pay | Admitting: Thoracic Surgery (Cardiothoracic Vascular Surgery)

## 2011-06-22 VITALS — BP 98/61 | HR 76 | Resp 18 | Ht 67.0 in | Wt 149.0 lb

## 2011-06-22 DIAGNOSIS — I509 Heart failure, unspecified: Secondary | ICD-10-CM

## 2011-06-22 DIAGNOSIS — A0472 Enterocolitis due to Clostridium difficile, not specified as recurrent: Secondary | ICD-10-CM

## 2011-06-22 DIAGNOSIS — Z951 Presence of aortocoronary bypass graft: Secondary | ICD-10-CM

## 2011-06-22 DIAGNOSIS — I2510 Atherosclerotic heart disease of native coronary artery without angina pectoris: Secondary | ICD-10-CM

## 2011-06-22 NOTE — Progress Notes (Signed)
HPI:  Kurt Richard returns for a scheduled followup visit. He had emergency coronary bypass grafting on March 5. He presented with acute MI and a critical left main disease, he required an intra-aortic balloon pump. He has had a relatively slow recovery, primarily due to C. difficile colitis. He had severe diarrhea over prolonged period of time. It was not responsive to Flagyl. He ultimately was started on PO vancomycin which finally get his diarrhea under control. He states that he had some diarrhea 3 days ago but has had none in the past 2 days. Today is his last day of the vancomycin.  Past Medical History  Diagnosis Date  . Diabetes mellitus   . Hypertension   . Chronic systolic heart failure     echo 04/12/11: Mild LVH, EF 30-35%, mid to distal anteroseptal and apical hypokinesis, grade 2 diastolic dysfunction, moderate LAE, mild RAE.  [name] Hyperlipidemia   . Tobacco abuse   . CAD (coronary artery disease)     acute anterior STEMI, late presentation 04/06/11 LHC 3/5 demonstrated severe ostial left main 95% stenosis and otherwise three-vessel CAD with an ejection fraction of 15%.  Emergent CABG: Dr. Dorris Fetch - Grafts: LIMA-LAD, SVG-D1, SVG-OM1, SVG-PDA and PL.  . Ischemic cardiomyopathy   . DM2 (diabetes mellitus, type 2)   . 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 Acute renal insufficiency     05/2011 admission (cr 1.54 at discharge)  . Anemia     Post-CABG      Current Outpatient Prescriptions  Medication Sig Dispense Refill  . aspirin 325 MG EC tablet Take 325 mg by mouth daily.      [name] atorvastatin (LIPITOR) 80 MG tablet Take 1 tablet (80 mg total) by mouth daily at 6 PM.  30 tablet  5  . diphenhydrAMINE (BENADRYL) 25 mg capsule Take 25 mg by mouth daily. For runny nose      . fluticasone (FLONASE) 50 MCG/ACT nasal spray Place 2 sprays into the nose daily.  16 g  6  . furosemide (LASIX) 40 MG tablet Take 40 mg by mouth daily.       [name] linagliptin (TRADJENTA) 5 MG TABS tablet  Take 1 tablet (5 mg total) by mouth daily.  30 tablet  11  . metoprolol succinate (TOPROL-XL) 50 MG 24 hr tablet Take 1 tablet (50 mg total) by mouth 2 (two) times daily.  60 tablet  5  . nitroGLYCERIN (NITROSTAT) 0.4 MG SL tablet Place 0.4 mg under the tongue every 5 (five) minutes as needed. For chest pain      . oxycodone (OXY-IR) 5 MG capsule Take 5 mg by mouth every 4 (four) hours as needed. For pain      . vancomycin (VANCOCIN) 125 MG capsule Take 1 capsule (125 mg total) by mouth 4 (four) times daily.  40 capsule  1    Physical Exam BP 98/61  Pulse 76  Resp 18  Ht 5\' 7"  (1.702 m)  Wt 149 lb (67.586 kg)  BMI 23.34 kg/m2  SpO8 73% 66 year old gentleman in no acute distress Lungs clear with equal breath sounds bilaterally Cardiac regular rate and rhythm normal S1 and S2 Sternum stable, incision clean dry and intact No peripheral edema Right leg wound small area where scab was removed, no evidence of infection  Diagnostic Tests: Chest x-ray shows a small residual left effusion and associated atelectasis  Impression: 66 year old gentleman status post emergent coronary bypass grafting about 10 weeks ago. Progress has  been limited by severe C. difficile colitis and diarrhea. That finally seems to be under control. He did have some lab work done by Dr. Elease Hashimoto recently, showed a potassium of 5.5. Creatinine was 1.3. His BNP was elevated at 1700. He's had problems with a low potassium diet, because it eliminates a lot of the foods that he likes.  Plan: Slow recovery from cardiac surgery. He does seem to be on a good trajectory at this point and has started cardiac rehabilitation.  I'll be happy to see Kurt Richard back any time in the future if I can be of any further assistance with his care.

## 2011-06-23 ENCOUNTER — Encounter (HOSPITAL_COMMUNITY)
Admission: RE | Admit: 2011-06-23 | Discharge: 2011-06-23 | Payer: 59 | Source: Ambulatory Visit | Attending: Cardiovascular Disease | Admitting: Cardiovascular Disease

## 2011-06-23 NOTE — Progress Notes (Signed)
Nutrition Note Lab Results  Component Value Date   K 5.5* 06/18/2011   Lab Results  Component Value Date   BUN 36* 06/18/2011   Spoke with pt caregiver, Kurt Richard. Pt out of rehab today due to "being up all night with diarrhea." Pt took his last ABT last night. Per Kurt Richard, GI consult pending. Pt reportedly received a call from Dr. Elease Hashimoto re: elevated potassium. Elevated BUN noted. Pt caregiver instructed over the phone by this writer re: low potassium and high potassium foods. Pt caregiver given the on-line resource of kidney.org for further information. Pt was drinking Gatorade due to chronic diarrhea. Gatorade has been stopped. Pt has switched from potatoes to rice or pasta. Pt caregiver expressed understanding of information provided. Continue client-centered nutrition education by RD as part of interdisciplinary care.  Monitor and evaluate progress toward nutrition goal with team.

## 2011-06-25 ENCOUNTER — Telehealth: Payer: Self-pay | Admitting: *Deleted

## 2011-06-25 ENCOUNTER — Encounter (HOSPITAL_COMMUNITY): Admission: RE | Admit: 2011-06-25 | Payer: 59 | Source: Ambulatory Visit

## 2011-06-25 DIAGNOSIS — R197 Diarrhea, unspecified: Secondary | ICD-10-CM

## 2011-06-25 DIAGNOSIS — A0472 Enterocolitis due to Clostridium difficile, not specified as recurrent: Secondary | ICD-10-CM

## 2011-06-25 NOTE — Telephone Encounter (Signed)
Yes.  I think this would be reasonable at this time.  He has been on both flagyl and vancomycin.  Go ahead and set up referral

## 2011-06-25 NOTE — Telephone Encounter (Signed)
Left message on machine for Kurt Richard and referral sent.

## 2011-06-25 NOTE — Telephone Encounter (Signed)
Patient has had diarrhea since coming out of the hospital in March.  He has taken 2 different ATB with no relief.  He would like to know if he can have a referral to GI?  Please call his caregiver at [phone] okay to leave a message.

## 2011-06-30 ENCOUNTER — Encounter (HOSPITAL_COMMUNITY)
Admission: RE | Admit: 2011-06-30 | Discharge: 2011-06-30 | Disposition: A | Discharge status: Home / Self Care | Payer: 59 | Source: Ambulatory Visit | Attending: Cardiovascular Disease | Admitting: Cardiovascular Disease

## 2011-07-01 ENCOUNTER — Telehealth: Payer: Self-pay | Admitting: Gastroenterology

## 2011-07-01 NOTE — Telephone Encounter (Signed)
I spoke with the patient's son patient with several months of diarrhea.  He was treated for c-diff.  I see no lab work in the system.  The son reports that no stool studies have been performed on him.  He will come in and see Willette Cluster RNP tomorrow at 9:30

## 2011-07-02 ENCOUNTER — Ambulatory Visit (INDEPENDENT_AMBULATORY_CARE_PROVIDER_SITE_OTHER): Payer: 59 | Admitting: Nurse Practitioner

## 2011-07-02 ENCOUNTER — Encounter: Payer: Self-pay | Admitting: Nurse Practitioner

## 2011-07-02 ENCOUNTER — Encounter (HOSPITAL_COMMUNITY): Payer: 59

## 2011-07-02 VITALS — BP 108/60 | HR 80 | Ht 68.0 in | Wt 144.4 lb

## 2011-07-02 DIAGNOSIS — R197 Diarrhea, unspecified: Secondary | ICD-10-CM

## 2011-07-02 MED ORDER — DIPHENOXYLATE-ATROPINE 2.5-0.025 MG PO TABS: ORAL_TABLET | ORAL | Status: DC

## 2011-07-02 MED ORDER — MOVIPREP 100 G PO SOLR: ORAL | Status: DC

## 2011-07-02 NOTE — Patient Instructions (Addendum)
We have 2 prescriptions for Moviprep, ( colonoscopy prep) and Lomotil for diarrhea. We scheduled the Colonoscopy with Dr. Rob Bunting on 07-09-2011. Directions provided.  Please go to the basement level to have your labs drawn.    .Colonoscopy A colonoscopy is an exam to evaluate your entire colon. In this exam, your colon is cleansed. A long fiberoptic tube is inserted through your rectum and into your colon. The fiberoptic scope (endoscope) is a long bundle of enclosed and very flexible fibers. These fibers transmit light to the area examined and send images from that area to your caregiver. Discomfort is usually minimal. You may be given a drug to help you sleep (sedative) during or prior to the procedure. This exam helps to detect lumps (tumors), polyps, inflammation, and areas of bleeding. Your caregiver may also take a small piece of tissue (biopsy) that will be examined under a microscope. LET YOUR CAREGIVER KNOW ABOUT:   Allergies to food or medicine.   Medicines taken, including vitamins, herbs, eyedrops, over-the-counter medicines, and creams.   Use of steroids (by mouth or creams).   Previous problems with anesthetics or numbing medicines.   History of bleeding problems or blood clots.   Previous surgery.   Other health problems, including diabetes and kidney problems.   Possibility of pregnancy, if this applies.  BEFORE THE PROCEDURE   A clear liquid diet may be required for 2 days before the exam.   Ask your caregiver about changing or stopping your regular medications.   Liquid injections (enemas) or laxatives may be required.   A large amount of electrolyte solution may be given to you to drink over a short period of time. This solution is used to clean out your colon.   You should be present 60 minutes prior to your procedure or as directed by your caregiver.  AFTER THE PROCEDURE   If you received a sedative or pain relieving medication, you will need to arrange  for someone to drive you home.   Occasionally, there is a little blood passed with the first bowel movement. Do not be concerned.  FINDING OUT THE RESULTS OF YOUR TEST Not all test results are available during your visit. If your test results are not back during the visit, make an appointment with your caregiver to find out the results. Do not assume everything is normal if you have not heard from your caregiver or the medical facility. It is important for you to follow up on all of your test results. HOME CARE INSTRUCTIONS   It is not unusual to pass moderate amounts of gas and experience mild abdominal cramping following the procedure. This is due to air being used to inflate your colon during the exam. Walking or a warm pack on your belly (abdomen) may help.   You may resume all normal meals and activities after sedatives and medicines have worn off.   Only take over-the-counter or prescription medicines for pain, discomfort, or fever as directed by your caregiver. Do not use aspirin or blood thinners if a biopsy was taken. Consult your caregiver for medicine usage if biopsies were taken.  SEEK IMMEDIATE MEDICAL CARE IF:   You have a fever.   You pass large blood clots or fill a toilet with blood following the procedure. This may also occur 10 to 14 days following the procedure. This is more likely if a biopsy was taken.   You develop abdominal pain that keeps getting worse and cannot be relieved with medicine.  Document Released: 01/16/2000 Document Revised: 01/07/2011 Document Reviewed: 08/31/2007 .

## 2011-07-02 NOTE — Progress Notes (Signed)
07/04/2011 Kurt Richard [id number] 13-Dec-1945   HISTORY OF PRESENT ILLNESS: Patient is a 66 year old male with multiple medical problems. He was hospitalized March of this year for an acute MI and underwent CABG.  Two days after discharge patient began having diarrhea and went to ED for evaluation. Patient was given Flagyl. No stool samples were done. Diarrhea improved with course of Flagyl but then recurred a couple of days later. Patient established care with  Dr. Caryl Never who treated him with another 14 days of Flagyl.  No improvement despite second round of Flagyl. Then PCP started Vancomycin 125mg  qid on May 6th which he took for 10 days without any improvement. Patient having anywhere from 5 to 20 or so loose stools a day. Stools watery, malodorous. No abdominal pain or nausea. No blood. All of his medications were new since hospital discharge. Lipitor has been known to cause diarrhea in some people. He was on Glucophage but had to change to Tradjenta because of diarrhea.  Patient has lost 150 pounds in last 1.5 years. His partner has lost 100 pounds. Weight loss not intentional though no indications of any health problems.     Past Medical History  Diagnosis Date  . Diabetes mellitus   . Hypertension   . Chronic systolic heart failure     echo 04/12/11: Mild LVH, EF 30-35%, mid to distal anteroseptal and apical hypokinesis, grade 2 diastolic dysfunction, moderate LAE, mild RAE.  [name] Hyperlipidemia   . Tobacco abuse   . CAD (coronary artery disease)     acute anterior STEMI, late presentation 04/06/11 LHC 3/5 demonstrated severe ostial left main 95% stenosis and otherwise three-vessel CAD with an ejection fraction of 15%.  Emergent CABG: Dr. Dorris Fetch - Grafts: LIMA-LAD, SVG-D1, SVG-OM1, SVG-PDA and PL.  . Ischemic cardiomyopathy   . DM2 (diabetes mellitus, type 2)   . 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 Acute renal insufficiency     05/2011 admission (cr 1.54 at discharge)  .  Anemia     Post-CABG  . Arthritis   . Stroke   . Sleep apnea    Past Surgical History  Procedure Date  . Coronary artery bypass graft 04/06/2011    Procedure: CORONARY ARTERY BYPASS GRAFTING (CABG);  Surgeon: Loreli Slot, MD;  Location: [facility] OR;  Service: Open Heart Surgery;  Laterality: N/A;  Coronary Artery Bypass Graft times four utilizing the left internal mammary artery and the Right and left  greater Saphenous veins Harvested endoscopically.  . Fibroid bypass 04/06/11   . Colon surgery     reports that he quit smoking about 2 months ago. His smoking use included Cigarettes. He smoked .5 packs per day. He has never used smokeless tobacco. He reports that he does not drink alcohol or use illicit drugs. family history includes Diabetes in his mother and sister and Heart attack in his mother and sister. Allergies  Allergen Reactions  . Diltiazem Hcl Hives and Swelling  . Nifedipine Hives and Swelling      Outpatient Encounter Prescriptions as of 07/02/2011  Medication Sig Dispense Refill  . aspirin 325 MG EC tablet Take 325 mg by mouth daily.      [name] atorvastatin (LIPITOR) 80 MG tablet Take 1 tablet (80 mg total) by mouth daily at 6 PM.  30 tablet  5  . fluticasone (FLONASE) 50 MCG/ACT nasal spray Place 2 sprays into the nose daily.  16 g  6  . furosemide (LASIX)  40 MG tablet Take 40 mg by mouth daily.       [name] linagliptin (TRADJENTA) 5 MG TABS tablet Take 1 tablet (5 mg total) by mouth daily.  30 tablet  11  . metoprolol succinate (TOPROL-XL) 50 MG 24 hr tablet Take 1 tablet (50 mg total) by mouth 2 (two) times daily.  60 tablet  5  . nitroGLYCERIN (NITROSTAT) 0.4 MG SL tablet Place 0.4 mg under the tongue every 5 (five) minutes as needed. For chest pain      . oxycodone (OXY-IR) 5 MG capsule Take 5 mg by mouth every 4 (four) hours as needed. For pain      . diphenhydrAMINE (BENADRYL) 25 mg capsule Take 25 mg by mouth daily. For runny nose      . diphenoxylate-atropine (LOMOTIL)  2.5-0.025 MG per tablet Take 1 tab twice daily .  60 tablet  0  . MOVIPREP 100 G SOLR Take as directed.  Moviprep  1 kit  0  . DISCONTD: diphenoxylate-atropine (LOMOTIL) 2.5-0.025 MG per tablet Take 1 tab twice daily .  60 tablet  0     REVIEW OF SYSTEMS  : All other systems reviewed and negative except where noted in the History of Present Illness.   PHYSICAL EXAM: BP 108/60  Pulse 80  Ht 5\' 8"  (1.727 m)  Wt 144 lb 6.4 oz (65.499 kg)  BMI 21.96 kg/m2  SpO2 99% General: Well developed white male in no acute distress Head: Normocephalic and atraumatic Eyes:  sclerae anicteric,conjunctive pink. Ears: Normal auditory acuity Neck: Supple, no masses.  Lungs: Clear throughout to auscultation Heart: Regular rate and rhythm Abdomen: Soft, non distended, nontender. No masses or hepatomegaly noted. Normal Bowel sounds Rectal: not done Musculoskeletal: Symmetrical with no gross deformities  Skin: No lesions on visible extremities Extremities: No edema or deformities noted Neurological: Alert oriented, grossly nonfocal Cervical Nodes:  No significant cervical adenopathy Psychological:  Alert and cooperative. Normal mood and affect  ASSESSMENT AND PLAN;  23. 66 year old male with sub-acute diarrhea. He is having 5-20 loose BMs a day, almost on a daily basis.  Diarrhea persists despite 2 courses of Flagyl and a 10 day course of Vancomycin. Infectious diarrhea still possibility. Need to obtain stool for C-Diff, C&S, O +P. If positive will treat accordingly.  Given  his co-morbidities, patient is at increased risk for procedures but does need further evaluation of his diarrhea. The risks, benefits, and alternatives to colonoscopy with possible biopsy and possible polypectomy were discussed with the patient and he consents to proceed. Can use Lomotil as needed.   2. CAD, CABG in March 3013  3. Diabetes  4. Peripheral neuropathy

## 2011-07-04 ENCOUNTER — Encounter: Payer: Self-pay | Admitting: Nurse Practitioner

## 2011-07-05 ENCOUNTER — Encounter (HOSPITAL_COMMUNITY): Payer: 59

## 2011-07-05 ENCOUNTER — Other Ambulatory Visit: Payer: 59

## 2011-07-05 DIAGNOSIS — R197 Diarrhea, unspecified: Secondary | ICD-10-CM | POA: Insufficient documentation

## 2011-07-05 NOTE — Progress Notes (Signed)
I agree with the plan outlined in this note 

## 2011-07-06 ENCOUNTER — Other Ambulatory Visit: Payer: 59

## 2011-07-06 DIAGNOSIS — R197 Diarrhea, unspecified: Secondary | ICD-10-CM

## 2011-07-07 ENCOUNTER — Telehealth: Payer: Self-pay | Admitting: Nurse Practitioner

## 2011-07-07 ENCOUNTER — Telehealth: Payer: Self-pay | Admitting: *Deleted

## 2011-07-07 ENCOUNTER — Telehealth (HOSPITAL_COMMUNITY): Payer: Self-pay | Admitting: *Deleted

## 2011-07-07 ENCOUNTER — Encounter (HOSPITAL_COMMUNITY): Payer: 59

## 2011-07-07 ENCOUNTER — Other Ambulatory Visit: Payer: Self-pay | Admitting: *Deleted

## 2011-07-07 NOTE — Telephone Encounter (Signed)
Advised the pt I had to cancel the colonoscopy for now.  I advised him the C Diff test result was positive and he will need further treatment with Vancomycin.  I called [facility] but they do not have the suspension.  I asked [name], the pt's partner if we could call [facility] and he said that was fine.  I did call [facility], spoke to [place] pharmacist, and called her in the tapered dose of Vancomycin 125 mg ( dose of 5 mL).  Jasmine December said this will be compounded tomorrow AM.  I told [name] that he will get a call for Kurt Richard ( patient)  When the prescription is ready.   5mL QID x 14 days 5 mL BID x 7 days 5 mL daily x 7 days 5 mL QOD x 7 days 5 ML every 3 days for 14 days.

## 2011-07-07 NOTE — Telephone Encounter (Signed)
Pt called out due to continued diarrhea stools.  Pt to have GI testing on Friday.

## 2011-07-08 LAB — OVA AND PARASITE SCREEN: OP: NONE SEEN

## 2011-07-09 ENCOUNTER — Encounter: Payer: 59 | Admitting: Gastroenterology

## 2011-07-09 ENCOUNTER — Encounter (HOSPITAL_COMMUNITY): Payer: 59

## 2011-07-09 MED ORDER — AMBULATORY NON FORMULARY MEDICATION: 5.0000 mL | Status: DC

## 2011-07-09 NOTE — Progress Notes (Signed)
Addended byLowry Ram K on: 07/09/2011 09:07 AM   Modules accepted: Orders

## 2011-07-10 LAB — STOOL CULTURE

## 2011-07-12 ENCOUNTER — Telehealth (HOSPITAL_COMMUNITY): Payer: Self-pay | Admitting: *Deleted

## 2011-07-12 ENCOUNTER — Encounter (HOSPITAL_COMMUNITY): Payer: 59

## 2011-07-12 NOTE — Telephone Encounter (Signed)
Received message that pt would be absent today due to ongoing elimination issues.   Reminded of cardiac rehab policy in regards to GI distress.  Pt must be symptom free for 48 hours before returning to rehab.

## 2011-07-14 ENCOUNTER — Encounter (HOSPITAL_COMMUNITY): Payer: 59

## 2011-07-14 NOTE — Telephone Encounter (Signed)
Completed.

## 2011-07-16 ENCOUNTER — Encounter (HOSPITAL_COMMUNITY)
Admission: RE | Admit: 2011-07-16 | Discharge: 2011-07-16 | Disposition: A | Discharge status: Home / Self Care | Payer: 59 | Source: Ambulatory Visit | Attending: Cardiovascular Disease | Admitting: Cardiovascular Disease

## 2011-07-16 DIAGNOSIS — E119 Type 2 diabetes mellitus without complications: Secondary | ICD-10-CM | POA: Insufficient documentation

## 2011-07-16 DIAGNOSIS — Z5189 Encounter for other specified aftercare: Secondary | ICD-10-CM | POA: Insufficient documentation

## 2011-07-16 DIAGNOSIS — E785 Hyperlipidemia, unspecified: Secondary | ICD-10-CM | POA: Insufficient documentation

## 2011-07-16 DIAGNOSIS — I509 Heart failure, unspecified: Secondary | ICD-10-CM | POA: Insufficient documentation

## 2011-07-16 DIAGNOSIS — F17200 Nicotine dependence, unspecified, uncomplicated: Secondary | ICD-10-CM | POA: Insufficient documentation

## 2011-07-16 DIAGNOSIS — I252 Old myocardial infarction: Secondary | ICD-10-CM | POA: Insufficient documentation

## 2011-07-16 DIAGNOSIS — Z951 Presence of aortocoronary bypass graft: Secondary | ICD-10-CM | POA: Insufficient documentation

## 2011-07-16 DIAGNOSIS — I2510 Atherosclerotic heart disease of native coronary artery without angina pectoris: Secondary | ICD-10-CM | POA: Insufficient documentation

## 2011-07-16 NOTE — Progress Notes (Addendum)
Kurt Richard is taking Vancomycin 125 mg 4 times a day for 2 weeks and then one tablet for two weeks. CBG 161.  Kurt Richard said he was told he has C-diff. Will continue to use precautions with use of bleach on equipment and good handwashing techniques. Kurt Richard has not had diarrhea for 21/2 days. Weight is 64.7 kg today.  Kurt Richard was 67.4kg on 06/30/11. No complaints with exercise today. Will continue to monitor the patient throughout  the program. Kurt Richard's weight was 65.4 kg at Dr. Larae Grooms office.

## 2011-07-19 ENCOUNTER — Encounter (HOSPITAL_COMMUNITY)
Admission: RE | Admit: 2011-07-19 | Discharge: 2011-07-19 | Disposition: A | Discharge status: Home / Self Care | Payer: 59 | Source: Ambulatory Visit | Attending: Cardiovascular Disease | Admitting: Cardiovascular Disease

## 2011-07-19 ENCOUNTER — Other Ambulatory Visit (INDEPENDENT_AMBULATORY_CARE_PROVIDER_SITE_OTHER): Payer: 59

## 2011-07-19 DIAGNOSIS — I509 Heart failure, unspecified: Secondary | ICD-10-CM

## 2011-07-19 DIAGNOSIS — E875 Hyperkalemia: Secondary | ICD-10-CM

## 2011-07-19 LAB — BASIC METABOLIC PANEL
BUN: 69 mg/dL — ABNORMAL HIGH (ref 6–23)
CO2: 20 mEq/L (ref 19–32)
Chloride: 112 mEq/L (ref 96–112)
Glucose, Bld: 139 mg/dL — ABNORMAL HIGH (ref 70–99)
Potassium: 5.1 mEq/L (ref 3.5–5.1)
Sodium: 141 mEq/L (ref 135–145)

## 2011-07-19 NOTE — Progress Notes (Signed)
Reviewed home exercise with pt today.  Pt plans to walk and possibly use recumbent bike at home for exercise.  Reviewed THR, pulse, RPE, sign and symptoms, and when to call 911 or MD.  Pt voiced understanding. Fabio Pierce, MA, ACSM RCEP

## 2011-07-20 ENCOUNTER — Telehealth: Payer: Self-pay | Admitting: Nurse Practitioner

## 2011-07-20 ENCOUNTER — Telehealth: Payer: Self-pay | Admitting: *Deleted

## 2011-07-20 NOTE — Telephone Encounter (Signed)
Spoke with caregiver and gave him the information on lab note from Dr. Christella Hartigan. He also states patient has not had improvement  From diarrhea since starting on the Vancomycin. States he may go one day without diarrhea then it keeps him up all night. He is wanting to know when this should get better. Please, advise.

## 2011-07-20 NOTE — Telephone Encounter (Signed)
Will route to Dr Elease Hashimoto, see Dr Christella Hartigan note/ attached, 1 month lab work app set, Do you need to adjust any of his meds?

## 2011-07-20 NOTE — Telephone Encounter (Signed)
Message copied by Antony Odea on Tue Jul 20, 2011  1:05 PM ------      Message from: Rachael Fee      Created: Tue Jul 20, 2011 12:53 PM       Oral vanc is very poorly absorbed, I doubt it is having any effect on his renal function.  Would not change dose for now.

## 2011-07-21 ENCOUNTER — Encounter (HOSPITAL_COMMUNITY)
Admission: RE | Admit: 2011-07-21 | Discharge: 2011-07-21 | Disposition: A | Discharge status: Home / Self Care | Payer: 59 | Source: Ambulatory Visit | Attending: Cardiovascular Disease | Admitting: Cardiovascular Disease

## 2011-07-21 NOTE — Progress Notes (Signed)
Noted low BP that responded well with water.  Pt's significant other reports that he feels that pt doesn't drink enough water throughout the day.  Pt ate toast and yogurt for breakfast. New Pac's  today with exercise.  Pt asymptotic with no complaints. Will scan ekg strip and rehab report to media manager for Dr. Elease Hashimoto to review.

## 2011-07-23 ENCOUNTER — Encounter (HOSPITAL_COMMUNITY)
Admission: RE | Admit: 2011-07-23 | Discharge: 2011-07-23 | Disposition: A | Discharge status: Home / Self Care | Payer: 59 | Source: Ambulatory Visit | Attending: Cardiovascular Disease | Admitting: Cardiovascular Disease

## 2011-07-23 NOTE — Telephone Encounter (Signed)
There could be other underlying reasons for diarrhea (in addition to C-diff). His colonoscopy was postponed after c-diff positive. He still needs a colon for screening and also if having persistent diarrhea. Would expect him to have gotten better by now if diarrhea all related to C diff. He can take Lomotil twice daily. Please make sure he has an office appointment in a couple of weeks (we can reschedule his colonoscopy at the time of that visit). thanks

## 2011-07-23 NOTE — Telephone Encounter (Signed)
Left a message for Kurt Richard to call me.

## 2011-07-26 ENCOUNTER — Encounter (HOSPITAL_COMMUNITY)
Admission: RE | Admit: 2011-07-26 | Discharge: 2011-07-26 | Payer: 59 | Source: Ambulatory Visit | Attending: Cardiovascular Disease | Admitting: Cardiovascular Disease

## 2011-07-26 NOTE — Telephone Encounter (Signed)
Scheduled patient on 6//26/13 at 11:15 AM.

## 2011-07-26 NOTE — Progress Notes (Signed)
[place] had diarrhea last night.  Advised patient not to exercise today. [place] will have an Md appointment on Wed for f/u with the GI doctor.

## 2011-07-26 NOTE — Telephone Encounter (Signed)
Spoke with Mr. Laural Benes and gave him the recommendations. He will call me back to schedule OV.

## 2011-07-26 NOTE — Progress Notes (Signed)
NO MEDS CHANGED

## 2011-07-28 ENCOUNTER — Encounter: Payer: Self-pay | Admitting: Gastroenterology

## 2011-07-28 ENCOUNTER — Ambulatory Visit (INDEPENDENT_AMBULATORY_CARE_PROVIDER_SITE_OTHER): Payer: 59 | Admitting: Gastroenterology

## 2011-07-28 ENCOUNTER — Encounter (HOSPITAL_COMMUNITY): Payer: 59

## 2011-07-28 VITALS — BP 126/72 | HR 60 | Ht 67.0 in | Wt 143.0 lb

## 2011-07-28 DIAGNOSIS — R197 Diarrhea, unspecified: Secondary | ICD-10-CM

## 2011-07-28 MED ORDER — FIDAXOMICIN 200 MG PO TABS: 200.0000 mg | ORAL_TABLET | Freq: Two times a day (BID) | ORAL | Status: DC

## 2011-07-28 MED ORDER — MOVIPREP 100 G PO SOLR: 1.0000 | ORAL | Status: DC

## 2011-07-28 NOTE — Patient Instructions (Addendum)
Stop the vancomycin. Start dificid, one pill twice daily. You will be set up for a colonoscopy at [facility] for diarrhea. Take one imodium every AM and one at bedtime. You will have labs checked today in the basement lab.  Please head down after you check out with the front desk  (stool tests C. Diff by PCR).

## 2011-07-28 NOTE — Progress Notes (Signed)
Review of pertinent gastrointestinal problems: 1. C. Diff positive by PCR June 2013. This was despite 2 courses of oral Flagyl and one course of vancomycin.   Was put on long tapering course of vancomycin  HPI: This is a  very pleasant 66-year-old man whom I am meeting for the first time today.   tap ering vancomycin, down to two doses a day.    Reviewing his diarrhea, it does not seem he has really adequately responded to Flagyl or vancomycin and I'm starting to wonder whether the C. difficile by PCR is a false positive result. I am going to treat him      Past Medical History  Diagnosis Date  . Diabetes mellitus   . Hypertension   . Chronic systolic heart failure     echo 04/12/11: Mild LVH, EF 30-35%, mid to distal anteroseptal and apical hypokinesis, grade 2 diastolic dysfunction, moderate LAE, mild RAE.  . Hyperlipidemia   . Tobacco abuse   . CAD (coronary artery disease)     acute anterior STEMI, late presentation 04/06/11 LHC 3/5 demonstrated severe ostial left main 95% stenosis and otherwise three-vessel CAD with an ejection fraction of 15%.  Emergent CABG: Dr. Hendrickson - Grafts: LIMA-LAD, SVG-D1, SVG-OM1, SVG-PDA and PL.  . Ischemic cardiomyopathy   . DM2 (diabetes mellitus, type 2)   . 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 Acute renal insufficiency     05/2011 admission (cr 1.54 at discharge)  . Anemia     Post-CABG  . Arthritis   . Stroke   . Sleep apnea     Past Surgical History  Procedure Date  . Coronary artery bypass graft 04/06/2011    Procedure: CORONARY ARTERY BYPASS GRAFTING (CABG);  Surgeon: Steven C Hendrickson, MD;  Location: [facility] OR;  Service: Open Heart Surgery;  Laterality: N/A;  Coronary Artery Bypass Graft times four utilizing the left internal mammary artery and the Right and left  greater Saphenous veins Harvested endoscopically.  . Fibroid bypass 04/06/11   . Colon surgery     Current Outpatient Prescriptions  Medication Sig Dispense Refill   . AMBULATORY NON FORMULARY MEDICATION Take 5 mLs by mouth as directed. Medication Name: Vancomycin 125 mg ( Dose is 5 mL) 5 mL 4 times day for 14 days 5 mL BID x 7 days 5 ml 1 daily x 7 days QOD x 7 days  5 ml every 3 days x 14 days  430 mL  0  . aspirin 325 MG EC tablet Take 325 mg by mouth daily.      . atorvastatin (LIPITOR) 80 MG tablet Take 1 tablet (80 mg total) by mouth daily at 6 PM.  30 tablet  5  . diphenhydrAMINE (BENADRYL) 25 mg capsule Take 25 mg by mouth daily. For runny nose      . diphenoxylate-atropine (LOMOTIL) 2.5-0.025 MG per tablet Take 1 tab twice daily .  60 tablet  0  . fluticasone (FLONASE) 50 MCG/ACT nasal spray Place 2 sprays into the nose daily.  16 g  6  . furosemide (LASIX) 40 MG tablet Take 40 mg by mouth daily.       . linagliptin (TRADJENTA) 5 MG TABS tablet Take 1 tablet (5 mg total) by mouth daily.  30 tablet  11  . metoprolol succinate (TOPROL-XL) 50 MG 24 hr tablet Take 1 tablet (50 mg total) by mouth 2 (two) times daily.  60 tablet  5  . nitroGLYCERIN (NITROSTAT) 0.4 MG   SL tablet Place 0.4 mg under the tongue every 5 (five) minutes as needed. For chest pain      . oxycodone (OXY-IR) 5 MG capsule Take 5 mg by mouth every 4 (four) hours as needed. For pain        Allergies as of 07/28/2011 - Review Complete 07/28/2011  Allergen Reaction Noted  . Diltiazem hcl Hives and Swelling 04/06/2011  . Nifedipine Hives and Swelling 04/06/2011    Family History  Problem Relation Age of Onset  . Heart attack Mother     ?50s  . Heart attack Sister     40s  . Diabetes Mother   . Diabetes Sister     [place]    History   Social History  . Marital Status: Single    Spouse Name: N/A    Number of Children: 0  . Years of Education: N/A   Occupational History  . Not on file.   Social History Main Topics  . Smoking status: Former Smoker -- 0.5 packs/day    Types: Cigarettes    Quit date: 04/06/2011  . Smokeless tobacco: Never Used  . Alcohol Use: No    . Drug Use: No  . Sexually Active: No   Other Topics Concern  . Not on file   Social History Narrative  . No narrative on file      Physical Exam: BP 126/72  Pulse 60  Ht 5' 7" (1.702 m)  Wt 143 lb (64.864 kg)  BMI 22.40 kg/m2 Constitutional: generally well-appearing Psychiatric: alert and oriented x3 Abdomen: soft, nontender, nondistended, no obvious ascites, no peritoneal signs, normal bowel sounds     Assessment and plan: 66 y.o. male with diarrhea   Diarrhea I am not convinced that he has Clostridium difficile associated diarrhea since he really is not responding to vancomycin much at all. He partial response and then the symptoms come raging back while he was still on vancomycin. I am changing him to  dificid for now and will proceed with colonoscopy tomorrow.  

## 2011-07-29 ENCOUNTER — Encounter (HOSPITAL_COMMUNITY): Payer: Self-pay | Admitting: *Deleted

## 2011-07-29 ENCOUNTER — Ambulatory Visit (HOSPITAL_COMMUNITY)
Admission: RE | Admit: 2011-07-29 | Discharge: 2011-07-29 | Disposition: A | Discharge status: Home / Self Care | Payer: 59 | Source: Ambulatory Visit | Attending: Gastroenterology | Admitting: Gastroenterology

## 2011-07-29 ENCOUNTER — Encounter (HOSPITAL_COMMUNITY)
Admission: RE | Disposition: A | Discharge status: Home / Self Care | Payer: Self-pay | Source: Ambulatory Visit | Attending: Gastroenterology

## 2011-07-29 DIAGNOSIS — A0472 Enterocolitis due to Clostridium difficile, not specified as recurrent: Secondary | ICD-10-CM | POA: Insufficient documentation

## 2011-07-29 DIAGNOSIS — R197 Diarrhea, unspecified: Secondary | ICD-10-CM

## 2011-07-29 HISTORY — DX: Myoneural disorder, unspecified: G70.9

## 2011-07-29 HISTORY — PX: COLONOSCOPY: SHX5424

## 2011-07-29 SURGERY — COLONOSCOPY
Anesthesia: Moderate Sedation

## 2011-07-29 MED ORDER — MIDAZOLAM HCL 10 MG/2ML IJ SOLN
INTRAMUSCULAR | Status: DC | PRN
  Administered 2011-07-29 (×3): 2 mg via INTRAVENOUS

## 2011-07-29 MED ORDER — SODIUM CHLORIDE 0.9 % IV SOLN
Freq: Once | INTRAVENOUS | Status: AC
  Administered 2011-07-29: 500 mL via INTRAVENOUS

## 2011-07-29 MED ORDER — MIDAZOLAM HCL 10 MG/2ML IJ SOLN
INTRAMUSCULAR | Status: AC
  Filled 2011-07-29: qty 4

## 2011-07-29 MED ORDER — FENTANYL CITRATE 0.05 MG/ML IJ SOLN
INTRAMUSCULAR | Status: AC
  Filled 2011-07-29: qty 4

## 2011-07-29 MED ORDER — FENTANYL NICU IV SYRINGE 50 MCG/ML
INJECTION | INTRAMUSCULAR | Status: DC | PRN
  Administered 2011-07-29 (×3): 25 ug via INTRAVENOUS

## 2011-07-29 MED ORDER — DIPHENHYDRAMINE HCL 50 MG/ML IJ SOLN
INTRAMUSCULAR | Status: AC
  Filled 2011-07-29: qty 1

## 2011-07-29 NOTE — Interval H&P Note (Signed)
History and Physical Interval Note:  07/29/2011 10:18 AM  Kurt Richard  has presented today for surgery, with the diagnosis of Diarrhea [787.91]  The various methods of treatment have been discussed with the patient and family. After consideration of risks, benefits and other options for treatment, the patient has consented to  Procedure(s) (LRB): COLONOSCOPY (N/A) as a surgical intervention .  The patient's history has been reviewed, patient examined, no change in status, stable for surgery.  I have reviewed the patients' chart and labs.  Questions were answered to the patient's satisfaction.     Rob Bunting

## 2011-07-29 NOTE — Op Note (Signed)
[facility] [address] [place], [place]  [address]  COLONOSCOPY PROCEDURE REPORT  PATIENT:  Kurt, Richard  MR#:  [id number] BIRTHDATE:  August 19, 1945, 66 yrs. old  GENDER:  male ENDOSCOPIST:  Rachael Fee, MD PROCEDURE DATE:  07/29/2011 PROCEDURE:  Colonoscopy with biopsy ASA CLASS:  Class III INDICATIONS:  several weeks of diarrhea, following CABG; C. diff + by PCR but has not responded comletely to flagyl twice, vanc twice; started dificid yesterday MEDICATIONS:   Fentanyl 75 mcg IV, Versed 6 mg IV  DESCRIPTION OF PROCEDURE:   After the risks benefits and alternatives of the procedure were thoroughly explained, informed consent was obtained.  Digital rectal exam was performed and revealed no rectal masses.   The  endoscope was introduced through the anus and advanced to the terminal ileum which was intubated for a short distance, without limitations.  The quality of the prep was good..  The instrument was then slowly withdrawn as the colon was fully examined. <<PROCEDUREIMAGES>>  FINDINGS:  There was yellow exudate coating much of the cecum. This was consistent with C. diff. Biopsies taken. Also in cecum there were scores of small white rod-like pieces. They were not moving. These are of unclear signficance, fluid in the cecum including many of the rods were aspirated and sent to microbiology. The rest of the colon and also the terminal ileum were completely normal appearing. Biopsies also taken from normal colon to check for microscopic colitis (see image1, image3, image4, image5, image6, image8, and image10).   Retroflexed views in the rectum revealed no abnormalities. COMPLICATIONS:  None ENDOSCOPIC IMPRESSION: 1) Focally abnormal mucosa (only in cecum). This was fairly consistent with C. difficile but not classic. Multiple biopsies taken, fluid from cecum aspirated. Normal colon biopsied s well.  RECOMMENDATIONS: 1) Continue dificid twice daily that  was started yesterday. Imodium at least once daily. 2) Await final biopsies.  ______________________________ Rachael Fee, MD  n. eSIGNED:   Rachael Fee at 07/29/2011 11:22 AM  Rick Duff, [id number]

## 2011-07-29 NOTE — Discharge Instructions (Signed)

## 2011-07-29 NOTE — H&P (View-Only) (Signed)
Review of pertinent gastrointestinal problems: 1. C. Diff positive by PCR June 2013. This was despite 2 courses of oral Flagyl and one course of vancomycin.   Was put on long tapering course of vancomycin  HPI: This is a  very pleasant 66 year old man whom I am meeting for the first time today.   tap ering vancomycin, down to two doses a day.    Reviewing his diarrhea, it does not seem he has really adequately responded to Flagyl or vancomycin and I'm starting to wonder whether the C. difficile by PCR is a false positive result. I am going to treat him      Past Medical History  Diagnosis Date  . Diabetes mellitus   . Hypertension   . Chronic systolic heart failure     echo 04/12/11: Mild LVH, EF 30-35%, mid to distal anteroseptal and apical hypokinesis, grade 2 diastolic dysfunction, moderate LAE, mild RAE.  [name] Hyperlipidemia   . Tobacco abuse   . CAD (coronary artery disease)     acute anterior STEMI, late presentation 04/06/11 LHC 3/5 demonstrated severe ostial left main 95% stenosis and otherwise three-vessel CAD with an ejection fraction of 15%.  Emergent CABG: Dr. Dorris Fetch - Grafts: LIMA-LAD, SVG-D1, SVG-OM1, SVG-PDA and PL.  . Ischemic cardiomyopathy   . DM2 (diabetes mellitus, type 2)   . 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 Acute renal insufficiency     05/2011 admission (cr 1.54 at discharge)  . Anemia     Post-CABG  . Arthritis   . Stroke   . Sleep apnea     Past Surgical History  Procedure Date  . Coronary artery bypass graft 04/06/2011    Procedure: CORONARY ARTERY BYPASS GRAFTING (CABG);  Surgeon: Loreli Slot, MD;  Location: [facility] OR;  Service: Open Heart Surgery;  Laterality: N/A;  Coronary Artery Bypass Graft times four utilizing the left internal mammary artery and the Right and left  greater Saphenous veins Harvested endoscopically.  . Fibroid bypass 04/06/11   . Colon surgery     Current Outpatient Prescriptions  Medication Sig Dispense Refill   . AMBULATORY NON FORMULARY MEDICATION Take 5 mLs by mouth as directed. Medication Name: Vancomycin 125 mg ( Dose is 5 mL) 5 mL 4 times day for 14 days 5 mL BID x 7 days 5 ml 1 daily x 7 days QOD x 7 days  5 ml every 3 days x 14 days  430 mL  0  . aspirin 325 MG EC tablet Take 325 mg by mouth daily.      [name] atorvastatin (LIPITOR) 80 MG tablet Take 1 tablet (80 mg total) by mouth daily at 6 PM.  30 tablet  5  . diphenhydrAMINE (BENADRYL) 25 mg capsule Take 25 mg by mouth daily. For runny nose      . diphenoxylate-atropine (LOMOTIL) 2.5-0.025 MG per tablet Take 1 tab twice daily .  60 tablet  0  . fluticasone (FLONASE) 50 MCG/ACT nasal spray Place 2 sprays into the nose daily.  16 g  6  . furosemide (LASIX) 40 MG tablet Take 40 mg by mouth daily.       [name] linagliptin (TRADJENTA) 5 MG TABS tablet Take 1 tablet (5 mg total) by mouth daily.  30 tablet  11  . metoprolol succinate (TOPROL-XL) 50 MG 24 hr tablet Take 1 tablet (50 mg total) by mouth 2 (two) times daily.  60 tablet  5  . nitroGLYCERIN (NITROSTAT) 0.4 MG  SL tablet Place 0.4 mg under the tongue every 5 (five) minutes as needed. For chest pain      . oxycodone (OXY-IR) 5 MG capsule Take 5 mg by mouth every 4 (four) hours as needed. For pain        Allergies as of 07/28/2011 - Review Complete 07/28/2011  Allergen Reaction Noted  . Diltiazem hcl Hives and Swelling 04/06/2011  . Nifedipine Hives and Swelling 04/06/2011    Family History  Problem Relation Age of Onset  . Heart attack Mother     ?32s  . Heart attack Sister     42s  . Diabetes Mother   . Diabetes Sister     [place]    History   Social History  . Marital Status: Single    Spouse Name: N/A    Number of Children: 0  . Years of Education: N/A   Occupational History  . Not on file.   Social History Main Topics  . Smoking status: Former Smoker -- 0.5 packs/day    Types: Cigarettes    Quit date: 04/06/2011  . Smokeless tobacco: Never Used  . Alcohol Use: No    . Drug Use: No  . Sexually Active: No   Other Topics Concern  . Not on file   Social History Narrative  . No narrative on file      Physical Exam: BP 126/72  Pulse 60  Ht 5\' 7"  (1.702 m)  Wt 143 lb (64.864 kg)  BMI 22.40 kg/m2 Constitutional: generally well-appearing Psychiatric: alert and oriented x3 Abdomen: soft, nontender, nondistended, no obvious ascites, no peritoneal signs, normal bowel sounds     Assessment and plan: 66 y.o. male with diarrhea   Diarrhea I am not convinced that he has Clostridium difficile associated diarrhea since he really is not responding to vancomycin much at all. He partial response and then the symptoms come raging back while he was still on vancomycin. I am changing him to  dificid for now and will proceed with colonoscopy tomorrow.

## 2011-07-30 ENCOUNTER — Encounter (HOSPITAL_COMMUNITY): Admission: RE | Admit: 2011-07-30 | Payer: 59 | Source: Ambulatory Visit

## 2011-07-30 ENCOUNTER — Encounter (HOSPITAL_COMMUNITY): Payer: Self-pay | Admitting: Gastroenterology

## 2011-08-02 ENCOUNTER — Encounter (HOSPITAL_COMMUNITY): Payer: 59

## 2011-08-04 ENCOUNTER — Encounter (HOSPITAL_COMMUNITY): Payer: 59

## 2011-08-04 ENCOUNTER — Telehealth: Payer: Self-pay | Admitting: Gastroenterology

## 2011-08-04 ENCOUNTER — Inpatient Hospital Stay (HOSPITAL_COMMUNITY)
Admission: EM | Admit: 2011-08-04 | Discharge: 2011-08-07 | DRG: 372 | Disposition: A | Discharge status: Home / Self Care | Payer: 59 | Attending: Internal Medicine | Admitting: Internal Medicine

## 2011-08-04 ENCOUNTER — Encounter (HOSPITAL_COMMUNITY): Payer: Self-pay | Admitting: Emergency Medicine

## 2011-08-04 DIAGNOSIS — I509 Heart failure, unspecified: Secondary | ICD-10-CM

## 2011-08-04 DIAGNOSIS — N179 Acute kidney failure, unspecified: Secondary | ICD-10-CM

## 2011-08-04 DIAGNOSIS — R197 Diarrhea, unspecified: Secondary | ICD-10-CM | POA: Diagnosis present

## 2011-08-04 DIAGNOSIS — R7989 Other specified abnormal findings of blood chemistry: Secondary | ICD-10-CM

## 2011-08-04 DIAGNOSIS — Z87891 Personal history of nicotine dependence: Secondary | ICD-10-CM

## 2011-08-04 DIAGNOSIS — Z7982 Long term (current) use of aspirin: Secondary | ICD-10-CM

## 2011-08-04 DIAGNOSIS — E785 Hyperlipidemia, unspecified: Secondary | ICD-10-CM

## 2011-08-04 DIAGNOSIS — Z720 Tobacco use: Secondary | ICD-10-CM

## 2011-08-04 DIAGNOSIS — I10 Essential (primary) hypertension: Secondary | ICD-10-CM

## 2011-08-04 DIAGNOSIS — R7402 Elevation of levels of lactic acid dehydrogenase (LDH): Secondary | ICD-10-CM | POA: Diagnosis present

## 2011-08-04 DIAGNOSIS — R7401 Elevation of levels of liver transaminase levels: Secondary | ICD-10-CM | POA: Diagnosis present

## 2011-08-04 DIAGNOSIS — G4730 Sleep apnea, unspecified: Secondary | ICD-10-CM | POA: Diagnosis present

## 2011-08-04 DIAGNOSIS — Z8673 Personal history of transient ischemic attack (TIA), and cerebral infarction without residual deficits: Secondary | ICD-10-CM

## 2011-08-04 DIAGNOSIS — I5022 Chronic systolic (congestive) heart failure: Secondary | ICD-10-CM

## 2011-08-04 DIAGNOSIS — E860 Dehydration: Secondary | ICD-10-CM

## 2011-08-04 DIAGNOSIS — I2589 Other forms of chronic ischemic heart disease: Secondary | ICD-10-CM | POA: Diagnosis present

## 2011-08-04 DIAGNOSIS — E0590 Thyrotoxicosis, unspecified without thyrotoxic crisis or storm: Secondary | ICD-10-CM

## 2011-08-04 DIAGNOSIS — I255 Ischemic cardiomyopathy: Secondary | ICD-10-CM

## 2011-08-04 DIAGNOSIS — I252 Old myocardial infarction: Secondary | ICD-10-CM

## 2011-08-04 DIAGNOSIS — I129 Hypertensive chronic kidney disease with stage 1 through stage 4 chronic kidney disease, or unspecified chronic kidney disease: Secondary | ICD-10-CM | POA: Diagnosis present

## 2011-08-04 DIAGNOSIS — Z79899 Other long term (current) drug therapy: Secondary | ICD-10-CM

## 2011-08-04 DIAGNOSIS — I213 ST elevation (STEMI) myocardial infarction of unspecified site: Secondary | ICD-10-CM

## 2011-08-04 DIAGNOSIS — D649 Anemia, unspecified: Secondary | ICD-10-CM

## 2011-08-04 DIAGNOSIS — I2510 Atherosclerotic heart disease of native coronary artery without angina pectoris: Secondary | ICD-10-CM

## 2011-08-04 DIAGNOSIS — E119 Type 2 diabetes mellitus without complications: Secondary | ICD-10-CM

## 2011-08-04 DIAGNOSIS — N183 Chronic kidney disease, stage 3 unspecified: Secondary | ICD-10-CM | POA: Diagnosis present

## 2011-08-04 DIAGNOSIS — A0472 Enterocolitis due to Clostridium difficile, not specified as recurrent: Secondary | ICD-10-CM

## 2011-08-04 DIAGNOSIS — N19 Unspecified kidney failure: Secondary | ICD-10-CM

## 2011-08-04 DIAGNOSIS — Z951 Presence of aortocoronary bypass graft: Secondary | ICD-10-CM

## 2011-08-04 LAB — URINALYSIS, ROUTINE W REFLEX MICROSCOPIC
Bilirubin Urine: NEGATIVE
Glucose, UA: NEGATIVE mg/dL
Hgb urine dipstick: NEGATIVE
Ketones, ur: NEGATIVE mg/dL
Protein, ur: NEGATIVE mg/dL

## 2011-08-04 LAB — CBC WITH DIFFERENTIAL/PLATELET
Basophils Absolute: 0 10*3/uL (ref 0.0–0.1)
Basophils Relative: 0 % (ref 0–1)
Eosinophils Absolute: 0.3 10*3/uL (ref 0.0–0.7)
Eosinophils Relative: 4 % (ref 0–5)
Lymphocytes Relative: 22 % (ref 12–46)
MCH: 30.2 pg (ref 26.0–34.0)
MCV: 83.2 fL (ref 78.0–100.0)
Platelets: 155 10*3/uL (ref 150–400)
RDW: 15.4 % (ref 11.5–15.5)
WBC: 7.9 10*3/uL (ref 4.0–10.5)

## 2011-08-04 LAB — COMPREHENSIVE METABOLIC PANEL
CO2: 15 mEq/L — ABNORMAL LOW (ref 19–32)
Calcium: 9 mg/dL (ref 8.4–10.5)
Creatinine, Ser: 2.32 mg/dL — ABNORMAL HIGH (ref 0.50–1.35)
GFR calc Af Amer: 32 mL/min — ABNORMAL LOW (ref >90)
GFR calc non Af Amer: 28 mL/min — ABNORMAL LOW (ref >90)
Glucose, Bld: 141 mg/dL — ABNORMAL HIGH (ref 70–99)
Total Bilirubin: 0.3 mg/dL (ref 0.3–1.2)

## 2011-08-04 MED ORDER — FLUTICASONE PROPIONATE 50 MCG/ACT NA SUSP
2.0000 | Freq: Every day | NASAL | Status: DC
  Filled 2011-08-04: qty 16

## 2011-08-04 MED ORDER — SODIUM CHLORIDE 0.9 % IV SOLN
INTRAVENOUS | Status: DC
  Administered 2011-08-04: 1000 mL via INTRAVENOUS
  Administered 2011-08-05 – 2011-08-06 (×4): via INTRAVENOUS
  Administered 2011-08-07: 50 mL/h via INTRAVENOUS

## 2011-08-04 MED ORDER — LOPERAMIDE HCL 2 MG PO CAPS: 4.0000 mg | ORAL_CAPSULE | Freq: Two times a day (BID) | ORAL | Status: DC | PRN

## 2011-08-04 MED ORDER — FIDAXOMICIN 200 MG PO TABS
200.0000 mg | ORAL_TABLET | Freq: Two times a day (BID) | ORAL | Status: DC
  Administered 2011-08-04 – 2011-08-07 (×6): 200 mg via ORAL
  Filled 2011-08-04 (×7): qty 1

## 2011-08-04 MED ORDER — METRONIDAZOLE IN NACL 5-0.79 MG/ML-% IV SOLN
500.0000 mg | Freq: Once | INTRAVENOUS | Status: AC
  Administered 2011-08-04: 500 mg via INTRAVENOUS
  Filled 2011-08-04: qty 100

## 2011-08-04 MED ORDER — ONDANSETRON HCL 4 MG/2ML IJ SOLN: 4.0000 mg | Freq: Four times a day (QID) | INTRAMUSCULAR | Status: DC | PRN

## 2011-08-04 MED ORDER — OXYCODONE HCL 5 MG PO TABS: 5.0000 mg | ORAL_TABLET | ORAL | Status: DC | PRN

## 2011-08-04 MED ORDER — LINAGLIPTIN 5 MG PO TABS
5.0000 mg | ORAL_TABLET | Freq: Every day | ORAL | Status: DC
  Administered 2011-08-05: 5 mg via ORAL
  Filled 2011-08-04: qty 1

## 2011-08-04 MED ORDER — ASPIRIN EC 325 MG PO TBEC
325.0000 mg | DELAYED_RELEASE_TABLET | Freq: Every day | ORAL | Status: DC
  Administered 2011-08-05 – 2011-08-07 (×3): 325 mg via ORAL
  Filled 2011-08-04 (×3): qty 1

## 2011-08-04 MED ORDER — SODIUM CHLORIDE 0.9 % IV BOLUS (SEPSIS)
1000.0000 mL | Freq: Once | INTRAVENOUS | Status: AC
  Administered 2011-08-04: 1000 mL via INTRAVENOUS

## 2011-08-04 MED ORDER — ATORVASTATIN CALCIUM 80 MG PO TABS
80.0000 mg | ORAL_TABLET | Freq: Every day | ORAL | Status: DC
  Administered 2011-08-04 – 2011-08-06 (×3): 80 mg via ORAL
  Filled 2011-08-04 (×4): qty 1

## 2011-08-04 MED ORDER — METOPROLOL SUCCINATE ER 50 MG PO TB24
50.0000 mg | ORAL_TABLET | Freq: Two times a day (BID) | ORAL | Status: DC
  Administered 2011-08-04 – 2011-08-07 (×6): 50 mg via ORAL
  Filled 2011-08-04 (×7): qty 1

## 2011-08-04 MED ORDER — ENOXAPARIN SODIUM 30 MG/0.3ML ~~LOC~~ SOLN
30.0000 mg | Freq: Every day | SUBCUTANEOUS | Status: DC
  Administered 2011-08-04 – 2011-08-05 (×2): 30 mg via SUBCUTANEOUS
  Filled 2011-08-04 (×3): qty 0.3

## 2011-08-04 MED ORDER — MORPHINE SULFATE 2 MG/ML IJ SOLN: 1.0000 mg | INTRAMUSCULAR | Status: DC | PRN

## 2011-08-04 MED ORDER — NITROGLYCERIN 0.4 MG SL SUBL: 0.4000 mg | SUBLINGUAL_TABLET | SUBLINGUAL | Status: DC | PRN

## 2011-08-04 MED ORDER — ONDANSETRON HCL 4 MG PO TABS: 4.0000 mg | ORAL_TABLET | Freq: Four times a day (QID) | ORAL | Status: DC | PRN

## 2011-08-04 NOTE — ED Provider Notes (Signed)
Medical screening examination/treatment/procedure(s) were performed by non-physician practitioner and as supervising physician I was immediately available for consultation/collaboration.  Juliet Rude. Rubin Payor, MD 08/04/11 2356

## 2011-08-04 NOTE — ED Notes (Signed)
Pt coming in for dehydration from Dr Christella Hartigan GI doc. Pt has had diarrhea since March and dx with C-diff. Pt had bypass surgery in March and the problem started after leaving the hospital.

## 2011-08-04 NOTE — ED Notes (Addendum)
Pt "stated that Dr Gerilyn Pilgrim, GI dr requested that pt come to ED for eval,c/o diarrhea, hypertension x2 days.  Fall Sunday and bruised his left hip and cut elbow." Hx of c-diff in march.  Denies blood in stool.

## 2011-08-04 NOTE — H&P (Signed)
[facility] History and Physical  Kurt Richard VHQ:[id number] DOB: 08-Jan-1946 DOA: 08/04/2011  Referring physician: Dr. Rubin Payor PCP: Kristian Covey, MD   Chief Complaint: Persistent diarrhea  HPI:  [name] 66 yo male who reports being diagnosed with C. Diff colitis 3 weeks ago after he has had non bloody and watery diarrhea for 3 months. He is unable to keep food down and think he has been loosing weight. He was seen by Dr. Christella Hartigan and was treated adequately with Flagyl and course of Vancomycin, long course. Apparently this has not helped much per patient and he is back with same concern. He denies any chest pain or shortness of breath, no urinary concerns, no other abdominal concerns. Per doctor Christella Hartigan he is suspecting this may be false positive at this point.  Review of Systems:   Constitutional: Negative for fever, chills and malaise/fatigue. Negative for diaphoresis.  HENT: Negative for hearing loss, ear pain, nosebleeds, congestion, sore throat, neck pain, tinnitus and ear discharge.   Eyes: Negative for blurred vision, double vision, photophobia, pain, discharge and redness.  Respiratory: Negative for cough, hemoptysis, sputum production, shortness of breath, wheezing and stridor.   Cardiovascular: Negative for chest pain, palpitations, orthopnea, claudication and leg swelling.  Gastrointestinal: Negative for nausea, vomiting and abdominal pain. Negative for heartburn, constipation, blood in stool and melena. Positive for diarrhea.  Genitourinary: Negative for dysuria, urgency, frequency, hematuria and flank pain.  Musculoskeletal: Negative for myalgias, back pain, joint pain and falls.  Skin: Negative for itching and rash.  Neurological: Negative for dizziness and weakness. Negative for tingling, tremors, sensory change, speech change, focal weakness, loss of consciousness and headaches.  Endo/Heme/Allergies: Negative for environmental allergies and polydipsia. Does not  bruise/bleed easily.  Psychiatric/Behavioral: Negative for suicidal ideas. The patient is not nervous/anxious.      Past Medical History  Diagnosis Date  . Diabetes mellitus   . Hypertension   . Chronic systolic heart failure     echo 04/12/11: Mild LVH, EF 30-35%, mid to distal anteroseptal and apical hypokinesis, grade 2 diastolic dysfunction, moderate LAE, mild RAE.  [name] Hyperlipidemia   . Tobacco abuse   . CAD (coronary artery disease)     acute anterior STEMI, late presentation 04/06/11 LHC 3/5 demonstrated severe ostial left main 95% stenosis and otherwise three-vessel CAD with an ejection fraction of 15%.  Emergent CABG: Dr. Dorris Fetch - Grafts: LIMA-LAD, SVG-D1, SVG-OM1, SVG-PDA and PL.  . Ischemic cardiomyopathy   . DM2 (diabetes mellitus, type 2)   . 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 Acute renal insufficiency     05/2011 admission (cr 1.54 at discharge)  . Anemia     Post-CABG  . Arthritis   . Stroke   . Sleep apnea   . Neuromuscular disorder     diabetic neuropathy  . CHF (congestive heart failure)    Past Surgical History  Procedure Date  . Coronary artery bypass graft 04/06/2011    Procedure: CORONARY ARTERY BYPASS GRAFTING (CABG);  Surgeon: Loreli Slot, MD;  Location: [facility] OR;  Service: Open Heart Surgery;  Laterality: N/A;  Coronary Artery Bypass Graft times four utilizing the left internal mammary artery and the Right and left  greater Saphenous veins Harvested endoscopically.  . Fibroid bypass 04/06/11   . Colon surgery     rectal fissure  . Cervical disc surgery   . Colonoscopy 07/29/2011    Procedure: COLONOSCOPY;  Surgeon: Rachael Fee, MD;  Location: [facility];  Service: Endoscopy;  Laterality: N/A;   Social History:  reports that he quit smoking about 3 months ago. His smoking use included Cigarettes. He smoked .5 packs per day. He has never used smokeless tobacco. He reports that he does not drink alcohol or use illicit  drugs.  Allergies  Allergen Reactions  . Diltiazem Hcl Hives and Swelling  . Nifedipine Hives and Swelling    Family History  Problem Relation Age of Onset  . Heart attack Mother     ?3s  . Heart attack Sister     13s  . Diabetes Mother   . Diabetes Sister     [place]    Prior to Admission medications   Medication Sig Start Date End Date Taking? Authorizing Provider  aspirin 325 MG EC tablet Take 325 mg by mouth daily. 04/16/11  Yes Rowe Clack, PA  atorvastatin (LIPITOR) 80 MG tablet Take 1 tablet (80 mg total) by mouth daily at 6 PM. 05/21/11 05/20/12 Yes [name], MD  fidaxomicin (DIFICID) 200 MG TABS tablet Take 1 tablet (200 mg total) by mouth 2 (two) times daily. 07/28/11  Yes Rachael Fee, MD  fluticasone [facility]) 50 MCG/ACT nasal spray Place 2 sprays into the nose daily. 06/11/11 06/10/12 Yes Kristian Covey, MD  furosemide (LASIX) 40 MG tablet Take 40 mg by mouth daily.  04/23/11 04/22/12 Yes [name], MD  linagliptin (TRADJENTA) 5 MG TABS tablet Take 1 tablet (5 mg total) by mouth daily. 05/12/11  Yes Kristian Covey, MD  loperamide (IMODIUM) 2 MG capsule Take 4 mg by mouth 2 (two) times daily as needed. For diarrhea relief.   Yes Historical Provider, MD  metoprolol succinate (TOPROL-XL) 50 MG 24 hr tablet Take 1 tablet (50 mg total) by mouth 2 (two) times daily. 05/21/11 05/20/12 Yes [name], MD  MOVIPREP 100 G SOLR Take 1 kit (100 g total) by mouth as directed. Name brand only 07/28/11  Yes Rachael Fee, MD  nitroGLYCERIN (NITROSTAT) 0.4 MG SL tablet Place 0.4 mg under the tongue every 5 (five) minutes as needed. For chest pain 04/23/11 04/22/12 Yes [name], MD  oxycodone (OXY-IR) 5 MG capsule Take 5 mg by mouth every 4 (four) hours as needed. For pain   Yes Historical Provider, MD   Physical Exam: Filed Vitals:   08/04/11 1651 08/04/11 2001  BP: 120/59 164/63  Pulse: 80 76  Temp: 97.5 F (36.4 C)   TempSrc: Oral   Resp:  16  SpO2:  100% 100%   Physical Exam  Constitutional: Appears well-developed and well-nourished. No distress.  Head: Normocephalic.  Right Ear: External ear normal.  Left Ear: External ear normal.  Nose: Nose normal.  Mouth/Throat: Oropharynx is clear and moist.  Eyes: Conjunctivae and EOM are normal. Pupils are equal, round, and reactive to light. Right eye exhibits no discharge. Left eye exhibits no discharge. No scleral icterus.  Neck: Normal ROM. Neck supple. No JVD. No tracheal deviation. No thyromegaly.  Cardiovascular: Normal rate, regular rhythm, normal heart sounds and intact distal pulses.  Exam reveals no gallop and no friction rub. No murmur heard. Pulmonary/Chest: Effort normal and breath sounds normal. No stridor. No respiratory distress. No wheezes, no rales.  Abdominal: Soft. Bowel sounds are normal. No distension and no mass, tenderness in epigastric area. No rebound and no guarding.  Musculoskeletal: Normal range of motion. No edema and no tenderness.  Lymphadenopathy: No lymphadenopathy noted, cervical, inguinal. Neurological: Alert. Normal reflexes.  No cranial nerve deficit. Normal muscle tone. Coordination normal.  Skin: Skin is warm and dry. No rash noted. Not diaphoretic. No erythema. No pallor.  Psychiatric: Normal mood and affect. Behavior is normal. Judgment and thought content normal.   Labs on Admission:  Basic Metabolic Panel:  Lab 08/04/11 1610  NA 138  K 3.9  CL 110  CO2 15*  GLUCOSE 141*  BUN 69*  CREATININE 2.32*  CALCIUM 9.0  MG --  PHOS --   Liver Function Tests:  Lab 08/04/11 1955  AST 37  ALT 98*  ALKPHOS 153*  BILITOT 0.3  PROT 7.0  ALBUMIN 3.6    Lab 08/04/11 1955  LIPASE 65*  AMYLASE --   CBC:  Lab 08/04/11 1955  WBC 7.9  NEUTROABS 5.0  HGB 11.7*  HCT 32.3*  MCV 83.2  PLT 155   CBG:  Lab 07/29/11 1019  GLUCAP 131*    Radiological Exams on Admission: No results found.  EKG: Normal sinus rhythm  Assessment/Plan  C.  Diff Colitis - pt with persistent diarrhea and it appears that it is dehydrating him - will provide supportive care for now - will call GI on call for Dr. Christella Hartigan - IVF - check electrolytes in AM  Transaminitis - unclear etiology at this time - will obtain hepatitis panel - CMET in AM  Acute on Chronic renal failure  - likely secondary to dehydration - provide IVF for now - BMP in AM  Code Status: Full Family Communication: Pt at bedside Disposition Plan: to medically floor  Debbora Presto, MD  [facility] Pager [phone]  If 7PM-7AM, please contact night-coverage [email or web address] Password [facility] 08/04/2011, 9:42 PM

## 2011-08-04 NOTE — ED Provider Notes (Signed)
Pt admitted by [facility] for chronic C-diff unrelieved with outpatient treatment.  Dr. Dickie La from [facility] has been consulted and will see pt in the AM.    Fayrene Helper, PA-C 08/04/11 2203

## 2011-08-04 NOTE — Telephone Encounter (Signed)
Returning your call. °

## 2011-08-04 NOTE — ED Notes (Signed)
IV Rn notified and aware.

## 2011-08-04 NOTE — ED Notes (Signed)
IV team paged after 4 unsuccessful attempts to insert IV by 2 nurses.

## 2011-08-04 NOTE — ED Provider Notes (Signed)
Medical screening examination/treatment/procedure(s) were performed by non-physician practitioner and as supervising physician I was immediately available for consultation/collaboration.  Laquan Ludden R. Shamecca Whitebread, MD 08/04/11 2356 

## 2011-08-04 NOTE — ED Notes (Signed)
MD at bedside. Dr. Aldine Contes at bedside.

## 2011-08-04 NOTE — ED Notes (Signed)
Informed by floor RN that pt is now going to [facility].  Awaiting room assignment.

## 2011-08-04 NOTE — ED Provider Notes (Addendum)
History     CSN: [id number]  Arrival date & time 08/04/11  1556   First MD Initiated Contact with Patient 08/04/11 1707      7:18 PM HPI Reports dxed with C-diff 3 weeks ago. States he has had diarrhea for 3 months. Reports everytime he eats of drinks he will have loose watery stool. Reports recommended by Dr. Christella Hartigan, GI to come to the ED check for dehydration. Denies fatigue, elevated heart rate, fever or weakness. Patient is a 66 y.o. male presenting with diarrhea. The history is provided by the patient.  Diarrhea The primary symptoms include abdominal pain, vomiting and diarrhea. Primary symptoms do not include fever, fatigue, nausea, hematemesis, jaundice, hematochezia, dysuria or rash. Episode onset: 3 months ago. The onset was gradual. The problem has not changed since onset. The illness does not include chills, constipation or back pain.    Past Medical History  Diagnosis Date  . Diabetes mellitus   . Hypertension   . Chronic systolic heart failure     echo 04/12/11: Mild LVH, EF 30-35%, mid to distal anteroseptal and apical hypokinesis, grade 2 diastolic dysfunction, moderate LAE, mild RAE.  [name] Hyperlipidemia   . Tobacco abuse   . CAD (coronary artery disease)     acute anterior STEMI, late presentation 04/06/11 LHC 3/5 demonstrated severe ostial left main 95% stenosis and otherwise three-vessel CAD with an ejection fraction of 15%.  Emergent CABG: Dr. Dorris Fetch - Grafts: LIMA-LAD, SVG-D1, SVG-OM1, SVG-PDA and PL.  . Ischemic cardiomyopathy   . DM2 (diabetes mellitus, type 2)   . 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 Acute renal insufficiency     05/2011 admission (cr 1.54 at discharge)  . Anemia     Post-CABG  . Arthritis   . Stroke   . Sleep apnea   . Neuromuscular disorder     diabetic neuropathy  . CHF (congestive heart failure)     Past Surgical History  Procedure Date  . Coronary artery bypass graft 04/06/2011    Procedure: CORONARY ARTERY BYPASS  GRAFTING (CABG);  Surgeon: Loreli Slot, MD;  Location: [facility] OR;  Service: Open Heart Surgery;  Laterality: N/A;  Coronary Artery Bypass Graft times four utilizing the left internal mammary artery and the Right and left  greater Saphenous veins Harvested endoscopically.  . Fibroid bypass 04/06/11   . Colon surgery     rectal fissure  . Cervical disc surgery   . Colonoscopy 07/29/2011    Procedure: COLONOSCOPY;  Surgeon: Rachael Fee, MD;  Location: [facility] ENDOSCOPY;  Service: Endoscopy;  Laterality: N/A;    Family History  Problem Relation Age of Onset  . Heart attack Mother     ?37s  . Heart attack Sister     63s  . Diabetes Mother   . Diabetes Sister     [place]    History  Substance Use Topics  . Smoking status: Former Smoker -- 0.5 packs/day    Types: Cigarettes    Quit date: 04/06/2011  . Smokeless tobacco: Never Used  . Alcohol Use: No      Review of Systems  Constitutional: Negative for fever, chills and fatigue.  Respiratory: Negative for shortness of breath.   Cardiovascular: Negative for chest pain.  Gastrointestinal: Positive for vomiting, abdominal pain and diarrhea. Negative for nausea, constipation, blood in stool, hematochezia, rectal pain, hematemesis and jaundice.  Genitourinary: Negative for dysuria, hematuria, flank pain, discharge, penile pain and testicular pain.  Musculoskeletal: Negative  for back pain.  Skin: Negative for rash.  Neurological: Negative for dizziness, weakness, numbness and headaches.  All other systems reviewed and are negative.    Allergies  Diltiazem hcl and Nifedipine  Home Medications   Current Outpatient Rx  Name Route Sig Dispense Refill  . ASPIRIN 325 MG PO TBEC Oral Take 325 mg by mouth daily.    . ATORVASTATIN CALCIUM 80 MG PO TABS Oral Take 1 tablet (80 mg total) by mouth daily at 6 PM. 30 tablet 5  . FIDAXOMICIN 200 MG PO TABS Oral Take 1 tablet (200 mg total) by mouth 2 (two) times daily. 20 tablet 2  .  FLUTICASONE PROPIONATE 50 MCG/ACT NA SUSP Nasal Place 2 sprays into the nose daily. 16 g 6  . FUROSEMIDE 40 MG PO TABS Oral Take 40 mg by mouth daily.     [name] LINAGLIPTIN 5 MG PO TABS Oral Take 1 tablet (5 mg total) by mouth daily. 30 tablet 11  . LOPERAMIDE HCL 2 MG PO CAPS Oral Take 4 mg by mouth 2 (two) times daily as needed. For diarrhea relief.    [name] METOPROLOL SUCCINATE ER 50 MG PO TB24 Oral Take 1 tablet (50 mg total) by mouth 2 (two) times daily. 60 tablet 5    INCREASE DOSE  . MOVIPREP 100 G PO SOLR Oral Take 1 kit (100 g total) by mouth as directed. Name brand only 1 kit 0    Dispense as written.  [name] NITROGLYCERIN 0.4 MG SL SUBL Sublingual Place 0.4 mg under the tongue every 5 (five) minutes as needed. For chest pain    . OXYCODONE HCL 5 MG PO CAPS Oral Take 5 mg by mouth every 4 (four) hours as needed. For pain      BP 120/59  Pulse 80  Temp 97.5 F (36.4 C) (Oral)  SpO2 100%  Physical Exam  Constitutional: He is oriented to person, place, and time. He appears well-developed and well-nourished.  HENT:  Head: Normocephalic and atraumatic.  Eyes: Conjunctivae are normal. Pupils are equal, round, and reactive to light.  Neck: Normal range of motion. Neck supple.  Cardiovascular: Normal rate, regular rhythm and normal heart sounds.   Pulmonary/Chest: Effort normal and breath sounds normal.  Abdominal: Soft. Bowel sounds are normal. He exhibits no distension and no mass. There is no tenderness. There is no rebound and no guarding.  Neurological: He is alert and oriented to person, place, and time.  Skin: Skin is warm and dry. No rash noted. No erythema. No pallor.  Psychiatric: He has a normal mood and affect. His behavior is normal.    ED Course  Procedures   Results for orders placed during the hospital encounter of 08/04/11  CBC WITH DIFFERENTIAL      Component Value Range   WBC 7.9  4.0 - 10.5 K/uL   RBC 3.88 (*) 4.22 - 5.81 MIL/uL   Hemoglobin 11.7 (*) 13.0 - 17.0 g/dL     HCT 16.1 (*) 09.6 - 52.0 %   MCV 83.2  78.0 - 100.0 fL   MCH 30.2  26.0 - 34.0 pg   MCHC 36.2 (*) 30.0 - 36.0 g/dL   RDW 04.5  40.9 - 81.1 %   Platelets 155  150 - 400 K/uL   Neutrophils Relative 64  43 - 77 %   Neutro Abs 5.0  1.7 - 7.7 K/uL   Lymphocytes Relative 22  12 - 46 %   Lymphs Abs 1.7  0.7 -  4.0 K/uL   Monocytes Relative 10  3 - 12 %   Monocytes Absolute 0.8  0.1 - 1.0 K/uL   Eosinophils Relative 4  0 - 5 %   Eosinophils Absolute 0.3  0.0 - 0.7 K/uL   Basophils Relative 0  0 - 1 %   Basophils Absolute 0.0  0.0 - 0.1 K/uL   WBC Morphology TOXIC GRANULATION     RBC Morphology POLYCHROMASIA PRESENT     Smear Review LARGE PLATELETS PRESENT    URINALYSIS, ROUTINE W REFLEX MICROSCOPIC      Component Value Range   Color, Urine YELLOW  YELLOW   APPearance CLEAR  CLEAR   Specific Gravity, Urine 1.016  1.005 - 1.030   pH 5.5  5.0 - 8.0   Glucose, UA NEGATIVE  NEGATIVE mg/dL   Hgb urine dipstick NEGATIVE  NEGATIVE   Bilirubin Urine NEGATIVE  NEGATIVE   Ketones, ur NEGATIVE  NEGATIVE mg/dL   Protein, ur NEGATIVE  NEGATIVE mg/dL   Urobilinogen, UA 0.2  0.0 - 1.0 mg/dL   Nitrite NEGATIVE  NEGATIVE   Leukocytes, UA NEGATIVE  NEGATIVE  COMPREHENSIVE METABOLIC PANEL      Component Value Range   Sodium 138  135 - 145 mEq/L   Potassium 3.9  3.5 - 5.1 mEq/L   Chloride 110  96 - 112 mEq/L   CO2 15 (*) 19 - 32 mEq/L   Glucose, Bld 141 (*) 70 - 99 mg/dL   BUN 69 (*) 6 - 23 mg/dL   Creatinine, Ser 4.54 (*) 0.50 - 1.35 mg/dL   Calcium 9.0  8.4 - 09.8 mg/dL   Total Protein 7.0  6.0 - 8.3 g/dL   Albumin 3.6  3.5 - 5.2 g/dL   AST 37  0 - 37 U/L   ALT 98 (*) 0 - 53 U/L   Alkaline Phosphatase 153 (*) 39 - 117 U/L   Total Bilirubin 0.3  0.3 - 1.2 mg/dL   GFR calc non Af Amer 28 (*) >90 mL/min   GFR calc Af Amer 32 (*) >90 mL/min  LIPASE, BLOOD      Component Value Range   Lipase 65 (*) 11 - 59 U/L     ED ECG REPORT   Date: 08/18/2011  EKG Time: 7:18 PM  Rate: 79  Rhythm:  normal sinus rhythm  Axis: Right  Intervals:none  ST&T Change: Nonspecific EKG changes.  Narrative Interpretation: No significant changes since April 2013            MDM  Patient has had multiple attempt to correct cdiff outpatient. Recommended by GI MD to come in and "be evaluated"  8:48 PM Patient's CO2 is down to 15. Recommended inpatient treatment for Cdiff due to failed outpatient. Pt and family voice understanding and agree with plan  Spoke with Dr. Izola Price [facility], hospitalist, she will come see the patient for admission . Discussed patient with Dr Rubin Payor and Fayrene Helper    Thomasene Lot, PA-C 08/04/11 2124  Thomasene Lot, PA-C 08/18/11 1918

## 2011-08-05 DIAGNOSIS — R6889 Other general symptoms and signs: Secondary | ICD-10-CM

## 2011-08-05 DIAGNOSIS — E785 Hyperlipidemia, unspecified: Secondary | ICD-10-CM

## 2011-08-05 DIAGNOSIS — E860 Dehydration: Secondary | ICD-10-CM

## 2011-08-05 DIAGNOSIS — I5022 Chronic systolic (congestive) heart failure: Secondary | ICD-10-CM

## 2011-08-05 DIAGNOSIS — R197 Diarrhea, unspecified: Secondary | ICD-10-CM

## 2011-08-05 DIAGNOSIS — A0472 Enterocolitis due to Clostridium difficile, not specified as recurrent: Principal | ICD-10-CM

## 2011-08-05 DIAGNOSIS — N179 Acute kidney failure, unspecified: Secondary | ICD-10-CM | POA: Diagnosis present

## 2011-08-05 DIAGNOSIS — R7989 Other specified abnormal findings of blood chemistry: Secondary | ICD-10-CM | POA: Diagnosis present

## 2011-08-05 DIAGNOSIS — E119 Type 2 diabetes mellitus without complications: Secondary | ICD-10-CM | POA: Diagnosis present

## 2011-08-05 LAB — GLUCOSE, CAPILLARY
Glucose-Capillary: 86 mg/dL (ref 70–99)
Glucose-Capillary: 133 mg/dL — ABNORMAL HIGH (ref 70–99)
Glucose-Capillary: 147 mg/dL — ABNORMAL HIGH (ref 70–99)
Glucose-Capillary: 126 mg/dL — ABNORMAL HIGH (ref 70–99)

## 2011-08-05 LAB — CBC
HCT: 31.2 % — ABNORMAL LOW (ref 39.0–52.0)
MCH: 29.4 pg (ref 26.0–34.0)
MCV: 82.5 fL (ref 78.0–100.0)
RBC: 3.78 MIL/uL — ABNORMAL LOW (ref 4.22–5.81)
WBC: 6.9 10*3/uL (ref 4.0–10.5)

## 2011-08-05 LAB — COMPREHENSIVE METABOLIC PANEL
ALT: 79 U/L — ABNORMAL HIGH (ref 0–53)
Alkaline Phosphatase: 139 U/L — ABNORMAL HIGH (ref 39–117)
CO2: 17 mEq/L — ABNORMAL LOW (ref 19–32)
Chloride: 112 mEq/L (ref 96–112)
GFR calc Af Amer: 36 mL/min — ABNORMAL LOW (ref >90)
GFR calc non Af Amer: 31 mL/min — ABNORMAL LOW (ref >90)
Glucose, Bld: 95 mg/dL (ref 70–99)
Potassium: 3.6 mEq/L (ref 3.5–5.1)
Sodium: 140 mEq/L (ref 135–145)
Total Bilirubin: 0.4 mg/dL (ref 0.3–1.2)

## 2011-08-05 LAB — T4, FREE: Free T4: 1.35 ng/dL (ref 0.80–1.80)

## 2011-08-05 MED ORDER — SACCHAROMYCES BOULARDII 250 MG PO CAPS
250.0000 mg | ORAL_CAPSULE | Freq: Two times a day (BID) | ORAL | Status: DC
  Administered 2011-08-05 – 2011-08-07 (×5): 250 mg via ORAL
  Filled 2011-08-05 (×6): qty 1

## 2011-08-05 MED ORDER — INSULIN ASPART 100 UNIT/ML ~~LOC~~ SOLN
0.0000 [IU] | Freq: Three times a day (TID) | SUBCUTANEOUS | Status: DC
  Administered 2011-08-05 – 2011-08-07 (×4): 1 [IU] via SUBCUTANEOUS

## 2011-08-05 NOTE — Progress Notes (Signed)
Subjective: Denies any loose stool since admission; no N/V or abdominal pain. Also denies CP and SOB. Feeling better.  Objective: Vital signs in last 24 hours: Temp:  [97.5 F (36.4 C)-98.7 F (37.1 C)] 98.3 F (36.8 C) (07/04 0538) Pulse Rate:  [70-83] 70  (07/04 0538) Resp:  [15-18] 18  (07/04 0538) BP: (108-164)/(50-72) 138/72 mmHg (07/04 0538) SpO2:  [99 %-100 %] 100 % (07/04 0538) Weight:  [64.9 kg (143 lb 1.3 oz)] 64.9 kg (143 lb 1.3 oz) (07/03 2155) Weight change:  Last BM Date: 08/04/11  Intake/Output from previous day: 07/03 0701 - 07/04 0700 In: -  Out: 301 [Urine:301]     Physical Exam: General: Alert, awake, oriented x3, in no acute distress. HEENT: No bruits, no goiter. Heart: Regular rate and rhythm, without murmurs, rubs, gallops. Lungs: Clear to auscultation bilaterally. Abdomen: Soft, nontender, nondistended, positive bowel sounds. Extremities: No clubbing, cyanosis or edema with positive pedal pulses. Neuro: Grossly intact, nonfocal.   Lab Results: Basic Metabolic Panel:  Basename 08/05/11 0341 08/04/11 1955  NA 140 138  K 3.6 3.9  CL 112 110  CO2 17* 15*  GLUCOSE 95 141*  BUN 61* 69*  CREATININE 2.11* 2.32*  CALCIUM 8.6 9.0  MG -- 1.9  PHOS -- 5.2*   Liver Function Tests:  [facility] 08/05/11 0341 08/04/11 1955  AST 30 37  ALT 79* 98*  ALKPHOS 139* 153*  BILITOT 0.4 0.3  PROT 6.6 7.0  ALBUMIN 3.4* 3.6    Basename 08/04/11 1955  LIPASE 65*  AMYLASE --   CBC:  Basename 08/05/11 0341 08/04/11 1955  WBC 6.9 7.9  NEUTROABS -- 5.0  HGB 11.1* 11.7*  HCT 31.2* 32.3*  MCV 82.5 83.2  PLT 157 155   CBG:  Basename 08/05/11 0724  GLUCAP 86   Thyroid Function Tests:  Basename 08/04/11 2210  TSH 0.051*  T4TOTAL --  FREET4 --  T3FREE --  THYROIDAB --   Urinalysis:  Basename 08/04/11 1940  COLORURINE YELLOW  LABSPEC 1.016  PHURINE 5.5  GLUCOSEU NEGATIVE  HGBUR NEGATIVE  BILIRUBINUR NEGATIVE  KETONESUR NEGATIVE    PROTEINUR NEGATIVE  UROBILINOGEN 0.2  NITRITE NEGATIVE  LEUKOCYTESUR NEGATIVE   Misc. Labs:  Recent Results (from the past 240 hour(s))  CLOSTRIDIUM DIFFICILE BY PCR     Status: Normal   Collection Time   07/29/11 10:45 AM      Component Value Range Status Comment   C difficile by pcr NEGATIVE  NEGATIVE Final     Studies/Results: No results found.  Medications: Scheduled Meds:   . aspirin  325 mg Oral Daily  . atorvastatin  80 mg Oral q1800  . enoxaparin  30 mg Subcutaneous QHS  . fidaxomicin  200 mg Oral BID  . fluticasone  2 spray Each Nare Daily  . insulin aspart  0-9 Units Subcutaneous TID WC  . linagliptin  5 mg Oral Daily  . metoprolol succinate  50 mg Oral BID  . metronidazole  500 mg Intravenous Once  . sodium chloride  1,000 mL Intravenous Once   Continuous Infusions:   . sodium chloride 200 mL/hr at 08/05/11 0849   PRN Meds:.loperamide, morphine injection, nitroGLYCERIN, ondansetron (ZOFRAN) IV, ondansetron, oxyCODONE  Assessment/Plan: 1-Diarrhea: with positive C. Diff at the beginning of June; now has received treatment with Flagyl for 2 occasions and also tapering tx with vancomycin. Now on fidaxomicin twice a day. Considerations for diarrhea despite c. Diff includes: thyroid problem (especially hyperthyroidism, had abnormal TSH); and also  IBS. Will continue tx with abx's for now; provide supportive care and symptomatic tx. Will follow GI rec's consulted on admission.  2-Hyperlipidemia: will check lipid panel; continue statins.  3-Hypertension: stable. Since BP was on the soft side his antihypertensive drugs were place on hold. Will resume tomorrow if BP allows it.  4-CAD (coronary artery disease): no acute CP or SOB; continue ASA. Will resume B-blocker once BP is stable again.  5-Chronic systolic heart failure with EF 30-35%: compensated. Will be cautious with IVF's. Continue daily weight and close I's and O's.  6-Dehydration: improving with IVF's;  continue resuscitation.  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 Lasix will be held; continue IVF's and follow Cr trend.  8-Diabetes mellitus: will check A1c; continue linagliptin and SSI.  DVT: Lovenox.    LOS: 1 day   Kurt Richard [facility] Hospitalist [phone]  08/05/2011, 9:56 AM

## 2011-08-05 NOTE — Consult Note (Signed)
Referring Provider: [facility] Primary Care Physician:  Kristian Covey, MD Primary Gastroenterologist:  Dr.Jacobs  Reason for Consultation:  Persistent diarrhea,dehydration ,recent C.diff  HPI: Kurt Richard is a 66 y.o. male recently known to Dr. Christella Hartigan. He has history of an MI in March of 2013 for which he underwent CABG. Unfortunately also had a cardioembolic stroke during that admission. Other medical problems include history of adult-onset diabetes mellitus hypertension and congestive heart failure with EF of 30-35%. Patient developed diarrhea shortly after discharge from the hospital in March and was treated for C. difficile with 2 courses of Flagyl by his primary care provider though no cultures were done initially. He was then treated with a course of vancomycin orally and had persistent diarrhea. He was referred to GI and initially seen in early June, and was treated with a long tapered course of vancomycin over 4 weeks. He continued to have watery diarrhea and anorexia and ultimately underwent colonoscopy with Dr. Christella Hartigan on 07/29/2011. He was found to have some yellowish exudate in the cecum which was felt to be consistent with C. Difficile- remainder of the colon appeared normal though he did have some small whitish rodlike structures noted in his colon. Biopsies of the cecum were taken and these were negative for evidence of inflammatory bowel disease and C. difficile. He had a positive C. difficile by PCR at the end of May 2013 and this was repeated on 07/29/2011 was negative. At the time of colonoscopy he had just been started on Dificid, and continues on Dificid twice daily since. Patient states that he continues to feel generally poorly, with significant fatigue . He says he has no appetite though denies any nausea or vomiting. He is not really having any abdominal pain but says that he doesn't eat much because he usually has to go to the bathroom after meals.he says his diarrhea  has not been as bad this past week during the day- he may have 2-3 watery bowel movements, however he is having more problems at night on especially after eating dinner within an hour to an hour and half he starts with diarrhea and then may have multiple episodes through the night. The stool is currently nonbloody. He has not had any recent fever, chills or sweats. He came to the emergency room yesterday as he was having persistent diarrhea and increased weakness was concerned about dehydration His creatinine was 2.1. WBC is normal.  Since admission he has not had any further diarrheal stools.   Past Medical History  Diagnosis Date  . Diabetes mellitus   . Hypertension   . Chronic systolic heart failure     echo 04/12/11: Mild LVH, EF 30-35%, mid to distal anteroseptal and apical hypokinesis, grade 2 diastolic dysfunction, moderate LAE, mild RAE.  [name] Hyperlipidemia   . Tobacco abuse   . CAD (coronary artery disease)     acute anterior STEMI, late presentation 04/06/11 LHC 3/5 demonstrated severe ostial left main 95% stenosis and otherwise three-vessel CAD with an ejection fraction of 15%.  Emergent CABG: Dr. Dorris Fetch - Grafts: LIMA-LAD, SVG-D1, SVG-OM1, SVG-PDA and PL.  . Ischemic cardiomyopathy   . DM2 (diabetes mellitus, type 2)   . 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 Acute renal insufficiency     05/2011 admission (cr 1.54 at discharge)  . Anemia     Post-CABG  . Arthritis   . Stroke   . Sleep apnea   . Neuromuscular disorder  diabetic neuropathy  . CHF (congestive heart failure)     Past Surgical History  Procedure Date  . Coronary artery bypass graft 04/06/2011    Procedure: CORONARY ARTERY BYPASS GRAFTING (CABG);  Surgeon: Loreli Slot, MD;  Location: [facility] OR;  Service: Open Heart Surgery;  Laterality: N/A;  Coronary Artery Bypass Graft times four utilizing the left internal mammary artery and the Right and left  greater Saphenous veins Harvested  endoscopically.  . Fibroid bypass 04/06/11   . Colon surgery     rectal fissure  . Cervical disc surgery   . Colonoscopy 07/29/2011    Procedure: COLONOSCOPY;  Surgeon: Rachael Fee, MD;  Location: [facility] ENDOSCOPY;  Service: Endoscopy;  Laterality: N/A;    Prior to Admission medications   Medication Sig Start Date End Date Taking? Authorizing Provider  aspirin 325 MG EC tablet Take 325 mg by mouth daily. 04/16/11  Yes Rowe Clack, PA  atorvastatin (LIPITOR) 80 MG tablet Take 1 tablet (80 mg total) by mouth daily at 6 PM. 05/21/11 05/20/12 Yes [name], MD  fidaxomicin (DIFICID) 200 MG TABS tablet Take 1 tablet (200 mg total) by mouth 2 (two) times daily. 07/28/11  Yes Rachael Fee, MD  fluticasone [facility]) 50 MCG/ACT nasal spray Place 2 sprays into the nose daily. 06/11/11 06/10/12 Yes Kristian Covey, MD  furosemide (LASIX) 40 MG tablet Take 40 mg by mouth daily.  04/23/11 04/22/12 Yes [name], MD  linagliptin (TRADJENTA) 5 MG TABS tablet Take 1 tablet (5 mg total) by mouth daily. 05/12/11  Yes Kristian Covey, MD  loperamide (IMODIUM) 2 MG capsule Take 4 mg by mouth 2 (two) times daily as needed. For diarrhea relief.   Yes Historical Provider, MD  metoprolol succinate (TOPROL-XL) 50 MG 24 hr tablet Take 1 tablet (50 mg total) by mouth 2 (two) times daily. 05/21/11 05/20/12 Yes [name], MD  MOVIPREP 100 G SOLR Take 1 kit (100 g total) by mouth as directed. Name brand only 07/28/11  Yes Rachael Fee, MD  nitroGLYCERIN (NITROSTAT) 0.4 MG SL tablet Place 0.4 mg under the tongue every 5 (five) minutes as needed. For chest pain 04/23/11 04/22/12 Yes [name], MD  oxycodone (OXY-IR) 5 MG capsule Take 5 mg by mouth every 4 (four) hours as needed. For pain   Yes Historical Provider, MD    Current Facility-Administered Medications  Medication Dose Route Frequency Provider Last Rate Last Dose  . 0.9 %  sodium chloride infusion   Intravenous Continuous Vassie Loll, MD 75  mL/hr at 08/05/11 0926    . aspirin EC tablet 325 mg  325 mg Oral Daily Dorothea Ogle, MD   325 mg at 08/05/11 0912  . atorvastatin (LIPITOR) tablet 80 mg  80 mg Oral q1800 Dorothea Ogle, MD   80 mg at 08/04/11 2218  . enoxaparin (LOVENOX) injection 30 mg  30 mg Subcutaneous QHS Dorothea Ogle, MD   30 mg at 08/04/11 2353  . fidaxomicin (DIFICID) tablet 200 mg  200 mg Oral BID Dorothea Ogle, MD   200 mg at 08/05/11 0912  . fluticasone (FLONASE) 50 MCG/ACT nasal spray 2 spray  2 spray Each Nare Daily Dorothea Ogle, MD      . insulin aspart (novoLOG) injection 0-9 Units  0-9 Units Subcutaneous TID WC Vassie Loll, MD      . linagliptin (TRADJENTA) tablet 5 mg  5 mg Oral Daily Dorothea Ogle,  MD   5 mg at 08/05/11 0912  . loperamide (IMODIUM) capsule 4 mg  4 mg Oral BID PRN Dorothea Ogle, MD      . metoprolol succinate (TOPROL-XL) 24 hr tablet 50 mg  50 mg Oral BID Dorothea Ogle, MD   50 mg at 08/05/11 0912  . metroNIDAZOLE (FLAGYL) IVPB 500 mg  500 mg Intravenous Once Thomasene Lot, PA-C   500 mg at 08/04/11 2127  . morphine 2 MG/ML injection 1 mg  1 mg Intravenous Q4H PRN Dorothea Ogle, MD      . nitroGLYCERIN (NITROSTAT) SL tablet 0.4 mg  0.4 mg Sublingual Q5 min PRN Dorothea Ogle, MD      . ondansetron [facility]) tablet 4 mg  4 mg Oral Q6H PRN Dorothea Ogle, MD       Or  . ondansetron [facility]) injection 4 mg  4 mg Intravenous Q6H PRN Dorothea Ogle, MD      . oxyCODONE (Oxy IR/ROXICODONE) immediate release tablet 5 mg  5 mg Oral Q4H PRN Dorothea Ogle, MD      . sodium chloride 0.9 % bolus 1,000 mL  1,000 mL Intravenous Once Thomasene Lot, PA-C   1,000 mL at 08/04/11 1932    Allergies as of 08/04/2011 - Review Complete 08/04/2011  Allergen Reaction Noted  . Diltiazem hcl Hives and Swelling 04/06/2011  . Nifedipine Hives and Swelling 04/06/2011    Family History  Problem Relation Age of Onset  . Heart attack Mother     ?30s  . Heart attack Sister     62s  . Diabetes Mother   .  Diabetes Sister     [place]    History   Social History  . Marital Status: Single    Spouse Name: N/A    Number of Children: 0  . Years of Education: N/A   Occupational History  . Not on file.   Social History Main Topics  . Smoking status: Former Smoker -- 0.5 packs/day    Types: Cigarettes    Quit date: 04/06/2011  . Smokeless tobacco: Never Used  . Alcohol Use: No  . Drug Use: No  . Sexually Active: No   Other Topics Concern  . Not on file   Social History Narrative  . No narrative on file    Review of Systems: Pertinent positive and negative review of systems were noted in the above HPI section.  All other review of systems was otherwise negative.  Physical Exam: Vital signs in last 24 hours: Temp:  [97.5 F (36.4 C)-98.7 F (37.1 C)] 98.3 F (36.8 C) (07/04 0538) Pulse Rate:  [70-83] 70  (07/04 0538) Resp:  [15-18] 18  (07/04 0538) BP: (108-164)/(50-72) 138/72 mmHg (07/04 0538) SpO2:  [99 %-100 %] 100 % (07/04 0538) Weight:  [143 lb 1.3 oz (64.9 kg)] 143 lb 1.3 oz (64.9 kg) (07/03 2155) Last BM Date: 08/04/11 General:   Alert,  Well-developed, thin,pale,, pleasant and cooperative in NAD Head:  Normocephalic and atraumatic. Eyes:  Sclera clear, no icterus.   Conjunctiva pink. Ears:  Normal auditory acuity. Nose:  No deformity, discharge,  or lesions. Mouth:  No deformity or lesions tongue whiitish.   Neck:  Supple; no masses or thyromegaly. Lungs:  Clear throughout to auscultation.   No wheezes, crackles, or rhonchi. Heart:  Regular rate and rhythm; no murmurs, clicks, rubs,  or gallops. Abdomen:  Soft,nontender, BS active,nonpalp mass or hsm.   Rectal:  Deferred  Msk:  Symmetrical without gross deformities. . Pulses:  Normal pulses noted. Extremities:  Without clubbing or edema. Neurologic:  Alert and  oriented x4;  grossly normal neurologically. Skin:  Intact without significant lesions or rashes.. Psych:  Alert and cooperative. Normal mood and flat  affect  Intake/Output from previous day: 07/03 0701 - 07/04 0700 In: -  Out: 301 [Urine:301] Intake/Output this shift:    Lab Results:  Basename 08/05/11 0341 08/04/11 1955  WBC 6.9 7.9  HGB 11.1* 11.7*  HCT 31.2* 32.3*  PLT 157 155   BMET  Basename 08/05/11 0341 08/04/11 1955  NA 140 138  K 3.6 3.9  CL 112 110  CO2 17* 15*  GLUCOSE 95 141*  BUN 61* 69*  CREATININE 2.11* 2.32*  CALCIUM 8.6 9.0   LFT  Basename 08/05/11 0341  PROT 6.6  ALBUMIN 3.4*  AST 30  ALT 79*  ALKPHOS 139*  BILITOT 0.4  BILIDIR --  IBILI --     Studies/Results: No results found.  IMPRESSION:  #18 66 year old male with 3 month history of persistent diarrhea, proven C. differential positive May 2013. Diarrhea has persisted despite 2 courses of vancomycin one of which was tapered over 4 weeks. Patient is now being treated with Dificid, and last PCR on 07/29/2011 was negative. He also had colonoscopy on 627 which did show some exudate in the cecum consistent with pseudomembranes biopsies were negative. Etiology of his persistent diarrhea is not entirely clear though have high suspicion that we are still dealing with C. difficile #2 dehydration secondary to above  #3 coronary artery disease status post MI 3 2013 and CABG 2013 #4 congestive heart failure, ischemic cardiomyopathy #5 adult-onset diabetes mellitus PLAN: #1 repeat stool for C. difficile by PCR, and stool lactoferrin #2 continue 14 day course of Dificid #3 add floor store one by mouth twice daily #4 soft low residue diet #5 stop Linagliptin  and for now as this drug list high incidence of diarrhea   Thanks we will follow with you   Amy Esterwood  08/05/2011, 10:46 AM  GI ATTENDING  History, laboratories, x-rays, endoscopy report, and pathology all reviewed personally. Patient personally seen and examined. Agree with H&P as outlined above. Multiple medical problems. 3 months of diarrhea. Previously positive Clostridium  difficile. Seemingly unresponsive to multiple appropriate therapies. Colonoscopy and biopsies did not support C. Differential colitis. Remarkably, no diarrhea suggest today. Actually no bowel movements. Physical exam is benign. Agree with holding linaglipin. I think that it is okay to use low-dose Imodium for diarrhea in this case. Await repeat C. Difficile PCR. Discussed with patient and friend. Will follow  Wilhemina Bonito. Eda Keys., M.D. [facility] Division of Gastroenterology

## 2011-08-06 ENCOUNTER — Encounter (HOSPITAL_COMMUNITY): Payer: 59 | Attending: Cardiovascular Disease

## 2011-08-06 DIAGNOSIS — Z951 Presence of aortocoronary bypass graft: Secondary | ICD-10-CM | POA: Insufficient documentation

## 2011-08-06 DIAGNOSIS — E785 Hyperlipidemia, unspecified: Secondary | ICD-10-CM | POA: Insufficient documentation

## 2011-08-06 DIAGNOSIS — F17200 Nicotine dependence, unspecified, uncomplicated: Secondary | ICD-10-CM | POA: Insufficient documentation

## 2011-08-06 DIAGNOSIS — E0590 Thyrotoxicosis, unspecified without thyrotoxic crisis or storm: Secondary | ICD-10-CM

## 2011-08-06 DIAGNOSIS — I509 Heart failure, unspecified: Secondary | ICD-10-CM | POA: Insufficient documentation

## 2011-08-06 DIAGNOSIS — I2589 Other forms of chronic ischemic heart disease: Secondary | ICD-10-CM

## 2011-08-06 DIAGNOSIS — I2510 Atherosclerotic heart disease of native coronary artery without angina pectoris: Secondary | ICD-10-CM | POA: Insufficient documentation

## 2011-08-06 DIAGNOSIS — Z5189 Encounter for other specified aftercare: Secondary | ICD-10-CM | POA: Insufficient documentation

## 2011-08-06 DIAGNOSIS — N19 Unspecified kidney failure: Secondary | ICD-10-CM

## 2011-08-06 DIAGNOSIS — E119 Type 2 diabetes mellitus without complications: Secondary | ICD-10-CM | POA: Insufficient documentation

## 2011-08-06 DIAGNOSIS — I252 Old myocardial infarction: Secondary | ICD-10-CM | POA: Insufficient documentation

## 2011-08-06 LAB — LIPID PANEL
Cholesterol: 79 mg/dL (ref 0–200)
HDL: 27 mg/dL — ABNORMAL LOW (ref >39)
LDL Cholesterol: 33 mg/dL (ref 0–99)
Triglycerides: 97 mg/dL (ref <150)
VLDL: 19 mg/dL (ref 0–40)

## 2011-08-06 LAB — BASIC METABOLIC PANEL
BUN: 44 mg/dL — ABNORMAL HIGH (ref 6–23)
CO2: 17 mEq/L — ABNORMAL LOW (ref 19–32)
Calcium: 8.6 mg/dL (ref 8.4–10.5)
Creatinine, Ser: 1.7 mg/dL — ABNORMAL HIGH (ref 0.50–1.35)
GFR calc Af Amer: 47 mL/min — ABNORMAL LOW (ref >90)

## 2011-08-06 LAB — GLUCOSE, CAPILLARY
Glucose-Capillary: 100 mg/dL — ABNORMAL HIGH (ref 70–99)
Glucose-Capillary: 137 mg/dL — ABNORMAL HIGH (ref 70–99)
Glucose-Capillary: 149 mg/dL — ABNORMAL HIGH (ref 70–99)
Glucose-Capillary: 111 mg/dL — ABNORMAL HIGH (ref 70–99)

## 2011-08-06 LAB — FECAL LACTOFERRIN, QUANT: Fecal Lactoferrin: POSITIVE

## 2011-08-06 MED ORDER — ENOXAPARIN SODIUM 40 MG/0.4ML ~~LOC~~ SOLN
40.0000 mg | Freq: Every day | SUBCUTANEOUS | Status: DC
  Administered 2011-08-06: 40 mg via SUBCUTANEOUS
  Filled 2011-08-06 (×2): qty 0.4

## 2011-08-06 MED ORDER — METHIMAZOLE 5 MG PO TABS
2.5000 mg | ORAL_TABLET | Freq: Two times a day (BID) | ORAL | Status: DC
  Administered 2011-08-06 – 2011-08-07 (×3): 2.5 mg via ORAL
  Filled 2011-08-06 (×4): qty 1

## 2011-08-06 MED ORDER — ISOSORB DINITRATE-HYDRALAZINE 20-37.5 MG PO TABS
1.0000 | ORAL_TABLET | Freq: Two times a day (BID) | ORAL | Status: DC
  Administered 2011-08-06 – 2011-08-07 (×3): 1 via ORAL
  Filled 2011-08-06 (×4): qty 1

## 2011-08-06 MED ORDER — DIPHENOXYLATE-ATROPINE 2.5-0.025 MG PO TABS
1.0000 | ORAL_TABLET | Freq: Two times a day (BID) | ORAL | Status: DC
  Administered 2011-08-06 – 2011-08-07 (×3): 1 via ORAL
  Filled 2011-08-06 (×3): qty 1

## 2011-08-06 NOTE — Progress Notes (Signed)
Lovenox - Pharmacy may adjust  Lovenox 30mg  sq q24h ordered, pharmacy may adjust Wt 65kg, Scr 1.7, CrCl 40 ml/min No bleeding reported, CBC reviewed  Will change to 40mg  sq q24h based on renal fx and wt.  Gwen Her PharmD  [phone] 08/06/2011 1:09 PM

## 2011-08-06 NOTE — Progress Notes (Signed)
Patient ID: Kurt Richard, male   DOB: 1946/01/08, 66 y.o.   MRN: [id number] [facility] Progress Note  Subjective: No diarrhea until last night- then up 5-6 x during night with liquid stool. No abdominal pain- did not eat this am because he didn't want to have diarrhea.... Repeat cdiff pcr is negative, stool lactoferrin + TSH is low Stopped Tradjenta yesterday  Objective:  Vital signs in last 24 hours: Temp:  [98.4 F (36.9 C)-99.3 F (37.4 C)] 98.6 F (37 C) (07/05 0523) Pulse Rate:  [75-81] 75  (07/05 0523) Resp:  [16-18] 18  (07/05 0523) BP: (149-173)/(69-70) 158/69 mmHg (07/05 0523) SpO2:  [98 %-99 %] 99 % (07/05 0523) Weight:  [143 lb 1.6 oz (64.91 kg)] 143 lb 1.6 oz (64.91 kg) (07/04 1500) Last BM Date: 08/06/11 General:   Alert,  Well-developed,    in NAD,thin ,flat affect Heart:  Regular rate and rhythm; no murmurs Pulmclear; Abdomen:  Soft, nontender and nondistended. Normal bowel sounds, without guarding, and without rebound.   Extremities:  Without edema. Neurologic:  Alert and  oriented x4;  grossly normal neurologically. Psych:  Alert and cooperative. Normal mood and affect.  Intake/Output from previous day: 07/04 0701 - 07/05 0700 In: 480 [P.O.:480] Out: 529 [Urine:525; Stool:4] Intake/Output this shift:    Lab Results:  Basename 08/05/11 0341 08/04/11 1955  WBC 6.9 7.9  HGB 11.1* 11.7*  HCT 31.2* 32.3*  PLT 157 155   BMET  Basename 08/06/11 0404 08/05/11 0341 08/04/11 1955  NA 140 140 138  K 3.8 3.6 3.9  CL 113* 112 110  CO2 17* 17* 15*  GLUCOSE 121* 95 141*  BUN 44* 61* 69*  CREATININE 1.70* 2.11* 2.32*  CALCIUM 8.6 8.6 9.0   LFT  Basename 08/05/11 0341  PROT 6.6  ALBUMIN 3.4*  AST 30  ALT 79*  ALKPHOS 139*  BILITOT 0.4  BILIDIR --  IBILI --    Assessment / Plan: 66 yo male with 3 month hx of persistent diarrhea and weight loss. Pt has had proven Cdiff, but has been adequately treated and now has had 2 negative cdiff  PCR's. Will look for other causes- ie meds ,Tradjenta , and perhaps Hyperthyroid component. Encourage po intake Finish 14 day course of Dificid Finish 2 weeks of [employer] Start lomotil BID Leave off Tradjenta Dr Gwenlyn Perking to manage hyperthyroid issue  #2 acute on chronic renal insuff- improving #3 CAD -s/p MI 3/13, and CABG #4 Aodm    Principal Problem:  *Diarrhea Active Problems:  Hyperlipidemia  Hypertension  CAD (coronary artery disease)  Chronic systolic heart failure  Dehydration  Acute on chronic kidney failure  Diabetes mellitus  Abnormal TSH  Clostridium difficile diarrhea     LOS: 2 days   Amy Esterwood  08/06/2011, 9:11 AM    GI ATTENDING  Patient seen and examined.Diarrhea last night as noted. However, no bowel movements yet today. Diabetic medications stop. Okay to use Lomotil as needed. Physical exam benign.  Wilhemina Bonito. Eda Keys., M.D. [facility] Division of Gastroenterology

## 2011-08-06 NOTE — Progress Notes (Signed)
Subjective: 5-6 episodes of loose stool last night; no abdominal pain, no N/V. Afebrile.  Objective: Vital signs in last 24 hours: Temp:  [98.4 F (36.9 C)-99.3 F (37.4 C)] 98.6 F (37 C) (07/05 0523) Pulse Rate:  [75-81] 75  (07/05 0523) Resp:  [16-18] 18  (07/05 0523) BP: (149-173)/(69-70) 158/69 mmHg (07/05 0523) SpO2:  [98 %-99 %] 99 % (07/05 0523) Weight:  [64.91 kg (143 lb 1.6 oz)] 64.91 kg (143 lb 1.6 oz) (07/04 1500) Weight change: 0.01 kg (0.3 oz) Last BM Date: 08/06/11  Intake/Output from previous day: 07/04 0701 - 07/05 0700 In: 480 [P.O.:480] Out: 529 [Urine:525; Stool:4]     Physical Exam: General: Alert, awake, oriented x3, in no acute distress. A little sweaty to touch. HEENT: No bruits, no goiter. Heart: Regular rate and rhythm, without murmurs, rubs, gallops. Lungs: Clear to auscultation bilaterally. Abdomen: Soft, nontender, nondistended, positive bowel sounds. Extremities: No clubbing, cyanosis or edema with positive pedal pulses. Neuro: Grossly intact, nonfocal.   Lab Results: Basic Metabolic Panel:  Basename 08/06/11 0404 08/05/11 0341 08/04/11 1955  NA 140 140 --  K 3.8 3.6 --  CL 113* 112 --  CO2 17* 17* --  GLUCOSE 121* 95 --  BUN 44* 61* --  CREATININE 1.70* 2.11* --  CALCIUM 8.6 8.6 --  MG -- -- 1.9  PHOS -- -- 5.2*   Liver Function Tests:  [facility] 08/05/11 0341 08/04/11 1955  AST 30 37  ALT 79* 98*  ALKPHOS 139* 153*  BILITOT 0.4 0.3  PROT 6.6 7.0  ALBUMIN 3.4* 3.6    Basename 08/04/11 1955  LIPASE 65*  AMYLASE --   CBC:  Basename 08/05/11 0341 08/04/11 1955  WBC 6.9 7.9  NEUTROABS -- 5.0  HGB 11.1* 11.7*  HCT 31.2* 32.3*  MCV 82.5 83.2  PLT 157 155   CBG:  Basename 08/06/11 0753 08/05/11 2114 08/05/11 1645 08/05/11 1122 08/05/11 0724  GLUCAP 100* 126* 147* 133* 86   Thyroid Function Tests:  Basename 08/05/11 1021  TSH 0.048*  T4TOTAL --  FREET4 1.35  T3FREE --  THYROIDAB --   Urinalysis:  Basename  08/04/11 1940  COLORURINE YELLOW  LABSPEC 1.016  PHURINE 5.5  GLUCOSEU NEGATIVE  HGBUR NEGATIVE  BILIRUBINUR NEGATIVE  KETONESUR NEGATIVE  PROTEINUR NEGATIVE  UROBILINOGEN 0.2  NITRITE NEGATIVE  LEUKOCYTESUR NEGATIVE   Misc. Labs:  Recent Results (from the past 240 hour(s))  CLOSTRIDIUM DIFFICILE BY PCR     Status: Normal   Collection Time   07/29/11 10:45 AM      Component Value Range Status Comment   C difficile by pcr NEGATIVE  NEGATIVE Final   CLOSTRIDIUM DIFFICILE BY PCR     Status: Normal   Collection Time   08/05/11  8:34 PM      Component Value Range Status Comment   C difficile by pcr NEGATIVE  NEGATIVE Final     Studies/Results: No results found.  Medications: Scheduled Meds:    . aspirin  325 mg Oral Daily  . atorvastatin  80 mg Oral q1800  . diphenoxylate-atropine  1 tablet Oral BID  . enoxaparin  30 mg Subcutaneous QHS  . fidaxomicin  200 mg Oral BID  . fluticasone  2 spray Each Nare Daily  . insulin aspart  0-9 Units Subcutaneous TID WC  . isosorbide-hydrALAZINE  1 tablet Oral BID  . methimazole  2.5 mg Oral BID  . metoprolol succinate  50 mg Oral BID  . saccharomyces boulardii  250  mg Oral BID  . DISCONTD: linagliptin  5 mg Oral Daily   Continuous Infusions:    . sodium chloride 75 mL/hr at 08/05/11 2003   PRN Meds:.morphine injection, nitroGLYCERIN, ondansetron (ZOFRAN) IV, ondansetron, oxyCODONE, DISCONTD: loperamide  Assessment/Plan: 1-Diarrhea: C. difficile negative x2, positive lactoferrin. Now on fidaxomicin twice a day with plans to finish 14 days treatment. Considerations for diarrhea despite c. Diff includes: thyroid problem (especially hyperthyroidism, had abnormal TSH); and also IBS.  -Per GI recommendations continue dificid, continue florastor and start lomotil BID. -Will also treat his subclinical hypothyroidism with methimazole. -Continue supportive care and electrolytes repletion as needed. -Discontinue  Tradjenta  2-Hyperlipidemia: Lipid panel demonstrating good control; continue statins.  3-Hypertension: Now after fluid resuscitation and blood pressure is rising. Do to his kidney dysfunction unable to use any ACE inhibitors or ARB's; will add bidil and continue metorpolol.  4-CAD (coronary artery disease): no acute CP or SOB; continue ASA and B-blocker.  5-Chronic systolic heart failure with EF 30-35%: compensated. Will be cautious with IVF's. Continue daily weight and close I's and O's. Unable to use ACE inhibitors or ARB is due to chronic kidney disease. Will start by deal for better control of his blood pressure and to help with his heart failure treatment.  6-Dehydration: [place] [employer] much resolve with IVF's; IV fluids adjusted to 50 cc per hour at this point.   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 Lasix  continue to be  on hold; creatinine pretty much back to baseline (1.6-1.8). Will adjust IV fluids, repeat basic metabolic panel in the morning to follow creatinine; encourage by mouth intake.   8-Diabetes mellitus: A1c 6.3; continue SSI; tradjenta discontinue due to high incidence of associated diarrhea and GI upset symptoms with this medication and ongoing diarrhea.  9-subclinical hyperthyroidism: TSH less than 0.1, patient with weight loss, diarrhea and sweating. Also with high risk for cardiovascular problems. Will treat with low-dose methimazole and follow his thyroid function.    DVT: Lovenox.    LOS: 2 days   Khup Sapia [facility] [phone]  08/06/2011, 9:50 AM

## 2011-08-07 DIAGNOSIS — I10 Essential (primary) hypertension: Secondary | ICD-10-CM

## 2011-08-07 LAB — BASIC METABOLIC PANEL
BUN: 36 mg/dL — ABNORMAL HIGH (ref 6–23)
Chloride: 115 mEq/L — ABNORMAL HIGH (ref 96–112)
GFR calc Af Amer: 53 mL/min — ABNORMAL LOW (ref >90)
GFR calc non Af Amer: 46 mL/min — ABNORMAL LOW (ref >90)
Potassium: 3.9 mEq/L (ref 3.5–5.1)

## 2011-08-07 LAB — GLUCOSE, CAPILLARY: Glucose-Capillary: 127 mg/dL — ABNORMAL HIGH (ref 70–99)

## 2011-08-07 MED ORDER — ISOSORB DINITRATE-HYDRALAZINE 20-37.5 MG PO TABS: 1.0000 | ORAL_TABLET | Freq: Two times a day (BID) | ORAL | Status: DC

## 2011-08-07 MED ORDER — METHIMAZOLE 5 MG PO TABS: 2.5000 mg | ORAL_TABLET | Freq: Two times a day (BID) | ORAL | Status: DC

## 2011-08-07 MED ORDER — DIPHENOXYLATE-ATROPINE 2.5-0.025 MG PO TABS: 1.0000 | ORAL_TABLET | Freq: Two times a day (BID) | ORAL | Status: AC

## 2011-08-07 MED ORDER — SACCHAROMYCES BOULARDII 250 MG PO CAPS: 250.0000 mg | ORAL_CAPSULE | Freq: Two times a day (BID) | ORAL | Status: AC

## 2011-08-07 NOTE — Discharge Summary (Signed)
Physician Discharge Summary  Kurt Richard ZOX:[id number] DOB: 1945/11/18 DOA: 08/04/2011  PCP: Kristian Covey, MD  Admit date: 08/04/2011 Discharge date: 08/07/2011  Discharge Diagnoses:  Principal Problem:  *Diarrhea Active Problems:  Hyperlipidemia  Hypertension  CAD (coronary artery disease)  Chronic systolic heart failure  Dehydration  Acute on chronic kidney failure  Diabetes mellitus  Abnormal TSH  Clostridium difficile diarrhea  Subclinical hyperthyroidism   Discharge Condition: Stable  Disposition:   Diet: Diabetic diet  History of present illness:  [name] 66 yo male who reports being diagnosed with C. Diff colitis 3 weeks ago after he has had non bloody and watery diarrhea for 3 months. He is unable to keep food down and think he has been loosing weight. He was seen by Dr. Christella Hartigan and was treated adequately with Flagyl and course of Vancomycin, long course. Apparently this has not helped much per patient and he is back with same concern. He denies any chest pain or shortness of breath, no urinary concerns, no other abdominal concerns. Per doctor Christella Hartigan he is suspecting this may be false positive at this point.   Hospital Course:  Assessment/Plan:  1-Diarrhea: C. difficile negative x2, positive lactoferrin. Now on fidaxomicin twice a day with plans to finish 14 days treatment. Considerations for diarrhea despite c. Diff includes: thyroid problem (especially hyperthyroidism, had abnormal TSH); and also IBS.  -Per GI recommendations continue dificid, continue florastor and start lomotil BID.  -Will also treat his subclinical hypothyroidism with methimazole.  -Continue supportive care and electrolytes repletion as needed.  -Discontinue Tradjenta, Will recommend diabetic diet as outpatient and have recommended that he follow up with his primary care physician as outpatient. 2-Hyperlipidemia: Lipid panel demonstrating good control; continue statins.  3-Hypertension: Now after  fluid resuscitation and blood pressure is rising. Do to his kidney dysfunction unable to use any ACE inhibitors or ARB's; will add bidil and continue metorpolol.  4-CAD (coronary artery disease): no acute CP or SOB; continue ASA and B-blocker.  5-Chronic systolic heart failure with EF 30-35%: compensated. Will be cautious with IVF's. Continue daily weight and close I's and O's. Unable to use ACE inhibitors or ARB is due to chronic kidney disease. Will start by bidil for better control of his blood pressure and to help with his heart failure treatment.  6-Dehydration: Penelope Coop much resolve with IVF's, patient with improved po intake and as such have recommended increase po intake once transitioned home.  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 Lasix continue to be on hold and will continue to hold until cleared by his PCP; creatinine back to baseline (1.6-1.8). Encouraged by mouth intake.  8-Diabetes mellitus: A1c 6.3; tradjenta discontinue due to high incidence of associated diarrhea and GI upset symptoms with this medication and ongoing diarrhea. Recommended diabetic diet and f/u with PCP 9-subclinical hyperthyroidism: TSH less than 0.1, patient with weight loss, diarrhea and sweating. Also with high risk for cardiovascular problems. Will treat with low-dose methimazole and follow his thyroid function.  Recommended PCP follow up for further evaluation and management options.   Discharge Exam: Filed Vitals:   08/07/11 0512  BP: 126/58  Pulse: 76  Temp: 98.6 F (37 C)  Resp: 18   Filed Vitals:   08/06/11 0523 08/06/11 1413 08/06/11 2043 08/07/11 0512  BP: 158/69 131/64 163/73 126/58  Pulse: 75 80 75 76  Temp: 98.6 F (37 C) 98.6 F (37 C) 98.7 F (37.1 C) 98.6 F (37  C)  TempSrc: Oral  Oral Oral  Resp: 18 16 18 18   Height:      Weight:      SpO2: 99% 99% 99% 98%   General: Pt in NAD Cardiovascular: RRR no murmurs, rubs, or  gallops Respiratory: CTA BL, speaking in full sentences.  Discharge Instructions  Discharge Orders    Future Appointments: Provider: Department: Dept Phone: Center:   08/09/2011 11:15 AM [facility] [phone] None   08/11/2011 11:15 AM [facility] [phone] None   08/13/2011 11:15 AM [facility] [phone] None   08/16/2011 11:15 AM [facility] [phone] None   08/18/2011 9:20 AM [facility] [phone] [facility]   08/18/2011 11:15 AM [facility] [phone] None   08/20/2011 11:15 AM [facility] [phone] None   08/23/2011 11:15 AM [facility] [phone] None   08/25/2011 11:15 AM [facility] [phone] None   08/27/2011 11:15 AM [facility] [phone] None   08/30/2011 11:15 AM [facility] [phone] None   09/01/2011 11:15 AM [facility] [phone] None   09/03/2011 11:15 AM [facility] [phone] None   09/06/2011 11:15 AM [facility] [phone] None   09/08/2011 11:15 AM [facility] [phone] None   09/10/2011 11:15 AM [facility] [phone] None   09/13/2011 10:15 AM Kristian Covey, MD [facility] [phone] [facility]   09/13/2011 11:15 AM [facility] [phone] None   09/15/2011 11:15 AM [facility] [phone] None   09/17/2011 11:15 AM [facility] [phone] None   09/21/2011 10:30 AM [name], MD [facility] [phone] None   09/23/2011 10:15 AM [name], MD [facility] [phone] None   09/23/2011 10:30 AM [facility] [phone]  [facility]     Future Orders Please Complete By Expires   Diet - low sodium heart healthy      Increase activity slowly      Discharge instructions      Comments:   Please take medication as instructed.  Read all information on new medication including possible side effects.  If you agree then take the new medication as indicated.  You will need to follow up with your primary care physician for further recommendations given your new hyperthyroidism.  Also please follow up with GI Monday to get a follow up appt with Amy [facility] PA-C or Dr. Christella Hartigan   [phone]   Call MD for:  temperature >100.4      Call MD for:  persistant nausea and vomiting      Call MD for:  persistant dizziness or light-headedness      Call MD for:  extreme fatigue        Medication List  As of 08/07/2011  2:56 PM   STOP taking these medications         furosemide 40 MG tablet      linagliptin 5 MG Tabs tablet      loperamide 2 MG capsule      MOVIPREP 100 G Solr      oxycodone 5 MG capsule         TAKE these medications         aspirin 325 MG EC tablet  Take 325 mg by mouth daily.      atorvastatin 80 MG tablet   Commonly known as: LIPITOR   Take 1 tablet (80 mg total) by mouth daily at 6 PM.      diphenoxylate-atropine 2.5-0.025 MG per tablet   Commonly known as: LOMOTIL   Take 1 tablet by mouth 2 (two) times daily.      fidaxomicin 200 MG Tabs tablet   Commonly known as: DIFICID   Take 1 tablet (200 mg total) by mouth 2 (two) times daily.      fluticasone 50 MCG/ACT nasal spray   Commonly known as: FLONASE   Place 2 sprays into the nose daily.      isosorbide-hydrALAZINE 20-37.5 MG per tablet   Commonly known as: BIDIL   Take 1 tablet by mouth 2 (two) times daily.      methimazole 5 MG tablet   Commonly known as: TAPAZOLE   Take 0.5 tablets (2.5 mg total) by mouth 2 (two) times daily.      metoprolol succinate 50 MG 24 hr tablet   Commonly known as: TOPROL-XL   Take 1 tablet (50 mg  total) by mouth 2 (two) times daily.      nitroGLYCERIN 0.4 MG SL tablet   Commonly known as: NITROSTAT   Place 0.4 mg under the tongue every 5 (five) minutes as needed. For chest pain      saccharomyces boulardii 250 MG capsule   Commonly known as: FLORASTOR   Take 1 capsule (250 mg total) by mouth 2 (two) times daily.              The results of significant diagnostics from this hospitalization (including imaging, microbiology, ancillary and laboratory) are listed below for reference.    Significant Diagnostic Studies: No results found.  Microbiology: Recent Results (from the past 240 hour(s))  CLOSTRIDIUM DIFFICILE BY PCR     Status: Normal   Collection Time   07/29/11 10:45 AM      Component Value Range Status Comment   C difficile by pcr NEGATIVE  NEGATIVE Final   CLOSTRIDIUM DIFFICILE BY PCR     Status: Normal   Collection Time   08/05/11  8:34 PM      Component Value Range Status Comment   C difficile by pcr NEGATIVE  NEGATIVE Final      Labs: Basic Metabolic Panel:  Lab 08/07/11 1610 08/06/11 0404 08/05/11 0341 08/04/11 1955  NA 143 140 140 138  K 3.9 3.8 -- --  CL 115* 113* 112 110  CO2 19 17* 17* 15*  GLUCOSE 99 121* 95 141*  BUN 36* 44* 61* 69*  CREATININE 1.55* 1.70* 2.11* 2.32*  CALCIUM 8.5 8.6 8.6 9.0  MG -- -- -- 1.9  PHOS -- -- -- 5.2*   Liver Function Tests:  Lab 08/05/11 0341 08/04/11 1955  AST 30 37  ALT 79* 98*  ALKPHOS 139* 153*  BILITOT 0.4 0.3  PROT 6.6 7.0  ALBUMIN 3.4* 3.6    Lab 08/04/11 1955  LIPASE 65*  AMYLASE --   No results found for this basename: AMMONIA:5 in the last 168 hours CBC:  Lab 08/05/11 0341 08/04/11 1955  WBC 6.9 7.9  NEUTROABS -- 5.0  HGB 11.1* 11.7*  HCT 31.2* 32.3*  MCV 82.5 83.2  PLT 157 155   Cardiac Enzymes: No results found for this basename: CKTOTAL:5,CKMB:5,CKMBINDEX:5,TROPONINI:5 in the last 168 hours BNP: No components found with this basename: POCBNP:5 CBG:  Lab 08/07/11  1159  08/07/11 0731 08/06/11 2040 08/06/11 1720 08/06/11 1232  GLUCAP 127* 109* 111* 149* 137*    Time coordinating discharge: > 35 minutes  Signed:  Penny Pia  [facility] 08/07/2011, 2:56 PM

## 2011-08-07 NOTE — Progress Notes (Signed)
Patient ID: Kurt Richard, male   DOB: 1945-07-18, 66 y.o.   MRN: [id number] [facility] Progress Note  Subjective: Feeling better-looks more rested, and has been eating. Only one BM during the night about 4 am liquid. He had 2 other bm's after eating dinner last night-soft. No c/o pain.  Objective:  Vital signs in last 24 hours: Temp:  [98.6 F (37 C)-98.7 F (37.1 C)] 98.6 F (37 C) (07/06 0512) Pulse Rate:  [75-80] 76  (07/06 0512) Resp:  [16-18] 18  (07/06 0512) BP: (126-163)/(58-73) 126/58 mmHg (07/06 0512) SpO2:  [98 %-99 %] 98 % (07/06 0512) Last BM Date: 08/06/11 General:   Alert,  Well-developed,    in NAD Heart:  Regular rate and rhythm; no murmurs Pulm;clear Abdomen:  Soft, nontender and nondistended. Normal bowel sounds, Extremities:  Without edema. Neurologic:  Alert and  oriented x4;  grossly normal neurologically. Psych:  Alert and cooperative. Affect brighter  Intake/Output from previous day: 07/05 0701 - 07/06 0700 In: 1188 [P.O.:720; IV Piggyback:468] Out: 4 [Urine:3; Stool:1] Intake/Output this shift:    Lab Results:  Basename 08/05/11 0341 08/04/11 1955  WBC 6.9 7.9  HGB 11.1* 11.7*  HCT 31.2* 32.3*  PLT 157 155   BMET  Basename 08/07/11 0418 08/06/11 0404 08/05/11 0341  NA 143 140 140  K 3.9 3.8 3.6  CL 115* 113* 112  CO2 19 17* 17*  GLUCOSE 99 121* 95  BUN 36* 44* 61*  CREATININE 1.55* 1.70* 2.11*  CALCIUM 8.5 8.6 8.6   LFT  Basename 08/05/11 0341  PROT 6.6  ALBUMIN 3.4*  AST 30  ALT 79*  ALKPHOS 139*  BILITOT 0.4  BILIDIR --  IBILI --   Hepatitis Panel  Basename 08/04/11 2210  HEPBSAG NEGATIVE  HCVAB NEGATIVE  HEPAIGM NEGATIVE  HEPBIGM NEGATIVE    Assessment / Plan: 66 yo male with multiple medical problems with hx of diarrhea x 3 months , started at discharge post CABG in 3 /13.  He has had cdiff and has been treated with multiple agents- last 2 c diff PCR's negative,. -he is currently finishing a 14 day  course of Dificid Would continue Florastor for 2-3 more weeks Continue Lomotil BID Leave off Tradjenta Hyperthyroidism is being treated with low dose tapazole Low residue diet We will see him in follow up in 2 weeks- he will call Monday to get a follow up appt with Amy [facility] PA-C or Dr. Christella Hartigan   [phone] We will be available as needed Principal Problem:  *Diarrhea Active Problems:  Hyperlipidemia  Hypertension  CAD (coronary artery disease)  Chronic systolic heart failure  Dehydration  Acute on chronic kidney failure  Diabetes mellitus  Abnormal TSH  Clostridium difficile diarrhea  Subclinical hyperthyroidism     LOS: 3 days   Amy Esterwood  08/07/2011, 9:06 AM    GI ATTENDING.  Patient seen and examined. Discharge home. Agree with above. Needs to stop Lomotil for constipation. Hopefully we'll be able we and off. GI followup as planned.  Wilhemina Bonito. Eda Keys., M.D. [facility] of Gastroenterology

## 2011-08-09 ENCOUNTER — Telehealth (HOSPITAL_COMMUNITY): Payer: Self-pay | Admitting: *Deleted

## 2011-08-09 ENCOUNTER — Telehealth: Payer: Self-pay | Admitting: Gastroenterology

## 2011-08-09 ENCOUNTER — Encounter (HOSPITAL_COMMUNITY): Payer: 59

## 2011-08-09 NOTE — Telephone Encounter (Signed)
Returning call from message left this morning.  Inquire of how he is doing and a return date for cardiac rehab.

## 2011-08-09 NOTE — Telephone Encounter (Signed)
Pt is calling to set up a post hosp f/u appt he is calling to move another appt and will call back to schedule.

## 2011-08-11 ENCOUNTER — Encounter (HOSPITAL_COMMUNITY): Admission: RE | Admit: 2011-08-11 | Payer: 59 | Source: Ambulatory Visit

## 2011-08-13 ENCOUNTER — Encounter (HOSPITAL_COMMUNITY): Payer: 59

## 2011-08-16 ENCOUNTER — Encounter (HOSPITAL_COMMUNITY): Payer: 59

## 2011-08-17 ENCOUNTER — Other Ambulatory Visit: Payer: Self-pay | Admitting: Cardiovascular Disease

## 2011-08-17 NOTE — Telephone Encounter (Signed)
Fax Received. Refill Completed. Kurt Richard (R.M.A)   

## 2011-08-18 ENCOUNTER — Encounter (HOSPITAL_COMMUNITY): Payer: 59

## 2011-08-18 ENCOUNTER — Ambulatory Visit (INDEPENDENT_AMBULATORY_CARE_PROVIDER_SITE_OTHER): Payer: 59 | Admitting: *Deleted

## 2011-08-18 DIAGNOSIS — I255 Ischemic cardiomyopathy: Secondary | ICD-10-CM

## 2011-08-18 DIAGNOSIS — I2510 Atherosclerotic heart disease of native coronary artery without angina pectoris: Secondary | ICD-10-CM

## 2011-08-18 DIAGNOSIS — I2589 Other forms of chronic ischemic heart disease: Secondary | ICD-10-CM

## 2011-08-18 LAB — BASIC METABOLIC PANEL
BUN: 34 mg/dL — ABNORMAL HIGH (ref 6–23)
Chloride: 111 mEq/L (ref 96–112)
GFR: 53.29 mL/min — ABNORMAL LOW (ref >60.00)
Glucose, Bld: 181 mg/dL — ABNORMAL HIGH (ref 70–99)
Potassium: 4.8 mEq/L (ref 3.5–5.1)
Sodium: 139 mEq/L (ref 135–145)

## 2011-08-18 LAB — BRAIN NATRIURETIC PEPTIDE: Pro B Natriuretic peptide (BNP): 1319 pg/mL — ABNORMAL HIGH (ref 0.0–100.0)

## 2011-08-19 ENCOUNTER — Telehealth: Payer: Self-pay | Admitting: *Deleted

## 2011-08-19 NOTE — ED Provider Notes (Signed)
Medical screening examination/treatment/procedure(s) were performed by non-physician practitioner and as supervising physician I was immediately available for consultation/collaboration.  Juliet Rude. Rubin Payor, MD 08/19/11 [id number]

## 2011-08-19 NOTE — Telephone Encounter (Signed)
Spoke with mr Laural Benes, Pt has been in hospital again, diarrhea continues nightly 40 min after dinner, meds reviewed, pt takes several bid meds and tolerates morning dose, the only other med taken at dinner is lipitor, told him to hold med 2 weeks and we will see in 1 week 7/25  if any difference, Dr Elease Hashimoto updated and agreed to hold med to see if any allergy to lipitor. Lab results reviewed.

## 2011-08-19 NOTE — Telephone Encounter (Signed)
Message copied by Antony Odea on Thu Aug 19, 2011 11:48 AM ------      Message from: [name]      Created: Wed Aug 18, 2011  6:41 PM       Labs are stable

## 2011-08-20 ENCOUNTER — Encounter (HOSPITAL_COMMUNITY): Payer: 59

## 2011-08-23 ENCOUNTER — Encounter (HOSPITAL_COMMUNITY): Payer: 59

## 2011-08-25 ENCOUNTER — Ambulatory Visit (INDEPENDENT_AMBULATORY_CARE_PROVIDER_SITE_OTHER): Payer: 59 | Admitting: Gastroenterology

## 2011-08-25 ENCOUNTER — Encounter: Payer: Self-pay | Admitting: Gastroenterology

## 2011-08-25 ENCOUNTER — Telehealth: Payer: Self-pay | Admitting: Cardiovascular Disease

## 2011-08-25 ENCOUNTER — Other Ambulatory Visit: Payer: 59

## 2011-08-25 ENCOUNTER — Encounter (HOSPITAL_COMMUNITY): Payer: 59

## 2011-08-25 VITALS — BP 100/52 | HR 80 | Ht 68.0 in | Wt 148.4 lb

## 2011-08-25 DIAGNOSIS — R197 Diarrhea, unspecified: Secondary | ICD-10-CM

## 2011-08-25 DIAGNOSIS — R110 Nausea: Secondary | ICD-10-CM

## 2011-08-25 MED ORDER — DIPHENOXYLATE-ATROPINE 2.5-0.025 MG PO TABS: 1.0000 | ORAL_TABLET | Freq: Two times a day (BID) | ORAL | Status: DC

## 2011-08-25 NOTE — Progress Notes (Signed)
HPI: This is a  very pleasant 66 year old man whom I last saw 1-2 months ago.   Was feeling well for about 3 days, but then diarrhea resumed.  Have tried avoiding lipitor with minimal improvement, temporary however.  Resumed lomotil one pill twice daily.  Not on immodium currently.  \weight fluctuates.      Past Medical History  Diagnosis Date  . Diabetes mellitus   . Hypertension   . Chronic systolic heart failure     echo 04/12/11: Mild LVH, EF 30-35%, mid to distal anteroseptal and apical hypokinesis, grade 2 diastolic dysfunction, moderate LAE, mild RAE.  [name] Hyperlipidemia   . Tobacco abuse   . CAD (coronary artery disease)     acute anterior STEMI, late presentation 04/06/11 LHC 3/5 demonstrated severe ostial left main 95% stenosis and otherwise three-vessel CAD with an ejection fraction of 15%.  Emergent CABG: Dr. Dorris Fetch - Grafts: LIMA-LAD, SVG-D1, SVG-OM1, SVG-PDA and PL.  . Ischemic cardiomyopathy   . DM2 (diabetes mellitus, type 2)   . 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 Acute renal insufficiency     05/2011 admission (cr 1.54 at discharge)  . Anemia     Post-CABG  . Arthritis   . Stroke   . Sleep apnea   . Neuromuscular disorder     diabetic neuropathy  . CHF (congestive heart failure)     Past Surgical History  Procedure Date  . Coronary artery bypass graft 04/06/2011    Procedure: CORONARY ARTERY BYPASS GRAFTING (CABG);  Surgeon: Loreli Slot, MD;  Location: [facility] OR;  Service: Open Heart Surgery;  Laterality: N/A;  Coronary Artery Bypass Graft times four utilizing the left internal mammary artery and the Right and left  greater Saphenous veins Harvested endoscopically.  . Fibroid bypass 04/06/11   . Colon surgery     rectal fissure  . Cervical disc surgery   . Colonoscopy 07/29/2011    Procedure: COLONOSCOPY;  Surgeon: Rachael Fee, MD;  Location: [facility];  Service: Endoscopy;  Laterality: N/A;    Current Outpatient Prescriptions   Medication Sig Dispense Refill  . aspirin 325 MG EC tablet Take 325 mg by mouth daily.      [name] BIDIL 20-37.5 MG per tablet TAKE 1 TABLET BY MOUTH TWICE A DAY  24 tablet  5  . diphenoxylate-atropine (LOMOTIL) 2.5-0.025 MG per tablet Take 1 tablet by mouth 4 (four) times daily as needed.      . fluticasone (FLONASE) 50 MCG/ACT nasal spray Place 2 sprays into the nose daily.  16 g  6  . methimazole (TAPAZOLE) 5 MG tablet Take 0.5 tablets (2.5 mg total) by mouth 2 (two) times daily.  24 tablet  0  . metoprolol succinate (TOPROL-XL) 50 MG 24 hr tablet Take 1 tablet (50 mg total) by mouth 2 (two) times daily.  60 tablet  5  . nitroGLYCERIN (NITROSTAT) 0.4 MG SL tablet Place 0.4 mg under the tongue every 5 (five) minutes as needed. For chest pain      . atorvastatin (LIPITOR) 80 MG tablet Take 1 tablet (80 mg total) by mouth daily at 6 PM.  30 tablet  5    Allergies as of 08/25/2011 - Review Complete 08/25/2011  Allergen Reaction Noted  . Diltiazem hcl Hives and Swelling 04/06/2011  . Nifedipine Hives and Swelling 04/06/2011    Family History  Problem Relation Age of Onset  . Heart attack Mother     ?102s  .  Heart attack Sister     84s  . Diabetes Mother   . Diabetes Sister     [place]    History   Social History  . Marital Status: Single    Spouse Name: N/A    Number of Children: 0  . Years of Education: N/A   Occupational History  . Not on file.   Social History Main Topics  . Smoking status: Former Smoker -- 0.5 packs/day    Types: Cigarettes    Quit date: 04/06/2011  . Smokeless tobacco: Never Used  . Alcohol Use: No  . Drug Use: No  . Sexually Active: No   Other Topics Concern  . Not on file   Social History Narrative  . No narrative on file      Physical Exam: BP 100/52  Pulse 80  Ht 5\' 8"  (1.727 m)  Wt 148 lb 6.4 oz (67.314 kg)  BMI 22.56 kg/m2 Constitutional: generally well-appearing Psychiatric: alert and oriented x3 Abdomen: soft, nontender,  nondistended, no obvious ascites, no peritoneal signs, normal bowel sounds     Assessment and plan: 66 y.o. male with persistent diarrhea  He was previously positive for Clostridium difficile however since then multiple checks have been negative. Colonoscopy with a look in the terminal ileum 1-2 months ago did not show Clostridium difficile clearly, the cecum was slightly irregular but biopsies showed no obvious significant pathology. Diarrhea was felt possibly to be related to some diabetes medicines, possibly Lipitor both of which been held and yet he is still having intermittent, debilitating loose stools. Perhaps he has malabsorptive process, perhaps celiac sprue or pancreatic insufficiency, pancreatic neoplasm. I personally seen pancreatic cancer cause diarrhea such as this twice. Today he is going to increase his Lomotil to 3-4 pills a day. He is going to have blood workup for celiac sprue and I will get a CT scan of abdomen pelvis checking for pancreatic disease. He'll return to see me to 3 weeks and sooner if needed.

## 2011-08-25 NOTE — Telephone Encounter (Signed)
lipitor was stopped/ no diarrhea for 3 days then terrible diarrhea again daily. Pt told to hold till diarrhea stops. Pt f/u with gastro and going through more testing. Pt will call with updates.

## 2011-08-25 NOTE — Patient Instructions (Addendum)
You will have labs checked today in the basement lab.  Please head down after you check out with the front desk  (celiac panel) You will be set up for a CT scan of abdomen and pelvis with IV and oral contrast for diarrhea, nausea.   You have been scheduled for a CT scan of the abdomen and pelvis at [place] ([address]---this is in the same building as Architectural technologist).   You are scheduled on 08/26/11  at  1030 am . You should arrive 15 minutes prior to your appointment time for registration. Please follow the written instructions below on the day of your exam:  WARNING: IF YOU ARE ALLERGIC TO IODINE/X-RAY DYE, PLEASE NOTIFY RADIOLOGY IMMEDIATELY AT [phone]! YOU WILL BE GIVEN A 13 HOUR PREMEDICATION PREP.  1) Do not eat or drink anything after 630 am  (4 hours prior to your test) 2) You have been given 2 bottles of oral contrast to drink. The solution may taste better if refrigerated, but do NOT add ice or any other liquid to this solution. Shake  well before drinking.    Drink 1 bottle of contrast @ 830 am  (2 hours prior to your exam)  Drink 1 bottle of contrast @ 930 am (1 hour prior to your exam)  You may take any medications as prescribed with a small amount of water except for the following: Metformin, Glucophage, Glucovance, Avandamet, Riomet, Fortamet, Actoplus Met, Janumet, Glumetza or Metaglip. The above medications must be held the day of the exam AND 48 hours after the exam.  The purpose of you drinking the oral contrast is to aid in the visualization of your intestinal tract. The contrast solution may cause some diarrhea. Before your exam is started, you will be given a small amount of fluid to drink. Depending on your individual set of symptoms, you may also receive an intravenous injection of x-ray contrast/dye. Plan on being at [facility] for 30 minutes or long, depending on the type of exam you are having performed.  If you have any questions  regarding your exam or if you need to reschedule, you may call the CT department at [phone] between the hours of 8:00 am and 5:00 pm, Monday-Friday.  ________________________________________________________________________  Can increase the lomotil to 3-4 pills a day, new script written. Return to see Dr. Christella Hartigan in 2-3 weeks.

## 2011-08-25 NOTE — Telephone Encounter (Signed)
Fu call °Pt returning your call  °

## 2011-08-26 ENCOUNTER — Ambulatory Visit (INDEPENDENT_AMBULATORY_CARE_PROVIDER_SITE_OTHER)
Admission: RE | Admit: 2011-08-26 | Discharge: 2011-08-26 | Disposition: A | Discharge status: Home / Self Care | Payer: 59 | Source: Ambulatory Visit | Attending: Gastroenterology | Admitting: Gastroenterology

## 2011-08-26 DIAGNOSIS — R197 Diarrhea, unspecified: Secondary | ICD-10-CM

## 2011-08-26 DIAGNOSIS — R110 Nausea: Secondary | ICD-10-CM

## 2011-08-26 LAB — CELIAC PANEL 10
Endomysial Screen: NEGATIVE
Gliadin IgG: 6.6 U/mL (ref <20)
Tissue Transglutaminase Ab, IgA: 4.5 U/mL (ref <20)

## 2011-08-26 MED ORDER — IOHEXOL 300 MG/ML  SOLN
100.0000 mL | Freq: Once | INTRAMUSCULAR | Status: AC | PRN
  Administered 2011-08-26: 100 mL via INTRAVENOUS

## 2011-08-27 ENCOUNTER — Other Ambulatory Visit: Payer: Self-pay

## 2011-08-27 ENCOUNTER — Encounter (HOSPITAL_COMMUNITY): Payer: 59

## 2011-08-27 ENCOUNTER — Telehealth: Payer: Self-pay | Admitting: Gastroenterology

## 2011-08-27 DIAGNOSIS — K8050 Calculus of bile duct without cholangitis or cholecystitis without obstruction: Secondary | ICD-10-CM

## 2011-08-27 NOTE — Telephone Encounter (Signed)
i spoke with him about multiple CBD stones, he needs ERCP  Can you help set up ERCP with MAC next week at [facility] for stone removal, I am hosp doc.  Thursday should have some openings.  Thanks

## 2011-08-27 NOTE — Telephone Encounter (Signed)
Pt has been notified of the instructions and meds reviewed he will call with any questions after reviewing the instructions sent in the mail

## 2011-08-27 NOTE — Telephone Encounter (Signed)
I LMOM for his cell number to discuss CT results (he is going to need ERCP).  Home number called, very unusual recording and I didn't think it was truly his number.

## 2011-08-30 ENCOUNTER — Other Ambulatory Visit: Payer: Self-pay | Admitting: Family Medicine

## 2011-08-30 ENCOUNTER — Other Ambulatory Visit: Payer: Self-pay | Admitting: *Deleted

## 2011-08-30 ENCOUNTER — Encounter (HOSPITAL_COMMUNITY): Admission: RE | Admit: 2011-08-30 | Payer: 59 | Source: Ambulatory Visit

## 2011-08-30 ENCOUNTER — Ambulatory Visit: Payer: 59

## 2011-08-30 DIAGNOSIS — A0472 Enterocolitis due to Clostridium difficile, not specified as recurrent: Secondary | ICD-10-CM

## 2011-08-30 DIAGNOSIS — R197 Diarrhea, unspecified: Secondary | ICD-10-CM

## 2011-08-31 ENCOUNTER — Telehealth: Payer: Self-pay | Admitting: *Deleted

## 2011-08-31 ENCOUNTER — Telehealth: Payer: Self-pay | Admitting: Gastroenterology

## 2011-08-31 NOTE — Telephone Encounter (Signed)
Diarrhea continues at night even with holding lipitor, pt is following up with endo.

## 2011-08-31 NOTE — Telephone Encounter (Signed)
Pt's caregiver would like to have instructions left at the front desk for pick up today.  I have printed those and left at the front

## 2011-09-01 ENCOUNTER — Telehealth: Payer: Self-pay

## 2011-09-01 ENCOUNTER — Encounter (HOSPITAL_COMMUNITY): Payer: 59

## 2011-09-01 ENCOUNTER — Other Ambulatory Visit: Payer: Self-pay | Admitting: *Deleted

## 2011-09-01 MED ORDER — METHIMAZOLE 5 MG PO TABS: ORAL_TABLET | ORAL | Status: DC

## 2011-09-01 MED ORDER — VANCOMYCIN HCL 125 MG PO CAPS: ORAL_CAPSULE | ORAL | Status: DC

## 2011-09-01 NOTE — Telephone Encounter (Signed)
Pt is aware of the cx of the procedure and will pick up the medication.  Appt for follow up has already been scheduled

## 2011-09-01 NOTE — Telephone Encounter (Signed)
Left message on machine to call back  

## 2011-09-01 NOTE — Telephone Encounter (Signed)
Please restart vancomycin 125mg  po qid for 2 weeks, then tid for 2 weeks, then, bid for 2 weeks, then qday for 2 weeks, then qod for 2 weeks.    Should cancel his ERCP for tomorrow with this new information.  The CBD stones can wait to be taken out later and were probably NOT causing his diarrhea anyway, put him on for rov with me for 2-3 weeks from now.

## 2011-09-01 NOTE — Telephone Encounter (Signed)
Revonda Standard with the lab called a positive C Diff on the pt

## 2011-09-01 NOTE — Telephone Encounter (Signed)
Procedure cx at [facility] Vanc sent to the pharmacy

## 2011-09-02 ENCOUNTER — Ambulatory Visit (HOSPITAL_COMMUNITY): Admission: RE | Admit: 2011-09-02 | Payer: 59 | Source: Ambulatory Visit | Admitting: Gastroenterology

## 2011-09-02 ENCOUNTER — Encounter (HOSPITAL_COMMUNITY): Admission: RE | Payer: Self-pay | Source: Ambulatory Visit

## 2011-09-02 SURGERY — ENDOSCOPIC RETROGRADE CHOLANGIOPANCREATOGRAPHY (ERCP) WITH PROPOFOL
Anesthesia: General

## 2011-09-03 ENCOUNTER — Encounter (HOSPITAL_COMMUNITY): Admission: RE | Admit: 2011-09-03 | Payer: 59 | Source: Ambulatory Visit

## 2011-09-06 ENCOUNTER — Encounter (HOSPITAL_COMMUNITY): Payer: 59

## 2011-09-08 ENCOUNTER — Encounter (HOSPITAL_COMMUNITY): Payer: 59

## 2011-09-10 ENCOUNTER — Encounter (HOSPITAL_COMMUNITY): Payer: 59

## 2011-09-10 ENCOUNTER — Other Ambulatory Visit: Payer: Self-pay | Admitting: Gastroenterology

## 2011-09-10 NOTE — Telephone Encounter (Signed)
Pt aware that the lomotil was faxed to the pharmacy today

## 2011-09-13 ENCOUNTER — Ambulatory Visit (HOSPITAL_COMMUNITY): Payer: 59

## 2011-09-13 ENCOUNTER — Telehealth (HOSPITAL_COMMUNITY): Payer: Self-pay | Admitting: *Deleted

## 2011-09-13 ENCOUNTER — Ambulatory Visit (INDEPENDENT_AMBULATORY_CARE_PROVIDER_SITE_OTHER): Payer: 59 | Admitting: Family Medicine

## 2011-09-13 ENCOUNTER — Encounter: Payer: Self-pay | Admitting: Family Medicine

## 2011-09-13 VITALS — BP 120/52 | Temp 98.5°F | Wt 159.0 lb

## 2011-09-13 DIAGNOSIS — R7989 Other specified abnormal findings of blood chemistry: Secondary | ICD-10-CM

## 2011-09-13 DIAGNOSIS — I10 Essential (primary) hypertension: Secondary | ICD-10-CM

## 2011-09-13 DIAGNOSIS — R6889 Other general symptoms and signs: Secondary | ICD-10-CM

## 2011-09-13 DIAGNOSIS — R197 Diarrhea, unspecified: Secondary | ICD-10-CM

## 2011-09-13 DIAGNOSIS — E119 Type 2 diabetes mellitus without complications: Secondary | ICD-10-CM

## 2011-09-13 DIAGNOSIS — E785 Hyperlipidemia, unspecified: Secondary | ICD-10-CM

## 2011-09-13 LAB — BASIC METABOLIC PANEL
BUN: 34 mg/dL — ABNORMAL HIGH (ref 6–23)
Chloride: 114 mEq/L — ABNORMAL HIGH (ref 96–112)
Glucose, Bld: 159 mg/dL — ABNORMAL HIGH (ref 70–99)
Potassium: 5.6 mEq/L — ABNORMAL HIGH (ref 3.5–5.1)

## 2011-09-13 MED ORDER — GLUCOSE BLOOD VI STRP: ORAL_STRIP | Status: DC

## 2011-09-13 NOTE — Telephone Encounter (Signed)
Left message regarding his discharge from cardiac rehab due to non attendance because of his medical condition. Pt last exercise session was on 07/23/11.

## 2011-09-13 NOTE — Progress Notes (Signed)
Subjective:    Patient ID: Kurt Richard, male    DOB: 11/01/1945, 66 y.o.   MRN: [id number]  HPI  Has multiple medical problems including history of CAD, ischemic cardiomyopathy, history of congestive heart failure, hypertension, hyperlipidemia, history of nicotine use, history of acute on chronic kidney failure, anemia, and ongoing diarrhea issues. He had recent positive screen for Clostridium C. Difficile.  Followed by GI. Has been on multiple treatments including initially Flagyl and subsequently several courses of vancomycin. He also was treated with Fidaxomicin but has had recurrences each time. Sometimes up to 12 stools per day. Recent CT abdomen and pelvis revealed some gallstones but no acute findings. Patient has fair appetite off and on. Keeping down fluids well. No bloody stools. No fever.  During recent hospitalization had thyroid functions which revealed mild hyperthyroidism. Patient was started on methimazole 5 mg one half tablet twice daily. This did not improve his diarrhea symptoms.  Diabetes stable. Was taken off all diabetes medications and again stopping metformin did not help his diarrhea. Recent A1c month ago 6.6%. Patient needs new home glucose meter. No symptoms of hyperglycemia.  Past Medical History  Diagnosis Date  . Diabetes mellitus   . Hypertension   . Chronic systolic heart failure     echo 04/12/11: Mild LVH, EF 30-35%, mid to distal anteroseptal and apical hypokinesis, grade 2 diastolic dysfunction, moderate LAE, mild RAE.  [name] Hyperlipidemia   . Tobacco abuse   . CAD (coronary artery disease)     acute anterior STEMI, late presentation 04/06/11 LHC 3/5 demonstrated severe ostial left main 95% stenosis and otherwise three-vessel CAD with an ejection fraction of 15%.  Emergent CABG: Dr. Dorris Fetch - Grafts: LIMA-LAD, SVG-D1, SVG-OM1, SVG-PDA and PL.  . Ischemic cardiomyopathy   . DM2 (diabetes mellitus, type 2)   . 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  Acute renal insufficiency     05/2011 admission (cr 1.54 at discharge)  . Anemia     Post-CABG  . Arthritis   . Stroke   . Sleep apnea   . Neuromuscular disorder     diabetic neuropathy  . CHF (congestive heart failure)    Past Surgical History  Procedure Date  . Coronary artery bypass graft 04/06/2011    Procedure: CORONARY ARTERY BYPASS GRAFTING (CABG);  Surgeon: Loreli Slot, MD;  Location: [facility] OR;  Service: Open Heart Surgery;  Laterality: N/A;  Coronary Artery Bypass Graft times four utilizing the left internal mammary artery and the Right and left  greater Saphenous veins Harvested endoscopically.  . Fibroid bypass 04/06/11   . Colon surgery     rectal fissure  . Cervical disc surgery   . Colonoscopy 07/29/2011    Procedure: COLONOSCOPY;  Surgeon: Rachael Fee, MD;  Location: [facility];  Service: Endoscopy;  Laterality: N/A;    reports that he quit smoking about 5 months ago. His smoking use included Cigarettes. He smoked .5 packs per day. He has never used smokeless tobacco. He reports that he does not drink alcohol or use illicit drugs. family history includes Diabetes in his mother and sister and Heart attack in his mother and sister. Allergies  Allergen Reactions  . Diltiazem Hcl Hives and Swelling  . Nifedipine Hives and Swelling      Review of Systems  Constitutional: Positive for fatigue. Negative for fever and chills.  Respiratory: Negative for cough and shortness of breath.   Cardiovascular: Negative for chest pain.  Gastrointestinal:  Positive for diarrhea. Negative for nausea, vomiting, abdominal pain, blood in stool and abdominal distention.  Genitourinary: Negative for dysuria.       Objective:   Physical Exam  Constitutional: He appears well-developed and well-nourished.  HENT:  Mouth/Throat: Oropharynx is clear and moist.  Neck: Neck supple. No thyromegaly present.  Cardiovascular: Normal rate and regular rhythm.   Pulmonary/Chest: Effort  normal and breath sounds normal. No respiratory distress. He has no wheezes. He has no rales.  Abdominal: Soft. Bowel sounds are normal. He exhibits no distension and no mass. There is no tenderness. There is no rebound and no guarding.  Musculoskeletal: He exhibits no edema.          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 Followup there next week. Remains on vancomycin #2 hyperthyroidism which is mild by recent labs  patient on medical therapy. Repeat TSH and T4  #3 type 2 diabetes. Currently stable off medications. Glucose monitor given. We'll plan to  repeat A1c at followup

## 2011-09-14 ENCOUNTER — Other Ambulatory Visit: Payer: Self-pay | Admitting: *Deleted

## 2011-09-14 MED ORDER — GLUCOSE BLOOD VI STRP: ORAL_STRIP | Status: DC

## 2011-09-15 ENCOUNTER — Inpatient Hospital Stay (HOSPITAL_COMMUNITY): Admission: RE | Admit: 2011-09-15 | Payer: 59 | Source: Ambulatory Visit

## 2011-09-15 ENCOUNTER — Other Ambulatory Visit: Payer: Self-pay | Admitting: *Deleted

## 2011-09-15 DIAGNOSIS — E875 Hyperkalemia: Secondary | ICD-10-CM

## 2011-09-15 NOTE — Progress Notes (Signed)
Quick Note:  Pt partner DPR Mindi Curling informed ______

## 2011-09-16 ENCOUNTER — Other Ambulatory Visit (INDEPENDENT_AMBULATORY_CARE_PROVIDER_SITE_OTHER): Payer: 59

## 2011-09-16 DIAGNOSIS — E875 Hyperkalemia: Secondary | ICD-10-CM

## 2011-09-16 LAB — BASIC METABOLIC PANEL
BUN: 39 mg/dL — ABNORMAL HIGH (ref 6–23)
CO2: 19 mEq/L (ref 19–32)
Glucose, Bld: 128 mg/dL — ABNORMAL HIGH (ref 70–99)
Potassium: 5.9 mEq/L — ABNORMAL HIGH (ref 3.5–5.1)
Sodium: 140 mEq/L (ref 135–145)

## 2011-09-17 ENCOUNTER — Ambulatory Visit (HOSPITAL_COMMUNITY): Payer: 59

## 2011-09-20 ENCOUNTER — Ambulatory Visit (INDEPENDENT_AMBULATORY_CARE_PROVIDER_SITE_OTHER): Payer: 59 | Admitting: Gastroenterology

## 2011-09-20 ENCOUNTER — Other Ambulatory Visit (INDEPENDENT_AMBULATORY_CARE_PROVIDER_SITE_OTHER): Payer: 59

## 2011-09-20 ENCOUNTER — Encounter: Payer: Self-pay | Admitting: Gastroenterology

## 2011-09-20 ENCOUNTER — Encounter (HOSPITAL_COMMUNITY): Payer: 59

## 2011-09-20 ENCOUNTER — Other Ambulatory Visit: Payer: Self-pay | Admitting: Family Medicine

## 2011-09-20 VITALS — BP 126/60 | HR 80 | Ht 66.25 in | Wt 163.2 lb

## 2011-09-20 DIAGNOSIS — K8050 Calculus of bile duct without cholangitis or cholecystitis without obstruction: Secondary | ICD-10-CM

## 2011-09-20 DIAGNOSIS — R197 Diarrhea, unspecified: Secondary | ICD-10-CM

## 2011-09-20 LAB — CBC WITH DIFFERENTIAL/PLATELET
Eosinophils Relative: 5 % (ref 0.0–5.0)
HCT: 27.6 % — ABNORMAL LOW (ref 39.0–52.0)
Lymphs Abs: 1.6 10*3/uL (ref 0.7–4.0)
MCV: 92.9 fl (ref 78.0–100.0)
Monocytes Absolute: 0.6 10*3/uL (ref 0.1–1.0)
Platelets: 275 10*3/uL (ref 150.0–400.0)
WBC: 8 10*3/uL (ref 4.5–10.5)

## 2011-09-20 LAB — COMPREHENSIVE METABOLIC PANEL
ALT: 12 U/L (ref 0–53)
Alkaline Phosphatase: 100 U/L (ref 39–117)
Glucose, Bld: 124 mg/dL — ABNORMAL HIGH (ref 70–99)
Sodium: 140 mEq/L (ref 135–145)
Total Bilirubin: 0.5 mg/dL (ref 0.3–1.2)
Total Protein: 6.9 g/dL (ref 6.0–8.3)

## 2011-09-20 MED ORDER — DIPHENOXYLATE-ATROPINE 2.5-0.025 MG PO TABS: 1.0000 | ORAL_TABLET | Freq: Four times a day (QID) | ORAL | Status: DC

## 2011-09-20 NOTE — Progress Notes (Signed)
Review of pertinent gastrointestinal problems:  1. C. Diff positive by PCR June 2013. This was despite 2 courses of oral Flagyl and one course of vancomycin. Was put on long tapering course of vancomycin previously positive for Clostridium difficile however since then multiple checks have been negative. Colonoscopy 2013 June with a look in the terminal ileum 1-2 months ago did not show Clostridium difficile clearly, the cecum was slightly irregular but biopsies showed no obvious significant pathology.  Sep 02, 2011 c diff PCR again POSITIVE, started long tapering vacn course again.  July 2013 celiac sprue testing negative serologically 2. Multiple CBD stones, incidentally(?) noted on CT done for diarrhea workup.  ERCP scheduled for early August however C. Diff again positive and so ERCP postponed.  HPI: This is a very pleasant 66 year old man whom I saw about one to 2 months ago. He has difficult to control diarrhea. He has been Clostridium difficile by PCR positive twice, however colonoscopy in between those was fairly unrevealing see above. He did not really respond to vancomycin or fidomicin and I am not really sure  if Clostridium difficile is really a problem here.   Can have good days (1-2 in a row at most), then bad primarily nocturnal diarrhea.  Nighttime meal is primary meal.      Past Medical History  Diagnosis Date  . Diabetes mellitus   . Hypertension   . Chronic systolic heart failure     echo 04/12/11: Mild LVH, EF 30-35%, mid to distal anteroseptal and apical hypokinesis, grade 2 diastolic dysfunction, moderate LAE, mild RAE.  [name] Hyperlipidemia   . Tobacco abuse   . CAD (coronary artery disease)     acute anterior STEMI, late presentation 04/06/11 LHC 3/5 demonstrated severe ostial left main 95% stenosis and otherwise three-vessel CAD with an ejection fraction of 15%.  Emergent CABG: Dr. Dorris Fetch - Grafts: LIMA-LAD, SVG-D1, SVG-OM1, SVG-PDA and PL.  . Ischemic cardiomyopathy   .  DM2 (diabetes mellitus, type 2)   . 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 Acute renal insufficiency     05/2011 admission (cr 1.54 at discharge)  . Anemia     Post-CABG  . Arthritis   . Stroke   . Sleep apnea   . Neuromuscular disorder     diabetic neuropathy  . CHF (congestive heart failure)     Past Surgical History  Procedure Date  . Coronary artery bypass graft 04/06/2011    Procedure: CORONARY ARTERY BYPASS GRAFTING (CABG);  Surgeon: Loreli Slot, MD;  Location: [facility] OR;  Service: Open Heart Surgery;  Laterality: N/A;  Coronary Artery Bypass Graft times four utilizing the left internal mammary artery and the Right and left  greater Saphenous veins Harvested endoscopically.  . Fibroid bypass 04/06/11   . Colon surgery     rectal fissure  . Cervical disc surgery   . Colonoscopy 07/29/2011    Procedure: COLONOSCOPY;  Surgeon: Rachael Fee, MD;  Location: [facility];  Service: Endoscopy;  Laterality: N/A;    Current Outpatient Prescriptions  Medication Sig Dispense Refill  . aspirin 325 MG EC tablet Take 325 mg by mouth daily.      [name] BIDIL 20-37.5 MG per tablet TAKE 1 TABLET BY MOUTH TWICE A DAY  24 tablet  5  . Blood Glucose Monitoring Suppl (CONTOUR BLOOD GLUCOSE SYSTEM) DEVI Dispensed 09/13/11      . diphenoxylate-atropine (LOMOTIL) 2.5-0.025 MG per tablet Take 1 tablet by mouth 2 (two) times  daily.  90 tablet  11  . fluticasone (FLONASE) 50 MCG/ACT nasal spray Place 2 sprays into the nose daily.  16 g  6  . glucose blood (BAYER CONTOUR TEST) test strip Use daily as instructed  100 each  3  . methimazole (TAPAZOLE) 5 MG tablet Take 1/2 tab by mouth twice daily  Pt will need return OV before any more refills.  Please inform pt and thank you.      . metoprolol succinate (TOPROL-XL) 50 MG 24 hr tablet Take 1 tablet (50 mg total) by mouth 2 (two) times daily.  60 tablet  5  . nitroGLYCERIN (NITROSTAT) 0.4 MG SL tablet Place 0.4 mg under the tongue every 5 (five)  minutes as needed. For chest pain      . vancomycin (VANCOCIN HCL) 125 MG capsule 4 pills everyday for 2 weeks then 3 pills every day for 2 weeks then 2 pills everyday for 2 weeks the 1 pill every  day for 2 weeks then every other day for 2 weeks  150 capsule  0    Allergies as of 09/20/2011 - Review Complete 09/20/2011  Allergen Reaction Noted  . Diltiazem hcl Hives and Swelling 04/06/2011  . Nifedipine Hives and Swelling 04/06/2011    Family History  Problem Relation Age of Onset  . Heart attack Mother     ?92s  . Heart attack Sister     18s  . Diabetes Mother   . Diabetes Sister     [place]    History   Social History  . Marital Status: Single    Spouse Name: N/A    Number of Children: 0  . Years of Education: N/A   Occupational History  . Not on file.   Social History Main Topics  . Smoking status: Former Smoker -- 0.5 packs/day    Types: Cigarettes    Quit date: 04/06/2011  . Smokeless tobacco: Never Used  . Alcohol Use: No  . Drug Use: No  . Sexually Active: No   Other Topics Concern  . Not on file   Social History Narrative  . No narrative on file      Physical Exam: BP 126/60  Pulse 80  Ht 5' 6.25" (1.683 m)  Wt 163 lb 4 oz (74.05 kg)  BMI 26.15 kg/m2 Constitutional: generally well-appearing Psychiatric: alert and oriented x3 Abdomen: soft, nontender, nondistended, no obvious ascites, no peritoneal signs, normal bowel sounds     Assessment and plan: 66 y.o. male with chronic diarrhea  I am not really sure if clostridium difficile is causing his problem here. He did not respond to the usual antibiotics, colonoscopy was fairly underwhelming. He was C. difficile PCR positive twice over that is a very sensitive test and perhaps these are false positives. Celiac sprue testing has been negative. Other stool testing was negative. On a CT scan which I did his common bile duct was found to be filled with gallstones. Perhaps this is driving some of his  diarrhea much in the way that cholecystectomy can cause diarrhea. I was planning ERCP 2-3 weeks ago but when the second Clostridium difficile PCR came back positive I postponed it. Now I like to plan for again since he has only barely responding to long tapering vancomycin course. I discussed the risks and benefits, he understands that pancreatitis is a potential risk. He does have ischemic cardiomyopathy and underwent anesthesia as assistance for this.

## 2011-09-20 NOTE — Patient Instructions (Addendum)
You will have labs checked today in the basement lab.  Please head down after you check out with the front desk  (cbc, cmet) You will be set up for an ERCP to remove the stones in your bile duct (++propofol at [facility]). Stay on long tapering vancomycin course. Start once daily Align probiotic. New prescription for lomotil written, take 4-5 pills a day.

## 2011-09-21 ENCOUNTER — Ambulatory Visit: Payer: 59 | Admitting: Cardiovascular Disease

## 2011-09-22 ENCOUNTER — Encounter (HOSPITAL_COMMUNITY): Payer: 59

## 2011-09-22 ENCOUNTER — Ambulatory Visit (INDEPENDENT_AMBULATORY_CARE_PROVIDER_SITE_OTHER): Payer: 59 | Admitting: Family Medicine

## 2011-09-22 VITALS — BP 120/54 | Temp 98.4°F | Wt 166.0 lb

## 2011-09-22 DIAGNOSIS — E875 Hyperkalemia: Secondary | ICD-10-CM

## 2011-09-22 LAB — BASIC METABOLIC PANEL
CO2: 20 mEq/L (ref 19–32)
Glucose, Bld: 158 mg/dL — ABNORMAL HIGH (ref 70–99)
Potassium: 5.5 mEq/L — ABNORMAL HIGH (ref 3.5–5.1)
Sodium: 142 mEq/L (ref 135–145)

## 2011-09-22 NOTE — Progress Notes (Addendum)
Subjective:    Patient ID: Kurt Richard, male    DOB: 1945/06/09, 66 y.o.   MRN: [id number]  HPI  Patient seen for further evaluation hyperkalemia. He had gradual increase in potassium from 5.6 to 5.9 to 6.3 2 days ago.  This in the setting of diarrhea and no potassium replacement. He does not take any ACE inhibitors or potassium sparing diuretics. He is not consuming excessive potassium in diet. He's had prior C. difficile and remains on vancomycin.   He had some recent peripheral edema and does have furosemide but not been taking this until recently. His previous labs have revealed normal sodium level. Magnesium level back in early July normal  Past Medical History  Diagnosis Date  . Diabetes mellitus   . Hypertension   . Chronic systolic heart failure     echo 04/12/11: Mild LVH, EF 30-35%, mid to distal anteroseptal and apical hypokinesis, grade 2 diastolic dysfunction, moderate LAE, mild RAE.  [name] Hyperlipidemia   . Tobacco abuse   . CAD (coronary artery disease)     acute anterior STEMI, late presentation 04/06/11 LHC 3/5 demonstrated severe ostial left main 95% stenosis and otherwise three-vessel CAD with an ejection fraction of 15%.  Emergent CABG: Dr. Dorris Fetch - Grafts: LIMA-LAD, SVG-D1, SVG-OM1, SVG-PDA and PL.  . Ischemic cardiomyopathy   . DM2 (diabetes mellitus, type 2)   . 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 Acute renal insufficiency     05/2011 admission (cr 1.54 at discharge)  . Anemia     Post-CABG  . Arthritis   . Stroke   . Sleep apnea   . Neuromuscular disorder     diabetic neuropathy  . CHF (congestive heart failure)    Past Surgical History  Procedure Date  . Coronary artery bypass graft 04/06/2011    Procedure: CORONARY ARTERY BYPASS GRAFTING (CABG);  Surgeon: Loreli Slot, MD;  Location: [facility] OR;  Service: Open Heart Surgery;  Laterality: N/A;  Coronary Artery Bypass Graft times four utilizing the left internal mammary artery and the Right  and left  greater Saphenous veins Harvested endoscopically.  . Fibroid bypass 04/06/11   . Colon surgery     rectal fissure  . Cervical disc surgery   . Colonoscopy 07/29/2011    Procedure: COLONOSCOPY;  Surgeon: Rachael Fee, MD;  Location: [facility];  Service: Endoscopy;  Laterality: N/A;    reports that he quit smoking about 5 months ago. His smoking use included Cigarettes. He smoked .5 packs per day. He has never used smokeless tobacco. He reports that he does not drink alcohol or use illicit drugs. family history includes Diabetes in his mother and sister and Heart attack in his mother and sister. Allergies  Allergen Reactions  . Diltiazem Hcl Hives and Swelling  . Nifedipine Hives and Swelling      Review of Systems  Constitutional: Positive for fatigue.  Respiratory: Negative for shortness of breath.   Cardiovascular: Positive for leg swelling. Negative for chest pain and palpitations.  Gastrointestinal: Positive for diarrhea. Negative for vomiting.  Neurological: Positive for weakness. Negative for dizziness, seizures, syncope and headaches.       Objective:   Physical Exam  Constitutional: He is oriented to person, place, and time. He appears well-developed and well-nourished.  Neck: Neck supple.  Cardiovascular: Normal rate and regular rhythm.   Pulmonary/Chest: Effort normal and breath sounds normal. No respiratory distress. He has no wheezes. He has no rales.  Musculoskeletal:  He exhibits edema.       1+ edema bilaterally  Neurological: He is alert and oriented to person, place, and time.          Assessment & Plan:  Hyperkalemia which is surprising given the fact his had persistent diarrhea. No ACE inhibitor use. No potassium sparing diuretics. Question renal etiology such as renal tubular acidosis. He has not had any recent low sodium to suggest likely aldosterone deficiency. Recent CBC reveals no evidence for leukocytosis or thrombocytosis. Check urine  spot for potassium, creatinine, and osmolality-will calculate fractional excretion of K. Check magnesium level. Repeat basic metabolic panel. Start back furosemide 40 mg once daily. Consult nephrology if renal etiology appears more apparent from labs above  FEK= 10.5%= ?likely extrarenal cause.  K was down some yesterday and patient on Lasix.  Repeat BMP in one week.

## 2011-09-22 NOTE — Patient Instructions (Addendum)
Go back on Furosemide 40 mg daily for the next week unless low blood pressure or otherwise unable to tolerate.

## 2011-09-23 ENCOUNTER — Ambulatory Visit (INDEPENDENT_AMBULATORY_CARE_PROVIDER_SITE_OTHER): Payer: 59 | Admitting: Cardiovascular Disease

## 2011-09-23 ENCOUNTER — Encounter: Payer: Self-pay | Admitting: Family Medicine

## 2011-09-23 ENCOUNTER — Other Ambulatory Visit (INDEPENDENT_AMBULATORY_CARE_PROVIDER_SITE_OTHER): Payer: 59

## 2011-09-23 ENCOUNTER — Encounter: Payer: Self-pay | Admitting: Cardiovascular Disease

## 2011-09-23 VITALS — BP 130/67 | HR 74 | Ht 66.0 in | Wt 163.1 lb

## 2011-09-23 DIAGNOSIS — I2510 Atherosclerotic heart disease of native coronary artery without angina pectoris: Secondary | ICD-10-CM

## 2011-09-23 DIAGNOSIS — R0989 Other specified symptoms and signs involving the circulatory and respiratory systems: Secondary | ICD-10-CM

## 2011-09-23 LAB — OSMOLALITY, URINE: Osmolality, Ur: 482 mOsm/kg (ref 390–1090)

## 2011-09-23 NOTE — Progress Notes (Signed)
Kurt Richard Date of Birth  07/06/1945 [facility]      Office  [address]    [address]   [address] [place], [place]  [address]    [place], [place]  [address] [phone]  Fax  [phone]  [phone]  Fax [phone]  Problem list: 1. Acute on chronic systolic CHF  2. Recent diarrhea, on Flagyl for presumed C diff  3. Acute renal insufficiency, d/c Cr 1.54  4. CAD   - recent late-presenting anterior MI 04/2011 with 3V dz s/p CABG  - postop course 04/2011 complicated by LV dysfunction requiring IABP/inotropes, ABL anemia, CVA  Secondary Discharge Diagnoses:  1. 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 Diabetes mellitus  3. Tobacco abuse  4. hypertension 5. hyperlipidemia 5. hyperlipidemia  History of Present Illness:  Kurt Richard is a 66 year old gentleman with the above-noted medical history. He status post recent coronary artery bypass grafting on March 5. He developed C. difficile colitis. He was admitted several weeks ago with volume overload. This occurred when we held his Lasix because of his profuse diarrhea. He is now been restarted on his medications and is feeling quite a bit better.  He still somewhat limited but is overall gradually regaining his strength.  His diet is gradually improving. He still not sleeping at night. He has had a postnasal drip since his surgery.  He continues to struggle with his C.Diff diarrhea. He is making slow progress.  He has had some problems with hyperkalemia.  He is having an ERCP with Dr. Christella Hartigan October 05, 2011.  Current Outpatient Prescriptions on File Prior to Visit  Medication Sig Dispense Refill  . aspirin 325 MG EC tablet Take 325 mg by mouth daily.      [name] BIDIL 20-37.5 MG per tablet TAKE 1 TABLET BY MOUTH TWICE A DAY  24 tablet  5  . Blood Glucose Monitoring Suppl (CONTOUR BLOOD GLUCOSE SYSTEM) DEVI Dispensed 09/13/11      . diphenoxylate-atropine (LOMOTIL) 2.5-0.025 MG per tablet Take 1 tablet by mouth 4  (four) times daily.  150 tablet  5  . fluticasone (FLONASE) 50 MCG/ACT nasal spray Place 2 sprays into the nose daily.  16 g  6  . glucose blood (BAYER CONTOUR TEST) test strip Use daily as instructed  100 each  3  . methimazole (TAPAZOLE) 5 MG tablet TAKE 1 TO 2 TABLETS BY MOUTH TWICE A DAY *NEED OFFICE VISIT*  30 tablet  0  . metoprolol succinate (TOPROL-XL) 50 MG 24 hr tablet Take 1 tablet (50 mg total) by mouth 2 (two) times daily.  60 tablet  5  . nitroGLYCERIN (NITROSTAT) 0.4 MG SL tablet Place 0.4 mg under the tongue every 5 (five) minutes as needed. For chest pain      . vancomycin (VANCOCIN HCL) 125 MG capsule 4 pills everyday for 2 weeks then 3 pills every day for 2 weeks then 2 pills everyday for 2 weeks the 1 pill every  day for 2 weeks then every other day for 2 weeks  150 capsule  0    Allergies  Allergen Reactions  . Diltiazem Hcl Hives and Swelling  . Nifedipine Hives and Swelling    Past Medical History  Diagnosis Date  . Diabetes mellitus   . Hypertension   . Chronic systolic heart failure     echo 04/12/11: Mild LVH, EF 30-35%, mid to distal anteroseptal and apical hypokinesis, grade 2 diastolic dysfunction, moderate LAE, mild RAE.  [name]  Hyperlipidemia   . Tobacco abuse   . CAD (coronary artery disease)     acute anterior STEMI, late presentation 04/06/11 LHC 3/5 demonstrated severe ostial left main 95% stenosis and otherwise three-vessel CAD with an ejection fraction of 15%.  Emergent CABG: Dr. Dorris Fetch - Grafts: LIMA-LAD, SVG-D1, SVG-OM1, SVG-PDA and PL.  . Ischemic cardiomyopathy   . DM2 (diabetes mellitus, type 2)   . 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 Acute renal insufficiency     05/2011 admission (cr 1.54 at discharge)  . Anemia     Post-CABG  . Arthritis   . Stroke   . Sleep apnea   . Neuromuscular disorder     diabetic neuropathy  . CHF (congestive heart failure)     Past Surgical History  Procedure Date  . Coronary artery bypass graft  04/06/2011    Procedure: CORONARY ARTERY BYPASS GRAFTING (CABG);  Surgeon: Loreli Slot, MD;  Location: [facility] OR;  Service: Open Heart Surgery;  Laterality: N/A;  Coronary Artery Bypass Graft times four utilizing the left internal mammary artery and the Right and left  greater Saphenous veins Harvested endoscopically.  . Fibroid bypass 04/06/11   . Colon surgery     rectal fissure  . Cervical disc surgery   . Colonoscopy 07/29/2011    Procedure: COLONOSCOPY;  Surgeon: Rachael Fee, MD;  Location: [facility];  Service: Endoscopy;  Laterality: N/A;    History  Smoking status  . Former Smoker -- 0.5 packs/day  . Types: Cigarettes  . Quit date: 04/06/2011  Smokeless tobacco  . Never Used    History  Alcohol Use No    Family History  Problem Relation Age of Onset  . Heart attack Mother     ?47s  . Heart attack Sister     69s  . Diabetes Mother   . Diabetes Sister     [place]    Reviw of Systems:  Reviewed in the HPI.  All other systems are negative.  Physical Exam: Blood pressure 130/67, pulse 74, height 5\' 6"  (1.676 m), weight 163 lb 1.9 oz (73.991 kg). General: Well developed, well nourished, in no acute distress.  He appears to be generally stronger than he was during his last several visits. Head: Normocephalic, atraumatic, sclera non-icteric, mucus membranes are moist,   Neck: Supple. Carotids are 2 + without bruits. No JVD  Lungs: Clear bilaterally to auscultation.  Heart: regular rate.  normal  S1 S2. No murmurs, gallops or rubs.    Abdomen: Soft, non-tender, non-distended with normal bowel sounds. No hepatomegaly. No rebound/guarding. No masses.  Msk:  Strength and tone are normal  Extremities: No clubbing or cyanosis. No edema.  Distal pedal pulses are 2+ and equal bilaterally.  Neuro: Alert and oriented X 3. Moves all extremities spontaneously.  Psych:  Responds to questions appropriately with a normal affect.  ECG:  Assessment / Plan:

## 2011-09-23 NOTE — Patient Instructions (Signed)
Your physician recommends that you schedule a follow-up appointment in: 3 months.  

## 2011-09-23 NOTE — Assessment & Plan Note (Signed)
[name] seems to be slowly improving.  We'll continue with his current medications.  He's had some issues with hyperkalemia. I reviewed his medications and I cannot find any medications that should cause him to be hyperkalemic. He insists that he is not eating a lot of fruits or vegetables in his diet.  I seen again in the office in several months for followup office visit.

## 2011-09-24 ENCOUNTER — Encounter (HOSPITAL_COMMUNITY): Payer: 59

## 2011-09-27 ENCOUNTER — Encounter (HOSPITAL_COMMUNITY): Payer: 59

## 2011-09-29 ENCOUNTER — Encounter (HOSPITAL_COMMUNITY): Payer: 59

## 2011-09-30 ENCOUNTER — Other Ambulatory Visit (INDEPENDENT_AMBULATORY_CARE_PROVIDER_SITE_OTHER): Payer: 59

## 2011-09-30 DIAGNOSIS — I10 Essential (primary) hypertension: Secondary | ICD-10-CM

## 2011-09-30 LAB — BASIC METABOLIC PANEL
BUN: 57 mg/dL — ABNORMAL HIGH (ref 6–23)
Chloride: 112 mEq/L (ref 96–112)
Glucose, Bld: 133 mg/dL — ABNORMAL HIGH (ref 70–99)
Potassium: 6.4 mEq/L (ref 3.5–5.1)
Sodium: 140 mEq/L (ref 135–145)

## 2011-10-01 ENCOUNTER — Encounter (HOSPITAL_COMMUNITY): Payer: 59

## 2011-10-01 NOTE — Progress Notes (Signed)
Quick Note:  Called and spoke with pt and pt states he was clecning his fist because he was given a sponge to squeeze prior to having labs drawn. Pls advise. ______

## 2011-10-05 ENCOUNTER — Encounter (HOSPITAL_COMMUNITY)
Admission: RE | Disposition: A | Discharge status: Home / Self Care | Payer: Self-pay | Source: Ambulatory Visit | Attending: Gastroenterology

## 2011-10-05 ENCOUNTER — Ambulatory Visit (HOSPITAL_COMMUNITY)
Admission: RE | Admit: 2011-10-05 | Discharge: 2011-10-05 | Disposition: A | Discharge status: Home / Self Care | Payer: 59 | Source: Ambulatory Visit | Attending: Gastroenterology | Admitting: Gastroenterology

## 2011-10-05 ENCOUNTER — Ambulatory Visit (HOSPITAL_COMMUNITY): Payer: 59 | Admitting: Anesthesiology

## 2011-10-05 ENCOUNTER — Encounter (HOSPITAL_COMMUNITY): Payer: Self-pay | Admitting: Anesthesiology

## 2011-10-05 ENCOUNTER — Ambulatory Visit (HOSPITAL_COMMUNITY): Payer: 59

## 2011-10-05 ENCOUNTER — Encounter (HOSPITAL_COMMUNITY): Payer: Self-pay | Admitting: Gastroenterology

## 2011-10-05 ENCOUNTER — Telehealth: Payer: Self-pay

## 2011-10-05 DIAGNOSIS — R197 Diarrhea, unspecified: Secondary | ICD-10-CM | POA: Insufficient documentation

## 2011-10-05 DIAGNOSIS — K8050 Calculus of bile duct without cholangitis or cholecystitis without obstruction: Secondary | ICD-10-CM | POA: Insufficient documentation

## 2011-10-05 DIAGNOSIS — I2510 Atherosclerotic heart disease of native coronary artery without angina pectoris: Secondary | ICD-10-CM | POA: Insufficient documentation

## 2011-10-05 DIAGNOSIS — K8020 Calculus of gallbladder without cholecystitis without obstruction: Secondary | ICD-10-CM

## 2011-10-05 DIAGNOSIS — I10 Essential (primary) hypertension: Secondary | ICD-10-CM | POA: Insufficient documentation

## 2011-10-05 DIAGNOSIS — E119 Type 2 diabetes mellitus without complications: Secondary | ICD-10-CM | POA: Insufficient documentation

## 2011-10-05 DIAGNOSIS — G4730 Sleep apnea, unspecified: Secondary | ICD-10-CM | POA: Insufficient documentation

## 2011-10-05 DIAGNOSIS — E785 Hyperlipidemia, unspecified: Secondary | ICD-10-CM | POA: Insufficient documentation

## 2011-10-05 DIAGNOSIS — Z79899 Other long term (current) drug therapy: Secondary | ICD-10-CM | POA: Insufficient documentation

## 2011-10-05 DIAGNOSIS — Z7982 Long term (current) use of aspirin: Secondary | ICD-10-CM | POA: Insufficient documentation

## 2011-10-05 DIAGNOSIS — Z951 Presence of aortocoronary bypass graft: Secondary | ICD-10-CM | POA: Insufficient documentation

## 2011-10-05 LAB — BASIC METABOLIC PANEL
BUN: 58 mg/dL — ABNORMAL HIGH (ref 6–23)
GFR calc Af Amer: 49 mL/min — ABNORMAL LOW (ref >90)
GFR calc non Af Amer: 42 mL/min — ABNORMAL LOW (ref >90)
Potassium: 6.1 mEq/L — ABNORMAL HIGH (ref 3.5–5.1)
Sodium: 138 mEq/L (ref 135–145)

## 2011-10-05 SURGERY — ENDOSCOPIC RETROGRADE CHOLANGIOPANCREATOGRAPHY (ERCP) WITH PROPOFOL
Anesthesia: Monitor Anesthesia Care

## 2011-10-05 MED ORDER — PROMETHAZINE HCL 25 MG/ML IJ SOLN: 6.2500 mg | INTRAMUSCULAR | Status: DC | PRN

## 2011-10-05 MED ORDER — FENTANYL CITRATE 0.05 MG/ML IJ SOLN
INTRAMUSCULAR | Status: DC | PRN
  Administered 2011-10-05: 25 ug via INTRAVENOUS

## 2011-10-05 MED ORDER — CIPROFLOXACIN IN D5W 400 MG/200ML IV SOLN
INTRAVENOUS | Status: AC
  Filled 2011-10-05: qty 200

## 2011-10-05 MED ORDER — MIDAZOLAM HCL 5 MG/5ML IJ SOLN
INTRAMUSCULAR | Status: DC | PRN
  Administered 2011-10-05: 2 mg via INTRAVENOUS

## 2011-10-05 MED ORDER — GLUCAGON HCL (RDNA) 1 MG IJ SOLR
INTRAMUSCULAR | Status: DC | PRN
  Administered 2011-10-05 (×2): .5 mg via INTRAVENOUS

## 2011-10-05 MED ORDER — GLUCAGON HCL (RDNA) 1 MG IJ SOLR
INTRAMUSCULAR | Status: AC
  Filled 2011-10-05: qty 2

## 2011-10-05 MED ORDER — PROPOFOL 10 MG/ML IV EMUL
INTRAVENOUS | Status: DC | PRN
  Administered 2011-10-05: 100 ug/kg/min via INTRAVENOUS

## 2011-10-05 MED ORDER — KETAMINE HCL 10 MG/ML IJ SOLN
INTRAMUSCULAR | Status: DC | PRN
  Administered 2011-10-05 (×5): 10 mg via INTRAVENOUS

## 2011-10-05 MED ORDER — PAREGORIC 2 MG/5ML PO TINC: 5.0000 mL | Freq: Two times a day (BID) | ORAL | Status: AC | PRN

## 2011-10-05 MED ORDER — SODIUM CHLORIDE 0.9 % IV SOLN: INTRAVENOUS | Status: DC

## 2011-10-05 MED ORDER — SODIUM CHLORIDE 0.9 % IV SOLN
INTRAVENOUS | Status: DC | PRN
  Administered 2011-10-05: 12:00:00 via INTRAVENOUS

## 2011-10-05 MED ORDER — EPHEDRINE SULFATE 50 MG/ML IJ SOLN
INTRAMUSCULAR | Status: DC | PRN
  Administered 2011-10-05: 5 mg via INTRAVENOUS

## 2011-10-05 MED ORDER — CIPROFLOXACIN IN D5W 400 MG/200ML IV SOLN
400.0000 mg | Freq: Once | INTRAVENOUS | Status: AC
  Administered 2011-10-05: 400 mg via INTRAVENOUS

## 2011-10-05 NOTE — Telephone Encounter (Signed)
Message copied by Donata Duff on Tue Oct 05, 2011  2:32 PM ------      Message from: Rachael Fee      Created: Tue Oct 05, 2011  2:25 PM       Just completed unsuccessful ERCP.            He needs out patient referral to Dr. Logan Bores at [facility] for CBD stones noted on recent CT scan.  Please send copy of today's ercp, recent EGD, colonoscopy, recent CT scan report, recent month of labs.  Thank you.

## 2011-10-05 NOTE — Anesthesia Preprocedure Evaluation (Addendum)
Anesthesia Evaluation  Patient identified by MRN, date of birth, ID band Patient awake    Reviewed: Allergy & Precautions, H&P , NPO status , Patient's Chart, lab work & pertinent test results  Airway Mallampati: II TM Distance: <3 FB Neck ROM: Full    Dental No notable dental hx.    Pulmonary sleep apnea ,  breath sounds clear to auscultation  Pulmonary exam normal       Cardiovascular hypertension, + CAD, + Past MI, + CABG and +CHF Rhythm:Regular Rate:Normal     Neuro/Psych negative neurological ROS  negative psych ROS   GI/Hepatic negative GI ROS, Neg liver ROS,   Endo/Other    Renal/GU Renal InsufficiencyRenal disease  negative genitourinary   Musculoskeletal negative musculoskeletal ROS (+)   Abdominal   Peds negative pediatric ROS (+)  Hematology negative hematology ROS (+)   Anesthesia Other Findings   Reproductive/Obstetrics negative OB ROS                           Anesthesia Physical Anesthesia Plan  ASA: III  Anesthesia Plan: MAC   Post-op Pain Management:    Induction: Intravenous  Airway Management Planned: Nasal Cannula  Additional Equipment:   Intra-op Plan:   Post-operative Plan:   Informed Consent: I have reviewed the patients History and Physical, chart, labs and discussed the procedure including the risks, benefits and alternatives for the proposed anesthesia with the patient or authorized representative who has indicated his/her understanding and acceptance.   Dental advisory given  Plan Discussed with: CRNA and Surgeon  Anesthesia Plan Comments:         Anesthesia Quick Evaluation

## 2011-10-05 NOTE — Anesthesia Postprocedure Evaluation (Signed)
  Anesthesia Post-op Note  Patient: Kurt Richard  Procedure(s) Performed: Procedure(s) (LRB): ENDOSCOPIC RETROGRADE CHOLANGIOPANCREATOGRAPHY (ERCP) WITH PROPOFOL (N/A)  Patient Location: PACU  Anesthesia Type: MAC  Level of Consciousness: awake and alert   Airway and Oxygen Therapy: Patient Spontanous Breathing  Post-op Pain: mild  Post-op Assessment: Post-op Vital signs reviewed, Patient's Cardiovascular Status Stable, Respiratory Function Stable, Patent Airway and No signs of Nausea or vomiting  Post-op Vital Signs: stable  Complications: No apparent anesthesia complications

## 2011-10-05 NOTE — Interval H&P Note (Signed)
History and Physical Interval Note:  10/05/2011 12:17 PM  Kurt Richard  has presented today for surgery, with the diagnosis of Gall stones, common bile duct [574.50]  The various methods of treatment have been discussed with the patient and family. After consideration of risks, benefits and other options for treatment, the patient has consented to  Procedure(s) (LRB) with comments: ENDOSCOPIC RETROGRADE CHOLANGIOPANCREATOGRAPHY (ERCP) WITH PROPOFOL (N/A) as a surgical intervention .  The patient's history has been reviewed, patient examined, no change in status, stable for surgery.  I have reviewed the patient's chart and labs.  Questions were answered to the patient's satisfaction.     Rob Bunting

## 2011-10-05 NOTE — H&P (View-Only) (Signed)
Review of pertinent gastrointestinal problems:  1. C. Diff positive by PCR June 2013. This was despite 2 courses of oral Flagyl and one course of vancomycin. Was put on long tapering course of vancomycin previously positive for Clostridium difficile however since then multiple checks have been negative. Colonoscopy 2013 June with a look in the terminal ileum 1-2 months ago did not show Clostridium difficile clearly, the cecum was slightly irregular but biopsies showed no obvious significant pathology.  Sep 02, 2011 c diff PCR again POSITIVE, started long tapering vacn course again.  July 2013 celiac sprue testing negative serologically 2. Multiple CBD stones, incidentally(?) noted on CT done for diarrhea workup.  ERCP scheduled for early August however C. Diff again positive and so ERCP postponed.  HPI: This is a very pleasant 66-year-old man whom I saw about one to 2 months ago. He has difficult to control diarrhea. He has been Clostridium difficile by PCR positive twice, however colonoscopy in between those was fairly unrevealing see above. He did not really respond to vancomycin or fidomicin and I am not really sure  if Clostridium difficile is really a problem here.   Can have good days (1-2 in a row at most), then bad primarily nocturnal diarrhea.  Nighttime meal is primary meal.      Past Medical History  Diagnosis Date  . Diabetes mellitus   . Hypertension   . Chronic systolic heart failure     echo 04/12/11: Mild LVH, EF 30-35%, mid to distal anteroseptal and apical hypokinesis, grade 2 diastolic dysfunction, moderate LAE, mild RAE.  . Hyperlipidemia   . Tobacco abuse   . CAD (coronary artery disease)     acute anterior STEMI, late presentation 04/06/11 LHC 3/5 demonstrated severe ostial left main 95% stenosis and otherwise three-vessel CAD with an ejection fraction of 15%.  Emergent CABG: Dr. Hendrickson - Grafts: LIMA-LAD, SVG-D1, SVG-OM1, SVG-PDA and PL.  . Ischemic cardiomyopathy   .  DM2 (diabetes mellitus, type 2)   . 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 Acute renal insufficiency     05/2011 admission (cr 1.54 at discharge)  . Anemia     Post-CABG  . Arthritis   . Stroke   . Sleep apnea   . Neuromuscular disorder     diabetic neuropathy  . CHF (congestive heart failure)     Past Surgical History  Procedure Date  . Coronary artery bypass graft 04/06/2011    Procedure: CORONARY ARTERY BYPASS GRAFTING (CABG);  Surgeon: Steven C Hendrickson, MD;  Location: [facility] OR;  Service: Open Heart Surgery;  Laterality: N/A;  Coronary Artery Bypass Graft times four utilizing the left internal mammary artery and the Right and left  greater Saphenous veins Harvested endoscopically.  . Fibroid bypass 04/06/11   . Colon surgery     rectal fissure  . Cervical disc surgery   . Colonoscopy 07/29/2011    Procedure: COLONOSCOPY;  Surgeon: Daniel P Jacobs, MD;  Location: [facility];  Service: Endoscopy;  Laterality: N/A;    Current Outpatient Prescriptions  Medication Sig Dispense Refill  . aspirin 325 MG EC tablet Take 325 mg by mouth daily.      . BIDIL 20-37.5 MG per tablet TAKE 1 TABLET BY MOUTH TWICE A DAY  24 tablet  5  . Blood Glucose Monitoring Suppl (CONTOUR BLOOD GLUCOSE SYSTEM) DEVI Dispensed 09/13/11      . diphenoxylate-atropine (LOMOTIL) 2.5-0.025 MG per tablet Take 1 tablet by mouth 2 (two) times   daily.  90 tablet  11  . fluticasone (FLONASE) 50 MCG/ACT nasal spray Place 2 sprays into the nose daily.  16 g  6  . glucose blood (BAYER CONTOUR TEST) test strip Use daily as instructed  100 each  3  . methimazole (TAPAZOLE) 5 MG tablet Take 1/2 tab by mouth twice daily  Pt will need return OV before any more refills.  Please inform pt and thank you.      . metoprolol succinate (TOPROL-XL) 50 MG 24 hr tablet Take 1 tablet (50 mg total) by mouth 2 (two) times daily.  60 tablet  5  . nitroGLYCERIN (NITROSTAT) 0.4 MG SL tablet Place 0.4 mg under the tongue every 5 (five)  minutes as needed. For chest pain      . vancomycin (VANCOCIN HCL) 125 MG capsule 4 pills everyday for 2 weeks then 3 pills every day for 2 weeks then 2 pills everyday for 2 weeks the 1 pill every  day for 2 weeks then every other day for 2 weeks  150 capsule  0    Allergies as of 09/20/2011 - Review Complete 09/20/2011  Allergen Reaction Noted  . Diltiazem hcl Hives and Swelling 04/06/2011  . Nifedipine Hives and Swelling 04/06/2011    Family History  Problem Relation Age of Onset  . Heart attack Mother     ?50s  . Heart attack Sister     40s  . Diabetes Mother   . Diabetes Sister     [place]    History   Social History  . Marital Status: Single    Spouse Name: N/A    Number of Children: 0  . Years of Education: N/A   Occupational History  . Not on file.   Social History Main Topics  . Smoking status: Former Smoker -- 0.5 packs/day    Types: Cigarettes    Quit date: 04/06/2011  . Smokeless tobacco: Never Used  . Alcohol Use: No  . Drug Use: No  . Sexually Active: No   Other Topics Concern  . Not on file   Social History Narrative  . No narrative on file      Physical Exam: BP 126/60  Pulse 80  Ht 5' 6.25" (1.683 m)  Wt 163 lb 4 oz (74.05 kg)  BMI 26.15 kg/m2 Constitutional: generally well-appearing Psychiatric: alert and oriented x3 Abdomen: soft, nontender, nondistended, no obvious ascites, no peritoneal signs, normal bowel sounds     Assessment and plan: 66 y.o. male with chronic diarrhea  I am not really sure if clostridium difficile is causing his problem here. He did not respond to the usual antibiotics, colonoscopy was fairly underwhelming. He was C. difficile PCR positive twice over that is a very sensitive test and perhaps these are false positives. Celiac sprue testing has been negative. Other stool testing was negative. On a CT scan which I did his common bile duct was found to be filled with gallstones. Perhaps this is driving some of his  diarrhea much in the way that cholecystectomy can cause diarrhea. I was planning ERCP 2-3 weeks ago but when the second Clostridium difficile PCR came back positive I postponed it. Now I like to plan for again since he has only barely responding to long tapering vancomycin course. I discussed the risks and benefits, he understands that pancreatitis is a potential risk. He does have ischemic cardiomyopathy and underwent anesthesia as assistance for this.  

## 2011-10-05 NOTE — Preoperative (Signed)
Beta Blockers   Reason not to administer Beta Blockers:Will monitor and give beta blocker as necessary intraop.

## 2011-10-05 NOTE — Progress Notes (Signed)
Quick Note:  Gwen and Nana aware ______

## 2011-10-05 NOTE — Telephone Encounter (Signed)
Need to call [facility]

## 2011-10-05 NOTE — Op Note (Signed)
[facility] [address] [place], [address]   ERCP PROCEDURE REPORT  PATIENT: Kurt Richard, Kurt Richard.  MR# :[id number] BIRTHDATE: 11-12-1945  GENDER: Male ENDOSCOPIST: Rachael Fee, MD PROCEDURE DATE:  10/05/2011 PROCEDURE:   ERCP, diagnostic ASA CLASS:   Class IV INDICATIONS:cbd stones on CT scan, chronic diarrhea. MEDICATIONS: MAC sedation, administered by CRNA TOPICAL ANESTHETIC: Cetacaine Spray  DESCRIPTION OF PROCEDURE:   After the risks benefits and alternatives of the procedure were thoroughly explained, informed consent was obtained.  The Pentax ERCP [id number]  endoscope was introduced through the mouth  and advanced to the second portion of the duodenum .  The ampulla was located in the second portion of the duodenum and it was normal appearing.  A 44 Autotome over a 0.035 hydrawire was used to attempt biliary cannulation. An identical wire was temporarily placed into the main pancreatic duct to facilitate biliary cannulation. The main PD was never injected with dye.  Multiple attempts to cannulat the bile duct were not successful.  The scope was then completely withdrawn from the patient and the procedure terminated.     COMPLICATIONS: .  There were no complications.  ENDOSCOPIC IMPRESSION: Unable to cannulate bile duct. This was not a successful ERCP  RECOMMENDATIONS: My office will set up referal to [facility] Dr. Othelia Pulling in his office to consider ERCP there.  His chronic diarrhea is somewhat improved but still bothersome.   _______________________________ eSignedRachael Fee, MD 10/05/2011 2:23 PM

## 2011-10-05 NOTE — Transfer of Care (Signed)
Immediate Anesthesia Transfer of Care Note  Patient: Kurt Richard  Procedure(s) Performed: Procedure(s) (LRB) with comments: ENDOSCOPIC RETROGRADE CHOLANGIOPANCREATOGRAPHY (ERCP) WITH PROPOFOL (N/A)  Patient Location: PACU  Anesthesia Type: MAC  Level of Consciousness: awake, alert , oriented and patient cooperative  Airway & Oxygen Therapy: Patient Spontanous Breathing and Patient connected to nasal cannula oxygen  Post-op Assessment: Report given to PACU RN, Post -op Vital signs reviewed and stable and Patient moving all extremities X 4  Post vital signs: Reviewed and stable  Complications: No apparent anesthesia complications

## 2011-10-06 ENCOUNTER — Encounter (HOSPITAL_COMMUNITY): Payer: 59

## 2011-10-06 NOTE — Telephone Encounter (Signed)
Pt has given appt for 12/17/11 9 am with Dr Logan Bores at [facility] will send info to the pt Left message on machine to call back   Dr Christella Hartigan is this soon enough?  If its not they request a call from the physician to request a sooner appt.  Number is [phone].

## 2011-10-06 NOTE — Telephone Encounter (Signed)
Pt has been given [employer]

## 2011-10-08 ENCOUNTER — Telehealth: Payer: Self-pay | Admitting: Gastroenterology

## 2011-10-08 ENCOUNTER — Encounter (HOSPITAL_COMMUNITY): Payer: 59

## 2011-10-08 DIAGNOSIS — R197 Diarrhea, unspecified: Secondary | ICD-10-CM

## 2011-10-08 NOTE — Telephone Encounter (Signed)
Pt aware and will come buy and pick up stool kits

## 2011-10-08 NOTE — Telephone Encounter (Signed)
Can complete the vancomycin.  Should stay on the peregoric, increase dose if needed.  Also should stay on lomotil several pills a day.  Would like repeat c. Diff by pcr checked again.

## 2011-10-08 NOTE — Telephone Encounter (Signed)
Pt was given a prescription for diarrhea at his procedure, his diarrhea has been worse.  Pt had 4 diarrhea episodes last night.  Pt is down to 2 pills daily of Vancomycin.  Please advise

## 2011-10-11 ENCOUNTER — Encounter (HOSPITAL_COMMUNITY): Payer: 59

## 2011-10-12 ENCOUNTER — Other Ambulatory Visit: Payer: 59

## 2011-10-12 DIAGNOSIS — R197 Diarrhea, unspecified: Secondary | ICD-10-CM

## 2011-10-13 ENCOUNTER — Encounter (HOSPITAL_COMMUNITY): Payer: 59

## 2011-10-15 ENCOUNTER — Encounter (HOSPITAL_COMMUNITY): Payer: 59

## 2011-10-17 ENCOUNTER — Other Ambulatory Visit: Payer: Self-pay | Admitting: Family Medicine

## 2011-11-04 ENCOUNTER — Other Ambulatory Visit: Payer: Self-pay | Admitting: Cardiovascular Disease

## 2011-11-04 ENCOUNTER — Telehealth: Payer: Self-pay | Admitting: Gastroenterology

## 2011-11-04 MED ORDER — OXYCODONE HCL 5 MG PO TABS: 5.0000 mg | ORAL_TABLET | ORAL | Status: AC | PRN

## 2011-11-04 NOTE — Telephone Encounter (Signed)
Pt has been having severe pain on the left side and continues to have diarrhea with fecal incontinence at night.  He finished the Vanc.  Has appt 12/17/11 at [facility].  Pt would like a refill on oxycodone that was given to him at the hospital.

## 2011-11-04 NOTE — Telephone Encounter (Signed)
Yes,  Oxycodone  Refill is ok.  Not sure the dose he was on before, can you check

## 2011-11-04 NOTE — Telephone Encounter (Signed)
Pt aware and will pick up rx tomorrow Dr Christella Hartigan rx is on your desk to sign

## 2011-11-05 NOTE — Telephone Encounter (Signed)
Fax Received. Refill Completed. Kurt Richard (R.M.A)   

## 2011-12-14 ENCOUNTER — Ambulatory Visit (INDEPENDENT_AMBULATORY_CARE_PROVIDER_SITE_OTHER): Payer: 59 | Admitting: Family Medicine

## 2011-12-14 ENCOUNTER — Encounter: Payer: Self-pay | Admitting: Family Medicine

## 2011-12-14 VITALS — BP 130/80 | Temp 98.1°F | Wt 175.0 lb

## 2011-12-14 DIAGNOSIS — Z23 Encounter for immunization: Secondary | ICD-10-CM

## 2011-12-14 DIAGNOSIS — E875 Hyperkalemia: Secondary | ICD-10-CM

## 2011-12-14 DIAGNOSIS — E119 Type 2 diabetes mellitus without complications: Secondary | ICD-10-CM

## 2011-12-14 DIAGNOSIS — I10 Essential (primary) hypertension: Secondary | ICD-10-CM

## 2011-12-14 LAB — BASIC METABOLIC PANEL
BUN: 47 mg/dL — ABNORMAL HIGH (ref 6–23)
CO2: 20 mEq/L (ref 19–32)
Chloride: 109 mEq/L (ref 96–112)
Creatinine, Ser: 1.8 mg/dL — ABNORMAL HIGH (ref 0.4–1.5)
Glucose, Bld: 140 mg/dL — ABNORMAL HIGH (ref 70–99)

## 2011-12-14 NOTE — Progress Notes (Signed)
Subjective:     Patient ID: Kurt Richard, male   DOB: Apr 07, 1945, 66 y.o.   MRN: [id number]  HPI 66 year old male with complicated medical history here for 76-month follow-up.  Medical issues include CAD s/p 5-vessel CABG, diabetes, hypertension, choledocholithiasis, and ongoing C. Diff diarrhea.   He is followed by Dr. Christella Hartigan for GI, and recently underwent an unsuccessful ERCP for removal of gallstones.  Dr. Christella Hartigan referred him to [facility], where he has an appt on Friday of this week.  Chronic diarrhea ongoing for past 9 months.  At last visit here, noted to have hyperkalemia 6.4, repeated a few days later 6.1.  Unusual in setting of chronic diarrhea, no ACEi or ARB, and not on K+ replacement.    Checks blood sugar intermittently.  Reports readings all less than 140.  Most recent A1C 6.6.  Some paresthesias in toes, but unchanged from last visit.  Recent TSH and T4 in Aug 2013 showed thyroid still overactive and methimazole was increased to 5mg  BID.  Past Medical History  Diagnosis Date  . Diabetes mellitus   . Hypertension   . Chronic systolic heart failure     echo 04/12/11: Mild LVH, EF 30-35%, mid to distal anteroseptal and apical hypokinesis, grade 2 diastolic dysfunction, moderate LAE, mild RAE.  [name] Hyperlipidemia   . Tobacco abuse   . CAD (coronary artery disease)     acute anterior STEMI, late presentation 04/06/11 LHC 3/5 demonstrated severe ostial left main 95% stenosis and otherwise three-vessel CAD with an ejection fraction of 15%.  Emergent CABG: Dr. Dorris Fetch - Grafts: LIMA-LAD, SVG-D1, SVG-OM1, SVG-PDA and PL.  . Ischemic cardiomyopathy   . DM2 (diabetes mellitus, type 2)   . 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 Acute renal insufficiency     05/2011 admission (cr 1.54 at discharge)  . Anemia     Post-CABG  . Arthritis   . Stroke   . Sleep apnea   . Neuromuscular disorder     diabetic neuropathy  . CHF (congestive heart failure)    Past Surgical History    Procedure Date  . Coronary artery bypass graft 04/06/2011    Procedure: CORONARY ARTERY BYPASS GRAFTING (CABG);  Surgeon: Loreli Slot, MD;  Location: [facility] OR;  Service: Open Heart Surgery;  Laterality: N/A;  Coronary Artery Bypass Graft times four utilizing the left internal mammary artery and the Right and left  greater Saphenous veins Harvested endoscopically.  . Fibroid bypass 04/06/11   . Colon surgery     rectal fissure  . Cervical disc surgery   . Colonoscopy 07/29/2011    Procedure: COLONOSCOPY;  Surgeon: Rachael Fee, MD;  Location: [facility];  Service: Endoscopy;  Laterality: N/A;    reports that he quit smoking about 8 months ago. His smoking use included Cigarettes. He smoked .5 packs per day. He has never used smokeless tobacco. He reports that he does not drink alcohol or use illicit drugs. family history includes Diabetes in his mother and sister and Heart attack in his mother and sister. Allergies  Allergen Reactions  . Diltiazem Hcl Hives and Swelling  . Nifedipine Hives and Swelling  '  Review of Systems  Constitutional: Positive for appetite change (decreased).  Respiratory: Negative for shortness of breath.   Cardiovascular: Negative for chest pain.  Gastrointestinal: Positive for diarrhea (chronic for past 9 months. ). Negative for abdominal pain and blood in stool.  Neurological: Positive for numbness (in toes and on  tops of feet.).       Objective:   Physical Exam  Constitutional: He is oriented to person, place, and time. He appears well-developed and well-nourished.       Ambulates with aid of a cane.  Cardiovascular: Normal rate, regular rhythm and normal heart sounds.   Pulmonary/Chest: Effort normal and breath sounds normal. No respiratory distress.  Musculoskeletal: Normal range of motion.       LE strength 5/5 bilaterally.  Neurological: He is alert and oriented to person, place, and time.       Decreased sensation across tops of toes, R  worse than L, and along lateral soles of both feet.  Skin: Skin is warm and dry. No erythema.       No wounds or signs of infection in feet.       Assessment:     66 year old with complicated medical history here for 32-month follow-up.    Plan:     1. Hyperkalemia: recent FEK suggests extrarenal cuase.  Pt still on Lasix.  Re-check BMP today, making sure not to clench fist during specimen collection.  If still elevated, will refer to nephrology for evaluation. 2. Diabetes: unmedicated and currently fairly well-controlled.  Encourage daily BS checks.  Recheck A1C today. 3. GI issues: still persistent diarrhea and gallstones unable to be removed by ERCP.  Recent C diff test negative after 8 wks on oral vanc.  Defer to Dr. Christella Hartigan and [facility] for management. 4. Hyperthyroidism: recently bumped methimazole to 5mg  PO BID.  Recheck TSH and T4 today. 5. Follow-up in 3 months.    Agree with assessment and plan as per Marthann Schiller, MS 3 Nephrology referral if K still high. Evelena Peat MD

## 2011-12-17 ENCOUNTER — Telehealth: Payer: Self-pay | Admitting: Gastroenterology

## 2011-12-17 NOTE — Telephone Encounter (Signed)
Received 6 pages from Dr. Harolyn Rutherford @ [facility] and sent them up to 3rd Floor for Dr. Christella Hartigan to review.  asw

## 2011-12-20 ENCOUNTER — Other Ambulatory Visit: Payer: Self-pay | Admitting: *Deleted

## 2011-12-20 ENCOUNTER — Encounter: Payer: Self-pay | Admitting: Gastroenterology

## 2011-12-20 ENCOUNTER — Telehealth: Payer: Self-pay | Admitting: Gastroenterology

## 2011-12-20 MED ORDER — METOPROLOL SUCCINATE ER 50 MG PO TB24: 50.0000 mg | ORAL_TABLET | Freq: Two times a day (BID) | ORAL | Status: DC

## 2011-12-20 NOTE — Telephone Encounter (Signed)
Pt was concerned about a disc he needs to send to [facility] I placed a call to [facility] and she is having the disc prepared and the will be called by Okey Dupre to discuss

## 2011-12-20 NOTE — Telephone Encounter (Signed)
error 

## 2011-12-20 NOTE — Telephone Encounter (Signed)
Left message on machine to call back  

## 2011-12-20 NOTE — Telephone Encounter (Signed)
Fax Received. Refill Completed. Nanci Lakatos Chowoe (R.M.A)   

## 2011-12-27 ENCOUNTER — Encounter: Payer: Self-pay | Admitting: Cardiovascular Disease

## 2011-12-27 ENCOUNTER — Ambulatory Visit (INDEPENDENT_AMBULATORY_CARE_PROVIDER_SITE_OTHER): Payer: 59 | Admitting: Cardiovascular Disease

## 2011-12-27 ENCOUNTER — Encounter: Payer: Self-pay | Admitting: *Deleted

## 2011-12-27 VITALS — BP 130/69 | HR 83 | Ht 68.0 in | Wt 167.0 lb

## 2011-12-27 DIAGNOSIS — I5022 Chronic systolic (congestive) heart failure: Secondary | ICD-10-CM

## 2011-12-27 NOTE — Progress Notes (Signed)
Kurt Richard Date of Birth  07/13/1945 [facility]     [place] [facility]  [address]    [address]   [address] [place], [place]  [address]    [place], [place]  [address] [phone]  Fax  [phone]  [phone]  Fax [phone]  Problem list: 1. Acute on chronic systolic CHF  2. C. Diff colitis. 3. Acute renal insufficiency, d/c Cr 1.54  4. CAD - recent late-presenting anterior MI 04/2011 with 3V dz s/p CABG   - postop course 04/2011 complicated by LV dysfunction requiring IABP/inotropes, ABL anemia, CVA  5.  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 Diabetes mellitus  7. Tobacco abuse  8. hypertension 9. hyperlipidemia  History of Present Illness:  Kurt Richard is a 66 year old gentleman with the above-noted medical history.  He is still having problems with his abdomen and the diarrhea.  The last stool sample was negative for c. Diff. He needs to have another ERCP at [facility]. He has done well from a cardiac standpoint.  No further exacerbations of CHf.  No angina.  Current Outpatient Prescriptions on File Prior to Visit  Medication Sig Dispense Refill  . aspirin 325 MG EC tablet Take 325 mg by mouth daily.      [name] BIDIL 20-37.5 MG per tablet TAKE 1 TABLET BY MOUTH TWICE A DAY  24 tablet  5  . Blood Glucose Monitoring Suppl (CONTOUR BLOOD GLUCOSE SYSTEM) DEVI Dispensed 09/13/11      . diphenoxylate-atropine (LOMOTIL) 2.5-0.025 MG per tablet Take 1 tablet by mouth 4 (four) times daily as needed.      . fluticasone (FLONASE) 50 MCG/ACT nasal spray Place 2 sprays into the nose as needed.      . furosemide (LASIX) 40 MG tablet Take 40 mg by mouth daily.      [name] glucose blood (BAYER CONTOUR TEST) test strip Use daily as instructed  100 each  3  . methimazole (TAPAZOLE) 5 MG tablet TAKE 1 TO 2 TABLETS BY MOUTH TWICE A DAY *NEED OFFICE VISIT*  30 tablet  5  . metoprolol succinate (TOPROL-XL) 50 MG 24 hr tablet Take 1 tablet (50 mg total) by mouth 2 (two) times daily.  60 tablet  5  .  nitroGLYCERIN (NITROSTAT) 0.4 MG SL tablet Place 0.4 mg under the tongue every 5 (five) minutes as needed. For chest pain      . oxyCODONE (OXY IR/ROXICODONE) 5 MG immediate release tablet Take 5 mg by mouth as needed. Per Dr. Georgann Richard      . paregoric 2 MG/5ML solution Take 5 mLs by mouth daily as needed. Per Dr. Christella Richard        Allergies  Allergen Reactions  . Diltiazem Hcl Hives and Swelling  . Nifedipine Hives and Swelling    Past Medical History  Diagnosis Date  . Diabetes mellitus   . Hypertension   . Chronic systolic heart failure     echo 04/12/11: Mild LVH, EF 30-35%, mid to distal anteroseptal and apical hypokinesis, grade 2 diastolic dysfunction, moderate LAE, mild RAE.  [name] Hyperlipidemia   . Tobacco abuse   . CAD (coronary artery disease)     acute anterior STEMI, late presentation 04/06/11 LHC 3/5 demonstrated severe ostial left main 95% stenosis and otherwise three-vessel CAD with an ejection fraction of 15%.  Emergent CABG: Kurt Richard - Grafts: LIMA-LAD, SVG-D1, SVG-OM1, SVG-PDA and PL.  . Ischemic cardiomyopathy   . DM2 (diabetes mellitus, type 2)   . Cardioembolic  stroke     post bypass 04/2011  . Acute renal insufficiency     05/2011 admission (cr 1.54 at discharge)  . Anemia     Post-CABG  . Arthritis   . Stroke   . Sleep apnea   . Neuromuscular disorder     diabetic neuropathy  . CHF (congestive heart failure)     Past Surgical History  Procedure Date  . Coronary artery bypass graft 04/06/2011    Procedure: CORONARY ARTERY BYPASS GRAFTING (CABG);  Surgeon: Kurt Slot, MD;  Location: [facility] OR;  Service: Open Heart Surgery;  Laterality: N/A;  Coronary Artery Bypass Graft times four utilizing the left internal mammary artery and the Right and left  greater Saphenous veins Harvested endoscopically.  . Fibroid bypass 04/06/11   . Colon surgery     rectal fissure  . Cervical disc surgery   . Colonoscopy 07/29/2011    Procedure: COLONOSCOPY;  Surgeon: Kurt Fee, MD;  Location: [facility];  Service: Endoscopy;  Laterality: N/A;    History  Smoking status  . Former Smoker -- 0.5 packs/day  . Types: Cigarettes  . Quit date: 04/06/2011  Smokeless tobacco  . Never Used    History  Alcohol Use No    Family History  Problem Relation Age of Onset  . Heart attack Mother     ?43s  . Heart attack Sister     32s  . Diabetes Mother   . Diabetes Sister     Kurt Richard    Reviw of Systems:  Reviewed in the HPI.  All other systems are negative.  Physical Exam: Blood pressure 130/69, pulse 83, height 5\' 8"  (1.727 m), weight 167 lb (75.751 kg). General: Well developed, well nourished, in no acute distress.  He appears to be generally stronger than he was during his last several visits. Head: Normocephalic, atraumatic, sclera non-icteric, mucus membranes are moist,   Neck: Supple. Carotids are 2 + without bruits. No JVD  Lungs: Clear bilaterally to auscultation.  Heart: regular rate.  normal  S1 S2. No murmurs, gallops or rubs.    Abdomen: Soft, non-tender, non-distended with normal bowel sounds. No hepatomegaly. No rebound/guarding. No masses.  Msk:  Strength and tone are normal  Extremities: No clubbing or cyanosis. No edema.  Distal pedal pulses are 2+ and equal bilaterally.  Neuro: Alert and oriented X 3. Moves all extremities spontaneously.  Psych:  Responds to questions appropriately with a normal affect.  ECG:  Assessment / Plan:

## 2011-12-27 NOTE — Assessment & Plan Note (Signed)
Kurt Richard is doing well from a cardiac standpoint.  He is at low risk for CV complications during his ERCP.  Continue current meds.  Return in 6 months for OV and fasting labs.

## 2011-12-27 NOTE — Patient Instructions (Addendum)
Your physician wants you to follow-up in: 6 months  You will receive a reminder letter in the mail two months in advance. If you don't receive a letter, please call our office to schedule the follow-up appointment.  Your physician recommends that you return for a FASTING lipid profile: 6 months   You are at low risk for your your upcoming procedure.

## 2012-01-04 ENCOUNTER — Telehealth: Payer: Self-pay | Admitting: Gastroenterology

## 2012-01-04 NOTE — Telephone Encounter (Signed)
Received 5 pages from [facility], sent to Dr. Christella Hartigan. 01/04/12/sd.

## 2012-01-10 ENCOUNTER — Other Ambulatory Visit: Payer: Self-pay | Admitting: *Deleted

## 2012-01-10 NOTE — Telephone Encounter (Signed)
Opened in Error.

## 2012-01-13 ENCOUNTER — Other Ambulatory Visit: Payer: Self-pay | Admitting: *Deleted

## 2012-01-13 NOTE — Telephone Encounter (Signed)
Opened in Error.

## 2012-02-02 ENCOUNTER — Other Ambulatory Visit: Payer: Self-pay | Admitting: Family Medicine

## 2012-02-03 ENCOUNTER — Other Ambulatory Visit: Payer: Self-pay | Admitting: *Deleted

## 2012-02-03 MED ORDER — ISOSORB DINITRATE-HYDRALAZINE 20-37.5 MG PO TABS: 1.0000 | ORAL_TABLET | Freq: Two times a day (BID) | ORAL | Status: DC

## 2012-02-03 NOTE — Telephone Encounter (Signed)
Fax Received. Refill Completed. Stacie Templin Chowoe (R.M.A)   

## 2012-02-09 ENCOUNTER — Other Ambulatory Visit: Payer: Self-pay | Admitting: *Deleted

## 2012-02-09 NOTE — Telephone Encounter (Signed)
Opened in Error.

## 2012-02-10 ENCOUNTER — Other Ambulatory Visit: Payer: Self-pay | Admitting: *Deleted

## 2012-02-10 NOTE — Telephone Encounter (Signed)
Opened in Error.

## 2012-02-15 ENCOUNTER — Other Ambulatory Visit: Payer: 59

## 2012-02-15 ENCOUNTER — Other Ambulatory Visit: Payer: Self-pay | Admitting: Family Medicine

## 2012-02-15 ENCOUNTER — Ambulatory Visit (INDEPENDENT_AMBULATORY_CARE_PROVIDER_SITE_OTHER): Payer: 59 | Admitting: Gastroenterology

## 2012-02-15 ENCOUNTER — Encounter: Payer: Self-pay | Admitting: Gastroenterology

## 2012-02-15 VITALS — BP 126/50 | HR 84 | Ht 66.25 in | Wt 180.2 lb

## 2012-02-15 DIAGNOSIS — K8050 Calculus of bile duct without cholangitis or cholecystitis without obstruction: Secondary | ICD-10-CM

## 2012-02-15 DIAGNOSIS — R197 Diarrhea, unspecified: Secondary | ICD-10-CM

## 2012-02-15 NOTE — Progress Notes (Signed)
Review of pertinent gastrointestinal problems:  1. C. Diff positive by PCR June 2013. This was despite 2 courses of oral Flagyl and one course of vancomycin. Was put on long tapering course of vancomycin previously positive for Clostridium difficile however since then multiple checks have been negative. Colonoscopy 2013 June with a look in the terminal ileum 1-2 months ago did not show Clostridium difficile clearly, the cecum was slightly irregular but biopsies showed no obvious significant pathology. Sep 02, 2011 c diff PCR again POSITIVE, started long tapering vacn course again. July 2013 celiac sprue testing negative serologically  2. Multiple CBD stones, incidentally(?) noted on CT done for diarrhea workup. ERCP scheduled for early August however C. Diff again positive and so ERCP postponed.  Sept 2013 ERCP here was unsuccesful, unable to cannulate CBD.  Sent to [facility] for ERCP. 01/2012 ERCP, biliary sphincterotomy and removal of multiple common bile duct stones at [facility].   HPI: This is a   very pleasant 67 year old man whom I last saw several months ago.  He is up 17 pounds in past 4 months.   diarrhrea is just as bad as ever, maybe worse.  His partner did most of the talking.  Has urgency after eating often.  4-5 loose, urgent stools daily.    ERCP one month ago at [facility], I reviewed the report and it shows that his common bile duct was cleared of stones.   Had left sided abd pain after ERCP.  Last week an episode of right sided pain and urgency.  He takes paragoric 2-3 times a day.  Usually 10mL per dose.      Past Medical History  Diagnosis Date  . Diabetes mellitus   . Hypertension   . Chronic systolic heart failure     echo 04/12/11: Mild LVH, EF 30-35%, mid to distal anteroseptal and apical hypokinesis, grade 2 diastolic dysfunction, moderate LAE, mild RAE.  [name] Hyperlipidemia   . Tobacco abuse   . CAD (coronary artery disease)     acute anterior STEMI, late  presentation 04/06/11 LHC 3/5 demonstrated severe ostial left main 95% stenosis and otherwise three-vessel CAD with an ejection fraction of 15%.  Emergent CABG: Dr. Dorris Fetch - Grafts: LIMA-LAD, SVG-D1, SVG-OM1, SVG-PDA and PL.  . Ischemic cardiomyopathy   . DM2 (diabetes mellitus, type 2)   . 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 Acute renal insufficiency     05/2011 admission (cr 1.54 at discharge)  . Anemia     Post-CABG  . Arthritis   . Stroke   . Sleep apnea   . Neuromuscular disorder     diabetic neuropathy  . CHF (congestive heart failure)     Past Surgical History  Procedure Date  . Coronary artery bypass graft 04/06/2011    Procedure: CORONARY ARTERY BYPASS GRAFTING (CABG);  Surgeon: Loreli Slot, MD;  Location: [facility] OR;  Service: Open Heart Surgery;  Laterality: N/A;  Coronary Artery Bypass Graft times four utilizing the left internal mammary artery and the Right and left  greater Saphenous veins Harvested endoscopically.  . Fibroid bypass 04/06/11   . Colon surgery     rectal fissure  . Cervical disc surgery   . Colonoscopy 07/29/2011    Procedure: COLONOSCOPY;  Surgeon: Rachael Fee, MD;  Location: [facility];  Service: Endoscopy;  Laterality: N/A;    Current Outpatient Prescriptions  Medication Sig Dispense Refill  . aspirin 325 MG EC tablet Take 325 mg by mouth  daily.      . Blood Glucose Monitoring Suppl (CONTOUR BLOOD GLUCOSE SYSTEM) DEVI Dispensed 09/13/11      . diphenoxylate-atropine (LOMOTIL) 2.5-0.025 MG per tablet Take 1 tablet by mouth 4 (four) times daily as needed.      . fluticasone (FLONASE) 50 MCG/ACT nasal spray Place 2 sprays into the nose as needed.      . furosemide (LASIX) 40 MG tablet Take 40 mg by mouth daily.      [name] glucose blood (BAYER CONTOUR TEST) test strip Use daily as instructed  100 each  3  . isosorbide-hydrALAZINE (BIDIL) 20-37.5 MG per tablet Take 1 tablet by mouth 2 (two) times daily.  24 tablet  5  . methimazole  (TAPAZOLE) 5 MG tablet TAKE 1 TO 2 TABLETS BY MOUTH TWICE A DAY *NEED OFFICE VISIT*  30 tablet  5  . metoprolol succinate (TOPROL-XL) 50 MG 24 hr tablet Take 1 tablet (50 mg total) by mouth 2 (two) times daily.  60 tablet  5  . nitroGLYCERIN (NITROSTAT) 0.4 MG SL tablet Place 0.4 mg under the tongue every 5 (five) minutes as needed. For chest pain      . oxyCODONE (OXY IR/ROXICODONE) 5 MG immediate release tablet Take 5 mg by mouth as needed. Per Dr. Georgann Housekeeper      . paregoric 2 MG/5ML solution Take 5 mLs by mouth daily as needed. Per Dr. Christella Hartigan        Allergies as of 02/15/2012 - Review Complete 02/15/2012  Allergen Reaction Noted  . Diltiazem hcl Hives and Swelling 04/06/2011  . Nifedipine Hives and Swelling 04/06/2011    Family History  Problem Relation Age of Onset  . Heart attack Mother     ?42s  . Heart attack Sister     24s  . Diabetes Mother   . Diabetes Sister     [place]    History   Social History  . Marital Status: Single    Spouse Name: N/A    Number of Children: 0  . Years of Education: N/A   Occupational History  . Not on file.   Social History Main Topics  . Smoking status: Former Smoker -- 0.5 packs/day    Types: Cigarettes    Quit date: 04/06/2011  . Smokeless tobacco: Never Used  . Alcohol Use: No  . Drug Use: No  . Sexually Active: No   Other Topics Concern  . Not on file   Social History Narrative  . No narrative on file      Physical Exam: BP 126/50  Pulse 84  Ht 5' 6.25" (1.683 m)  Wt 180 lb 4 oz (81.761 kg)  BMI 28.87 kg/m2 Constitutional: Chronically ill-appearing Psychiatric: alert and oriented x3 Abdomen: soft, nontender, nondistended, no obvious ascites, no peritoneal signs, normal bowel sounds     Assessment and plan: 67 y.o. male with chronic diarrhea, gallstone disease  First he is put on up 17 pounds since I saw him last and looks clearly more vibrant. He continues to have problems with watery diarrhea despite paregoric  and Lomotil. He has had a very exhaustive workup see above. He is homosexual and has not had an HIV test in some time and he agreed to have another one now. I'm also sending a stool for new GI pathogen panel. He will begin taking Imodium one pill twice a day as well. He has clear gallstone disease and underwent ERCP last month. I'm going to set him up to see a  general surgeon to consider elective laparoscopic cholecystectomy.

## 2012-02-15 NOTE — Patient Instructions (Addendum)
You will have labs checked today in the basement lab.  Please head down after you check out with the front desk  (GI pathogen panel, HIV test). Start one imodium twice daily (in addition to the lomotil and paragoric). Referral to general surgeon to consider lap chole given your history of CBD stones.

## 2012-02-16 ENCOUNTER — Telehealth: Payer: Self-pay

## 2012-02-16 NOTE — Telephone Encounter (Signed)
Message copied by Donata Duff on Wed Feb 16, 2012  8:45 AM ------      Message from: Marnette Burgess      Created: Wed Feb 16, 2012  8:31 AM       Patient scheduled to see Dr. Claud Kelp on 02/21/12 @ 5:00pm, arrive @ 4:30pm.  If you have any questions please call [phone].            Thank You,      Elane Fritz      ----- Message -----         From: Donata Duff, CMA         Sent: 02/15/2012   3:18 PM           To: Marnette Burgess            Pt needs appt to discuss gallbladder surgery

## 2012-02-16 NOTE — Telephone Encounter (Signed)
blanca to call pt

## 2012-02-17 ENCOUNTER — Other Ambulatory Visit: Payer: Self-pay | Admitting: *Deleted

## 2012-02-17 NOTE — Telephone Encounter (Signed)
Opened in Error.

## 2012-02-21 ENCOUNTER — Ambulatory Visit (INDEPENDENT_AMBULATORY_CARE_PROVIDER_SITE_OTHER): Payer: 59 | Admitting: General Surgery

## 2012-02-21 ENCOUNTER — Encounter (INDEPENDENT_AMBULATORY_CARE_PROVIDER_SITE_OTHER): Payer: Self-pay

## 2012-02-21 ENCOUNTER — Encounter (INDEPENDENT_AMBULATORY_CARE_PROVIDER_SITE_OTHER): Payer: Self-pay | Admitting: General Surgery

## 2012-02-21 ENCOUNTER — Telehealth (INDEPENDENT_AMBULATORY_CARE_PROVIDER_SITE_OTHER): Payer: Self-pay

## 2012-02-21 VITALS — BP 144/70 | HR 76 | Temp 97.9°F | Resp 18 | Ht 66.5 in | Wt 183.5 lb

## 2012-02-21 DIAGNOSIS — K8050 Calculus of bile duct without cholangitis or cholecystitis without obstruction: Secondary | ICD-10-CM

## 2012-02-21 NOTE — Progress Notes (Addendum)
Patient ID: Kurt Richard, male   DOB: 03/25/1945, 67 y.o.   MRN: [id number]  Chief Complaint  Patient presents with  . Abdominal Pain    HPI Kurt Richard is a 67 y.o. male.  He is referred by Dr. Evelena Peat and Dr. Wendall Papa for consideration of elective cholecystectomy.  This patient had profound problems with recurrent C. Difficile colitis this past year. He had to go under numerous prolonged courses of antibiotics. This finally seems to be resolving and he has gained some of his weight back. He was still having some diarrhea but Imodium seems to be improving that.  During his workup in June of 2013 he had a colonoscopy which was grossly normal. A CT scan of the abdomen which showed a dilated common bile duct and common bile duct stones as an incidental finding. When he had recovered from his colitis ERCP was attempted but was unable to be completed because of technical difficulties. On 01/03/2012 he underwent ERCP and  successful sphincterotomy and removal of common bile duct stones by Dr. Othelia Pulling at [facility]. He did well with that. He had office visit with Dr. Wendall Papa on 02/15/2012 at which time he recommended elective cholecystectomy.  The patient does not appear to be very symptomatic from his gallbladder. He does have some vague intermittent upper abdominal pain. This is hard to sort out given his  problems with C. Difficile colitis  Past history significant for coronary artery disease, myocardial infarction and emergent CABG in March. He had a cerebro vascular accident in the perioperative period and has a little bit of speech problem and a little but of left lower extremity weakness. He has ischemic cardiomyopathy. He has  systolic congestive heart failure with an EF of 30-35%. Has ongoing tobacco abuse. Hypertension. Diabetes mellitus. Sleep apnea.  He is on aspirin, BIDIL, Flonase, Lasix, Tapazole, metoprolol, nitroglycerin sublingual,  oxycodone, paregoric. HPI  Past Medical History  Diagnosis Date  . Diabetes mellitus   . Hypertension   . Chronic systolic heart failure     echo 04/12/11: Mild LVH, EF 30-35%, mid to distal anteroseptal and apical hypokinesis, grade 2 diastolic dysfunction, moderate LAE, mild RAE.  [name] Hyperlipidemia   . Tobacco abuse   . CAD (coronary artery disease)     acute anterior STEMI, late presentation 04/06/11 LHC 3/5 demonstrated severe ostial left main 95% stenosis and otherwise three-vessel CAD with an ejection fraction of 15%.  Emergent CABG: Dr. Dorris Fetch - Grafts: LIMA-LAD, SVG-D1, SVG-OM1, SVG-PDA and PL.  . Ischemic cardiomyopathy   . DM2 (diabetes mellitus, type 2)   . 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 Acute renal insufficiency     05/2011 admission (cr 1.54 at discharge)  . Anemia     Post-CABG  . Arthritis   . Stroke   . Sleep apnea   . Neuromuscular disorder     diabetic neuropathy  . CHF (congestive heart failure)   . Heart attack   . Diarrhea   . Weakness   . Bruises easily     Past Surgical History  Procedure Date  . Coronary artery bypass graft 04/06/2011    Procedure: CORONARY ARTERY BYPASS GRAFTING (CABG);  Surgeon: Loreli Slot, MD;  Location: [facility] OR;  Service: Open Heart Surgery;  Laterality: N/A;  Coronary Artery Bypass Graft times four utilizing the left internal mammary artery and the Right and left  greater Saphenous veins Harvested endoscopically.  [name]  Fibroid bypass 04/06/11   . Colon surgery     rectal fissure  . Cervical disc surgery 1997 - approximate  . Colonoscopy 07/29/2011    Procedure: COLONOSCOPY;  Surgeon: Rachael Fee, MD;  Location: [facility];  Service: Endoscopy;  Laterality: N/A;  . Eye surgery 2011    cataract removal    Family History  Problem Relation Age of Onset  . Heart attack Mother     ?63s  . Diabetes Mother   . Heart attack Sister     76s  . Diabetes Sister     Dietitian  . Congestive Heart Failure Father      Social History History  Substance Use Topics  . Smoking status: Former Smoker -- 0.5 packs/day    Types: Cigarettes    Quit date: 04/06/2011  . Smokeless tobacco: Never Used  . Alcohol Use: No    Allergies  Allergen Reactions  . Diltiazem Hcl Hives and Swelling  . Nifedipine Hives and Swelling    Current Outpatient Prescriptions  Medication Sig Dispense Refill  . loperamide (IMODIUM A-D) 2 MG tablet Take 2 mg by mouth 2 (two) times daily as needed.      [name] aspirin 325 MG EC tablet Take 325 mg by mouth daily.      . Blood Glucose Monitoring Suppl [facility] BLOOD GLUCOSE SYSTEM) DEVI Dispensed 09/13/11      . diphenoxylate-atropine (LOMOTIL) 2.5-0.025 MG per tablet Take 1 tablet by mouth 4 (four) times daily as needed.      . fluticasone (FLONASE) 50 MCG/ACT nasal spray Place 2 sprays into the nose as needed.      . furosemide (LASIX) 40 MG tablet Take 40 mg by mouth daily.      [name] glucose blood (BAYER CONTOUR TEST) test strip Use daily as instructed  100 each  3  . isosorbide-hydrALAZINE (BIDIL) 20-37.5 MG per tablet Take 1 tablet by mouth 2 (two) times daily.  24 tablet  5  . methimazole (TAPAZOLE) 5 MG tablet TAKE 1 TO 2 TABLETS BY MOUTH TWICE A DAY *NEED OFFICE VISIT*  30 tablet  5  . methimazole (TAPAZOLE) 5 MG tablet Take 1 tablet (5 mg total) by mouth daily.  30 tablet  5  . metoprolol succinate (TOPROL-XL) 50 MG 24 hr tablet Take 1 tablet (50 mg total) by mouth 2 (two) times daily.  60 tablet  5  . nitroGLYCERIN (NITROSTAT) 0.4 MG SL tablet Place 0.4 mg under the tongue every 5 (five) minutes as needed. For chest pain      . oxyCODONE (OXY IR/ROXICODONE) 5 MG immediate release tablet Take 5 mg by mouth as needed. Per Dr. Georgann Housekeeper      . paregoric 2 MG/5ML solution Take 5 mLs by mouth daily as needed. Per Dr. Christella Hartigan        Review of Systems Review of Systems  Constitutional: Positive for activity change. Negative for fever, chills and unexpected weight change.  HENT:  Negative for hearing loss, congestion, sore throat, trouble swallowing and voice change.   Eyes: Negative for visual disturbance.  Respiratory: Negative for cough, chest tightness and wheezing.   Cardiovascular: Negative for chest pain, palpitations and leg swelling.  Gastrointestinal: Positive for abdominal pain and diarrhea. Negative for nausea, vomiting, constipation, blood in stool, abdominal distention, anal bleeding and rectal pain.  Genitourinary: Negative for hematuria and difficulty urinating.  Musculoskeletal: Negative for arthralgias.  Skin: Negative for rash and wound.  Neurological: Positive for speech difficulty and numbness.  Negative for seizures, syncope, weakness and headaches.       Mild left lower extremity weakness. Mild speech disorder.  Hematological: Negative for adenopathy. Does not bruise/bleed easily.  Psychiatric/Behavioral: Negative for confusion.    Blood pressure 144/70, pulse 76, temperature 97.9 F (36.6 C), temperature source Temporal, resp. rate 18, height 5' 6.5" (1.689 m), weight 183 lb 8 oz (83.235 kg).  Physical Exam Physical Exam  Constitutional: He is oriented to person, place, and time. He appears well-developed and well-nourished. No distress.       Ambulates with a cane.  HENT:  Head: Normocephalic.  Nose: Nose normal.  Mouth/Throat: No oropharyngeal exudate.  Eyes: Conjunctivae normal and EOM are normal. Pupils are equal, round, and reactive to light. Right eye exhibits no discharge. Left eye exhibits no discharge. No scleral icterus.  Neck: Normal range of motion. Neck supple. No JVD present. No tracheal deviation present. No thyromegaly present.  Cardiovascular: Normal rate, regular rhythm, normal heart sounds and intact distal pulses.   No murmur heard. Pulmonary/Chest: Effort normal and breath sounds normal. No stridor. No respiratory distress. He has no wheezes. He has no rales. He exhibits no tenderness.  Abdominal: Soft. Bowel sounds  are normal. He exhibits no distension and no mass. There is no tenderness. There is no rebound and no guarding.       No scars. No hernias.  Musculoskeletal: Normal range of motion. He exhibits no edema and no tenderness.  Lymphadenopathy:    He has no cervical adenopathy.  Neurological: He is alert and oriented to person, place, and time. He has normal reflexes. Coordination normal.       Decreased sensation across the tops of his toes. Right worse than left.  Skin: Skin is warm and dry. No rash noted. He is not diaphoretic. No erythema. No pallor.  Psychiatric: He has a normal mood and affect. His behavior is normal. Judgment and thought content normal.      Data Reviewed Numerous office notes from Dr. Christella Hartigan, Dr. Caryl Never, and Dr. Elease Hashimoto. ERCP report from Dr. Logan Bores at [facility] records.   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  C. Difficile colitis, recurrent, improved  History ERCP with sphincterotomy and common duct stone removal at [facility] 01/03/2012. No apparent complications  Coronary artery disease, history of myocardial infarction and emergent CABG March 2013  Ischemic cardiomyopathy  Systolic congestive heart failure ejection fraction 30-35%.Stable.  Perioperative cerebrovascular accident with sequelae  Hypertension  Diabetes mellitus  Tobacco abuse  Sleep apnea    Plan    The patient was offered elective laparoscopic cholecystectomy, possible open. After lengthy discussion with him and his partner he would like this done  We will ask Dr. Kristeen Miss for cardiac clearance and approval to stop his aspirin a few days preop  Addendum: 03/03/2012. Dr. Leodis Sias saw Kurt Richard and stated that he was at moderate risk from a cardiac standpoint for upcoming colon laparoscopic cholecystectomy. He felt it was okay to proceed without further cardiac testing. Last echo was in March and showed ejection  fraction of 30-35%.  He will be scheduled for laparoscopic cholecystectomy with cholangiogram, possible open, at [facility] hospital  I discussed the indications, details, techniques, and numerous risks of the surgery with him. He seems to understand all these issues. All of his questions were answered. He agrees with this plan.       Angelia Mould. Derrell Lolling, M.D., [facility] General and Minimally  invasive Surgery Breast and Colorectal Surgery Office:   [phone] Pager:   [phone]  02/21/2012, 5:59 PM

## 2012-02-21 NOTE — Telephone Encounter (Signed)
Pre-Op cardiac Clearance faxed to Dr. Elease Hashimoto @ [phone].  Fax confirmation rec'd and attached to outgoing fax.

## 2012-02-21 NOTE — Patient Instructions (Signed)
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 The gallbladder is almost certainly the source of your stones.  You will be scheduled for laparoscopic cholecystectomy with cholangiogram, possible open at [facility] hospital in the future.  We will ask Dr. Kristeen Miss to give cardiac clearance for this surgery and we will ask him if it is okay to stop your aspirin 5 days preop.      Laparoscopic Cholecystectomy Laparoscopic cholecystectomy is surgery to remove the gallbladder. The gallbladder is located slightly to the right of center in the abdomen, behind the liver. It is a concentrating and storage sac for the bile produced in the liver. Bile aids in the digestion and absorption of fats. Gallbladder disease (cholecystitis) is an inflammation of your gallbladder. This condition is usually caused by a buildup of gallstones (cholelithiasis) in your gallbladder. Gallstones can block the flow of bile, resulting in inflammation and pain. In severe cases, emergency surgery may be required. When emergency surgery is not required, you will have time to prepare for the procedure. Laparoscopic surgery is an alternative to open surgery. Laparoscopic surgery usually has a shorter recovery time. Your common bile duct may also need to be examined and explored. Your caregiver will discuss this with you if he or she feels this should be done. If stones are found in the common bile duct, they may be removed. LET YOUR CAREGIVER KNOW ABOUT:  Allergies to food or medicine.  Medicines taken, including vitamins, herbs, eyedrops, over-the-counter medicines, and creams.  Use of steroids (by mouth or creams).  Previous problems with anesthetics or numbing medicines.  History of bleeding problems or blood clots.  Previous surgery.  Other health problems, including diabetes and kidney  problems.  Possibility of pregnancy, if this applies. RISKS AND COMPLICATIONS All surgery is associated with risks. Some problems that may occur following this procedure include:  Infection.  Damage to the common bile duct, nerves, arteries, veins, or other internal organs such as the stomach or intestines.  Bleeding.  A stone may remain in the common bile duct. BEFORE THE PROCEDURE  Do not take aspirin for 3 days prior to surgery or blood thinners for 1 week prior to surgery.  Do not eat or drink anything after midnight the night before surgery.  Let your caregiver know if you develop a cold or other infectious problem prior to surgery.  You should be present 60 minutes before the procedure or as directed. PROCEDURE  You will be given medicine that makes you sleep (general anesthetic). When you are asleep, your surgeon will make several small cuts (incisions) in your abdomen. One of these incisions is used to insert a small, lighted scope (laparoscope) into the abdomen. The laparoscope helps the surgeon see into your abdomen. Carbon dioxide gas will be pumped into your abdomen. The gas allows more room for the surgeon to perform your surgery. Other operating instruments are inserted through the other incisions. Laparoscopic procedures may not be appropriate when:  There is major scarring from previous surgery.  The gallbladder is extremely inflamed.  There are bleeding disorders or unexpected cirrhosis of the liver.  A pregnancy is near term.  Other conditions make the laparoscopic procedure impossible. If your surgeon feels it is not safe to continue with a laparoscopic procedure, he or she will perform an open abdominal procedure. In this case, the surgeon will make an incision to open the abdomen.  This gives the surgeon a larger view and field to work within. This may allow the surgeon to perform procedures that sometimes cannot be performed with a laparoscope alone. Open  surgery has a longer recovery time. AFTER THE PROCEDURE  You will be taken to the recovery area where a nurse will watch and check your progress.  You may be allowed to go home the same day.  Do not resume physical activities until directed by your caregiver.  You may resume a normal diet and activities as directed. Document Released: 01/18/2005 Document Revised: 04/12/2011 Document Reviewed: 07/03/2010 [employer] [facility] Patient Information 2013 [place], [place].

## 2012-02-22 ENCOUNTER — Encounter (INDEPENDENT_AMBULATORY_CARE_PROVIDER_SITE_OTHER): Payer: Self-pay

## 2012-02-29 ENCOUNTER — Telehealth (INDEPENDENT_AMBULATORY_CARE_PROVIDER_SITE_OTHER): Payer: Self-pay | Admitting: General Surgery

## 2012-02-29 NOTE — Telephone Encounter (Signed)
Called and left message for patient with Kurt Richard (authorized contact in demographics) and advised of appointment for him to be evaluated for cardiac clearance. Patient to be seen at cardiologist office on Friday 03/03/12 at 10:00. I advised that once we receive the clearance that he will be contacted by the surgery schedulers to be notified of surgery.

## 2012-03-02 ENCOUNTER — Encounter: Payer: Self-pay | Admitting: *Deleted

## 2012-03-03 ENCOUNTER — Encounter: Payer: Self-pay | Admitting: Nurse Practitioner

## 2012-03-03 ENCOUNTER — Encounter (INDEPENDENT_AMBULATORY_CARE_PROVIDER_SITE_OTHER): Payer: Self-pay | Admitting: General Surgery

## 2012-03-03 ENCOUNTER — Ambulatory Visit (INDEPENDENT_AMBULATORY_CARE_PROVIDER_SITE_OTHER): Payer: 59 | Admitting: Nurse Practitioner

## 2012-03-03 VITALS — BP 152/64 | HR 70 | Ht 66.5 in | Wt 179.4 lb

## 2012-03-03 DIAGNOSIS — I519 Heart disease, unspecified: Secondary | ICD-10-CM

## 2012-03-03 DIAGNOSIS — Z01810 Encounter for preprocedural cardiovascular examination: Secondary | ICD-10-CM

## 2012-03-03 LAB — CBC WITH DIFFERENTIAL/PLATELET
Basophils Absolute: 0.1 10*3/uL (ref 0.0–0.1)
Basophils Relative: 0.5 % (ref 0.0–3.0)
Eosinophils Absolute: 0.4 10*3/uL (ref 0.0–0.7)
Eosinophils Relative: 3.8 % (ref 0.0–5.0)
HCT: 27.8 % — ABNORMAL LOW (ref 39.0–52.0)
Hemoglobin: 9.3 g/dL — ABNORMAL LOW (ref 13.0–17.0)
Lymphocytes Relative: 16 % (ref 12.0–46.0)
Lymphs Abs: 1.5 10*3/uL (ref 0.7–4.0)
MCHC: 33.5 g/dL (ref 30.0–36.0)
MCV: 89.9 fl (ref 78.0–100.0)
Monocytes Absolute: 0.6 10*3/uL (ref 0.1–1.0)
Monocytes Relative: 6.7 % (ref 3.0–12.0)
Neutro Abs: 6.9 10*3/uL (ref 1.4–7.7)
Neutrophils Relative %: 73 % (ref 43.0–77.0)
Platelets: 299 10*3/uL (ref 150.0–400.0)
RBC: 3.09 Mil/uL — ABNORMAL LOW (ref 4.22–5.81)
RDW: 15.8 % — ABNORMAL HIGH (ref 11.5–14.6)
WBC: 9.5 10*3/uL (ref 4.5–10.5)

## 2012-03-03 LAB — BASIC METABOLIC PANEL
BUN: 77 mg/dL — ABNORMAL HIGH (ref 6–23)
CO2: 20 mEq/L (ref 19–32)
Calcium: 8.9 mg/dL (ref 8.4–10.5)
Chloride: 108 mEq/L (ref 96–112)
Creatinine, Ser: 2.8 mg/dL — ABNORMAL HIGH (ref 0.4–1.5)
GFR: 23.9 mL/min — ABNORMAL LOW (ref >60.00)
Glucose, Bld: 130 mg/dL — ABNORMAL HIGH (ref 70–99)
Potassium: 5.7 mEq/L — ABNORMAL HIGH (ref 3.5–5.1)
Sodium: 137 mEq/L (ref 135–145)

## 2012-03-03 MED ORDER — ISOSORB DINITRATE-HYDRALAZINE 20-37.5 MG PO TABS: 1.0000 | ORAL_TABLET | Freq: Two times a day (BID) | ORAL | Status: DC

## 2012-03-03 NOTE — Progress Notes (Signed)
Kurt Richard Date of Birth: Aug 28, 1945 Medical Record #[id number]  History of Present Illness: [name] is seen back today for a work in visit. He is seen for Dr. Elease Hashimoto. He has chronic systolic CHF. Has known CAD with late presenting anterior MI back in March of last year and with subsequent CABG. His post op course was complicated by LV dysfunction, ABL anema and a stroke.  His other issues include DM, tobacco abuse, HTN and HLD.   He has been found to have gallstones and has seen General Surgery. 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 He had to go under numerous prolonged courses of antibiotics. This finally seems to be resolving. He was still having some diarrhea but Imodium seems to be improving that. During his workup in June of 2013 he had a colonoscopy which was grossly normal. A CT scan of the abdomen which showed a dilated common bile duct and common bile duct stones as an incidental finding. When he had recovered from his colitis ERCP was attempted but was unable to be completed because of technical difficulties. On 01/03/2012 he underwent ERCP and successful sphincterotomy and removal of common bile duct stones by Dr. Othelia Pulling at [facility]. He did well with that. He had office visit with Dr. Wendall Papa on 02/15/2012 at which time he recommended elective cholecystectomy.  He comes in today. He is here with his partner. He was last here in November. Remains pretty sedentary. His day consists of sitting at the computer and/or watching TV all day. He remains limited by diarrhea. Was not able to do cardiac rehab. Has no actual belly complaints except the chronic diarrhea. No chest pain. Not short of breath. He can't walk. Would not be able to walk around the block. BP at home is a little better. Has not had follow up echo in almost one year. No apparent CHF symptoms reported.   Current Outpatient Prescriptions on File  Prior to Visit  Medication Sig Dispense Refill  . aspirin 325 MG EC tablet Take 325 mg by mouth daily.      . Blood Glucose Monitoring Suppl [facility] BLOOD GLUCOSE SYSTEM) DEVI Dispensed 09/13/11      . diphenoxylate-atropine (LOMOTIL) 2.5-0.025 MG per tablet Take 1 tablet by mouth 4 (four) times daily as needed.      . fluticasone (FLONASE) 50 MCG/ACT nasal spray Place 2 sprays into the nose as needed.      . furosemide (LASIX) 40 MG tablet Take 40 mg by mouth daily.      [name] glucose blood (BAYER CONTOUR TEST) test strip Use daily as instructed  100 each  3  . isosorbide-hydrALAZINE (BIDIL) 20-37.5 MG per tablet Take 1 tablet by mouth 2 (two) times daily.  24 tablet  5  . loperamide (IMODIUM A-D) 2 MG tablet Take 2 mg by mouth 2 (two) times daily as needed.      . methimazole (TAPAZOLE) 5 MG tablet Take 1 tablet (5 mg total) by mouth daily.  30 tablet  5  . metoprolol succinate (TOPROL-XL) 50 MG 24 hr tablet Take 1 tablet (50 mg total) by mouth 2 (two) times daily.  60 tablet  5  . nitroGLYCERIN (NITROSTAT) 0.4 MG SL tablet Place 0.4 mg under the tongue every 5 (five) minutes as needed. For chest pain      . oxyCODONE (OXY IR/ROXICODONE) 5 MG immediate release tablet Take 5 mg by mouth as  needed. Per Dr. Georgann Housekeeper      . paregoric 2 MG/5ML solution Take 5 mLs by mouth daily as needed. Per Dr. Christella Hartigan        Allergies  Allergen Reactions  . Diltiazem Hcl Hives and Swelling  . Nifedipine Hives and Swelling    Past Medical History  Diagnosis Date  . Diabetes mellitus   . Hypertension   . Chronic systolic heart failure     echo 04/12/11: Mild LVH, EF 30-35%, mid to distal anteroseptal and apical hypokinesis, grade 2 diastolic dysfunction, moderate LAE, mild RAE.  [name] Hyperlipidemia   . Tobacco abuse   . CAD (coronary artery disease)     acute anterior STEMI, late presentation 04/06/11 LHC 3/5 demonstrated severe ostial left main 95% stenosis and otherwise three-vessel CAD with an ejection fraction of  15%.  Emergent CABG: Dr. Dorris Fetch - Grafts: LIMA-LAD, SVG-D1, SVG-OM1, SVG-PDA and PL.  . Ischemic cardiomyopathy   . DM2 (diabetes mellitus, type 2)   . 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 Acute renal insufficiency     05/2011 admission (cr 1.54 at discharge)  . Anemia     Post-CABG  . Arthritis   . Stroke   . Sleep apnea   . Neuromuscular disorder     diabetic neuropathy  . Diarrhea     has had c diff diarrhea  . Weakness   . Bruises easily     Past Surgical History  Procedure Date  . Coronary artery bypass graft 04/06/2011    Procedure: CORONARY ARTERY BYPASS GRAFTING (CABG);  Surgeon: Loreli Slot, MD;  Location: [facility] OR;  Service: Open Heart Surgery;  Laterality: N/A;  Coronary Artery Bypass Graft times four utilizing the left internal mammary artery and the Right and left  greater Saphenous veins Harvested endoscopically.  . Fibroid bypass 04/06/11   . Colon surgery     rectal fissure  . Cervical disc surgery 1997 - approximate  . Colonoscopy 07/29/2011    Procedure: COLONOSCOPY;  Surgeon: Rachael Fee, MD;  Location: [facility];  Service: Endoscopy;  Laterality: N/A;  . Eye surgery 2011    cataract removal    History  Smoking status  . Former Smoker -- 0.5 packs/day  . Types: Cigarettes  . Quit date: 04/06/2011  Smokeless tobacco  . Never Used    History  Alcohol Use No    Family History  Problem Relation Age of Onset  . Heart attack Mother     ?72s  . Diabetes Mother   . Heart attack Sister     66s  . Diabetes Sister     Dietitian  . Congestive Heart Failure Father     Review of Systems: The review of systems is per the HPI.  All other systems were reviewed and are negative.  Physical Exam: BP 152/64  Pulse 70  Ht 5' 6.5" (1.689 m)  Wt 179 lb 6.4 oz (81.375 kg)  BMI 28.52 kg/m2 Patient is very pleasant and in no acute distress. His color is pale and he looks chronically ill. Skin is warm and dry. Color is normal.  HEENT is  unremarkable. Normocephalic/atraumatic. PERRL. Sclera are nonicteric. Neck is supple. No masses. No JVD. Lungs are clear. Cardiac exam shows a regular rate and rhythm. Abdomen is soft. Extremities are without edema. Gait and ROM are intact. No gross neurologic deficits noted.  LABORATORY DATA: EKG shows sinus rhythm. Old anterior MI. Lateral T wave changes. Looks unchanged.  Echo Study Conclusions from March 2013  - Left ventricle: The cavity size was normal. Wall thickness was increased in a pattern of mild LVH. Systolic function was moderately to severely reduced. The estimated ejection fraction was in the range of 30% to 35%. There is akinesis of the mid-distalanteroseptal and apical myocardium. Features are consistent with a pseudonormal left ventricular filling pattern, with concomitant abnormal relaxation and increased filling pressure (grade 2 diastolic dysfunction). - Mitral valve: Calcified annulus. Mildly thickened leaflets . - Left atrium: The atrium was moderately dilated. - Right atrium: The atrium was mildly dilated. - Pericardium, extracardiac: A trivial pericardial effusion   Lab Results  Component Value Date   WBC 8.0 09/20/2011   HGB 9.3* 09/20/2011   HCT 27.6* 09/20/2011   PLT 275.0 09/20/2011   GLUCOSE 140* 12/14/2011   CHOL 79 08/06/2011   TRIG 97 08/06/2011   HDL 27* 08/06/2011   LDLCALC 33 08/06/2011   ALT 12 09/20/2011   AST 14 09/20/2011   NA 138 12/14/2011   K 5.3* 12/14/2011   CL 109 12/14/2011   CREATININE 1.8* 12/14/2011   BUN 47* 12/14/2011   CO2 20 12/14/2011   TSH 1.44 12/14/2011   INR 1.31 05/06/2011   HGBA1C 6.6* 12/14/2011     Assessment / Plan:  1. Pre operative clearance for lap chole  2. LV dysfunction  3. CAD with prior MI/CABG - almost one year out  4. Chronic diarrhea  I think we need to update his echo and check some baseline labs before clearing him. While he currently has no active symptoms, he is pretty sedentary. Last EF was 35%.  Further disposition to follow.   Patient is agreeable to this plan and will call if any problems develop in the interim.

## 2012-03-03 NOTE — Progress Notes (Signed)
Received cardiac clearance from Dr. Elease Hashimoto dated 03/02/12. Reviewed by Dr. Derrell Lolling and sent to medical records to be scanned into the chart. Posting sheet for surgery taken to schedulers.

## 2012-03-03 NOTE — Patient Instructions (Addendum)
I refilled the Bidil today  We are going to get labs today and update your ultrasound of your heart - we will then decide about clearing you for your surgery  Stay on your current medicines  See Dr. Elease Hashimoto as planned in May  Call the [facility] Care office at [phone] if you have any questions, problems or concerns.

## 2012-03-06 ENCOUNTER — Telehealth: Payer: Self-pay | Admitting: Family Medicine

## 2012-03-06 NOTE — Telephone Encounter (Signed)
Caller: Sandy/Friend; Phone: [phone]; Reason for Call: He is to have surgery to remove gall bladder on 2-24 and he went to cardiology for clearance and they have noted that he needs some additional base line labs.  He said he thought his doctor was out for a couple weeks I advised him that any doctor could order what is needed since note in in EMR Advised to call back for appt

## 2012-03-07 ENCOUNTER — Telehealth: Payer: Self-pay | Admitting: Nurse Practitioner

## 2012-03-07 DIAGNOSIS — N19 Unspecified kidney failure: Secondary | ICD-10-CM

## 2012-03-07 NOTE — Telephone Encounter (Signed)
Shiraz called back. Ultrasound to be done Thurs, 03/09/12.  His impression is the labs need to be done prior to ultrasound. Pls advise.

## 2012-03-07 NOTE — Telephone Encounter (Signed)
S/w pts partner was heated, stated he has been on the phone for two days trying to get his partners blood work taken care of, stated [place] office could not see where Kurt Richard needed more lab work, it was stated in the lab results. Kurt Richard stated pt should come here to get bmet,cbc and if lab work hasn't improved the pt will be referred to nephrology per Dr. Elease Hashimoto

## 2012-03-07 NOTE — Telephone Encounter (Signed)
Pt was told to have blood work done at [facility],  no order, needs to have order put in

## 2012-03-07 NOTE — Telephone Encounter (Signed)
Pts partner apologized for snapping at me, I took care of repeat lab work bmet/cbc that is being drawn at our office on 03/09/12 before his ultrasound pts partner verbally understood

## 2012-03-08 NOTE — Telephone Encounter (Signed)
He had a CBC and BMET last week per Cardiology. They already have future orders in place for a repeat BMET and CBC. He just needs to come in to have these drawn

## 2012-03-08 NOTE — Telephone Encounter (Signed)
I spoke with Shahrukh and this has been taken care of.

## 2012-03-09 ENCOUNTER — Ambulatory Visit (HOSPITAL_COMMUNITY): Payer: 59 | Attending: Cardiology | Admitting: Radiology

## 2012-03-09 ENCOUNTER — Ambulatory Visit (INDEPENDENT_AMBULATORY_CARE_PROVIDER_SITE_OTHER): Payer: 59 | Admitting: *Deleted

## 2012-03-09 ENCOUNTER — Other Ambulatory Visit: Payer: Self-pay

## 2012-03-09 DIAGNOSIS — N19 Unspecified kidney failure: Secondary | ICD-10-CM

## 2012-03-09 DIAGNOSIS — I2510 Atherosclerotic heart disease of native coronary artery without angina pectoris: Secondary | ICD-10-CM | POA: Insufficient documentation

## 2012-03-09 DIAGNOSIS — I2589 Other forms of chronic ischemic heart disease: Secondary | ICD-10-CM | POA: Insufficient documentation

## 2012-03-09 DIAGNOSIS — E785 Hyperlipidemia, unspecified: Secondary | ICD-10-CM | POA: Insufficient documentation

## 2012-03-09 DIAGNOSIS — I519 Heart disease, unspecified: Secondary | ICD-10-CM

## 2012-03-09 DIAGNOSIS — Z01810 Encounter for preprocedural cardiovascular examination: Secondary | ICD-10-CM

## 2012-03-09 DIAGNOSIS — E119 Type 2 diabetes mellitus without complications: Secondary | ICD-10-CM | POA: Insufficient documentation

## 2012-03-09 DIAGNOSIS — I10 Essential (primary) hypertension: Secondary | ICD-10-CM | POA: Insufficient documentation

## 2012-03-09 LAB — CBC WITH DIFFERENTIAL/PLATELET
Basophils Absolute: 0.1 10*3/uL (ref 0.0–0.1)
Basophils Relative: 0.8 % (ref 0.0–3.0)
Eosinophils Absolute: 0.5 10*3/uL (ref 0.0–0.7)
Eosinophils Relative: 4.5 % (ref 0.0–5.0)
HCT: 28.3 % — ABNORMAL LOW (ref 39.0–52.0)
Hemoglobin: 9.5 g/dL — ABNORMAL LOW (ref 13.0–17.0)
Lymphocytes Relative: 13.2 % (ref 12.0–46.0)
Lymphs Abs: 1.4 10*3/uL (ref 0.7–4.0)
MCHC: 33.5 g/dL (ref 30.0–36.0)
MCV: 90.5 fl (ref 78.0–100.0)
Monocytes Absolute: 0.7 10*3/uL (ref 0.1–1.0)
Monocytes Relative: 6.4 % (ref 3.0–12.0)
Neutro Abs: 7.9 10*3/uL — ABNORMAL HIGH (ref 1.4–7.7)
Neutrophils Relative %: 75.1 % (ref 43.0–77.0)
Platelets: 277 10*3/uL (ref 150.0–400.0)
RBC: 3.12 Mil/uL — ABNORMAL LOW (ref 4.22–5.81)
RDW: 16.3 % — ABNORMAL HIGH (ref 11.5–14.6)
WBC: 10.6 10*3/uL — ABNORMAL HIGH (ref 4.5–10.5)

## 2012-03-09 LAB — BASIC METABOLIC PANEL
BUN: 60 mg/dL — ABNORMAL HIGH (ref 6–23)
CO2: 19 mEq/L (ref 19–32)
Calcium: 8.8 mg/dL (ref 8.4–10.5)
Chloride: 109 mEq/L (ref 96–112)
Creatinine, Ser: 2.3 mg/dL — ABNORMAL HIGH (ref 0.4–1.5)
GFR: 30.8 mL/min — ABNORMAL LOW (ref >60.00)
Glucose, Bld: 134 mg/dL — ABNORMAL HIGH (ref 70–99)
Potassium: 5.3 mEq/L — ABNORMAL HIGH (ref 3.5–5.1)
Sodium: 138 mEq/L (ref 135–145)

## 2012-03-09 NOTE — Progress Notes (Signed)
Echocardiogram performed.  

## 2012-03-10 ENCOUNTER — Other Ambulatory Visit: Payer: 59

## 2012-03-10 DIAGNOSIS — R197 Diarrhea, unspecified: Secondary | ICD-10-CM

## 2012-03-10 DIAGNOSIS — K8050 Calculus of bile duct without cholangitis or cholecystitis without obstruction: Secondary | ICD-10-CM

## 2012-03-13 ENCOUNTER — Other Ambulatory Visit: Payer: Self-pay | Admitting: Gastroenterology

## 2012-03-13 LAB — GASTROINTESTINAL PATHOGEN PANEL PCR
C. difficile Tox A/B, PCR: NEGATIVE
Campylobacter, PCR: NEGATIVE
Cryptosporidium, PCR: NEGATIVE
E coli (ETEC) LT/ST PCR: NEGATIVE
E coli (STEC) stx1/stx2, PCR: NEGATIVE
E coli 0157, PCR: NEGATIVE
Norovirus, PCR: NEGATIVE

## 2012-03-14 ENCOUNTER — Encounter: Payer: Self-pay | Admitting: *Deleted

## 2012-03-15 ENCOUNTER — Encounter: Payer: Self-pay | Admitting: Family Medicine

## 2012-03-15 ENCOUNTER — Ambulatory Visit (INDEPENDENT_AMBULATORY_CARE_PROVIDER_SITE_OTHER): Payer: 59 | Admitting: Family Medicine

## 2012-03-15 VITALS — BP 110/60 | Temp 98.7°F | Wt 184.0 lb

## 2012-03-15 DIAGNOSIS — E119 Type 2 diabetes mellitus without complications: Secondary | ICD-10-CM

## 2012-03-15 DIAGNOSIS — N189 Chronic kidney disease, unspecified: Secondary | ICD-10-CM

## 2012-03-15 DIAGNOSIS — K8050 Calculus of bile duct without cholangitis or cholecystitis without obstruction: Secondary | ICD-10-CM

## 2012-03-15 DIAGNOSIS — N179 Acute kidney failure, unspecified: Secondary | ICD-10-CM

## 2012-03-15 NOTE — Progress Notes (Signed)
Subjective:    Patient ID: Kurt Richard, male    DOB: 10-29-1945, 67 y.o.   MRN: [id number]  HPI  Patient here for medical followup. He has some chronic diarrhea for almost one year duration with extensive evaluation per GI. He has known gallstones and plans for elective cholecystectomy. Surgery was waiting on cardiac clearance. Patient had recent echocardiogram and lab significant for hemoglobin 9.3 creatinine 2.8 with potassium 5.7. Lasix dosage was reduced and an most recent basic metabolic panel on 03-09-12 creatinine 2.3 with potassium 5.3.  Patient has never had nephrology evaluation. No nonsteroidal use. No ACE inh use.  Diabetes been stable fasting blood sugars run 130 and most recent A1c 6.6%. Patient does not take any potassium supplementation.  Past Medical History  Diagnosis Date  . Diabetes mellitus   . Hypertension   . Chronic systolic heart failure     echo 04/12/11: Mild LVH, EF 30-35%, mid to distal anteroseptal and apical hypokinesis, grade 2 diastolic dysfunction, moderate LAE, mild RAE.  [name] Hyperlipidemia   . Tobacco abuse   . CAD (coronary artery disease)     acute anterior STEMI, late presentation 04/06/11 LHC 3/5 demonstrated severe ostial left main 95% stenosis and otherwise three-vessel CAD with an ejection fraction of 15%.  Emergent CABG: Dr. Dorris Fetch - Grafts: LIMA-LAD, SVG-D1, SVG-OM1, SVG-PDA and PL.  . Ischemic cardiomyopathy   . DM2 (diabetes mellitus, type 2)   . 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 Acute renal insufficiency     05/2011 admission (cr 1.54 at discharge)  . Anemia     Post-CABG  . Arthritis   . Stroke   . Sleep apnea   . Neuromuscular disorder     diabetic neuropathy  . Diarrhea     has had c diff diarrhea  . Weakness   . Bruises easily    Past Surgical History  Procedure Laterality Date  . Coronary artery bypass graft  04/06/2011    Procedure: CORONARY ARTERY BYPASS GRAFTING (CABG);  Surgeon: Loreli Slot, MD;   Location: [facility] OR;  Service: Open Heart Surgery;  Laterality: N/A;  Coronary Artery Bypass Graft times four utilizing the left internal mammary artery and the Right and left  greater Saphenous veins Harvested endoscopically.  . Fibroid bypass 04/06/11    . Colon surgery      rectal fissure  . Cervical disc surgery  1997 - approximate  . Colonoscopy  07/29/2011    Procedure: COLONOSCOPY;  Surgeon: Rachael Fee, MD;  Location: [facility];  Service: Endoscopy;  Laterality: N/A;  . Eye surgery  2011    cataract removal    reports that he quit smoking about a year ago. His smoking use included Cigarettes. He smoked 0.50 packs per day. He has never used smokeless tobacco. He reports that he does not drink alcohol or use illicit drugs. family history includes Congestive Heart Failure in his father; Diabetes in his mother and sister; and Heart attack in his mother and sister. Allergies  Allergen Reactions  . Diltiazem Hcl Hives and Swelling  . Nifedipine Hives and Swelling      Review of Systems  Constitutional: Positive for fatigue. Negative for fever and chills.  Respiratory: Negative for cough and shortness of breath.   Gastrointestinal: Positive for diarrhea. Negative for nausea and vomiting.  Genitourinary: Negative for dysuria.  Neurological: Negative for dizziness and syncope.       Objective:   Physical Exam  Constitutional: He  appears well-developed and well-nourished.  HENT:  Mouth/Throat: Oropharynx is clear and moist.  Neck: Neck supple. No thyromegaly present.  Cardiovascular: Normal rate and regular rhythm.   Pulmonary/Chest: Effort normal and breath sounds normal. No respiratory distress. He has no wheezes. He has no rales.  Musculoskeletal: He exhibits no edema.  Psychiatric: He has a normal mood and affect. His behavior is normal.          Assessment & Plan:  #1 type 2 diabetes. History of good control. Recheck A1c at followup #2 chronic kidney disease. Acute  on Chronic renal failure.  Recent creatinine 2.3 which actually improved after his Lasix dosage was reduced so suspect a least some element of volume contraction. We have recommended referral to nephrology and patient agrees.  He has had some mild persistent hyperkalemia in the absence of K replacement or ACE use. #3 chronic diarrhea with extensive workup by gastroenterology #4 gallstones-awaiting cholecystectomy.

## 2012-03-17 ENCOUNTER — Encounter (HOSPITAL_COMMUNITY): Payer: Self-pay | Admitting: Pharmacy Technician

## 2012-03-18 ENCOUNTER — Other Ambulatory Visit: Payer: Self-pay | Admitting: Gastroenterology

## 2012-03-21 ENCOUNTER — Encounter (HOSPITAL_COMMUNITY)
Admission: RE | Admit: 2012-03-21 | Discharge: 2012-03-21 | Disposition: A | Discharge status: Home / Self Care | Payer: 59 | Source: Ambulatory Visit | Attending: General Surgery | Admitting: General Surgery

## 2012-03-27 ENCOUNTER — Ambulatory Visit: Admit: 2012-03-27 | Payer: 59 | Admitting: General Surgery

## 2012-03-27 SURGERY — LAPAROSCOPIC CHOLECYSTECTOMY WITH INTRAOPERATIVE CHOLANGIOGRAM
Anesthesia: General

## 2012-03-27 NOTE — Progress Notes (Signed)
Clearance done, see letter

## 2012-04-13 ENCOUNTER — Encounter: Payer: Self-pay | Admitting: Cardiovascular Disease

## 2012-04-13 ENCOUNTER — Ambulatory Visit (INDEPENDENT_AMBULATORY_CARE_PROVIDER_SITE_OTHER): Payer: Medicare Other | Admitting: Cardiovascular Disease

## 2012-04-13 VITALS — BP 160/80 | HR 81 | Ht 66.5 in | Wt 191.8 lb

## 2012-04-13 DIAGNOSIS — I10 Essential (primary) hypertension: Secondary | ICD-10-CM

## 2012-04-13 DIAGNOSIS — I5022 Chronic systolic (congestive) heart failure: Secondary | ICD-10-CM

## 2012-04-13 DIAGNOSIS — N19 Unspecified kidney failure: Secondary | ICD-10-CM

## 2012-04-13 DIAGNOSIS — D649 Anemia, unspecified: Secondary | ICD-10-CM

## 2012-04-13 DIAGNOSIS — I2510 Atherosclerotic heart disease of native coronary artery without angina pectoris: Secondary | ICD-10-CM

## 2012-04-13 NOTE — Assessment & Plan Note (Signed)
Kurt Richard is doing well.  He has not had any angina.  His LV function has improved.  He generally looks much better.

## 2012-04-13 NOTE — Progress Notes (Signed)
Kurt Richard Date of Birth  Mar 06, 1945 [facility]     [place] Office  [address]    [address]   [address] [place], [place]  [address]    [place], [place]  [address] [phone]  Fax  [phone]  [phone]  Fax [phone]  Problem list: 1. Acute on chronic systolic CHF  Echo Feb. 2014 Left ventricle: The cavity size was normal.  moderate LVH.  The estimated ejection fraction was in the range of 35% to 40%. There is moderate hypokinesis of the mid-distalanteroseptal myocardium.  pseudonormal left ventricular filling pattern, with concomitant abnormal relaxation and increased filling pressure (grade 2 diastolic dysfunction). - Aortic valve: There was very mild stenosis. Mean gradient:80mm Hg (S). Peak gradient: 13mm Hg (S). - Mitral valve: Calcified annulus. Mildly thickened leaflets .- Left atrium: The atrium was moderately dilated. - Right ventricle: The cavity size was mildly dilated. - Right atrium: The atrium was mildly dilated.  2. C. Diff colitis. 3. Acute renal insufficiency, d/c Cr 1.54  4. CAD - recent late-presenting anterior MI 04/2011 with 3V dz s/p CABG   - postop course 04/2011 complicated by LV dysfunction requiring IABP/inotropes, ABL anemia, CVA  5.  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 Diabetes mellitus  7. Tobacco abuse  8. hypertension 9. hyperlipidemia  History of Present Illness:  Kurt Richard is a 67 year old gentleman with the above-noted medical history.  He is still having problems with his abdomen and the diarrhea.  The last stool sample was negative for c. Diff. He needs to have another ERCP at [facility]. He has done well from a cardiac standpoint.  No further exacerbations of CHf.  No angina.  April 13, 2012: Kurt Richard is doing better.  He saw [name] for pre-op evaluation prior gallbladder surgery. He had an echocardiogram which revealed some improvement of his left ventricular systolic function. His ejection fraction is now 35-40%.  His  preop labs also revealed that he has chronic renal insufficiency with a creatinine of 2.3. His BUN is also elevated. In addition, he also has anemia.  His Lasix was decreased to 40 mg 3 times a week after seeing his elevated creatinine.  His breathing remains the same. He occasionally will take an extra Lasix if he develops chest congestion.   Current Outpatient Prescriptions on File Prior to Visit  Medication Sig Dispense Refill  . aspirin 325 MG EC tablet Take 325 mg by mouth daily.      . Blood Glucose Monitoring Suppl [facility] BLOOD GLUCOSE SYSTEM) DEVI Dispensed 09/13/11      . diphenoxylate-atropine (LOMOTIL) 2.5-0.025 MG per tablet Take 1 tablet by mouth 4 (four) times daily as needed for diarrhea or loose stools.       . fluticasone (FLONASE) 50 MCG/ACT nasal spray Place 2 sprays into the nose daily as needed for allergies.       . furosemide (LASIX) 40 MG tablet Take 40 mg by mouth 3 (three) times a week.       [name] glucose blood (BAYER CONTOUR TEST) test strip Use daily as instructed  100 each  3  . isosorbide-hydrALAZINE (BIDIL) 20-37.5 MG per tablet Take 1 tablet by mouth 2 (two) times daily.      [name] loperamide (IMODIUM A-D) 2 MG tablet Take 2 mg by mouth 2 (two) times daily as needed for diarrhea or loose stools.       . methimazole (TAPAZOLE) 5 MG tablet Take 5 mg by mouth 2 (two) times daily.      [name]  metoprolol succinate (TOPROL-XL) 50 MG 24 hr tablet Take 50 mg by mouth 2 (two) times daily.      . nitroGLYCERIN (NITROSTAT) 0.4 MG SL tablet Place 0.4 mg under the tongue every 5 (five) minutes as needed for chest pain.       [name] oxyCODONE (OXY IR/ROXICODONE) 5 MG immediate release tablet Take 5 mg by mouth as needed for pain. Per Dr. Georgann Housekeeper      . paregoric 2 MG/5ML solution Take 5 mLs by mouth 4 (four) times daily as needed for diarrhea or loose stools.       No current facility-administered medications on file prior to visit.    Allergies  Allergen Reactions  . Diltiazem Hcl Hives and  Swelling  . Nifedipine Hives and Swelling    Past Medical History  Diagnosis Date  . Diabetes mellitus   . Hypertension   . Chronic systolic heart failure     echo 04/12/11: Mild LVH, EF 30-35%, mid to distal anteroseptal and apical hypokinesis, grade 2 diastolic dysfunction, moderate LAE, mild RAE.  [name] Hyperlipidemia   . Tobacco abuse   . CAD (coronary artery disease)     acute anterior STEMI, late presentation 04/06/11 LHC 3/5 demonstrated severe ostial left main 95% stenosis and otherwise three-vessel CAD with an ejection fraction of 15%.  Emergent CABG: Dr. Dorris Fetch - Grafts: LIMA-LAD, SVG-D1, SVG-OM1, SVG-PDA and PL.  . Ischemic cardiomyopathy   . DM2 (diabetes mellitus, type 2)   . 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 Acute renal insufficiency     05/2011 admission (cr 1.54 at discharge)  . Anemia     Post-CABG  . Arthritis   . Stroke   . Sleep apnea   . Neuromuscular disorder     diabetic neuropathy  . Diarrhea     has had c diff diarrhea  . Weakness   . Bruises easily     Past Surgical History  Procedure Laterality Date  . Coronary artery bypass graft  04/06/2011    Procedure: CORONARY ARTERY BYPASS GRAFTING (CABG);  Surgeon: Loreli Slot, MD;  Location: [facility] OR;  Service: Open Heart Surgery;  Laterality: N/A;  Coronary Artery Bypass Graft times four utilizing the left internal mammary artery and the Right and left  greater Saphenous veins Harvested endoscopically.  . Fibroid bypass 04/06/11    . Colon surgery      rectal fissure  . Cervical disc surgery  1997 - approximate  . Colonoscopy  07/29/2011    Procedure: COLONOSCOPY;  Surgeon: Rachael Fee, MD;  Location: [facility];  Service: Endoscopy;  Laterality: N/A;  . Eye surgery  2011    cataract removal    History  Smoking status  . Former Smoker -- 0.50 packs/day  . Types: Cigarettes  . Quit date: 04/06/2011  Smokeless tobacco  . Never Used    History  Alcohol Use No    Family  History  Problem Relation Age of Onset  . Heart attack Mother     ?93s  . Diabetes Mother   . Heart attack Sister     23s  . Diabetes Sister     Dietitian  . Congestive Heart Failure Father     Reviw of Systems:  Reviewed in the HPI.  All other systems are negative.  Physical Exam: Blood pressure 160/80, pulse 81, height 5' 6.5" (1.689 m), weight 191 lb 12.8 oz (87 kg), SpO2 98.00%. General: Well developed, well nourished, in no  acute distress.  He appears to be generally stronger than he was during his last several visits. Head: Normocephalic, atraumatic, sclera non-icteric, mucus membranes are moist,   Neck: Supple. Carotids are 2 + without bruits. No JVD  Lungs: Clear bilaterally to auscultation.  Heart: regular rate.  normal  S1 S2. No murmurs, gallops or rubs.    Abdomen: Soft, non-tender, non-distended with normal bowel sounds. No hepatomegaly. No rebound/guarding. No masses.  Msk:  Strength and tone are normal  Extremities: No clubbing or cyanosis. No edema.  Distal pedal pulses are 2+ and equal bilaterally.  Neuro: Alert and oriented X 3. Moves all extremities spontaneously.  Psych:  Responds to questions appropriately with a normal affect.  ECG:  Assessment / Plan:

## 2012-04-13 NOTE — Patient Instructions (Addendum)
Your physician recommends that you return for lab work in: BMET, CBC  Your physician wants you to follow-up in: 6 MONTHS  You will receive a reminder letter in the mail two months in advance. If you don't receive a letter, please call our office to schedule the follow-up appointment.   Your physician recommends that you return for a FASTING lipid profile: 6 MONTHS

## 2012-04-13 NOTE — Assessment & Plan Note (Signed)
His LV  function has improved.  I think that he would be at moderate risk for his cholecystectomy. He now is having second thoughts about having the surgery.

## 2012-04-13 NOTE — Assessment & Plan Note (Signed)
His blood pressures little bit elevated today. We will have him check his blood pressure on a regular basis. If his blood pressure remains elevated, we will substitute carvedilol for his metoprolol. He has chronic renal insufficiency and cannot take an ACE inhibitor or ARB. He has also gained some  weight over the past several months. We'll have him work on a better diet.  I will see him in several months.

## 2012-04-14 LAB — BASIC METABOLIC PANEL
GFR: 32.42 mL/min — ABNORMAL LOW (ref >60.00)
Glucose, Bld: 123 mg/dL — ABNORMAL HIGH (ref 70–99)
Potassium: 5.2 mEq/L — ABNORMAL HIGH (ref 3.5–5.1)
Sodium: 141 mEq/L (ref 135–145)

## 2012-04-14 LAB — CBC
Hemoglobin: 9.1 g/dL — ABNORMAL LOW (ref 13.0–17.0)
Platelets: 258 10*3/uL (ref 150.0–400.0)
RBC: 3.03 Mil/uL — ABNORMAL LOW (ref 4.22–5.81)
WBC: 9.3 10*3/uL (ref 4.5–10.5)

## 2012-05-05 ENCOUNTER — Telehealth: Payer: Self-pay | Admitting: *Deleted

## 2012-05-05 NOTE — Telephone Encounter (Signed)
BP average 157/64 in am , average pm 135/73,  101/51 x 1 reading and held bp med. Wt remaining the same, c/o 1+ edema at times. Pt has taken occasional extra lasix for edema, told him that Dr Elease Hashimoto will review and we will call if any further advise is needed. Pt agreed to plan.

## 2012-05-05 NOTE — Telephone Encounter (Signed)
These look OK for now.

## 2012-05-16 ENCOUNTER — Other Ambulatory Visit: Payer: Self-pay | Admitting: Family Medicine

## 2012-05-17 ENCOUNTER — Other Ambulatory Visit: Payer: Self-pay | Admitting: *Deleted

## 2012-05-17 MED ORDER — FUROSEMIDE 40 MG PO TABS: 40.0000 mg | ORAL_TABLET | ORAL | Status: DC

## 2012-05-18 ENCOUNTER — Other Ambulatory Visit: Payer: Self-pay | Admitting: *Deleted

## 2012-05-18 MED ORDER — FUROSEMIDE 40 MG PO TABS: 40.0000 mg | ORAL_TABLET | ORAL | Status: DC

## 2012-05-18 NOTE — Telephone Encounter (Signed)
Fax Received. Refill Completed. Mickala Laton Chowoe (R.M.A)   

## 2012-05-24 ENCOUNTER — Other Ambulatory Visit: Payer: Self-pay | Admitting: Family Medicine

## 2012-05-24 ENCOUNTER — Other Ambulatory Visit: Payer: Self-pay

## 2012-05-24 ENCOUNTER — Other Ambulatory Visit: Payer: Self-pay | Admitting: Gastroenterology

## 2012-05-24 ENCOUNTER — Encounter: Payer: Self-pay | Admitting: Gastroenterology

## 2012-05-24 MED ORDER — DIPHENOXYLATE-ATROPINE 2.5-0.025 MG PO TABS: 1.0000 | ORAL_TABLET | Freq: Four times a day (QID) | ORAL | Status: DC | PRN

## 2012-05-24 NOTE — Telephone Encounter (Signed)
Dr Christella Hartigan printed prescription and I can not find where it went.  Reprinted and pt aware

## 2012-05-24 NOTE — Telephone Encounter (Signed)
Can we refill? 

## 2012-05-26 ENCOUNTER — Other Ambulatory Visit: Payer: Self-pay | Admitting: *Deleted

## 2012-06-21 ENCOUNTER — Other Ambulatory Visit (HOSPITAL_COMMUNITY): Payer: Self-pay | Admitting: *Deleted

## 2012-06-21 ENCOUNTER — Encounter (HOSPITAL_COMMUNITY): Payer: Medicare Other

## 2012-06-22 ENCOUNTER — Encounter (HOSPITAL_COMMUNITY)
Admission: RE | Admit: 2012-06-22 | Discharge: 2012-06-22 | Disposition: A | Discharge status: Home / Self Care | Payer: Medicare Other | Source: Ambulatory Visit | Attending: Nephrology | Admitting: Nephrology

## 2012-06-22 DIAGNOSIS — D638 Anemia in other chronic diseases classified elsewhere: Secondary | ICD-10-CM | POA: Insufficient documentation

## 2012-06-22 DIAGNOSIS — N183 Chronic kidney disease, stage 3 unspecified: Secondary | ICD-10-CM | POA: Insufficient documentation

## 2012-06-22 LAB — POCT HEMOGLOBIN-HEMACUE: Hemoglobin: 10 g/dL — ABNORMAL LOW (ref 13.0–17.0)

## 2012-06-22 MED ORDER — EPOETIN ALFA 10000 UNIT/ML IJ SOLN: 4000.0000 [IU] | INTRAMUSCULAR | Status: DC

## 2012-06-22 MED ORDER — EPOETIN ALFA 10000 UNIT/ML IJ SOLN
INTRAMUSCULAR | Status: AC
  Administered 2012-06-22: 3000 [IU]
  Filled 2012-06-22: qty 1

## 2012-06-22 MED ORDER — EPOETIN ALFA 3000 UNIT/ML IJ SOLN
3000.0000 [IU] | INTRAMUSCULAR | Status: DC
  Filled 2012-06-22: qty 1

## 2012-06-22 MED ORDER — EPOETIN ALFA 3000 UNIT/ML IJ SOLN: 3000.0000 [IU] | INTRAMUSCULAR | Status: DC

## 2012-06-29 ENCOUNTER — Encounter: Payer: Self-pay | Admitting: Cardiovascular Disease

## 2012-06-29 ENCOUNTER — Ambulatory Visit (INDEPENDENT_AMBULATORY_CARE_PROVIDER_SITE_OTHER): Payer: Medicare Other | Admitting: Cardiovascular Disease

## 2012-06-29 ENCOUNTER — Encounter (HOSPITAL_COMMUNITY)
Admission: RE | Admit: 2012-06-29 | Discharge: 2012-06-29 | Disposition: A | Discharge status: Home / Self Care | Payer: Medicare Other | Source: Ambulatory Visit | Attending: Nephrology | Admitting: Nephrology

## 2012-06-29 VITALS — BP 178/77 | HR 79 | Ht 66.5 in | Wt 190.0 lb

## 2012-06-29 DIAGNOSIS — I739 Peripheral vascular disease, unspecified: Secondary | ICD-10-CM

## 2012-06-29 DIAGNOSIS — I10 Essential (primary) hypertension: Secondary | ICD-10-CM

## 2012-06-29 MED ORDER — EPOETIN ALFA 3000 UNIT/ML IJ SOLN
3000.0000 [IU] | INTRAMUSCULAR | Status: DC
  Filled 2012-06-29: qty 1

## 2012-06-29 MED ORDER — CARVEDILOL 25 MG PO TABS: 25.0000 mg | ORAL_TABLET | Freq: Two times a day (BID) | ORAL | Status: DC

## 2012-06-29 MED ORDER — EPOETIN ALFA 10000 UNIT/ML IJ SOLN
INTRAMUSCULAR | Status: AC
  Administered 2012-06-29: 3000 [IU] via SUBCUTANEOUS
  Filled 2012-06-29: qty 1

## 2012-06-29 NOTE — Assessment & Plan Note (Signed)
His BP remains elevated.  He is intolerant to Diltiazem and Nifedipine.  I suspect he would have a similar reaction to amlodipine.  Will DC his Metoprolol and start him on Carvedilol 25 BID.   We can add cardura if needed for additional BP control at his next visit if needed.

## 2012-06-29 NOTE — Assessment & Plan Note (Signed)
Stable, no edema

## 2012-06-29 NOTE — Assessment & Plan Note (Signed)
He has improved greatly from a symptom standpoint.  Will continue to work on hi SBP control.  I have advised him to work on his weight loss.

## 2012-06-29 NOTE — Progress Notes (Signed)
Kurt Richard Date of Birth  30-Mar-1945 [facility]     [place] Office  [address]    [address]   [address] [place], [place]  [address]    [place], [place]  [address] [phone]  Fax  [phone]  [phone]  Fax [phone]  Problem list: 1. Acute on chronic systolic CHF  Echo Feb. 2014 Left ventricle: The cavity size was normal.  moderate LVH.  The estimated ejection fraction was in the range of 35% to 40%. There is moderate hypokinesis of the mid-distalanteroseptal myocardium.  pseudonormal left ventricular filling pattern, with concomitant abnormal relaxation and increased filling pressure (grade 2 diastolic dysfunction). - Aortic valve: There was very mild stenosis. Mean gradient:80mm Hg (S). Peak gradient: 13mm Hg (S). - Mitral valve: Calcified annulus. Mildly thickened leaflets .- Left atrium: The atrium was moderately dilated. - Right ventricle: The cavity size was mildly dilated. - Right atrium: The atrium was mildly dilated.  2. C. Diff colitis. 3. Acute renal insufficiency, d/c Cr 1.54  4. CAD - recent late-presenting anterior MI 04/2011 with 3V dz s/p CABG   - postop course 04/2011 complicated by LV dysfunction requiring IABP/inotropes, ABL anemia, CVA  5.  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 Diabetes mellitus  7. Tobacco abuse  8. hypertension 9. hyperlipidemia  History of Present Illness:  Dent is a 67 year old gentleman with the above-noted medical history.  He is still having problems with his abdomen and the diarrhea.  The last stool sample was negative for c. Diff. He needs to have another ERCP at [facility]. He has done well from a cardiac standpoint.  No further exacerbations of CHf.  No angina.  April 13, 2012: Rion is doing better.  He saw [name] for pre-op evaluation prior gallbladder surgery. He had an echocardiogram which revealed some improvement of his left ventricular systolic function. His ejection fraction is now 35-40%.  His  preop labs also revealed that he has chronic renal insufficiency with a creatinine of 2.3. His BUN is also elevated. In addition, he also has anemia.  His Lasix was decreased to 40 mg 3 times a week after seeing his elevated creatinine.  His breathing remains the same. He occasionally will take an extra Lasix if he develops chest congestion.  Jun 29, 2012:  He's not having episodes of chest pain or shortness of breath. Heaton is making some slow progress.  He is not able to exercise because of left calf pain. He is only able to stand on his legs for several minutes.  He also has neuropathy which limits his exercise. He has not been eating any extra salt.   His baseline creatinine is 2.2.  He does not want to start on dialysis.    Current Outpatient Prescriptions on File Prior to Visit  Medication Sig Dispense Refill  . aspirin 325 MG EC tablet Take 325 mg by mouth daily.      . Blood Glucose Monitoring Suppl [facility] BLOOD GLUCOSE SYSTEM) DEVI Dispensed 09/13/11      . diphenoxylate-atropine (LOMOTIL) 2.5-0.025 MG per tablet Take 1 tablet by mouth 4 (four) times daily as needed for diarrhea or loose stools.  30 tablet  11  . fluticasone (FLONASE) 50 MCG/ACT nasal spray Place 2 sprays into the nose daily as needed for allergies.       . furosemide (LASIX) 40 MG tablet Take 1 tablet (40 mg total) by mouth 3 (three) times a week.  30 tablet  5  .  glucose blood (BAYER CONTOUR TEST) test strip Use daily as instructed  100 each  3  . isosorbide-hydrALAZINE (BIDIL) 20-37.5 MG per tablet Take 1 tablet by mouth 2 (two) times daily.      [name] loperamide (IMODIUM A-D) 2 MG tablet Take 2 mg by mouth 2 (two) times daily as needed for diarrhea or loose stools.       . methimazole (TAPAZOLE) 5 MG tablet Take 5 mg by mouth 2 (two) times daily.      . metoprolol succinate (TOPROL-XL) 50 MG 24 hr tablet Take 50 mg by mouth 2 (two) times daily.      . nitroGLYCERIN (NITROSTAT) 0.4 MG SL tablet Place 0.4 mg under the  tongue every 5 (five) minutes as needed for chest pain.       [name] oxyCODONE (OXY IR/ROXICODONE) 5 MG immediate release tablet Take 5 mg by mouth as needed for pain. Per Dr. Georgann Housekeeper      . paregoric 2 MG/5ML solution Take 5 mLs by mouth 4 (four) times daily as needed for diarrhea or loose stools.       No current facility-administered medications on file prior to visit.    Allergies  Allergen Reactions  . Diltiazem Hcl Hives and Swelling  . Nifedipine Hives and Swelling    Past Medical History  Diagnosis Date  . Diabetes mellitus   . Hypertension   . Chronic systolic heart failure     echo 04/12/11: Mild LVH, EF 30-35%, mid to distal anteroseptal and apical hypokinesis, grade 2 diastolic dysfunction, moderate LAE, mild RAE.  [name] Hyperlipidemia   . Tobacco abuse   . CAD (coronary artery disease)     acute anterior STEMI, late presentation 04/06/11 LHC 3/5 demonstrated severe ostial left main 95% stenosis and otherwise three-vessel CAD with an ejection fraction of 15%.  Emergent CABG: Dr. Dorris Fetch - Grafts: LIMA-LAD, SVG-D1, SVG-OM1, SVG-PDA and PL.  . Ischemic cardiomyopathy   . DM2 (diabetes mellitus, type 2)   . 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 Acute renal insufficiency     05/2011 admission (cr 1.54 at discharge)  . Anemia     Post-CABG  . Arthritis   . Stroke   . Sleep apnea   . Neuromuscular disorder     diabetic neuropathy  . Diarrhea     has had c diff diarrhea  . Weakness   . Bruises easily     Past Surgical History  Procedure Laterality Date  . Coronary artery bypass graft  04/06/2011    Procedure: CORONARY ARTERY BYPASS GRAFTING (CABG);  Surgeon: Loreli Slot, MD;  Location: [facility] OR;  Service: Open Heart Surgery;  Laterality: N/A;  Coronary Artery Bypass Graft times four utilizing the left internal mammary artery and the Right and left  greater Saphenous veins Harvested endoscopically.  . Fibroid bypass 04/06/11    . Colon surgery      rectal fissure   . Cervical disc surgery  1997 - approximate  . Colonoscopy  07/29/2011    Procedure: COLONOSCOPY;  Surgeon: Rachael Fee, MD;  Location: [facility];  Service: Endoscopy;  Laterality: N/A;  . Eye surgery  2011    cataract removal    History  Smoking status  . Former Smoker -- 0.50 packs/day  . Types: Cigarettes  . Quit date: 04/06/2011  Smokeless tobacco  . Never Used    History  Alcohol Use No    Family History  Problem Relation Age of  Onset  . Heart attack Mother     ?30s  . Diabetes Mother   . Heart attack Sister     33s  . Diabetes Sister     Dietitian  . Congestive Heart Failure Father     Reviw of Systems:  Reviewed in the HPI.  All other systems are negative.  Physical Exam: Blood pressure 178/77, pulse 79, height 5' 6.5" (1.689 m), weight 190 lb (86.183 kg). General: Well developed, well nourished, in no acute distress.  He appears to be generally stronger than he was during his last several visits. Head: Normocephalic, atraumatic, sclera non-icteric, mucus membranes are moist,   Neck: Supple. Carotids are 2 + without bruits. No JVD  Lungs: Clear bilaterally to auscultation.  Heart: regular rate.  normal  S1 S2. No murmurs, gallops or rubs.    Abdomen: Soft, non-tender, non-distended with normal bowel sounds. No hepatomegaly. No rebound/guarding. No masses.  Msk:  Strength and tone are normal  Extremities: No clubbing or cyanosis. No edema.  Distal pedal pulses are 2+ and equal bilaterally.  Neuro: Alert and oriented X 3. Moves all extremities spontaneously.  Psych:  Responds to questions appropriately with a normal affect.  ECG:  Assessment / Plan:

## 2012-06-29 NOTE — Patient Instructions (Addendum)
Your physician has requested that you have a lower  arterial duplex. This test is an ultrasound of the arteries in the legs or arms. It looks at arterial blood flow in the legs and arms. Allow one hour for Lower and Upper Arterial scans. There are no restrictions or special instructions  Your physician has requested that you have an ankle brachial index (ABI). During this test an ultrasound and blood pressure cuff are used to evaluate the arteries that supply the arms and legs with blood. Allow thirty minutes for this exam. There are no restrictions or special instructions.  Your physician recommends that you schedule a follow-up appointment in: 3 MONTHS   Your physician recommends that you return for lab work in: 3 MONTHS // BMET   Your physician has recommended you make the following change in your medication:   STOP METOPROLOL START CARVEDILOL / COREG 25 MG TWICE DAILY 12 HOURS APART.

## 2012-07-03 ENCOUNTER — Encounter: Payer: Self-pay | Admitting: Nephrology

## 2012-07-04 ENCOUNTER — Encounter (INDEPENDENT_AMBULATORY_CARE_PROVIDER_SITE_OTHER): Payer: Medicare Other

## 2012-07-04 DIAGNOSIS — E1159 Type 2 diabetes mellitus with other circulatory complications: Secondary | ICD-10-CM

## 2012-07-04 DIAGNOSIS — I739 Peripheral vascular disease, unspecified: Secondary | ICD-10-CM

## 2012-07-04 DIAGNOSIS — I10 Essential (primary) hypertension: Secondary | ICD-10-CM

## 2012-07-04 DIAGNOSIS — I70219 Atherosclerosis of native arteries of extremities with intermittent claudication, unspecified extremity: Secondary | ICD-10-CM

## 2012-07-06 ENCOUNTER — Encounter (HOSPITAL_COMMUNITY)
Admission: RE | Admit: 2012-07-06 | Discharge: 2012-07-06 | Disposition: A | Discharge status: Home / Self Care | Payer: Medicare Other | Source: Ambulatory Visit | Attending: Nephrology | Admitting: Nephrology

## 2012-07-06 DIAGNOSIS — D649 Anemia, unspecified: Secondary | ICD-10-CM | POA: Insufficient documentation

## 2012-07-06 LAB — POCT HEMOGLOBIN-HEMACUE: Hemoglobin: 12.6 g/dL — ABNORMAL LOW (ref 13.0–17.0)

## 2012-07-06 LAB — IRON AND TIBC
Saturation Ratios: 15 % — ABNORMAL LOW (ref 20–55)
TIBC: 304 ug/dL (ref 215–435)
UIBC: 259 ug/dL (ref 125–400)

## 2012-07-06 LAB — FERRITIN: Ferritin: 37 ng/mL (ref 22–322)

## 2012-07-06 MED ORDER — EPOETIN ALFA 3000 UNIT/ML IJ SOLN: 3000.0000 [IU] | INTRAMUSCULAR | Status: DC

## 2012-07-07 ENCOUNTER — Encounter: Payer: Self-pay | Admitting: Cardiovascular Disease

## 2012-07-20 ENCOUNTER — Encounter (HOSPITAL_COMMUNITY)
Admission: RE | Admit: 2012-07-20 | Discharge: 2012-07-20 | Disposition: A | Discharge status: Home / Self Care | Payer: Medicare Other | Source: Ambulatory Visit | Attending: Nephrology | Admitting: Nephrology

## 2012-07-20 MED ORDER — EPOETIN ALFA 3000 UNIT/ML IJ SOLN
3000.0000 [IU] | INTRAMUSCULAR | Status: DC
  Filled 2012-07-20: qty 1

## 2012-07-20 MED ORDER — EPOETIN ALFA 10000 UNIT/ML IJ SOLN
INTRAMUSCULAR | Status: AC
  Administered 2012-07-20: 3000 [IU] via SUBCUTANEOUS
  Filled 2012-07-20: qty 1

## 2012-07-21 LAB — POCT HEMOGLOBIN-HEMACUE: Hemoglobin: 9.6 g/dL — ABNORMAL LOW (ref 13.0–17.0)

## 2012-07-25 ENCOUNTER — Ambulatory Visit (INDEPENDENT_AMBULATORY_CARE_PROVIDER_SITE_OTHER): Payer: Medicare Other | Admitting: Cardiovascular Disease

## 2012-07-25 ENCOUNTER — Encounter: Payer: Self-pay | Admitting: Cardiovascular Disease

## 2012-07-25 VITALS — BP 126/72 | HR 88 | Ht 68.5 in | Wt 192.8 lb

## 2012-07-25 DIAGNOSIS — I739 Peripheral vascular disease, unspecified: Secondary | ICD-10-CM

## 2012-07-25 NOTE — Patient Instructions (Addendum)
Admit to [facility] on 08/01/12. You will receive a phone call from the hospital when a bed becomes available.  Your physician has requested that you have a peripheral vascular angiogram on 08/02/12. This exam is performed at the hospital. During this exam IV contrast is used to look at arterial blood flow. Please review the information sheet given for details.  Your physician has requested that you have an Aorto-iliac duplex this week. During this test, an ultrasound is used to evaluate the aorta and iliac arteries.  Do not eat after midnight the day before and avoid carbonated beverages

## 2012-07-26 ENCOUNTER — Encounter (INDEPENDENT_AMBULATORY_CARE_PROVIDER_SITE_OTHER): Payer: Medicare Other

## 2012-07-26 DIAGNOSIS — I700 Atherosclerosis of aorta: Secondary | ICD-10-CM

## 2012-07-26 DIAGNOSIS — I739 Peripheral vascular disease, unspecified: Secondary | ICD-10-CM

## 2012-07-27 ENCOUNTER — Encounter (HOSPITAL_COMMUNITY)
Admission: RE | Admit: 2012-07-27 | Discharge: 2012-07-27 | Disposition: A | Discharge status: Home / Self Care | Payer: Medicare Other | Source: Ambulatory Visit | Attending: Nephrology | Admitting: Nephrology

## 2012-07-27 ENCOUNTER — Encounter (HOSPITAL_COMMUNITY): Payer: Self-pay | Admitting: Pharmacy Technician

## 2012-07-27 LAB — IRON AND TIBC
Iron: 33 ug/dL — ABNORMAL LOW (ref 42–135)
TIBC: 291 ug/dL (ref 215–435)

## 2012-07-27 MED ORDER — EPOETIN ALFA 10000 UNIT/ML IJ SOLN
INTRAMUSCULAR | Status: AC
  Administered 2012-07-27: 3000 [IU]
  Filled 2012-07-27: qty 1

## 2012-07-27 MED ORDER — EPOETIN ALFA 3000 UNIT/ML IJ SOLN
3000.0000 [IU] | INTRAMUSCULAR | Status: DC
  Filled 2012-07-27: qty 1

## 2012-07-28 DIAGNOSIS — I739 Peripheral vascular disease, unspecified: Secondary | ICD-10-CM | POA: Insufficient documentation

## 2012-07-28 HISTORY — DX: Peripheral vascular disease, unspecified: I73.9

## 2012-07-28 NOTE — Progress Notes (Signed)
Primary cardiologist: Dr. Elease Hashimoto  HPI  This is a pleasant 67 year old man who was referred by Dr. Elease Hashimoto for evaluation and management of peripheral arterial disease. The patient has known history of coronary artery disease status post anterior MI in 2013. He is status post CABG. He had severe LV systolic dysfunction at that time with gradual improvement. Most recent ejection fraction was 35-40%. He has multiple chronic medical conditions including type 2 diabetes, hypertension, hyperlipidemia and chronic kidney disease. Most recent creatinine was 2.2. He suffers from chronic area related to previous C. difficile colitis. He reports severe symptoms of left calf discomfort which happens after walking only a few steps. He also gets rest pain in the morning after waking up. He feels cramping and stiffness even without walking. There is no lower extremity wounds or ulceration. He does not have significant symptoms on the right side.  Allergies  Allergen Reactions  . Diltiazem Hcl Hives and Swelling  . Nifedipine Hives and Swelling     Current Outpatient Prescriptions on File Prior to Visit  Medication Sig Dispense Refill  . aspirin 325 MG EC tablet Take 325 mg by mouth daily.      . isosorbide-hydrALAZINE (BIDIL) 20-37.5 MG per tablet Take 1 tablet by mouth 2 (two) times daily.      [name] loperamide (IMODIUM A-D) 2 MG tablet Take 2 mg by mouth 2 (two) times daily as needed for diarrhea or loose stools.       . methimazole (TAPAZOLE) 5 MG tablet Take 5 mg by mouth 2 (two) times daily.      [name] oxyCODONE (OXY IR/ROXICODONE) 5 MG immediate release tablet Take 5 mg by mouth as needed for pain. Per Dr. Georgann Housekeeper      . paregoric 2 MG/5ML solution Take 5 mLs by mouth 4 (four) times daily as needed for diarrhea or loose stools.      . sodium bicarbonate 325 MG tablet Take 650 mg by mouth 2 (two) times daily.       . fluticasone (FLONASE) 50 MCG/ACT nasal spray Place 2 sprays into the nose daily as needed for  allergies.       . nitroGLYCERIN (NITROSTAT) 0.4 MG SL tablet Place 0.4 mg under the tongue every 5 (five) minutes as needed for chest pain.        No current facility-administered medications on file prior to visit.     Past Medical History  Diagnosis Date  . Diabetes mellitus   . Hypertension   . Chronic systolic heart failure     echo 04/12/11: Mild LVH, EF 30-35%, mid to distal anteroseptal and apical hypokinesis, grade 2 diastolic dysfunction, moderate LAE, mild RAE.  [name] Hyperlipidemia   . Tobacco abuse   . CAD (coronary artery disease)     acute anterior STEMI, late presentation 04/06/11 LHC 3/5 demonstrated severe ostial left main 95% stenosis and otherwise three-vessel CAD with an ejection fraction of 15%.  Emergent CABG: Dr. Dorris Fetch - Grafts: LIMA-LAD, SVG-D1, SVG-OM1, SVG-PDA and PL.  . Ischemic cardiomyopathy   . DM2 (diabetes mellitus, type 2)   . 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 Acute renal insufficiency     05/2011 admission (cr 1.54 at discharge)  . Anemia     Post-CABG  . Arthritis   . Stroke   . Sleep apnea   . Neuromuscular disorder     diabetic neuropathy  . Diarrhea     has had c diff diarrhea  .  Weakness   . Bruises easily      Past Surgical History  Procedure Laterality Date  . Coronary artery bypass graft  04/06/2011    Procedure: CORONARY ARTERY BYPASS GRAFTING (CABG);  Surgeon: Loreli Slot, MD;  Location: [facility] OR;  Service: Open Heart Surgery;  Laterality: N/A;  Coronary Artery Bypass Graft times four utilizing the left internal mammary artery and the Right and left  greater Saphenous veins Harvested endoscopically.  . Fibroid bypass 04/06/11    . Colon surgery      rectal fissure  . Cervical disc surgery  1997 - approximate  . Colonoscopy  07/29/2011    Procedure: COLONOSCOPY;  Surgeon: Rachael Fee, MD;  Location: [facility] ENDOSCOPY;  Service: Endoscopy;  Laterality: N/A;  . Eye surgery  2011    cataract removal     Family  History  Problem Relation Age of Onset  . Heart attack Mother     ?57s  . Diabetes Mother   . Heart attack Sister     60s  . Diabetes Sister     Dietitian  . Congestive Heart Failure Father      History   Social History  . Marital Status: Single    Spouse Name: N/A    Number of Children: 0  . Years of Education: N/A   Occupational History  . Not on file.   Social History Main Topics  . Smoking status: Former Smoker -- 0.50 packs/day    Types: Cigarettes    Quit date: 04/06/2011  . Smokeless tobacco: Never Used  . Alcohol Use: No  . Drug Use: No  . Sexually Active: No   Other Topics Concern  . Not on file   Social History Narrative  . No narrative on file     ROS 10 point review of system was performed and is negative other than what is mentioned in history of present illness.    PHYSICAL EXAM   BP 126/72  Pulse 88  Ht 5' 8.5" (1.74 m)  Wt 192 lb 12.8 oz (87.454 kg)  BMI 28.89 kg/m2 Constitutional: He is oriented to person, place, and time. He appears well-developed and well-nourished. No distress.  HENT: No nasal discharge.  Head: Normocephalic and atraumatic.  Eyes: Pupils are equal and round. Right eye exhibits no discharge. Left eye exhibits no discharge.  Neck: Normal range of motion. Neck supple. No JVD present. No thyromegaly present.  Cardiovascular: Normal rate, regular rhythm, normal heart sounds and. Exam reveals no gallop and no friction rub. No murmur heard.  Pulmonary/Chest: Effort normal and breath sounds normal. No stridor. No respiratory distress. He has no wheezes. He has no rales. He exhibits no tenderness.  Abdominal: Soft. Bowel sounds are normal. He exhibits no distension. There is no tenderness. There is no rebound and no guarding.  Musculoskeletal: Normal range of motion. He exhibits mild edema and no tenderness.  Neurological: He is alert and oriented to person, place, and time. Coordination normal.  Skin: Skin is warm and dry. No  rash noted. He is not diaphoretic. No erythema. No pallor.  Psychiatric: He has a normal mood and affect. His behavior is normal. Judgment and thought content normal.  Vascular: Femoral pulses are diminished bilaterally. Distal pulses are not palpable.      ASSESSMENT AND PLAN

## 2012-07-28 NOTE — Assessment & Plan Note (Signed)
Kurt Richard has evidence of severe peripheral arterial disease with rest pain affecting the left calf. His ABI was 0.5 on the right side and 0.34 on the left side. He denies symptoms on the right side likely due to severity of the disease in the left side masking the right-sided disease. His left SFA was occluded. However, there was also evidence of inflow disease. I had a prolonged discussion with the patient about management options. Due to severity of his disease and rest pain, I recommend proceeding with lower extremity arterial angiography and possible endovascular intervention. Obviously, the main issue here is risk of contrast-induced nephropathy given his advanced chronic kidney disease.  the riskt of worsening renal function was discussed with him in details. I also discussed all other risks associated with the procedure.  We will proceed with angiography next week. I will plan on admitting him the night before for hydration. I will use CO2 angiography. The focus will be on the left leg with access from the right side. If we can at least improve his inflow disease, we might not have to treat the infrainguinal disease.

## 2012-08-01 ENCOUNTER — Observation Stay (HOSPITAL_COMMUNITY)
Admission: AD | Admit: 2012-08-01 | Discharge: 2012-08-02 | Disposition: A | Discharge status: Home / Self Care | Payer: Medicare Other | Source: Ambulatory Visit | Attending: Cardiovascular Disease | Admitting: Cardiovascular Disease

## 2012-08-01 ENCOUNTER — Encounter (HOSPITAL_COMMUNITY): Payer: Self-pay | Admitting: General Practice

## 2012-08-01 DIAGNOSIS — F17200 Nicotine dependence, unspecified, uncomplicated: Secondary | ICD-10-CM | POA: Insufficient documentation

## 2012-08-01 DIAGNOSIS — I2510 Atherosclerotic heart disease of native coronary artery without angina pectoris: Secondary | ICD-10-CM | POA: Insufficient documentation

## 2012-08-01 DIAGNOSIS — I252 Old myocardial infarction: Secondary | ICD-10-CM | POA: Insufficient documentation

## 2012-08-01 DIAGNOSIS — E119 Type 2 diabetes mellitus without complications: Secondary | ICD-10-CM | POA: Insufficient documentation

## 2012-08-01 DIAGNOSIS — Z8673 Personal history of transient ischemic attack (TIA), and cerebral infarction without residual deficits: Secondary | ICD-10-CM | POA: Insufficient documentation

## 2012-08-01 DIAGNOSIS — I129 Hypertensive chronic kidney disease with stage 1 through stage 4 chronic kidney disease, or unspecified chronic kidney disease: Secondary | ICD-10-CM | POA: Insufficient documentation

## 2012-08-01 DIAGNOSIS — Z7982 Long term (current) use of aspirin: Secondary | ICD-10-CM | POA: Insufficient documentation

## 2012-08-01 DIAGNOSIS — Z951 Presence of aortocoronary bypass graft: Secondary | ICD-10-CM | POA: Insufficient documentation

## 2012-08-01 DIAGNOSIS — I10 Essential (primary) hypertension: Secondary | ICD-10-CM

## 2012-08-01 DIAGNOSIS — E785 Hyperlipidemia, unspecified: Secondary | ICD-10-CM

## 2012-08-01 DIAGNOSIS — N189 Chronic kidney disease, unspecified: Secondary | ICD-10-CM

## 2012-08-01 DIAGNOSIS — I708 Atherosclerosis of other arteries: Secondary | ICD-10-CM | POA: Insufficient documentation

## 2012-08-01 DIAGNOSIS — I739 Peripheral vascular disease, unspecified: Secondary | ICD-10-CM

## 2012-08-01 DIAGNOSIS — N183 Chronic kidney disease, stage 3 unspecified: Secondary | ICD-10-CM | POA: Insufficient documentation

## 2012-08-01 DIAGNOSIS — I5022 Chronic systolic (congestive) heart failure: Secondary | ICD-10-CM | POA: Insufficient documentation

## 2012-08-01 DIAGNOSIS — I2589 Other forms of chronic ischemic heart disease: Secondary | ICD-10-CM | POA: Insufficient documentation

## 2012-08-01 DIAGNOSIS — I70219 Atherosclerosis of native arteries of extremities with intermittent claudication, unspecified extremity: Principal | ICD-10-CM | POA: Insufficient documentation

## 2012-08-01 DIAGNOSIS — N179 Acute kidney failure, unspecified: Secondary | ICD-10-CM

## 2012-08-01 DIAGNOSIS — Z79899 Other long term (current) drug therapy: Secondary | ICD-10-CM | POA: Insufficient documentation

## 2012-08-01 DIAGNOSIS — Z7902 Long term (current) use of antithrombotics/antiplatelets: Secondary | ICD-10-CM | POA: Insufficient documentation

## 2012-08-01 LAB — CBC WITH DIFFERENTIAL/PLATELET
Eosinophils Absolute: 0.4 10*3/uL (ref 0.0–0.7)
Hemoglobin: 9.8 g/dL — ABNORMAL LOW (ref 13.0–17.0)
Lymphocytes Relative: 11 % — ABNORMAL LOW (ref 12–46)
Lymphs Abs: 1 10*3/uL (ref 0.7–4.0)
MCH: 29.1 pg (ref 26.0–34.0)
Monocytes Relative: 7 % (ref 3–12)
Neutro Abs: 7.1 10*3/uL (ref 1.7–7.7)
Neutrophils Relative %: 77 % (ref 43–77)
Platelets: 262 10*3/uL (ref 150–400)
RBC: 3.37 MIL/uL — ABNORMAL LOW (ref 4.22–5.81)
WBC: 9.2 10*3/uL (ref 4.0–10.5)

## 2012-08-01 LAB — PROTIME-INR
INR: 1.08 (ref 0.00–1.49)
Prothrombin Time: 13.8 seconds (ref 11.6–15.2)

## 2012-08-01 LAB — BASIC METABOLIC PANEL
BUN: 51 mg/dL — ABNORMAL HIGH (ref 6–23)
Chloride: 106 mEq/L (ref 96–112)
GFR calc non Af Amer: 36 mL/min — ABNORMAL LOW (ref >90)
Glucose, Bld: 136 mg/dL — ABNORMAL HIGH (ref 70–99)
Potassium: 4.7 mEq/L (ref 3.5–5.1)
Sodium: 141 mEq/L (ref 135–145)

## 2012-08-01 MED ORDER — NITROGLYCERIN 0.4 MG SL SUBL: 0.4000 mg | SUBLINGUAL_TABLET | SUBLINGUAL | Status: DC | PRN

## 2012-08-01 MED ORDER — OXYCODONE HCL 5 MG PO TABS: 5.0000 mg | ORAL_TABLET | Freq: Four times a day (QID) | ORAL | Status: DC | PRN

## 2012-08-01 MED ORDER — ONDANSETRON HCL 4 MG/2ML IJ SOLN: 4.0000 mg | Freq: Four times a day (QID) | INTRAMUSCULAR | Status: DC | PRN

## 2012-08-01 MED ORDER — HEPARIN SODIUM (PORCINE) 5000 UNIT/ML IJ SOLN
5000.0000 [IU] | Freq: Three times a day (TID) | INTRAMUSCULAR | Status: DC
  Administered 2012-08-01 – 2012-08-02 (×2): 5000 [IU] via SUBCUTANEOUS
  Filled 2012-08-01 (×5): qty 1

## 2012-08-01 MED ORDER — LOPERAMIDE HCL 2 MG PO TABS: 2.0000 mg | ORAL_TABLET | Freq: Two times a day (BID) | ORAL | Status: DC | PRN

## 2012-08-01 MED ORDER — ASPIRIN EC 81 MG PO TBEC
81.0000 mg | DELAYED_RELEASE_TABLET | Freq: Every day | ORAL | Status: DC
  Administered 2012-08-01 – 2012-08-02 (×2): 81 mg via ORAL
  Filled 2012-08-01 (×2): qty 1

## 2012-08-01 MED ORDER — LOPERAMIDE HCL 2 MG PO CAPS: 2.0000 mg | ORAL_CAPSULE | Freq: Two times a day (BID) | ORAL | Status: DC | PRN

## 2012-08-01 MED ORDER — FLUTICASONE PROPIONATE 50 MCG/ACT NA SUSP
2.0000 | Freq: Every day | NASAL | Status: DC
  Administered 2012-08-01 – 2012-08-02 (×2): 2 via NASAL
  Filled 2012-08-01: qty 16

## 2012-08-01 MED ORDER — SODIUM BICARBONATE 650 MG PO TABS
650.0000 mg | ORAL_TABLET | Freq: Two times a day (BID) | ORAL | Status: DC
  Administered 2012-08-01 – 2012-08-02 (×2): 650 mg via ORAL
  Filled 2012-08-01 (×3): qty 1

## 2012-08-01 MED ORDER — ISOSORB DINITRATE-HYDRALAZINE 20-37.5 MG PO TABS
1.0000 | ORAL_TABLET | Freq: Two times a day (BID) | ORAL | Status: DC
  Administered 2012-08-01 – 2012-08-02 (×2): 1 via ORAL
  Filled 2012-08-01 (×3): qty 1

## 2012-08-01 MED ORDER — METHIMAZOLE 5 MG PO TABS
5.0000 mg | ORAL_TABLET | Freq: Two times a day (BID) | ORAL | Status: DC
  Administered 2012-08-01 – 2012-08-02 (×2): 5 mg via ORAL
  Filled 2012-08-01 (×3): qty 1

## 2012-08-01 MED ORDER — ACETAMINOPHEN 325 MG PO TABS: 650.0000 mg | ORAL_TABLET | ORAL | Status: DC | PRN

## 2012-08-01 MED ORDER — ASPIRIN EC 81 MG PO TBEC: 81.0000 mg | DELAYED_RELEASE_TABLET | Freq: Every day | ORAL | Status: DC

## 2012-08-01 MED ORDER — ZOLPIDEM TARTRATE 5 MG PO TABS: 5.0000 mg | ORAL_TABLET | Freq: Every evening | ORAL | Status: DC | PRN

## 2012-08-01 MED ORDER — CARVEDILOL 25 MG PO TABS
25.0000 mg | ORAL_TABLET | Freq: Two times a day (BID) | ORAL | Status: DC
  Administered 2012-08-01 – 2012-08-02 (×3): 25 mg via ORAL
  Filled 2012-08-01 (×4): qty 1

## 2012-08-01 NOTE — H&P (Signed)
Patient ID: Kurt Richard MRN: [id number], DOB/AGE: 04-Jun-1945   Admit date: 08/01/2012  Primary Physician: Kristian Covey, MD Primary Cardiologist: Katherina Right, MD / Judie Petit. Kirke Corin, MD (PV)  Pt. Profile:  67 y/o male with h/o CAD and claudication who presents this evening for elective admission in preparation for peripheral angio tomorrow.  Problem List  Past Medical History  Diagnosis Date  . Diabetes mellitus   . Hypertension   . Chronic systolic heart failure     echo 04/12/11: Mild LVH, EF 30-35%, mid to distal anteroseptal and apical hypokinesis, grade 2 diastolic dysfunction, moderate LAE, mild RAE.  [name] Hyperlipidemia   . Tobacco abuse   . CAD (coronary artery disease)     acute anterior STEMI, late presentation 04/06/11 LHC 3/5 demonstrated severe ostial left main 95% stenosis and otherwise three-vessel CAD with an ejection fraction of 15%.  Emergent CABG: Dr. Dorris Fetch - Grafts: LIMA-LAD, SVG-D1, SVG-OM1, SVG-PDA and PL.  . Ischemic cardiomyopathy   . DM2 (diabetes mellitus, type 2)   . 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 Acute renal insufficiency     05/2011 admission (cr 1.54 at discharge)  . Anemia     Post-CABG  . Arthritis   . Stroke   . Sleep apnea   . Neuromuscular disorder     diabetic neuropathy  . Diarrhea     has had c diff diarrhea  . Weakness   . Bruises easily     Past Surgical History  Procedure Laterality Date  . Coronary artery bypass graft  04/06/2011    Procedure: CORONARY ARTERY BYPASS GRAFTING (CABG);  Surgeon: Loreli Slot, MD;  Location: [facility] OR;  Service: Open Heart Surgery;  Laterality: N/A;  Coronary Artery Bypass Graft times four utilizing the left internal mammary artery and the Right and left  greater Saphenous veins Harvested endoscopically.  . Fibroid bypass 04/06/11    . Colon surgery      rectal fissure  . Cervical disc surgery  1997 - approximate  . Colonoscopy  07/29/2011    Procedure: COLONOSCOPY;  Surgeon: Rachael Fee, MD;  Location: [facility] ENDOSCOPY;  Service: Endoscopy;  Laterality: N/A;  . Eye surgery  2011    cataract removal    Allergies  Allergies  Allergen Reactions  . Diltiazem Hcl Hives and Swelling  . Nifedipine Hives and Swelling   HPI  This is a pleasant 67 year old man who was recently seen in clinic by Dr. Kirke Corin for evaluation and management of peripheral arterial disease. The patient has known history of coronary artery disease status post anterior MI in 2013. He is status post CABG. He had severe LV systolic dysfunction at that time with gradual improvement. Most recent ejection fraction was 35-40%. He has multiple chronic medical conditions including type 2 diabetes, hypertension, hyperlipidemia and chronic kidney disease. Most recent creatinine was 2.2. He suffers from chronic diarrhea related to previous C. difficile colitis. He has been experiencing severe symptoms of left calf discomfort which happens after walking only a few steps. He also gets rest pain in the morning after waking up. He feels cramping and stiffness even without walking. There is no lower extremity wounds or ulceration. He does not have significant symptoms on the right side.  ABI's were 0.5 on the right and 0.34 on the left.  He presents tonight for hydration in preparation for peripheral angiography tomorrow.  He currently has no complaints.  Home Medications  Prior to Admission medications  Medication Sig Start Date End Date Taking? Authorizing Provider  aspirin 325 MG EC tablet Take 325 mg by mouth daily. 04/16/11  Yes Wayne E Gold, PA-C  carvedilol (COREG) 25 MG tablet Take 25 mg by mouth 2 (two) times daily with a meal. 06/29/12  Yes [name], MD  diphenoxylate-atropine (LOMOTIL) 2.5-0.025 MG per tablet Take 1 tablet by mouth 4 (four) times daily as needed for diarrhea or loose stools. 05/24/12  Yes Rachael Fee, MD  fluticasone [facility]) 50 MCG/ACT nasal spray Place 2 sprays into the nose daily.    Yes Historical Provider, MD  furosemide (LASIX) 40 MG tablet Take 40 mg by mouth 3 (three) times a week. Take 40 mg on Mon., Wed., and Fri. 05/18/12  Yes [name], MD  isosorbide-hydrALAZINE (BIDIL) 20-37.5 MG per tablet Take 1 tablet by mouth 2 (two) times daily. 03/03/12  Yes Rosalio Macadamia, NP  loperamide (IMODIUM A-D) 2 MG tablet Take 2 mg by mouth 2 (two) times daily as needed for diarrhea or loose stools.    Yes Historical Provider, MD  methimazole (TAPAZOLE) 5 MG tablet Take 5 mg by mouth 2 (two) times daily. 02/16/12  Yes Kristian Covey, MD  nitroGLYCERIN (NITROSTAT) 0.4 MG SL tablet Place 0.4 mg under the tongue every 5 (five) minutes as needed for chest pain.   Yes Historical Provider, MD  oxyCODONE (OXY IR/ROXICODONE) 5 MG immediate release tablet Take 5 mg by mouth as needed for pain. Per Dr. Georgann Housekeeper 11/05/11  Yes Historical Provider, MD  paregoric 2 MG/5ML solution Take 5 mLs by mouth 4 (four) times daily as needed for diarrhea or loose stools.   Yes Historical Provider, MD  sodium bicarbonate 325 MG tablet Take 650 mg by mouth 2 (two) times daily.    Yes Historical Provider, MD  fluticasone (FLONASE) 50 MCG/ACT nasal spray Place 2 sprays into the nose daily as needed for allergies.  06/11/11 06/29/12  Kristian Covey, MD  nitroGLYCERIN (NITROSTAT) 0.4 MG SL tablet Place 0.4 mg under the tongue every 5 (five) minutes as needed for chest pain.  04/23/11 06/29/12  [name], MD   Family History  Family History  Problem Relation Age of Onset  . Heart attack Mother     ?30s  . Diabetes Mother   . Heart attack Sister     3s  . Diabetes Sister     Dietitian  . Congestive Heart Failure Father    Social History  History   Social History  . Marital Status: Single    Spouse Name: N/A    Number of Children: 0  . Years of Education: N/A   Occupational History  . Not on file.   Social History Main Topics  . Smoking status: Former Smoker -- 0.50 packs/day    Types:  Cigarettes    Quit date: 04/06/2011  . Smokeless tobacco: Never Used  . Alcohol Use: No  . Drug Use: No  . Sexually Active: No   Other Topics Concern  . Not on file   Social History Narrative  . No narrative on file    Review of Systems General:  No chills, fever, night sweats or weight changes.  Cardiovascular:  No chest pain, dyspnea on exertion, edema, orthopnea, palpitations, paroxysmal nocturnal dyspnea. PV:  L > R bilat LE claudication. Dermatological: No rash, lesions/masses Respiratory: No cough, dyspnea Urologic: No hematuria, dysuria Abdominal:   No nausea, vomiting, diarrhea, bright red blood per rectum, melena,  or hematemesis Neurologic:  No visual changes, wkns, changes in mental status. All other systems reviewed and are otherwise negative except as noted above.  Physical Exam  Blood pressure 157/83, pulse 81, temperature 98.5 F (36.9 C), temperature source Oral, resp. rate 18, height 5\' 8"  (1.727 m), weight 179 lb 14.3 oz (81.6 kg), SpO2 98.00%.  General: Pleasant, NAD Psych: Normal affect. Neuro: Alert and oriented X 3. Moves all extremities spontaneously. HEENT: Normal  Neck: Supple without bruits or JVD. Lungs:  Resp regular and unlabored, CTA. Heart: RRR no s3, s4, or murmurs. Abdomen: Soft, non-tender, non-distended, BS + x 4.  Extremities: No clubbing, cyanosis.  Trace bilat LE edema. Weak distal pulses bilat.    Labs  Pending  Radiology/Studies  No results found.  ECG  Pending  ASSESSMENT AND PLAN  1.  PVD/Claudication:  Recently seen in clinic by Dr. Kirke Corin.  Abnl ABI's with 0.5 on the right and 0.34 on the left.  Due to h/o CKD, he presents this evening for hydration in preparation for peripheral angiography tomorrow.  2.  CAD:  Stable.  Cont home meds.  3.  ICM/Chronic systolic CAD:  Hold lasix.  Cont bb, hydral/nitrate.  Euvolemic on exam.  4.  HTN:  bp currently elevated.  Follow and adjust meds as necessary.  5.  CKD:  F/u creat  tonight and in AM.  Signed, Nicolasa Ducking, NP 08/01/2012, 5:51 PM

## 2012-08-01 NOTE — H&P (Signed)
Significant claudication LLE worse than right.  Distal pulses hard to palpate.  Previous bypass with low EF.  Cr 1.54 with diabetes.  On HCO3 at home Admitted for mild hydration NS75cc/hour overnight before PV procedure due to symptomatic LLE calf claudication.  Pulses below knees hard to palpate bilaterally.  Previous stenotomy and enlarged PMI otherwise exam unremarkable  Charlton Haws

## 2012-08-01 NOTE — Progress Notes (Signed)
Pt arrived to 2027 at approx.1656  via wheelchair with admitting personnel, partner at bedside. Pt direct admit. No signs/symptoms of pain or distress noted. VSS. Pt placed on monitor, admitting doctor notified by Selena Batten, RN (charge). No orders at this time. Assessment complete. Bed low, call light within reach, pt oriented to the unit. Will continue to monitor.  -Alwyn Ren, RN

## 2012-08-02 ENCOUNTER — Encounter (HOSPITAL_COMMUNITY)
Admission: AD | Disposition: A | Discharge status: Home / Self Care | Payer: Self-pay | Source: Ambulatory Visit | Attending: Cardiovascular Disease

## 2012-08-02 DIAGNOSIS — N179 Acute kidney failure, unspecified: Secondary | ICD-10-CM

## 2012-08-02 DIAGNOSIS — N189 Chronic kidney disease, unspecified: Secondary | ICD-10-CM

## 2012-08-02 DIAGNOSIS — I739 Peripheral vascular disease, unspecified: Secondary | ICD-10-CM

## 2012-08-02 DIAGNOSIS — I70219 Atherosclerosis of native arteries of extremities with intermittent claudication, unspecified extremity: Secondary | ICD-10-CM

## 2012-08-02 DIAGNOSIS — I70229 Atherosclerosis of native arteries of extremities with rest pain, unspecified extremity: Secondary | ICD-10-CM

## 2012-08-02 HISTORY — PX: LOWER EXTREMITY ANGIOGRAM: SHX5508

## 2012-08-02 HISTORY — PX: ABDOMINAL AORTAGRAM: SHX5454

## 2012-08-02 HISTORY — PX: PERCUTANEOUS STENT INTERVENTION: SHX5500

## 2012-08-02 LAB — BASIC METABOLIC PANEL
BUN: 53 mg/dL — ABNORMAL HIGH (ref 6–23)
CO2: 22 mEq/L (ref 19–32)
Chloride: 107 mEq/L (ref 96–112)
GFR calc Af Amer: 36 mL/min — ABNORMAL LOW (ref >90)
Potassium: 4.6 mEq/L (ref 3.5–5.1)

## 2012-08-02 LAB — POCT ACTIVATED CLOTTING TIME
Activated Clotting Time: 191 seconds
Activated Clotting Time: 222 seconds

## 2012-08-02 LAB — GLUCOSE, CAPILLARY: Glucose-Capillary: 145 mg/dL — ABNORMAL HIGH (ref 70–99)

## 2012-08-02 SURGERY — ABDOMINAL AORTAGRAM

## 2012-08-02 MED ORDER — LIDOCAINE HCL (PF) 1 % IJ SOLN
INTRAMUSCULAR | Status: AC
  Filled 2012-08-02: qty 30

## 2012-08-02 MED ORDER — MIDAZOLAM HCL 2 MG/2ML IJ SOLN
INTRAMUSCULAR | Status: AC
  Filled 2012-08-02: qty 2

## 2012-08-02 MED ORDER — SODIUM CHLORIDE 0.9 % IV SOLN: INTRAVENOUS | Status: AC

## 2012-08-02 MED ORDER — SODIUM CHLORIDE 0.9 % IJ SOLN: 3.0000 mL | INTRAMUSCULAR | Status: DC | PRN

## 2012-08-02 MED ORDER — SODIUM CHLORIDE 0.9 % IJ SOLN: 3.0000 mL | Freq: Two times a day (BID) | INTRAMUSCULAR | Status: DC

## 2012-08-02 MED ORDER — CLOPIDOGREL BISULFATE 75 MG PO TABS
75.0000 mg | ORAL_TABLET | Freq: Every day | ORAL | Status: DC
  Filled 2012-08-02: qty 1

## 2012-08-02 MED ORDER — CLOPIDOGREL BISULFATE 75 MG PO TABS: 75.0000 mg | ORAL_TABLET | Freq: Every day | ORAL | Status: DC

## 2012-08-02 MED ORDER — ASPIRIN 81 MG PO TBEC: 81.0000 mg | DELAYED_RELEASE_TABLET | Freq: Every day | ORAL | Status: AC

## 2012-08-02 MED ORDER — ASPIRIN 81 MG PO CHEW
324.0000 mg | CHEWABLE_TABLET | ORAL | Status: DC
  Filled 2012-08-02: qty 4

## 2012-08-02 MED ORDER — FENTANYL CITRATE 0.05 MG/ML IJ SOLN
INTRAMUSCULAR | Status: AC
  Filled 2012-08-02: qty 2

## 2012-08-02 MED ORDER — CLOPIDOGREL BISULFATE 300 MG PO TABS
ORAL_TABLET | ORAL | Status: AC
  Filled 2012-08-02: qty 2

## 2012-08-02 MED ORDER — SODIUM CHLORIDE 0.9 % IV SOLN: 250.0000 mL | INTRAVENOUS | Status: DC | PRN

## 2012-08-02 MED ORDER — SODIUM CHLORIDE 0.9 % IV SOLN
INTRAVENOUS | Status: DC
  Administered 2012-08-02: via INTRAVENOUS

## 2012-08-02 MED ORDER — HEPARIN SODIUM (PORCINE) 1000 UNIT/ML IJ SOLN
INTRAMUSCULAR | Status: AC
  Filled 2012-08-02: qty 1

## 2012-08-02 NOTE — Progress Notes (Signed)
Pt ambulated 350 feet with RN without any difficulty; pt back to room to sit on side of bed; R groin benign; will cont. To monitor.

## 2012-08-02 NOTE — CV Procedure (Signed)
PERIPHERAL VASCULAR PROCEDURE  NAME:  Kurt Richard   MRN: [id number] DOB:  September 08, 1945   ADMIT DATE: 08/01/2012  Performing Cardiologist: Lorine Bears Primary Physician: Kristian Covey, MD Primary Cardiologist:  Dr. Elease Hashimoto   Procedures Performed:  Abdominal Aortic Angiogram / bilateral iliac angiography using CO2.  Selective left lower extremity arterial angiography with runoff.  Selective right lower extremity arterial angiography with runoff.  Self-expanding stent placement to the left external iliac artery.    Indication(s):   Peripheral arterial disease with severe claudication and rest pain.   Consent: The procedure with Risks/Benefits/Alternatives and Indications was reviewed with the patient .  All questions were answered. The patient was brought to the hospital last night for hydration given his advanced chronic kidney disease.  Medications:  Sedation:  1 mg IV Versed, 75 mcg IV Fentanyl  Contrast:  38 mL  Visipaque   Procedural details: The right groin was prepped, draped, and anesthetized with 1% lidocaine. There was difficulty in accessing the artery due to calcifications. I used a micropuncture needle and sheath which was then exchanged into a 5 French sheath into the right common femoral artery.  A 5 Fr Short Pigtail Catheter was advanced of over a  Versicore wire into the descending Aorta to a level just above the renal arteries. Manual injection with CO2 was performed for Abdominal Aortic Angiography.  The catheter was then pulled back to a level just above the Aortic bifurcation, and a second manual injection was performed to evaluate the iliac arteries.  At this point I proceeded with stent placement to the left external iliac artery. After intervention, selective left lower extremity arterial angiography, was performed with 15 mL of contrast at the rate of 5 mL per second. The sheath was pulled back manually over the wire into the right external iliac  artery. Right lower extremity arterial angiography with runoff was performed through the sheath at the same rate. The sheath was secured in place to be removed manually.    Interventional Procedure:  The pigtail catheter was changed over the Versicore wire for A crossover catheter which was then pulled back to the aortic bifurcation. I could not advance this wire. Thus, I used a long glide advantage while which was advanced to the left common femoral artery. The catheter and sheath were removed. I placed a 45 cm 6 [place] destination sheath with in the distal left common iliac artery. 5000 units of unfractionated heparin was given with an ACT of 220. 600 mg of Plavix was given.  The lesion was crossed relatively easily with a Glidewire. I then placed a 9 x 30 mm Smart self-expanding stent. This was post dilated with an 8 x 20 mm balloon to 6 atmospheres with excellent results.    Hemodynamics:  Central Aortic Pressure / Mean Aortic Pressure: 176/72   Findings:  Abdominal aorta: Normal in size with no evidence of aneurysm. There is mild nonobstructive disease distally.   Left renal artery: Normal   Right renal artery: Normal   Celiac artery: Seems to be patent   Superior mesenteric artery: Patent   Right common iliac artery: Minor irregularities proximally with diffuse percent disease distally.   Right internal iliac artery: Occluded at the ostium.   Right external iliac artery: Diffuse 50-60% disease throughout its course.   Right common femoral artery: 20% disease.   Right profunda femoral artery: Diffusely diseased proximally.   Right superficial femoral artery: Diffusely diseased in the proximal and midsegment with  70-80% disease. Distally, there is a focal area of 90% stenosis.   Right popliteal artery: Proximally there is 90% stenosis. Minor irregularities in the rest of the segment.   There seems to be three-vessel runoff below the knee but they were not well-visualized.    Left common iliac artery:  Mild nonobstructive disease.   Left internal iliac artery: Occluded proximally with a small area of an aneurysmal segment.   Left external iliac artery: 90% stenosis in the distal segment.   Left common femoral artery: 30% diffuse disease.   Left profunda femoral artery: Diffuse 50% disease proximally but provides extensive collaterals to the SFA.   Left superficial femoral artery:  Severe diffuse disease proximally followed by long occlusion  With reconstitution distally via collaterals from the profunda.   Left popliteal artery: Diffuse 30% disease.   Three-vessel runoff below the knee with diffuse disease proximally.    Conclusions: 1. Severe focal stenosis in the distal left external iliac artery. Moderate to significant diffuse disease in the distal right common iliac artery and right external iliac artery.  2. Occluded left SFA in a long segment with collaterals from the profunda. Three-vessel runoff below the knee.  3. Diffusely diseased right SFA throughout its course with three-vessel runoff below the knee. 4. Successful self-expanding stent placement to the left external iliac artery.  Recommendations:  Recommend Plavix for at least one month and preferably longer given his extensive PAD. Only 38 mL of contrast was used. Hydrate for 6 hours and discharged home later today. The SFA disease will be treated medically.   Lorine Bears, MD, [facility] 08/02/2012 11:02 AM

## 2012-08-02 NOTE — Progress Notes (Signed)
Received from cath lab. Assessed per flow sheet. Denies chest pain or shortness of breath. Right groin dressing dry/intact with no bleeding or hematoma noted. Post activity and precautions explained. Patient verbalized understanding. VSS. Call bell near. Kurt Richard

## 2012-08-02 NOTE — Interval H&P Note (Signed)
History and Physical Interval Note:  08/02/2012 9:41 AM  Kurt Richard  has presented today for surgery, with the diagnosis of pvd  The various methods of treatment have been discussed with the patient and family. After consideration of risks, benefits and other options for treatment, the patient has consented to  Procedure(s): ABDOMINAL AORTAGRAM (N/A) as a surgical intervention .  The patient's history has been reviewed, patient examined, no change in status, stable for surgery.  I have reviewed the patient's chart and labs.  Questions were answered to the patient's satisfaction.     Lorine Bears

## 2012-08-02 NOTE — Progress Notes (Signed)
Discharge instructions along with med list provided. IV d/c'd with catheter intact. Right groin dressing remains unchanged. Awaiting transport to lobby for d/c home. Mamie Levers

## 2012-08-02 NOTE — Discharge Summary (Signed)
CARDIOLOGY DISCHARGE SUMMARY   Patient ID: Kurt Richard MRN: [id number] DOB/AGE: [birth year] 67 y.o.  Admit date: 08/01/2012 Discharge date: 08/03/2012  Primary Discharge Diagnosis:   PAD (peripheral artery disease) Secondary Discharge Diagnosis:    CKD (chronic kidney disease) stage 3, GFR 30-59 ml/min  Procedures Performed:  Abdominal Aortic Angiogram / bilateral iliac angiography using CO2.  Selective left lower extremity arterial angiography with runoff.  Selective right lower extremity arterial angiography with runoff.  Self-expanding stent placement to the left external iliac artery.  Hospital Course: Kurt Richard is a 67 y.o. male with a history of CAD who was found to have decreased ABIs indicating severe peripheral vascular disease. He has significant chronic renal insufficiency. A lower extremity angiogram was scheduled and he was admitted the night before for hydration.  He was hydrated and his renal function was rechecked prior to the procedure. It showed a creatinine that was slightly higher than normal but Dr. Kirke Corin recommended continuing hydration and close followup after the procedure.  Kurt Richard had the procedures described above. Results are listed below. Only 38 mL of Visipaque was used for the entire procedure. He tolerated it well. An excellent result was obtained.  Post procedure, Kurt Richard was hydrated for 6 hours. His cath site was checked and was without hematoma. He was seen [facility] without chest pain or shortness of breath and considered stable for discharge, to follow up as an outpatient.  Labs:   Lab Results  Component Value Date   WBC 9.2 08/01/2012   HGB 9.8* 08/01/2012   HCT 29.5* 08/01/2012   MCV 87.5 08/01/2012   PLT 262 08/01/2012     Recent Labs Lab 08/02/12 0629  NA 142  K 4.6  CL 107  CO2 22  BUN 53*  CREATININE 2.13*  CALCIUM 8.3*  GLUCOSE 145*    Recent Labs  08/01/12 1859  INR 1.08      Radiology: No results  found.  Peripheral vascular Cath: 08/02/2012 Findings:  Abdominal aorta: Normal in size with no evidence of aneurysm. There is mild nonobstructive disease distally.  Left renal artery: Normal  Right renal artery: Normal  Celiac artery: Seems to be patent  Superior mesenteric artery: Patent  Right common iliac artery: Minor irregularities proximally with diffuse percent disease distally.  Right internal iliac artery: Occluded at the ostium.  Right external iliac artery: Diffuse 50-60% disease throughout its course.  Right common femoral artery: 20% disease.  Right profunda femoral artery: Diffusely diseased proximally.  Right superficial femoral artery: Diffusely diseased in the proximal and midsegment with 70-80% disease. Distally, there is a focal area of 90% stenosis.  Right popliteal artery: Proximally there is 90% stenosis. Minor irregularities in the rest of the segment.  There seems to be three-vessel runoff below the knee but they were not well-visualized.  Left common iliac artery: Mild nonobstructive disease.  Left internal iliac artery: Occluded proximally with a small area of an aneurysmal segment.  Left external iliac artery: 90% stenosis in the distal segment.  Left common femoral artery: 30% diffuse disease.  Left profunda femoral artery: Diffuse 50% disease proximally but provides extensive collaterals to the SFA.  Left superficial femoral artery: Severe diffuse disease proximally followed by long occlusion With reconstitution distally via collaterals from the profunda.  Left popliteal artery: Diffuse 30% disease.  Three-vessel runoff below the knee with diffuse disease proximally.  9 x 30 mm Smart self-expanding stent to the left external iliac artery Conclusions:  1. Severe  focal stenosis in the distal left external iliac artery. Moderate to significant diffuse disease in the distal right common iliac artery and right external iliac artery.  2. Occluded left SFA in a long  segment with collaterals from the profunda. Three-vessel runoff below the knee.  3. Diffusely diseased right SFA throughout its course with three-vessel runoff below the knee.  4. Successful self-expanding stent placement to the left external iliac artery.  Recommendations:  Recommend Plavix for at least one month and preferably longer given his extensive PAD. Only 38 mL of contrast was used. Hydrate for 6 hours and discharged home later today. The SFA disease will be treated medically.  FOLLOW UP PLANS AND APPOINTMENTS Allergies  Allergen Reactions  . Diltiazem Hcl Hives and Swelling  . Nifedipine Hives and Swelling     Medication List         aspirin 81 MG EC tablet  Take 1 tablet (81 mg total) by mouth daily.     carvedilol 25 MG tablet  Commonly known as:  COREG  Take 25 mg by mouth 2 (two) times daily with a meal.     clopidogrel 75 MG tablet  Commonly known as:  PLAVIX  Take 1 tablet (75 mg total) by mouth daily.     diphenoxylate-atropine 2.5-0.025 MG per tablet  Commonly known as:  LOMOTIL  Take 1 tablet by mouth 4 (four) times daily as needed for diarrhea or loose stools.     fluticasone 50 MCG/ACT nasal spray  Commonly known as:  FLONASE  Place 2 sprays into the nose daily.     furosemide 40 MG tablet  Commonly known as:  LASIX  Take 40 mg by mouth 3 (three) times a week. Take 40 mg on Mon., Wed., and Fri.     isosorbide-hydrALAZINE 20-37.5 MG per tablet  Commonly known as:  BIDIL  Take 1 tablet by mouth 2 (two) times daily.     loperamide 2 MG tablet  Commonly known as:  IMODIUM A-D  Take 2 mg by mouth 2 (two) times daily as needed for diarrhea or loose stools.     methimazole 5 MG tablet  Commonly known as:  TAPAZOLE  Take 5 mg by mouth 2 (two) times daily.     nitroGLYCERIN 0.4 MG SL tablet  Commonly known as:  NITROSTAT  Place 0.4 mg under the tongue every 5 (five) minutes as needed for chest pain.     oxyCODONE 5 MG immediate release tablet   Commonly known as:  Oxy IR/ROXICODONE  Take 5 mg by mouth as needed for pain. Per Dr. Georgann Housekeeper     paregoric 2 MG/5ML solution  Take 5 mLs by mouth 4 (four) times daily as needed for diarrhea or loose stools.     sodium bicarbonate 325 MG tablet  Take 650 mg by mouth 2 (two) times daily.        Discharge Orders   Future Appointments Provider Department Dept Phone   08/09/2012 8:10 AM [facility] Main Office [place]) [phone]   08/22/2012 8:15 AM Iran Ouch, MD [facility] Main Office [place]) [phone]   10/06/2012 9:30 AM [name], MD [facility] Main Office [place]) [phone]   10/06/2012 9:45 AM [facility] Main Office [place]) [phone]   Future Orders Complete By Expires     Diet - low sodium heart healthy  As directed     Increase activity slowly  As directed  Follow-up Information   Follow up with [facility] On 08/09/2012. (at 8:10 am)    Contact information:   [address] [place] [address]       Follow up with Lorine Bears, MD On 08/22/2012. (At 8:15 AM)    Contact information:   [address] [place], [address] [phone]       BRING ALL MEDICATIONS WITH YOU TO FOLLOW UP APPOINTMENTS  Time spent with patient to include physician time: 31 min Signed: Theodore Demark, PA-C 08/03/2012, 9:39 AM Co-Sign MD

## 2012-08-02 NOTE — Progress Notes (Signed)
Small "dime-size" area of serousangunous drainage noted to right groin. No active bleeding noted. Right groin soft with no hematoma palpable. Site marked. Will continue to monitor. Patient re-instructed on precautions. VSS. Call bell near.Kurt Richard

## 2012-08-02 NOTE — Progress Notes (Signed)
UR Chart Review Completed  

## 2012-08-02 NOTE — Progress Notes (Signed)
Patient resting quietly in bed. Right groin remains unchanged. No bleeding or hematoma noted. VSS. Call bell near. Mamie Levers

## 2012-08-08 ENCOUNTER — Other Ambulatory Visit (HOSPITAL_COMMUNITY): Payer: Self-pay

## 2012-08-09 ENCOUNTER — Other Ambulatory Visit (INDEPENDENT_AMBULATORY_CARE_PROVIDER_SITE_OTHER): Payer: Medicare Other

## 2012-08-09 ENCOUNTER — Encounter (HOSPITAL_COMMUNITY)
Admission: RE | Admit: 2012-08-09 | Discharge: 2012-08-09 | Disposition: A | Discharge status: Home / Self Care | Payer: Medicare Other | Source: Ambulatory Visit | Attending: Nephrology | Admitting: Nephrology

## 2012-08-09 DIAGNOSIS — D649 Anemia, unspecified: Secondary | ICD-10-CM | POA: Insufficient documentation

## 2012-08-09 DIAGNOSIS — I739 Peripheral vascular disease, unspecified: Secondary | ICD-10-CM

## 2012-08-09 DIAGNOSIS — I10 Essential (primary) hypertension: Secondary | ICD-10-CM

## 2012-08-09 LAB — BASIC METABOLIC PANEL
CO2: 25 mEq/L (ref 19–32)
Calcium: 8.6 mg/dL (ref 8.4–10.5)
GFR: 30.6 mL/min — ABNORMAL LOW (ref >60.00)
Sodium: 142 mEq/L (ref 135–145)

## 2012-08-09 MED ORDER — EPOETIN ALFA 10000 UNIT/ML IJ SOLN
INTRAMUSCULAR | Status: AC
  Administered 2012-08-09: 3000 [IU]
  Filled 2012-08-09: qty 1

## 2012-08-09 MED ORDER — EPOETIN ALFA 3000 UNIT/ML IJ SOLN
3000.0000 [IU] | INTRAMUSCULAR | Status: DC
  Filled 2012-08-09 (×2): qty 1

## 2012-08-09 MED ORDER — SODIUM CHLORIDE 0.9 % IV SOLN
INTRAVENOUS | Status: DC
  Administered 2012-08-09: 12:00:00 via INTRAVENOUS

## 2012-08-09 MED ORDER — FERUMOXYTOL INJECTION 510 MG/17 ML
510.0000 mg | INTRAVENOUS | Status: DC
  Administered 2012-08-09: 510 mg via INTRAVENOUS

## 2012-08-10 LAB — POCT HEMOGLOBIN-HEMACUE: Hemoglobin: 9 g/dL — ABNORMAL LOW (ref 13.0–17.0)

## 2012-08-16 ENCOUNTER — Encounter (HOSPITAL_COMMUNITY)
Admission: RE | Admit: 2012-08-16 | Discharge: 2012-08-16 | Disposition: A | Discharge status: Home / Self Care | Payer: Medicare Other | Source: Ambulatory Visit | Attending: Nephrology | Admitting: Nephrology

## 2012-08-16 LAB — POCT HEMOGLOBIN-HEMACUE: Hemoglobin: 9.8 g/dL — ABNORMAL LOW (ref 13.0–17.0)

## 2012-08-16 MED ORDER — SODIUM CHLORIDE 0.9 % IV SOLN: INTRAVENOUS | Status: DC

## 2012-08-16 MED ORDER — EPOETIN ALFA 3000 UNIT/ML IJ SOLN
3000.0000 [IU] | INTRAMUSCULAR | Status: DC
  Filled 2012-08-16: qty 1

## 2012-08-16 MED ORDER — FERUMOXYTOL INJECTION 510 MG/17 ML
INTRAVENOUS | Status: AC
  Administered 2012-08-16: 510 mg via INTRAVENOUS
  Filled 2012-08-16: qty 17

## 2012-08-16 MED ORDER — FERUMOXYTOL INJECTION 510 MG/17 ML: 510.0000 mg | INTRAVENOUS | Status: AC

## 2012-08-16 MED ORDER — EPOETIN ALFA 10000 UNIT/ML IJ SOLN
INTRAMUSCULAR | Status: AC
  Administered 2012-08-16: 10000 [IU] via SUBCUTANEOUS
  Filled 2012-08-16: qty 1

## 2012-08-17 ENCOUNTER — Other Ambulatory Visit: Payer: Self-pay | Admitting: Family Medicine

## 2012-08-17 NOTE — Telephone Encounter (Signed)
Pt was last seen on 02/14

## 2012-08-17 NOTE — Telephone Encounter (Signed)
Refill times three. 

## 2012-08-22 ENCOUNTER — Encounter: Payer: Self-pay | Admitting: Cardiovascular Disease

## 2012-08-22 ENCOUNTER — Encounter (INDEPENDENT_AMBULATORY_CARE_PROVIDER_SITE_OTHER): Payer: Medicare Other

## 2012-08-22 ENCOUNTER — Ambulatory Visit (INDEPENDENT_AMBULATORY_CARE_PROVIDER_SITE_OTHER): Payer: Medicare Other | Admitting: Cardiovascular Disease

## 2012-08-22 VITALS — BP 156/82 | HR 78 | Ht 68.5 in | Wt 193.0 lb

## 2012-08-22 DIAGNOSIS — I739 Peripheral vascular disease, unspecified: Secondary | ICD-10-CM

## 2012-08-22 NOTE — Patient Instructions (Addendum)
Your physician has recommended you make the following change in your medication: DECREASE Aspirin to 81mg  daily  Your physician has requested that you have an ankle brachial index (ABI) NOW. During this test an ultrasound and blood pressure cuff are used to evaluate the arteries that supply the arms and legs with blood. Allow thirty minutes for this exam. There are no restrictions or special instructions.  Your physician has requested that you have an aorto-iliac duplex in 3 MONTHS. During this test, an ultrasound is used to evaluate the aorta and iliac arteries.  Do not eat after midnight the day before and avoid carbonated beverages  Your physician wants you to follow-up in: 4 MONTHS with Dr Kirke Corin.  You will receive a reminder letter in the mail two months in advance. If you don't receive a letter, please call our office to schedule the follow-up appointment.

## 2012-08-22 NOTE — Progress Notes (Signed)
Primary cardiologist: Dr. Elease Hashimoto  HPI  This is a pleasant 67 year old man who is here today for a followup visit regarding peripheral arterial disease. The patient has known history of coronary artery disease status post anterior MI in 2013. He is status post CABG. He had severe LV systolic dysfunction at that time with gradual improvement. Most recent ejection fraction was 35-40%. He has multiple chronic medical conditions including type 2 diabetes, hypertension, hyperlipidemia and chronic kidney disease. Most recent creatinine was 2.2. He suffers from chronic area related to previous C. difficile colitis. He was seen recently for severe claudication involving left calf discomfort with rest pain. His ABI was severely reduced on the left and moderately reduced on the right. I performed your extremity angiography which showed: Right: 90% focal stenosis in external iliac artery, long occlusion of SFA with 3 vessel run off . s/p slef expanding stent placement to external iliac artery. Left: borderline significant disease in comon and external iliac arteries. Diffuse 70-90% in SFA with 3 vessel run off.  He reports significant improvement in left calf discomfort and resolution of rest pain. He continues to have mild claudication likely due to his SFA disease.  Allergies  Allergen Reactions  . Diltiazem Hcl Hives and Swelling  . Nifedipine Hives and Swelling     Current Outpatient Prescriptions on File Prior to Visit  Medication Sig Dispense Refill  . aspirin 81 MG EC tablet Take 1 tablet (81 mg total) by mouth daily.      . carvedilol (COREG) 25 MG tablet Take 25 mg by mouth 2 (two) times daily with a meal.      . clopidogrel (PLAVIX) 75 MG tablet Take 1 tablet (75 mg total) by mouth daily.  30 tablet  11  . diphenoxylate-atropine (LOMOTIL) 2.5-0.025 MG per tablet TAKE 1 TABLET 4 TIMES A DAY AS NEEDED FOR DIARRHEA OR LOOSE STOOLS  30 tablet  3  . fluticasone (FLONASE) 50 MCG/ACT nasal spray Place  2 sprays into the nose daily.      . furosemide (LASIX) 40 MG tablet Take 40 mg by mouth 3 (three) times a week. Take 40 mg on Mon., Wed., and Fri.      . isosorbide-hydrALAZINE (BIDIL) 20-37.5 MG per tablet Take 1 tablet by mouth 2 (two) times daily.      [name] loperamide (IMODIUM A-D) 2 MG tablet Take 2 mg by mouth 2 (two) times daily as needed for diarrhea or loose stools.       . methimazole (TAPAZOLE) 5 MG tablet Take 5 mg by mouth 2 (two) times daily.      . nitroGLYCERIN (NITROSTAT) 0.4 MG SL tablet Place 0.4 mg under the tongue every 5 (five) minutes as needed for chest pain.      [name] oxyCODONE (OXY IR/ROXICODONE) 5 MG immediate release tablet Take 5 mg by mouth as needed for pain. Per Dr. Georgann Housekeeper      . paregoric 2 MG/5ML solution Take 5 mLs by mouth 4 (four) times daily as needed for diarrhea or loose stools.      . sodium bicarbonate 325 MG tablet Take 650 mg by mouth 2 (two) times daily.        No current facility-administered medications on file prior to visit.     Past Medical History  Diagnosis Date  . Diabetes mellitus   . Hypertension   . Chronic systolic heart failure     echo 04/12/11: Mild LVH, EF 30-35%, mid to distal anteroseptal and apical  hypokinesis, grade 2 diastolic dysfunction, moderate LAE, mild RAE.  [name] Hyperlipidemia   . Tobacco abuse   . CAD (coronary artery disease)     acute anterior STEMI, late presentation 04/06/11 [facility] 3/5 demonstrated severe ostial left main 95% stenosis and otherwise three-vessel CAD with an ejection fraction of 15%.  Emergent CABG: Dr. Dorris Fetch - Grafts: LIMA-LAD, SVG-D1, SVG-OM1, SVG-PDA and PL.  . Ischemic cardiomyopathy   . DM2 (diabetes mellitus, type 2)   . 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 Acute renal insufficiency     05/2011 admission (cr 1.54 at discharge)  . Anemia     Post-CABG  . Arthritis   . Stroke   . Sleep apnea   . Neuromuscular disorder     diabetic neuropathy  . Diarrhea     has had c diff diarrhea  .  Weakness   . Bruises easily      Past Surgical History  Procedure Laterality Date  . Coronary artery bypass graft  04/06/2011    Procedure: CORONARY ARTERY BYPASS GRAFTING (CABG);  Surgeon: Loreli Slot, MD;  [facility];  Service: Open Heart Surgery;  Laterality: N/A;  Coronary Artery Bypass Graft times four utilizing the left internal mammary artery and the Right and left  greater Saphenous veins Harvested endoscopically.  . Fibroid bypass 04/06/11    . Colon surgery      rectal fissure  . Cervical disc surgery  1997 - approximate  . Colonoscopy  07/29/2011    Procedure: COLONOSCOPY;  Surgeon: Rachael Fee, MD;  Location: [facility] ENDOSCOPY;  Service: Endoscopy;  Laterality: N/A;  . Eye surgery  2011    cataract removal     Family History  Problem Relation Age of Onset  . Heart attack Mother     ?43s  . Diabetes Mother   . Heart attack Sister     67s  . Diabetes Sister     Dietitian  . Congestive Heart Failure Father      History   Social History  . Marital Status: Single    Spouse Name: N/A    Number of Children: 0  . Years of Education: N/A   Occupational History  . Not on file.   Social History Main Topics  . Smoking status: Former Smoker -- 0.50 packs/day    Types: Cigarettes    Quit date: 04/06/2011  . Smokeless tobacco: Never Used  . Alcohol Use: No  . Drug Use: No  . Sexually Active: No   Other Topics Concern  . Not on file   Social History Narrative  . No narrative on file       PHYSICAL EXAM   BP 156/82  Pulse 78  Ht 5' 8.5" (1.74 m)  Wt 193 lb (87.544 kg)  BMI 28.92 kg/m2 Constitutional: He is oriented to person, place, and time. He appears well-developed and well-nourished. No distress.  HENT: No nasal discharge.  Head: Normocephalic and atraumatic.  Eyes: Pupils are equal and round. Right eye exhibits no discharge. Left eye exhibits no discharge.  Neck: Normal range of motion. Neck supple. No JVD present. No thyromegaly  present.  Cardiovascular: Normal rate, regular rhythm, normal heart sounds and. Exam reveals no gallop and no friction rub. No murmur heard.  Pulmonary/Chest: Effort normal and breath sounds normal. No stridor. No respiratory distress. He has no wheezes. He has no rales. He exhibits no tenderness.  Abdominal: Soft. Bowel sounds are normal. He exhibits no  distension. There is no tenderness. There is no rebound and no guarding.  Musculoskeletal: Normal range of motion. He exhibits mild edema and no tenderness.  Neurological: He is alert and oriented to person, place, and time. Coordination normal.  Skin: Skin is warm and dry. No rash noted. He is not diaphoretic. No erythema. No pallor.  Psychiatric: He has a normal mood and affect. His behavior is normal. Judgment and thought content normal.  Vascular: No groin hematoma. Femoral pulse: Diminished on the right side and close to normal on the left side.  Distal pulses are not palpable.      ASSESSMENT AND PLAN

## 2012-08-23 ENCOUNTER — Encounter (HOSPITAL_COMMUNITY)
Admission: RE | Admit: 2012-08-23 | Discharge: 2012-08-23 | Disposition: A | Discharge status: Home / Self Care | Payer: Medicare Other | Source: Ambulatory Visit | Attending: Nephrology | Admitting: Nephrology

## 2012-08-23 LAB — IRON AND TIBC
Iron: 55 ug/dL (ref 42–135)
Saturation Ratios: 20 % (ref 20–55)
TIBC: 269 ug/dL (ref 215–435)

## 2012-08-23 MED ORDER — EPOETIN ALFA 3000 UNIT/ML IJ SOLN
3000.0000 [IU] | INTRAMUSCULAR | Status: DC
  Filled 2012-08-23: qty 1

## 2012-08-23 MED ORDER — EPOETIN ALFA 10000 UNIT/ML IJ SOLN: 3000.0000 [IU] | INTRAMUSCULAR | Status: DC

## 2012-08-23 MED ORDER — EPOETIN ALFA 10000 UNIT/ML IJ SOLN
INTRAMUSCULAR | Status: AC
  Administered 2012-08-23: 3000 [IU] via SUBCUTANEOUS
  Filled 2012-08-23: qty 1

## 2012-08-23 MED ORDER — EPOETIN ALFA 10000 UNIT/ML IJ SOLN: 3000.0000 [IU] | Freq: Once | INTRAMUSCULAR | Status: DC

## 2012-08-24 ENCOUNTER — Encounter: Payer: Self-pay | Admitting: Cardiovascular Disease

## 2012-08-24 NOTE — Assessment & Plan Note (Signed)
The patient is doing well after her recent stent placement to the left external iliac artery. His ABI improved from 0.34-0.64 with resolution of rest pain. He continues to have mild claudication related to his occluded left SFA. He has borderline significant disease on the right side and diffusely diseased SFA. However, he denies claudication on the right side. His functional capacity is overall very low due to his multiple comorbidities. I recommend continuing dual antiplatelet therapy likely long-term if tolerated. Decrease aspirin to 81 mg once daily. Proceed with an aortoiliac duplex in 3 months.

## 2012-08-27 ENCOUNTER — Other Ambulatory Visit: Payer: Self-pay | Admitting: Nurse Practitioner

## 2012-08-30 ENCOUNTER — Encounter (HOSPITAL_COMMUNITY)
Admission: RE | Admit: 2012-08-30 | Discharge: 2012-08-30 | Disposition: A | Discharge status: Home / Self Care | Payer: Medicare Other | Source: Ambulatory Visit | Attending: Nephrology | Admitting: Nephrology

## 2012-08-30 MED ORDER — EPOETIN ALFA 3000 UNIT/ML IJ SOLN
3000.0000 [IU] | INTRAMUSCULAR | Status: DC
  Administered 2012-08-30: 3000 [IU] via SUBCUTANEOUS

## 2012-08-30 MED ORDER — EPOETIN ALFA 10000 UNIT/ML IJ SOLN
INTRAMUSCULAR | Status: AC
  Filled 2012-08-30: qty 1

## 2012-08-31 MED FILL — Epoetin Alfa Inj 10000 Unit/ML: INTRAMUSCULAR | Qty: 1 | Status: AC

## 2012-09-05 ENCOUNTER — Other Ambulatory Visit (HOSPITAL_COMMUNITY): Payer: Self-pay

## 2012-09-06 ENCOUNTER — Encounter (HOSPITAL_COMMUNITY)
Admission: RE | Admit: 2012-09-06 | Discharge: 2012-09-06 | Disposition: A | Discharge status: Home / Self Care | Payer: Medicare Other | Source: Ambulatory Visit | Attending: Nephrology | Admitting: Nephrology

## 2012-09-06 ENCOUNTER — Other Ambulatory Visit: Payer: Self-pay

## 2012-09-06 DIAGNOSIS — D649 Anemia, unspecified: Secondary | ICD-10-CM | POA: Insufficient documentation

## 2012-09-06 MED ORDER — EPOETIN ALFA 10000 UNIT/ML IJ SOLN
INTRAMUSCULAR | Status: AC
  Administered 2012-09-06: 3000 [IU] via SUBCUTANEOUS
  Filled 2012-09-06: qty 1

## 2012-09-06 MED ORDER — EPOETIN ALFA 3000 UNIT/ML IJ SOLN
3000.0000 [IU] | INTRAMUSCULAR | Status: DC
  Filled 2012-09-06: qty 1

## 2012-09-13 ENCOUNTER — Encounter (HOSPITAL_COMMUNITY)
Admission: RE | Admit: 2012-09-13 | Discharge: 2012-09-13 | Disposition: A | Discharge status: Home / Self Care | Payer: Medicare Other | Source: Ambulatory Visit | Attending: Nephrology | Admitting: Nephrology

## 2012-09-13 MED ORDER — EPOETIN ALFA 10000 UNIT/ML IJ SOLN
INTRAMUSCULAR | Status: AC
  Filled 2012-09-13: qty 1

## 2012-09-13 MED ORDER — EPOETIN ALFA 3000 UNIT/ML IJ SOLN
3000.0000 [IU] | INTRAMUSCULAR | Status: AC
  Administered 2012-09-13: 3000 [IU] via SUBCUTANEOUS
  Filled 2012-09-13: qty 1

## 2012-09-15 ENCOUNTER — Encounter (INDEPENDENT_AMBULATORY_CARE_PROVIDER_SITE_OTHER): Payer: Medicare Other

## 2012-09-15 DIAGNOSIS — I6529 Occlusion and stenosis of unspecified carotid artery: Secondary | ICD-10-CM

## 2012-09-18 ENCOUNTER — Other Ambulatory Visit: Payer: Self-pay | Admitting: Nurse Practitioner

## 2012-09-18 MED FILL — Epoetin Alfa Inj 10000 Unit/ML: INTRAMUSCULAR | Qty: 1 | Status: AC

## 2012-09-19 ENCOUNTER — Other Ambulatory Visit (HOSPITAL_COMMUNITY): Payer: Self-pay | Admitting: *Deleted

## 2012-09-20 ENCOUNTER — Encounter (HOSPITAL_COMMUNITY)
Admission: RE | Admit: 2012-09-20 | Discharge: 2012-09-20 | Disposition: A | Discharge status: Home / Self Care | Payer: Medicare Other | Source: Ambulatory Visit | Attending: Nephrology | Admitting: Nephrology

## 2012-09-20 LAB — IRON AND TIBC
Iron: 45 ug/dL (ref 42–135)
TIBC: 253 ug/dL (ref 215–435)
UIBC: 208 ug/dL (ref 125–400)

## 2012-09-20 LAB — POCT HEMOGLOBIN-HEMACUE: Hemoglobin: 11.7 g/dL — ABNORMAL LOW (ref 13.0–17.0)

## 2012-09-20 LAB — FERRITIN: Ferritin: 222 ng/mL (ref 22–322)

## 2012-09-20 MED ORDER — EPOETIN ALFA 10000 UNIT/ML IJ SOLN
INTRAMUSCULAR | Status: AC
  Filled 2012-09-20: qty 1

## 2012-09-20 MED ORDER — EPOETIN ALFA 3000 UNIT/ML IJ SOLN
3000.0000 [IU] | INTRAMUSCULAR | Status: DC
  Administered 2012-09-20: 3000 [IU] via SUBCUTANEOUS

## 2012-09-27 ENCOUNTER — Other Ambulatory Visit: Payer: Self-pay | Admitting: Nurse Practitioner

## 2012-09-27 ENCOUNTER — Other Ambulatory Visit: Payer: Self-pay | Admitting: Family Medicine

## 2012-09-27 ENCOUNTER — Encounter (HOSPITAL_COMMUNITY)
Admission: RE | Admit: 2012-09-27 | Discharge: 2012-09-27 | Disposition: A | Discharge status: Home / Self Care | Payer: Medicare Other | Source: Ambulatory Visit | Attending: Nephrology | Admitting: Nephrology

## 2012-09-27 LAB — POCT HEMOGLOBIN-HEMACUE: Hemoglobin: 11.3 g/dL — ABNORMAL LOW (ref 13.0–17.0)

## 2012-09-27 MED ORDER — EPOETIN ALFA 10000 UNIT/ML IJ SOLN
INTRAMUSCULAR | Status: AC
  Administered 2012-09-27: 3000 [IU] via SUBCUTANEOUS
  Filled 2012-09-27: qty 1

## 2012-09-27 MED ORDER — EPOETIN ALFA 3000 UNIT/ML IJ SOLN
3000.0000 [IU] | INTRAMUSCULAR | Status: DC
  Filled 2012-09-27: qty 1

## 2012-09-28 NOTE — Telephone Encounter (Signed)
Refill both for 3 months.  He will need follow up within 2 months to reassess thyroid function.

## 2012-09-28 NOTE — Telephone Encounter (Signed)
Diphenoxylate... Last refill 08/17/12 #30 3 refills Methimazole 4/23/4 #30 5 refills  Last visit 03/15/12

## 2012-10-04 ENCOUNTER — Encounter (HOSPITAL_COMMUNITY)
Admission: RE | Admit: 2012-10-04 | Discharge: 2012-10-04 | Disposition: A | Discharge status: Home / Self Care | Payer: Medicare Other | Source: Ambulatory Visit | Attending: Nephrology | Admitting: Nephrology

## 2012-10-04 DIAGNOSIS — D649 Anemia, unspecified: Secondary | ICD-10-CM | POA: Insufficient documentation

## 2012-10-04 LAB — POCT HEMOGLOBIN-HEMACUE: Hemoglobin: 10.5 g/dL — ABNORMAL LOW (ref 13.0–17.0)

## 2012-10-04 MED ORDER — EPOETIN ALFA 10000 UNIT/ML IJ SOLN
INTRAMUSCULAR | Status: AC
  Administered 2012-10-04: 3000 [IU] via SUBCUTANEOUS
  Filled 2012-10-04: qty 1

## 2012-10-04 MED ORDER — EPOETIN ALFA 3000 UNIT/ML IJ SOLN
3000.0000 [IU] | INTRAMUSCULAR | Status: DC
  Filled 2012-10-04: qty 1

## 2012-10-06 ENCOUNTER — Ambulatory Visit (INDEPENDENT_AMBULATORY_CARE_PROVIDER_SITE_OTHER): Payer: Medicare Other | Admitting: Cardiovascular Disease

## 2012-10-06 ENCOUNTER — Other Ambulatory Visit (INDEPENDENT_AMBULATORY_CARE_PROVIDER_SITE_OTHER): Payer: Medicare Other

## 2012-10-06 ENCOUNTER — Encounter: Payer: Self-pay | Admitting: Cardiovascular Disease

## 2012-10-06 VITALS — BP 95/59 | HR 73 | Ht 68.5 in | Wt 195.4 lb

## 2012-10-06 DIAGNOSIS — I255 Ischemic cardiomyopathy: Secondary | ICD-10-CM

## 2012-10-06 DIAGNOSIS — I739 Peripheral vascular disease, unspecified: Secondary | ICD-10-CM

## 2012-10-06 DIAGNOSIS — I2510 Atherosclerotic heart disease of native coronary artery without angina pectoris: Secondary | ICD-10-CM

## 2012-10-06 DIAGNOSIS — E785 Hyperlipidemia, unspecified: Secondary | ICD-10-CM

## 2012-10-06 DIAGNOSIS — I2589 Other forms of chronic ischemic heart disease: Secondary | ICD-10-CM

## 2012-10-06 DIAGNOSIS — D649 Anemia, unspecified: Secondary | ICD-10-CM

## 2012-10-06 LAB — HEPATIC FUNCTION PANEL
Alkaline Phosphatase: 72 U/L (ref 39–117)
Bilirubin, Direct: 0 mg/dL (ref 0.0–0.3)

## 2012-10-06 NOTE — Assessment & Plan Note (Signed)
He claudication symptoms have improved.  Will continue plavix for now.  Dr. Kirke Corin will likely DC plavix  in November.

## 2012-10-06 NOTE — Assessment & Plan Note (Addendum)
Her seems to be feeling better from a cardiac standpoint. He still eats extra cards, cats, and some extra salt. I've reviewed a healthy diet with him.  Will check fasting lipids, BMP and HFP today.   He's not had any episodes of angina. His heart failure seems to be well-controlled. We'll see him again in 6 months.

## 2012-10-06 NOTE — Assessment & Plan Note (Signed)
Kurt Richard has anemia due to chronic renal insufficiency.    He has not reported any blood in hi stool - despite being on Plavix.

## 2012-10-06 NOTE — Patient Instructions (Addendum)
Your physician wants you to follow-up in: 6 months  You will receive a reminder letter in the mail two months in advance. If you don't receive a letter, please call our office to schedule the follow-up appointment.  Your physician recommends that you return for a FASTING lipid profile: 6 months   

## 2012-10-06 NOTE — Progress Notes (Signed)
Kurt Richard Date of Birth  02/16/1945 [facility]     [place] [facility]  [address]    [address]   [address] [place], [place]  [address]    [place], [place]  [address] [phone]  Fax  [phone]  [phone]  Fax [phone]  Problem list: 1. Acute on chronic systolic CHF  Echo Feb. 2014 Left ventricle: The cavity size was normal.  moderate LVH.  The estimated ejection fraction was in the range of 35% to 40%. There is moderate hypokinesis of the mid-distalanteroseptal myocardium.  pseudonormal left ventricular filling pattern, with concomitant abnormal relaxation and increased filling pressure (grade 2 diastolic dysfunction). - Aortic valve: There was very mild stenosis. Mean gradient:39mm Hg (S). Peak gradient: 16mm Hg (S). - Mitral valve: Calcified annulus. Mildly thickened leaflets .- Left atrium: The atrium was moderately dilated. - Right ventricle: The cavity size was mildly dilated. - Right atrium: The atrium was mildly dilated.  2. C. Diff colitis. 3. Acute renal insufficiency, d/c Cr 1.54  4. CAD - recent late-presenting anterior MI 04/2011 with 3V dz s/p CABG   - postop course 04/2011 complicated by LV dysfunction requiring IABP/inotropes, ABL anemia, CVA  5.  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 Diabetes mellitus  7. Tobacco abuse  8. hypertension 9. hyperlipidemia  History of Present Illness:  Kurt Richard is a 67 year old gentleman with the above-noted medical history.  He is still having problems with his abdomen and the diarrhea.  The last stool sample was negative for c. Diff. He needs to have another ERCP at [facility]. He has done well from a cardiac standpoint.  No further exacerbations of CHf.  No angina.  April 13, 2012: Kurt Richard is doing better.  He saw Kurt Richard for pre-op evaluation prior gallbladder surgery. He had an echocardiogram which revealed some improvement of his left ventricular systolic function. His ejection fraction is now 35-40%.  His  preop labs also revealed that he has chronic renal insufficiency with a creatinine of 2.3. His BUN is also elevated. In addition, he also has anemia.  His Lasix was decreased to 40 mg 3 times a week after seeing his elevated creatinine.  His breathing remains the same. He occasionally will take an extra Lasix if he develops chest congestion.  Jun 29, 2012:  He's not having episodes of chest pain or shortness of breath. Kurt Richard is making some slow progress.  He is not able to exercise because of left calf pain. He is only able to stand on his legs for several minutes.  He also has neuropathy which limits his exercise. He has not been eating any extra salt.   His baseline creatinine is 2.2.  He does not want to start on dialysis.    Sept. 5, 2014:  He has done well.  His Hb is still low.  He goes for Procrit injection every week and iron infusions on occasion.  No obvious blood in his stool.  He has had peripheral arterial evaluation and stenting of his left iliac artery:  Conclusions:  1. Severe focal stenosis in the distal left external iliac artery. Moderate to significant diffuse disease in the distal right common iliac artery and right external iliac artery.  2. Occluded left SFA in a long segment with collaterals from the profunda. Three-vessel runoff below the knee.  3. Diffusely diseased right SFA throughout its course with three-vessel runoff below the knee.  4. Successful self-expanding stent placement to the left external iliac artery.  He was started back on the plavix.  He still eats an unrestricted diet - still eats carbs and fats as well as some extra salt.   Current Outpatient Prescriptions on File Prior to Visit  Medication Sig Dispense Refill  . aspirin 81 MG EC tablet Take 1 tablet (81 mg total) by mouth daily.      [name] BIDIL 20-37.5 MG per tablet TAKE 1 TABLET BY MOUTH TWICE A DAY  60 tablet  5  . carvedilol (COREG) 25 MG tablet Take 25 mg by mouth 2 (two) times daily with  a meal.      . clopidogrel (PLAVIX) 75 MG tablet Take 1 tablet (75 mg total) by mouth daily.  30 tablet  11  . diphenoxylate-atropine (LOMOTIL) 2.5-0.025 MG per tablet TAKE 1 TABLET BY MOUTH 4 TIMES A DAY AS NEEDED FOR DIARRHEA OR LOOSE STOOLS  30 tablet  2  . fluticasone (FLONASE) 50 MCG/ACT nasal spray Place 2 sprays into the nose daily.      . furosemide (LASIX) 40 MG tablet Take 40 mg by mouth 3 (three) times a week. Take 40 mg on Mon., Wed., and Fri.      . isosorbide-hydrALAZINE (BIDIL) 20-37.5 MG per tablet Take 1 tablet by mouth 2 (two) times daily.      [name] loperamide (IMODIUM A-D) 2 MG tablet Take 2 mg by mouth 2 (two) times daily as needed for diarrhea or loose stools.       . methimazole (TAPAZOLE) 5 MG tablet Take 5 mg by mouth 2 (two) times daily.      . methimazole (TAPAZOLE) 5 MG tablet TAKE 1 TO 2 TABLETS TWICE DAILY  30 tablet  2  . nitroGLYCERIN (NITROSTAT) 0.4 MG SL tablet Place 0.4 mg under the tongue every 5 (five) minutes as needed for chest pain.      [name] oxyCODONE (OXY IR/ROXICODONE) 5 MG immediate release tablet Take 5 mg by mouth as needed for pain. Per Dr. Georgann Housekeeper      . paregoric 2 MG/5ML solution Take 5 mLs by mouth 4 (four) times daily as needed for diarrhea or loose stools.      . sodium bicarbonate 325 MG tablet Take 650 mg by mouth 2 (two) times daily.        No current facility-administered medications on file prior to visit.    Allergies  Allergen Reactions  . Diltiazem Hcl Hives and Swelling  . Nifedipine Hives and Swelling    Past Medical History  Diagnosis Date  . Diabetes mellitus   . Hypertension   . Chronic systolic heart failure     echo 04/12/11: Mild LVH, EF 30-35%, mid to distal anteroseptal and apical hypokinesis, grade 2 diastolic dysfunction, moderate LAE, mild RAE.  [name] Hyperlipidemia   . Tobacco abuse   . CAD (coronary artery disease)     acute anterior STEMI, late presentation 04/06/11 LHC 3/5 demonstrated severe ostial left main 95% stenosis  and otherwise three-vessel CAD with an ejection fraction of 15%.  Emergent CABG: Dr. Dorris Fetch - Grafts: LIMA-LAD, SVG-D1, SVG-OM1, SVG-PDA and PL.  . Ischemic cardiomyopathy   . DM2 (diabetes mellitus, type 2)   . 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 Acute renal insufficiency     05/2011 admission (cr 1.54 at discharge)  . Anemia     Post-CABG  . Arthritis   . Stroke   . Sleep apnea   . Neuromuscular disorder     diabetic neuropathy  .  Diarrhea     has had c diff diarrhea  . Weakness   . Bruises easily   . PAD (peripheral artery disease) 07/28/2012    Angiography 08/2012: Right: 90% focal stenosis in external iliac artery, long occlusion of SFA with 3 vessel run off . s/p slef expanding stent placement to external iliac artery. Left: borderline significant disease in comon and external iliac arteries. Diffuse 70-90% in SFA with 3 vessel run off.     Past Surgical History  Procedure Laterality Date  . Coronary artery bypass graft  04/06/2011    Procedure: CORONARY ARTERY BYPASS GRAFTING (CABG);  Surgeon: Loreli Slot, MD;  Location: [facility] OR;  Service: Open Heart Surgery;  Laterality: N/A;  Coronary Artery Bypass Graft times four utilizing the left internal mammary artery and the Right and left  greater Saphenous veins Harvested endoscopically.  . Fibroid bypass 04/06/11    . Colon surgery      rectal fissure  . Cervical disc surgery  1997 - approximate  . Colonoscopy  07/29/2011    Procedure: COLONOSCOPY;  Surgeon: Rachael Fee, MD;  Location: [facility] ENDOSCOPY;  Service: Endoscopy;  Laterality: N/A;  . Eye surgery  2011    cataract removal    History  Smoking status  . Former Smoker -- 0.50 packs/day  . Types: Cigarettes  . Quit date: 04/06/2011  Smokeless tobacco  . Never Used    History  Alcohol Use No    Family History  Problem Relation Age of Onset  . Heart attack Mother     ?20s  . Diabetes Mother   . Heart attack Sister     86s  . Diabetes  Sister     Dietitian  . Congestive Heart Failure Father     Reviw of Systems:  Reviewed in the HPI.  All other systems are negative.  Physical Exam: Blood pressure 95/59, pulse 73, height 5' 8.5" (1.74 m), weight 195 lb 6.4 oz (88.633 kg). General: Well developed, well nourished, in no acute distress.  He appears to be generally stronger than he was during his last several visits. Head: Normocephalic, atraumatic, sclera non-icteric, mucus membranes are moist,   Neck: Supple. Carotids are 2 + without bruits. No JVD  Lungs: Clear bilaterally to auscultation.  Heart: regular rate.  normal  S1 S2. No murmurs, gallops or rubs.    Abdomen: Soft, non-tender, non-distended with normal bowel sounds. No hepatomegaly. No rebound/guarding. No masses.  Msk:  Strength and tone are normal  Extremities: No clubbing or cyanosis. No edema.  Distal pedal pulses are 2+ and equal bilaterally.  Neuro: Alert and oriented X 3. Moves all extremities spontaneously.  Psych:  Responds to questions appropriately with a normal affect.  ECG:  Assessment / Plan:

## 2012-10-06 NOTE — Assessment & Plan Note (Addendum)
He's not having any recurrent symptoms of congestive heart failure. Recommended that he avoid eating any extra salt.

## 2012-10-10 ENCOUNTER — Other Ambulatory Visit: Payer: Self-pay

## 2012-10-10 ENCOUNTER — Telehealth: Payer: Self-pay | Admitting: *Deleted

## 2012-10-10 ENCOUNTER — Other Ambulatory Visit: Payer: Self-pay | Admitting: *Deleted

## 2012-10-10 ENCOUNTER — Other Ambulatory Visit (INDEPENDENT_AMBULATORY_CARE_PROVIDER_SITE_OTHER): Payer: Medicare Other

## 2012-10-10 ENCOUNTER — Ambulatory Visit: Payer: Medicare Other

## 2012-10-10 DIAGNOSIS — E875 Hyperkalemia: Secondary | ICD-10-CM

## 2012-10-10 DIAGNOSIS — I739 Peripheral vascular disease, unspecified: Secondary | ICD-10-CM

## 2012-10-10 DIAGNOSIS — I2510 Atherosclerotic heart disease of native coronary artery without angina pectoris: Secondary | ICD-10-CM

## 2012-10-10 DIAGNOSIS — E785 Hyperlipidemia, unspecified: Secondary | ICD-10-CM

## 2012-10-10 LAB — BASIC METABOLIC PANEL
CO2: 22 mEq/L (ref 19–32)
Chloride: 112 mEq/L (ref 96–112)
GFR: 27.77 mL/min — ABNORMAL LOW (ref >60.00)
Potassium: 6.2 mEq/L (ref 3.5–5.1)
Sodium: 140 mEq/L (ref 135–145)
Sodium: 141 mEq/L (ref 135–145)

## 2012-10-10 LAB — LIPID PANEL
HDL: 31.2 mg/dL — ABNORMAL LOW (ref >39.00)
VLDL: 28.4 mg/dL (ref 0.0–40.0)

## 2012-10-10 NOTE — Telephone Encounter (Signed)
SPOKE  WITH  PT'S  SIG OTHER   LAB CALLED WITH  AN ELEVATED  K  OF  6.2    PT IS NOT TAKING ANY  SUPPLEMENTS  DISCUSSED WITH  DR Ladona Ridgel PT NEEDS TO HAVE  RECHECKED   PT  TO COME TO [address] OFFICE  FOR RECHECK  .Zack Seal

## 2012-10-11 ENCOUNTER — Encounter (HOSPITAL_COMMUNITY)
Admission: RE | Admit: 2012-10-11 | Discharge: 2012-10-11 | Disposition: A | Discharge status: Home / Self Care | Payer: Medicare Other | Source: Ambulatory Visit | Attending: Nephrology | Admitting: Nephrology

## 2012-10-11 LAB — POCT HEMOGLOBIN-HEMACUE: Hemoglobin: 10.9 g/dL — ABNORMAL LOW (ref 13.0–17.0)

## 2012-10-11 MED ORDER — EPOETIN ALFA 3000 UNIT/ML IJ SOLN
3000.0000 [IU] | INTRAMUSCULAR | Status: DC
  Filled 2012-10-11: qty 1

## 2012-10-11 MED ORDER — EPOETIN ALFA 10000 UNIT/ML IJ SOLN
INTRAMUSCULAR | Status: AC
  Administered 2012-10-11: 3000 [IU] via SUBCUTANEOUS
  Filled 2012-10-11: qty 1

## 2012-10-20 ENCOUNTER — Encounter (HOSPITAL_COMMUNITY)
Admission: RE | Admit: 2012-10-20 | Discharge: 2012-10-20 | Disposition: A | Discharge status: Home / Self Care | Payer: Medicare Other | Source: Ambulatory Visit | Attending: Nephrology | Admitting: Nephrology

## 2012-10-20 LAB — IRON AND TIBC
Iron: 49 ug/dL (ref 42–135)
Saturation Ratios: 20 % (ref 20–55)
TIBC: 242 ug/dL (ref 215–435)
UIBC: 193 ug/dL (ref 125–400)

## 2012-10-20 LAB — FERRITIN: Ferritin: 179 ng/mL (ref 22–322)

## 2012-10-20 MED ORDER — EPOETIN ALFA 3000 UNIT/ML IJ SOLN
3000.0000 [IU] | INTRAMUSCULAR | Status: DC
  Filled 2012-10-20: qty 1

## 2012-10-20 MED ORDER — EPOETIN ALFA 10000 UNIT/ML IJ SOLN
INTRAMUSCULAR | Status: AC
  Administered 2012-10-20: 13:00:00 10000 [IU] via SUBCUTANEOUS
  Filled 2012-10-20: qty 1

## 2012-10-25 ENCOUNTER — Encounter (HOSPITAL_COMMUNITY)
Admission: RE | Admit: 2012-10-25 | Discharge: 2012-10-25 | Disposition: A | Discharge status: Home / Self Care | Payer: Medicare Other | Source: Ambulatory Visit | Attending: Nephrology | Admitting: Nephrology

## 2012-10-25 MED ORDER — EPOETIN ALFA 3000 UNIT/ML IJ SOLN
3000.0000 [IU] | INTRAMUSCULAR | Status: DC
  Filled 2012-10-25: qty 1

## 2012-10-25 MED ORDER — EPOETIN ALFA 10000 UNIT/ML IJ SOLN
INTRAMUSCULAR | Status: AC
  Administered 2012-10-25: 10000 [IU] via SUBCUTANEOUS
  Filled 2012-10-25: qty 1

## 2012-11-01 ENCOUNTER — Encounter (HOSPITAL_COMMUNITY)
Admission: RE | Admit: 2012-11-01 | Discharge: 2012-11-01 | Disposition: A | Discharge status: Home / Self Care | Payer: Medicare Other | Source: Ambulatory Visit | Attending: Nephrology | Admitting: Nephrology

## 2012-11-01 DIAGNOSIS — D638 Anemia in other chronic diseases classified elsewhere: Secondary | ICD-10-CM | POA: Insufficient documentation

## 2012-11-01 DIAGNOSIS — N183 Chronic kidney disease, stage 3 unspecified: Secondary | ICD-10-CM | POA: Insufficient documentation

## 2012-11-01 MED ORDER — EPOETIN ALFA 10000 UNIT/ML IJ SOLN
INTRAMUSCULAR | Status: AC
  Administered 2012-11-01: 3000 [IU]
  Filled 2012-11-01: qty 1

## 2012-11-01 MED ORDER — EPOETIN ALFA 3000 UNIT/ML IJ SOLN
3000.0000 [IU] | INTRAMUSCULAR | Status: DC
  Filled 2012-11-01: qty 1

## 2012-11-08 ENCOUNTER — Encounter (HOSPITAL_COMMUNITY)
Admission: RE | Admit: 2012-11-08 | Discharge: 2012-11-08 | Disposition: A | Discharge status: Home / Self Care | Payer: Medicare Other | Source: Ambulatory Visit | Attending: Nephrology | Admitting: Nephrology

## 2012-11-08 MED ORDER — EPOETIN ALFA 3000 UNIT/ML IJ SOLN
3000.0000 [IU] | INTRAMUSCULAR | Status: DC
  Filled 2012-11-08: qty 1

## 2012-11-08 MED ORDER — EPOETIN ALFA 10000 UNIT/ML IJ SOLN
INTRAMUSCULAR | Status: AC
  Administered 2012-11-08: 3000 [IU] via SUBCUTANEOUS
  Filled 2012-11-08: qty 1

## 2012-11-08 NOTE — Progress Notes (Signed)
Denies having been to [place] or having been around anyone who has

## 2012-11-13 ENCOUNTER — Ambulatory Visit (HOSPITAL_COMMUNITY): Payer: Medicare Other

## 2012-11-13 ENCOUNTER — Ambulatory Visit (HOSPITAL_COMMUNITY): Payer: Medicare Other | Attending: Cardiovascular Disease

## 2012-11-13 DIAGNOSIS — I739 Peripheral vascular disease, unspecified: Secondary | ICD-10-CM

## 2012-11-13 DIAGNOSIS — E119 Type 2 diabetes mellitus without complications: Secondary | ICD-10-CM | POA: Insufficient documentation

## 2012-11-13 DIAGNOSIS — Z951 Presence of aortocoronary bypass graft: Secondary | ICD-10-CM | POA: Insufficient documentation

## 2012-11-13 DIAGNOSIS — I700 Atherosclerosis of aorta: Secondary | ICD-10-CM

## 2012-11-13 DIAGNOSIS — E785 Hyperlipidemia, unspecified: Secondary | ICD-10-CM | POA: Insufficient documentation

## 2012-11-13 DIAGNOSIS — I10 Essential (primary) hypertension: Secondary | ICD-10-CM | POA: Insufficient documentation

## 2012-11-13 DIAGNOSIS — Z87891 Personal history of nicotine dependence: Secondary | ICD-10-CM | POA: Insufficient documentation

## 2012-11-13 DIAGNOSIS — I708 Atherosclerosis of other arteries: Secondary | ICD-10-CM | POA: Insufficient documentation

## 2012-11-14 ENCOUNTER — Encounter: Payer: Self-pay | Admitting: Nurse Practitioner

## 2012-11-14 ENCOUNTER — Ambulatory Visit (INDEPENDENT_AMBULATORY_CARE_PROVIDER_SITE_OTHER): Payer: Medicare Other | Admitting: Nurse Practitioner

## 2012-11-14 VITALS — BP 180/90 | HR 73 | Ht 68.5 in | Wt 199.8 lb

## 2012-11-14 DIAGNOSIS — R0602 Shortness of breath: Secondary | ICD-10-CM

## 2012-11-14 DIAGNOSIS — I5041 Acute combined systolic (congestive) and diastolic (congestive) heart failure: Secondary | ICD-10-CM

## 2012-11-14 LAB — CBC WITH DIFFERENTIAL/PLATELET
Basophils Absolute: 0 10*3/uL (ref 0.0–0.1)
Basophils Relative: 0.6 % (ref 0.0–3.0)
Eosinophils Absolute: 0.3 10*3/uL (ref 0.0–0.7)
Eosinophils Relative: 3.9 % (ref 0.0–5.0)
HCT: 33 % — ABNORMAL LOW (ref 39.0–52.0)
Hemoglobin: 11 g/dL — ABNORMAL LOW (ref 13.0–17.0)
Lymphocytes Relative: 11.2 % — ABNORMAL LOW (ref 12.0–46.0)
Lymphs Abs: 0.9 10*3/uL (ref 0.7–4.0)
MCHC: 33.4 g/dL (ref 30.0–36.0)
MCV: 93.5 fl (ref 78.0–100.0)
Monocytes Absolute: 0.6 10*3/uL (ref 0.1–1.0)
Monocytes Relative: 7.9 % (ref 3.0–12.0)
Neutro Abs: 6.1 10*3/uL (ref 1.4–7.7)
Neutrophils Relative %: 76.4 % (ref 43.0–77.0)
Platelets: 273 10*3/uL (ref 150.0–400.0)
RBC: 3.53 Mil/uL — ABNORMAL LOW (ref 4.22–5.81)
RDW: 16.2 % — ABNORMAL HIGH (ref 11.5–14.6)
WBC: 8 10*3/uL (ref 4.5–10.5)

## 2012-11-14 LAB — BRAIN NATRIURETIC PEPTIDE: Pro B Natriuretic peptide (BNP): 1015 pg/mL — ABNORMAL HIGH (ref 0.0–100.0)

## 2012-11-14 LAB — BASIC METABOLIC PANEL
BUN: 52 mg/dL — ABNORMAL HIGH (ref 6–23)
CO2: 24 mEq/L (ref 19–32)
Calcium: 9 mg/dL (ref 8.4–10.5)
Chloride: 107 mEq/L (ref 96–112)
Creatinine, Ser: 2.6 mg/dL — ABNORMAL HIGH (ref 0.4–1.5)
GFR: 26.06 mL/min — ABNORMAL LOW (ref >60.00)
Glucose, Bld: 176 mg/dL — ABNORMAL HIGH (ref 70–99)
Potassium: 5.6 mEq/L — ABNORMAL HIGH (ref 3.5–5.1)
Sodium: 142 mEq/L (ref 135–145)

## 2012-11-14 MED ORDER — FUROSEMIDE 40 MG PO TABS: 40.0000 mg | ORAL_TABLET | Freq: Every day | ORAL | Status: DC

## 2012-11-14 NOTE — Patient Instructions (Addendum)
We need to check labs today  Stay on your current medicines but we are changing the Lasix to every day  I will see you in 2 to 3 weeks  Call the [facility] office at [phone] if you have any questions, problems or concerns.

## 2012-11-14 NOTE — Progress Notes (Signed)
Kurt Richard Date of Birth: [birth year] Medical Record #[id number]  History of Present Illness: [name] is seen back today for a work in visit. Seen for Dr. Elease Hashimoto. He has chronic systolic & diastolic CHF - EF of 35 to 40% per echo back in February of 2014. Other issues include CKD, CAD with past anterior MI with CABG back in 2013, c-diff colitis, DM, anemia - requires Procrit, PVD - remains on Plavix, tobacco abuse, HTN and HLD.   Was just here a little over a month ago - seemed to be doing ok - does not adhere to a low salt, heart healthy diet. Has progressive CKD - apparently does not want dialysis.   Comes back today. Here with his partner. He has had progressive dyspnea/PND/orthopnea over the past month - mostly at night. More cough. Using some extra Lasix about 2 x a week. More swelling in his feet. Says he is not using salt. BP only being checked about 1 x a week - usually in the 140's at home. Quite high here today. Has not seen Dr. Hyman Hopes recently. Still not very interested in starting dialysis. Still with significant diarrhea but no apparent Cdiff.   Current Outpatient Prescriptions  Medication Sig Dispense Refill  . aspirin 81 MG EC tablet Take 1 tablet (81 mg total) by mouth daily.      [name] BIDIL 20-37.5 MG per tablet TAKE 1 TABLET BY MOUTH TWICE A DAY  60 tablet  5  . carvedilol (COREG) 25 MG tablet Take 25 mg by mouth 2 (two) times daily with a meal.      . clopidogrel (PLAVIX) 75 MG tablet Take 1 tablet (75 mg total) by mouth daily.  30 tablet  11  . diphenoxylate-atropine (LOMOTIL) 2.5-0.025 MG per tablet TAKE 1 TABLET BY MOUTH 4 TIMES A DAY AS NEEDED FOR DIARRHEA OR LOOSE STOOLS  30 tablet  2  . fluticasone (FLONASE) 50 MCG/ACT nasal spray Place 2 sprays into the nose daily.      . furosemide (LASIX) 40 MG tablet Take 40 mg by mouth 3 (three) times a week. Take 40 mg on Mon., Wed., and Fri.      . loperamide (IMODIUM A-D) 2 MG tablet Take 2 mg by mouth 2 (two) times daily as  needed for diarrhea or loose stools.       . methimazole (TAPAZOLE) 5 MG tablet Take 5 mg by mouth 2 (two) times daily.      . nitroGLYCERIN (NITROSTAT) 0.4 MG SL tablet Place 0.4 mg under the tongue every 5 (five) minutes as needed for chest pain.      [name] oxyCODONE (OXY IR/ROXICODONE) 5 MG immediate release tablet Take 5 mg by mouth as needed for pain. Per Dr. Georgann Housekeeper      . paregoric 2 MG/5ML solution Take 5 mLs by mouth 4 (four) times daily as needed for diarrhea or loose stools.      . sodium bicarbonate 325 MG tablet Take 650 mg by mouth 2 (two) times daily.        No current facility-administered medications for this visit.    Allergies  Allergen Reactions  . Diltiazem Hcl Hives and Swelling  . Nifedipine Hives and Swelling    Past Medical History  Diagnosis Date  . Diabetes mellitus   . Hypertension   . Chronic systolic heart failure     echo 04/12/11: Mild LVH, EF 30-35%, mid to distal anteroseptal and apical hypokinesis, grade 2 diastolic dysfunction, moderate  LAE, mild RAE.  [name] Hyperlipidemia   . Tobacco abuse   . CAD (coronary artery disease)     acute anterior STEMI, late presentation 04/06/11 LHC 3/5 demonstrated severe ostial left main 95% stenosis and otherwise three-vessel CAD with an ejection fraction of 15%.  Emergent CABG: Dr. Dorris Fetch - Grafts: LIMA-LAD, SVG-D1, SVG-OM1, SVG-PDA and PL.  . Ischemic cardiomyopathy   . DM2 (diabetes mellitus, type 2)   . 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 Acute renal insufficiency     05/2011 admission (cr 1.54 at discharge)  . Anemia     Post-CABG  . Arthritis   . Stroke   . Sleep apnea   . Neuromuscular disorder     diabetic neuropathy  . Diarrhea     has had c diff diarrhea  . Weakness   . Bruises easily   . PAD (peripheral artery disease) 07/28/2012    Angiography 08/2012: Right: 90% focal stenosis in external iliac artery, long occlusion of SFA with 3 vessel run off . s/p slef expanding stent placement to  external iliac artery. Left: borderline significant disease in comon and external iliac arteries. Diffuse 70-90% in SFA with 3 vessel run off.     Past Surgical History  Procedure Laterality Date  . Coronary artery bypass graft  04/06/2011    Procedure: CORONARY ARTERY BYPASS GRAFTING (CABG);  Surgeon: Loreli Slot, MD;  Location: [facility] OR;  Service: Open Heart Surgery;  Laterality: N/A;  Coronary Artery Bypass Graft times four utilizing the left internal mammary artery and the Right and left  greater Saphenous veins Harvested endoscopically.  . Fibroid bypass 04/06/11    . Colon surgery      rectal fissure  . Cervical disc surgery  1997 - approximate  . Colonoscopy  07/29/2011    Procedure: COLONOSCOPY;  Surgeon: Rachael Fee, MD;  Location: [facility] ENDOSCOPY;  Service: Endoscopy;  Laterality: N/A;  . Eye surgery  2011    cataract removal    History  Smoking status  . Former Smoker -- 0.50 packs/day  . Types: Cigarettes  . Quit date: 04/06/2011  Smokeless tobacco  . Never Used    History  Alcohol Use No    Family History  Problem Relation Age of Onset  . Heart attack Mother     ?16s  . Diabetes Mother   . Heart attack Sister     30s  . Diabetes Sister     Dietitian  . Congestive Heart Failure Father     Review of Systems: The review of systems is per the HPI.  All other systems were reviewed and are negative.  Physical Exam: Ht 5' 8.5" (1.74 m)  Wt 199 lb 12.8 oz (90.629 kg)  BMI 29.93 kg/m2 Patient is very pleasant and in no acute distress. Weight is up 4 pounds over the past 6 weeks. Looks chronically ill. Skin is warm and dry. Color is sallow.  HEENT is unremarkable. Normocephalic/atraumatic. PERRL. Sclera are nonicteric. Neck is supple. No masses. No JVD. Lungs are clear. Cardiac exam shows a regular rate and rhythm. Abdomen is soft. Extremities are with trace edema. Gait and ROM are intact. No gross neurologic deficits noted.  LABORATORY DATA: PENDING  Lab  Results  Component Value Date   WBC 9.2 08/01/2012   HGB 11.3* 11/08/2012   HCT 29.5* 08/01/2012   PLT 262 08/01/2012   GLUCOSE 149* 10/10/2012   CHOL 220* 10/10/2012   TRIG 142.0 10/10/2012  HDL 31.20* 10/10/2012   LDLDIRECT 155.3 10/10/2012   LDLCALC 33 08/06/2011   ALT 9 10/06/2012   AST 13 10/06/2012   NA 141 10/10/2012   K 4.9 10/10/2012   CL 112 10/10/2012   CREATININE 2.3* 10/10/2012   BUN 48* 10/10/2012   CO2 22 10/10/2012   TSH 1.44 12/14/2011   INR 1.08 08/01/2012   HGBA1C 6.6* 12/14/2011   Echo Study Conclusions from February 2014  - Left ventricle: The cavity size was normal. Wall thickness was increased in a pattern of moderate LVH. Systolic function was moderately reduced. The estimated ejection fraction was in the range of 35% to 40%. There is moderate hypokinesis of the mid-distalanteroseptal myocardium. Features are consistent with a pseudonormal left ventricular filling pattern, with concomitant abnormal relaxation and increased filling pressure (grade 2 diastolic dysfunction). - Aortic valve: There was very mild stenosis. Mean gradient: 7mm Hg (S). Peak gradient: 13mm Hg (S). - Mitral valve: Calcified annulus. Mildly thickened leaflets . - Left atrium: The atrium was moderately dilated. - Right ventricle: The cavity size was mildly dilated. - Right atrium: The atrium was mildly dilated.   Assessment / Plan: 1. Dyspnea - probably multifactorial - check labs today. Lasix is increased.   2. Chronic combined systolic and diastolic HF - increasing Lasix today. Check labs - will see back in 2 to 3 weeks.   3. CAD - prior CABG - no chest pain reported.   4. Progressive CKD - followed by Dr. Hyman Hopes  5. HTN - BP is 170/70 by me - I have asked them to monitor at home. Hopefully it will improve with the increase in Lasix but may need further adjustments.   Patient is agreeable to this plan and will call if any problems develop in the interim.   Rosalio Macadamia, RN, ANP-C [facility] [address] [place], [place]  [address]   Patient is agreeable to this plan and will call if any problems develop in the interim.   Rosalio Macadamia, RN, ANP-C [facility] [address] [place], [place]  [address]

## 2012-11-16 ENCOUNTER — Encounter (HOSPITAL_COMMUNITY)
Admission: RE | Admit: 2012-11-16 | Discharge: 2012-11-16 | Disposition: A | Discharge status: Home / Self Care | Payer: Medicare Other | Source: Ambulatory Visit | Attending: Nephrology | Admitting: Nephrology

## 2012-11-16 ENCOUNTER — Other Ambulatory Visit: Payer: Self-pay | Admitting: Nurse Practitioner

## 2012-11-16 LAB — IRON AND TIBC
Iron: 55 ug/dL (ref 42–135)
Saturation Ratios: 22 % (ref 20–55)

## 2012-11-16 LAB — POCT HEMOGLOBIN-HEMACUE: Hemoglobin: 11.3 g/dL — ABNORMAL LOW (ref 13.0–17.0)

## 2012-11-16 MED ORDER — EPOETIN ALFA 3000 UNIT/ML IJ SOLN
3000.0000 [IU] | INTRAMUSCULAR | Status: DC
  Filled 2012-11-16: qty 1

## 2012-11-16 MED ORDER — EPOETIN ALFA 10000 UNIT/ML IJ SOLN
INTRAMUSCULAR | Status: AC
  Administered 2012-11-16: 3000 [IU] via SUBCUTANEOUS
  Filled 2012-11-16: qty 1

## 2012-11-21 ENCOUNTER — Other Ambulatory Visit: Payer: Self-pay | Admitting: Family Medicine

## 2012-11-23 ENCOUNTER — Encounter (HOSPITAL_COMMUNITY)
Admission: RE | Admit: 2012-11-23 | Discharge: 2012-11-23 | Disposition: A | Discharge status: Home / Self Care | Payer: Medicare Other | Source: Ambulatory Visit | Attending: Nephrology | Admitting: Nephrology

## 2012-11-23 LAB — POCT HEMOGLOBIN-HEMACUE: Hemoglobin: 11 g/dL — ABNORMAL LOW (ref 13.0–17.0)

## 2012-11-23 MED ORDER — EPOETIN ALFA 3000 UNIT/ML IJ SOLN
3000.0000 [IU] | INTRAMUSCULAR | Status: DC
  Administered 2012-11-23: 13:00:00 3000 [IU] via SUBCUTANEOUS
  Filled 2012-11-23: qty 1

## 2012-11-23 MED ORDER — EPOETIN ALFA 10000 UNIT/ML IJ SOLN
INTRAMUSCULAR | Status: AC
  Filled 2012-11-23: qty 1

## 2012-11-24 MED FILL — Epoetin Alfa Inj 10000 Unit/ML: INTRAMUSCULAR | Qty: 1 | Status: AC

## 2012-11-30 ENCOUNTER — Encounter (HOSPITAL_COMMUNITY)
Admission: RE | Admit: 2012-11-30 | Discharge: 2012-11-30 | Disposition: A | Discharge status: Home / Self Care | Payer: Medicare Other | Source: Ambulatory Visit | Attending: Nephrology | Admitting: Nephrology

## 2012-11-30 DIAGNOSIS — I129 Hypertensive chronic kidney disease with stage 1 through stage 4 chronic kidney disease, or unspecified chronic kidney disease: Secondary | ICD-10-CM | POA: Insufficient documentation

## 2012-11-30 DIAGNOSIS — D638 Anemia in other chronic diseases classified elsewhere: Secondary | ICD-10-CM | POA: Insufficient documentation

## 2012-11-30 DIAGNOSIS — N183 Chronic kidney disease, stage 3 unspecified: Secondary | ICD-10-CM | POA: Insufficient documentation

## 2012-11-30 LAB — POCT HEMOGLOBIN-HEMACUE: Hemoglobin: 11.5 g/dL — ABNORMAL LOW (ref 13.0–17.0)

## 2012-11-30 MED ORDER — EPOETIN ALFA 10000 UNIT/ML IJ SOLN
INTRAMUSCULAR | Status: AC
  Administered 2012-11-30: 3000 [IU]
  Filled 2012-11-30: qty 1

## 2012-11-30 MED ORDER — EPOETIN ALFA 3000 UNIT/ML IJ SOLN: 3000.0000 [IU] | INTRAMUSCULAR | Status: DC

## 2012-12-04 ENCOUNTER — Encounter: Payer: Self-pay | Admitting: Nurse Practitioner

## 2012-12-04 ENCOUNTER — Ambulatory Visit (INDEPENDENT_AMBULATORY_CARE_PROVIDER_SITE_OTHER): Payer: Medicare Other | Admitting: Nurse Practitioner

## 2012-12-04 VITALS — BP 140/72 | HR 68 | Ht 68.5 in | Wt 201.8 lb

## 2012-12-04 DIAGNOSIS — I5041 Acute combined systolic (congestive) and diastolic (congestive) heart failure: Secondary | ICD-10-CM

## 2012-12-04 LAB — BASIC METABOLIC PANEL
BUN: 52 mg/dL — ABNORMAL HIGH (ref 6–23)
CO2: 25 mEq/L (ref 19–32)
Calcium: 8.6 mg/dL (ref 8.4–10.5)
Chloride: 107 mEq/L (ref 96–112)
Creatinine, Ser: 2.6 mg/dL — ABNORMAL HIGH (ref 0.4–1.5)
GFR: 26.06 mL/min — ABNORMAL LOW (ref >60.00)
Glucose, Bld: 118 mg/dL — ABNORMAL HIGH (ref 70–99)
Potassium: 5.6 mEq/L — ABNORMAL HIGH (ref 3.5–5.1)
Sodium: 139 mEq/L (ref 135–145)

## 2012-12-04 NOTE — Patient Instructions (Addendum)
We need to check labs today - based on these results - may need to get your visit with Dr. Hyman Hopes moved up  See Dr. Elease Hashimoto in 6 weeks  Stay on your current medicines  Call the [facility] office at [phone] if you have any questions, problems or concerns.

## 2012-12-04 NOTE — Progress Notes (Signed)
Kurt Richard Date of Birth: Feb 20, 1945 Medical Record #[id number]  History of Present Illness: Kurt Richard is seen back today for a 3 week check. Seen for Dr. Elease Hashimoto. Kurt Richard has chronic systolic & diastolic CHF - EF of 35 to 40% per echo back in February of 2014. Other issues include CKD, CAD with past anterior MI with CABG back in 2013, c-diff colitis, DM, anemia - requires Procrit, PVD - remains on Plavix, tobacco abuse, HTN and HLD.   Seen 3 weeks ago with progressive dyspnea/PND/orthopnea over the past month - mostly at night. More cough. Using some extra Lasix about 2 x a week. More swelling in Kurt Richard feet. Says Kurt Richard was not using salt. BP quite high at that visit. Still not very interested in starting dialysis. Still with significant diarrhea but no apparent Cdiff. We increased Kurt Richard Lasix.   Comes back today. Here with Kurt Richard partner. Kurt Richard is doing better. Says Kurt Richard weight is down by Kurt Richard scales at home - down to 196. Almost no more congestion at night. Breathing much easier. No chest pain. Not due to see Dr. Hyman Hopes until after the New Year. BP is better at home. Now drinking muscardine juice (was drinking tomato and OJ). Stopped Kurt Richard bananas due to Kurt Richard potassium. Needs repeat labs today.    Current Outpatient Prescriptions  Medication Sig Dispense Refill  . aspirin 81 MG EC tablet Take 1 tablet (81 mg total) by mouth daily.      [name] BIDIL 20-37.5 MG per tablet TAKE 1 TABLET BY MOUTH TWICE A DAY  60 tablet  5  . carvedilol (COREG) 25 MG tablet Take 25 mg by mouth 2 (two) times daily with a meal.      . clopidogrel (PLAVIX) 75 MG tablet Take 1 tablet (75 mg total) by mouth daily.  30 tablet  11  . diphenoxylate-atropine (LOMOTIL) 2.5-0.025 MG per tablet TAKE 1 TABLET BY MOUTH 4 TIMES A DAY AS NEEDED FOR DIARRHEA OR LOOSE STOOLS  30 tablet  2  . epoetin alfa (EPOGEN,PROCRIT) 3000 UNIT/ML injection Inject 3,000 Units into the skin once.      . fluticasone (FLONASE) 50 MCG/ACT nasal spray Place 2 sprays into the  nose daily.      . furosemide (LASIX) 40 MG tablet Take 40 mg by mouth daily.      . methimazole (TAPAZOLE) 5 MG tablet Take 5 mg by mouth 2 (two) times daily.      . nitroGLYCERIN (NITROSTAT) 0.4 MG SL tablet Place 0.4 mg under the tongue every 5 (five) minutes as needed for chest pain.      [name] oxyCODONE (OXY IR/ROXICODONE) 5 MG immediate release tablet Take 5 mg by mouth as needed for pain. Per Dr. Georgann Housekeeper      . paregoric 2 MG/5ML solution Take 5 mLs by mouth 4 (four) times daily as needed for diarrhea or loose stools.      . sodium bicarbonate 325 MG tablet Take 650 mg by mouth 2 (two) times daily.        No current facility-administered medications for this visit.    Allergies  Allergen Reactions  . Diltiazem Hcl Hives and Swelling  . Nifedipine Hives and Swelling    Past Medical History  Diagnosis Date  . Diabetes mellitus   . Hypertension   . Chronic systolic heart failure     echo 04/12/11: Mild LVH, EF 30-35%, mid to distal anteroseptal and apical hypokinesis, grade 2 diastolic dysfunction, moderate LAE, mild RAE.  [name]  Hyperlipidemia   . Tobacco abuse   . CAD (coronary artery disease)     acute anterior STEMI, late presentation 04/06/11 LHC 3/5 demonstrated severe ostial left main 95% stenosis and otherwise three-vessel CAD with an ejection fraction of 15%.  Emergent CABG: Dr. Dorris Fetch - Grafts: LIMA-LAD, SVG-D1, SVG-OM1, SVG-PDA and PL.  . Ischemic cardiomyopathy   . DM2 (diabetes mellitus, type 2)   . 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 Acute renal insufficiency     05/2011 admission (cr 1.54 at discharge)  . Anemia     Post-CABG  . Arthritis   . Stroke   . Sleep apnea   . Neuromuscular disorder     diabetic neuropathy  . Diarrhea     has had c diff diarrhea  . Weakness   . Bruises easily   . PAD (peripheral artery disease) 07/28/2012    Angiography 08/2012: Right: 90% focal stenosis in external iliac artery, long occlusion of SFA with 3 vessel run off .  s/p slef expanding stent placement to external iliac artery. Left: borderline significant disease in comon and external iliac arteries. Diffuse 70-90% in SFA with 3 vessel run off.     Past Surgical History  Procedure Laterality Date  . Coronary artery bypass graft  04/06/2011    Procedure: CORONARY ARTERY BYPASS GRAFTING (CABG);  Surgeon: Loreli Slot, MD;  Location: [facility] OR;  Service: Open Heart Surgery;  Laterality: N/A;  Coronary Artery Bypass Graft times four utilizing the left internal mammary artery and the Right and left  greater Saphenous veins Harvested endoscopically.  . Fibroid bypass 04/06/11    . Colon surgery      rectal fissure  . Cervical disc surgery  1997 - approximate  . Colonoscopy  07/29/2011    Procedure: COLONOSCOPY;  Surgeon: Rachael Fee, MD;  Location: [facility] ENDOSCOPY;  Service: Endoscopy;  Laterality: N/A;  . Eye surgery  2011    cataract removal    History  Smoking status  . Former Smoker -- 0.50 packs/day  . Types: Cigarettes  . Quit date: 04/06/2011  Smokeless tobacco  . Never Used    History  Alcohol Use No    Family History  Problem Relation Age of Onset  . Heart attack Mother     ?38s  . Diabetes Mother   . Heart attack Sister     12s  . Diabetes Sister     Dietitian  . Congestive Heart Failure Father     Review of Systems: The review of systems is per the HPI.  All other systems were reviewed and are negative.  Physical Exam: BP 140/72  Pulse 68  Ht 5' 8.5" (1.74 m)  Wt 201 lb 12.8 oz (91.536 kg)  BMI 30.23 kg/m2 Kurt Richard is very pleasant and in no acute distress. Kurt Richard does look better today - not as sickly. Skin is warm and dry. Color is normal.  HEENT is unremarkable. Normocephalic/atraumatic. PERRL. Sclera are nonicteric. Neck is supple. No masses. No JVD. Lungs are clear. Cardiac exam shows a regular rate and rhythm. Abdomen is soft. Extremities are without edema. Gait and ROM are intact. No gross neurologic deficits noted.  Wt  Readings from Last 3 Encounters:  12/04/12 201 lb 12.8 oz (91.536 kg)  11/14/12 199 lb 12.8 oz (90.629 kg)  10/06/12 195 lb 6.4 oz (88.633 kg)    LABORATORY DATA: Lab Results  Component Value Date   WBC 8.0 11/14/2012   HGB 11.5*  11/30/2012   HCT 33.0* 11/14/2012   PLT 273.0 11/14/2012   GLUCOSE 176* 11/14/2012   CHOL 220* 10/10/2012   TRIG 142.0 10/10/2012   HDL 31.20* 10/10/2012   LDLDIRECT 155.3 10/10/2012   LDLCALC 33 08/06/2011   ALT 9 10/06/2012   AST 13 10/06/2012   NA 142 11/14/2012   K 5.6* 11/14/2012   CL 107 11/14/2012   CREATININE 2.6* 11/14/2012   BUN 52* 11/14/2012   CO2 24 11/14/2012   TSH 1.44 12/14/2011   INR 1.08 08/01/2012   HGBA1C 6.6* 12/14/2011   Echo Study Conclusions from February 2014  - Left ventricle: The cavity size was normal. Wall thickness was increased in a pattern of moderate LVH. Systolic function was moderately reduced. The estimated ejection fraction was in the range of 35% to 40%. There is moderate hypokinesis of the mid-distalanteroseptal myocardium. Features are consistent with a pseudonormal left ventricular filling pattern, with concomitant abnormal relaxation and increased filling pressure (grade 2 diastolic dysfunction). - Aortic valve: There was very mild stenosis. Mean gradient: 7mm Hg (S). Peak gradient: 13mm Hg (S). - Mitral valve: Calcified annulus. Mildly thickened leaflets . - Left atrium: The atrium was moderately dilated. - Right ventricle: The cavity size was mildly dilated. - Right atrium: The atrium was mildly dilated.   Assessment / Plan:   1. Dyspnea - Lasix was increased - Kurt Richard has had improvement in Kurt Richard dyspnea. Will continue for now.   2. Chronic combined systolic and diastolic HF - looks better clinically. Weight is down at home. Recheck BMET today.   3. CAD - prior CABG - no chest pain reported.   4. Progressive CKD - followed by Dr. Hyman Hopes - will see what Kurt Richard labs look like - may need an earlier visit.   5. HTN -  BP much better - no change with current plan.   Kurt Richard is agreeable to this plan and will call if any problems develop in the interim.  Rosalio Macadamia, RN, ANP-C  [facility]  [address]  [place], [place] [address]

## 2012-12-05 ENCOUNTER — Ambulatory Visit (INDEPENDENT_AMBULATORY_CARE_PROVIDER_SITE_OTHER): Payer: Medicare Other | Admitting: Family Medicine

## 2012-12-05 DIAGNOSIS — Z2911 Encounter for prophylactic immunotherapy for respiratory syncytial virus (RSV): Secondary | ICD-10-CM

## 2012-12-05 DIAGNOSIS — Z23 Encounter for immunization: Secondary | ICD-10-CM

## 2012-12-06 ENCOUNTER — Encounter: Payer: Self-pay | Admitting: Nurse Practitioner

## 2012-12-06 ENCOUNTER — Encounter (HOSPITAL_COMMUNITY)
Admission: RE | Admit: 2012-12-06 | Discharge: 2012-12-06 | Disposition: A | Discharge status: Home / Self Care | Payer: Medicare Other | Source: Ambulatory Visit | Attending: Nephrology | Admitting: Nephrology

## 2012-12-06 ENCOUNTER — Other Ambulatory Visit: Payer: Self-pay | Admitting: *Deleted

## 2012-12-06 DIAGNOSIS — D649 Anemia, unspecified: Secondary | ICD-10-CM | POA: Diagnosis present

## 2012-12-06 DIAGNOSIS — N183 Chronic kidney disease, stage 3 unspecified: Secondary | ICD-10-CM | POA: Insufficient documentation

## 2012-12-06 DIAGNOSIS — E875 Hyperkalemia: Secondary | ICD-10-CM

## 2012-12-06 DIAGNOSIS — D631 Anemia in chronic kidney disease: Secondary | ICD-10-CM | POA: Diagnosis not present

## 2012-12-06 DIAGNOSIS — I129 Hypertensive chronic kidney disease with stage 1 through stage 4 chronic kidney disease, or unspecified chronic kidney disease: Secondary | ICD-10-CM | POA: Diagnosis not present

## 2012-12-06 LAB — POCT HEMOGLOBIN-HEMACUE: Hemoglobin: 11.3 g/dL — ABNORMAL LOW (ref 13.0–17.0)

## 2012-12-06 MED ORDER — EPOETIN ALFA 3000 UNIT/ML IJ SOLN
3000.0000 [IU] | INTRAMUSCULAR | Status: DC
  Filled 2012-12-06: qty 1

## 2012-12-06 MED ORDER — EPOETIN ALFA 10000 UNIT/ML IJ SOLN
INTRAMUSCULAR | Status: AC
  Administered 2012-12-06: 3000 [IU] via SUBCUTANEOUS
  Filled 2012-12-06: qty 1

## 2012-12-06 NOTE — Telephone Encounter (Signed)
New Problem:  Pt states he is calling for his test results. Please advise

## 2012-12-11 ENCOUNTER — Encounter: Payer: Self-pay | Admitting: Family Medicine

## 2012-12-12 ENCOUNTER — Encounter: Payer: Self-pay | Admitting: Cardiology

## 2012-12-13 ENCOUNTER — Telehealth: Payer: Self-pay | Admitting: Nurse Practitioner

## 2012-12-13 ENCOUNTER — Encounter (HOSPITAL_COMMUNITY)
Admission: RE | Admit: 2012-12-13 | Discharge: 2012-12-13 | Disposition: A | Discharge status: Home / Self Care | Payer: Medicare Other | Source: Ambulatory Visit | Attending: Nephrology | Admitting: Nephrology

## 2012-12-13 ENCOUNTER — Other Ambulatory Visit (INDEPENDENT_AMBULATORY_CARE_PROVIDER_SITE_OTHER): Payer: Medicare Other

## 2012-12-13 DIAGNOSIS — E875 Hyperkalemia: Secondary | ICD-10-CM

## 2012-12-13 DIAGNOSIS — I129 Hypertensive chronic kidney disease with stage 1 through stage 4 chronic kidney disease, or unspecified chronic kidney disease: Secondary | ICD-10-CM | POA: Diagnosis not present

## 2012-12-13 LAB — BASIC METABOLIC PANEL
BUN: 58 mg/dL — ABNORMAL HIGH (ref 6–23)
CO2: 20 mEq/L (ref 19–32)
Calcium: 8.4 mg/dL (ref 8.4–10.5)
Chloride: 103 mEq/L (ref 96–112)
Creatinine, Ser: 3 mg/dL — ABNORMAL HIGH (ref 0.4–1.5)
GFR: 22.65 mL/min — ABNORMAL LOW (ref >60.00)
Glucose, Bld: 154 mg/dL — ABNORMAL HIGH (ref 70–99)
Potassium: 4.8 mEq/L (ref 3.5–5.1)
Sodium: 137 mEq/L (ref 135–145)

## 2012-12-13 LAB — POCT HEMOGLOBIN-HEMACUE: Hemoglobin: 11.1 g/dL — ABNORMAL LOW (ref 13.0–17.0)

## 2012-12-13 LAB — FERRITIN: Ferritin: 214 ng/mL (ref 22–322)

## 2012-12-13 MED ORDER — EPOETIN ALFA 10000 UNIT/ML IJ SOLN
INTRAMUSCULAR | Status: AC
  Administered 2012-12-13: 3000 [IU] via SUBCUTANEOUS
  Filled 2012-12-13: qty 1

## 2012-12-13 MED ORDER — EPOETIN ALFA 3000 UNIT/ML IJ SOLN
3000.0000 [IU] | INTRAMUSCULAR | Status: DC
  Filled 2012-12-13: qty 1

## 2012-12-13 NOTE — Telephone Encounter (Signed)
New message      Pt states he is returning Danielle's call

## 2012-12-17 ENCOUNTER — Other Ambulatory Visit: Payer: Self-pay | Admitting: Cardiovascular Disease

## 2012-12-19 ENCOUNTER — Other Ambulatory Visit: Payer: Self-pay | Admitting: Family Medicine

## 2012-12-19 ENCOUNTER — Encounter: Payer: Self-pay | Admitting: Cardiovascular Disease

## 2012-12-19 ENCOUNTER — Other Ambulatory Visit (HOSPITAL_COMMUNITY): Payer: Self-pay | Admitting: *Deleted

## 2012-12-19 ENCOUNTER — Other Ambulatory Visit: Payer: Self-pay | Admitting: Cardiovascular Disease

## 2012-12-19 ENCOUNTER — Ambulatory Visit (INDEPENDENT_AMBULATORY_CARE_PROVIDER_SITE_OTHER): Payer: Medicare Other | Admitting: Cardiovascular Disease

## 2012-12-19 VITALS — BP 158/64 | HR 76 | Ht 68.5 in | Wt 201.4 lb

## 2012-12-19 DIAGNOSIS — I739 Peripheral vascular disease, unspecified: Secondary | ICD-10-CM

## 2012-12-19 NOTE — Progress Notes (Signed)
Primary cardiologist: Dr. Elease Hashimoto  HPI  This is a pleasant 67 year old man who is here today for a followup visit regarding peripheral arterial disease. The patient has known history of coronary artery disease status post anterior MI in 2013. He is status post CABG. He had severe LV systolic dysfunction at that time with gradual improvement. Most recent ejection fraction was 35-40%. He has multiple chronic medical conditions including type 2 diabetes, hypertension, hyperlipidemia and chronic kidney disease. Most recent creatinine was 3. He suffers from chronic area related to previous C. difficile colitis. He was evaluated in June, 2014  for severe claudication involving left calf discomfort with rest pain. His ABI was severely reduced on the left and moderately reduced on the right. I performed lower extremity angiography which showed: left : 90% focal stenosis in external iliac artery, long occlusion of SFA with 3 vessel run off . s/p slef expanding stent placement to external iliac artery. Right : borderline significant disease in comon and external iliac arteries. Diffuse 70-90% in SFA with 3 vessel run off.   He has been doing well since then with significant improvement in left leg claudication. He continues to have very mild bilateral calf claudication likely due to known SFA disease.  Allergies  Allergen Reactions  . Diltiazem Hcl Hives and Swelling  . Nifedipine Hives and Swelling     Current Outpatient Prescriptions on File Prior to Visit  Medication Sig Dispense Refill  . aspirin 81 MG EC tablet Take 1 tablet (81 mg total) by mouth daily.      [name] BIDIL 20-37.5 MG per tablet TAKE 1 TABLET BY MOUTH TWICE A DAY  60 tablet  5  . carvedilol (COREG) 25 MG tablet Take 25 mg by mouth 2 (two) times daily with a meal.      . carvedilol (COREG) 25 MG tablet TAKE 1 TABLET BY MOUTH TWICE A DAY WITH A MEAL  60 tablet  1  . clopidogrel (PLAVIX) 75 MG tablet Take 1 tablet (75 mg total) by mouth  daily.  30 tablet  11  . diphenoxylate-atropine (LOMOTIL) 2.5-0.025 MG per tablet TAKE 1 TABLET BY MOUTH 4 TIMES A DAY AS NEEDED FOR DIARRHEA OR LOOSE STOOLS  30 tablet  2  . epoetin alfa (EPOGEN,PROCRIT) 3000 UNIT/ML injection Inject 3,000 Units into the skin once.      . fluticasone (FLONASE) 50 MCG/ACT nasal spray Place 2 sprays into the nose daily.      . furosemide (LASIX) 40 MG tablet Take 40 mg by mouth daily.      . methimazole (TAPAZOLE) 5 MG tablet Take 5 mg by mouth 2 (two) times daily.      . nitroGLYCERIN (NITROSTAT) 0.4 MG SL tablet Place 0.4 mg under the tongue every 5 (five) minutes as needed for chest pain.      [name] oxyCODONE (OXY IR/ROXICODONE) 5 MG immediate release tablet Take 5 mg by mouth as needed for pain. Per Dr. Georgann Housekeeper      . paregoric 2 MG/5ML solution Take 5 mLs by mouth 4 (four) times daily as needed for diarrhea or loose stools.      . sodium bicarbonate 325 MG tablet Take 650 mg by mouth 2 (two) times daily.        No current facility-administered medications on file prior to visit.     Past Medical History  Diagnosis Date  . Diabetes mellitus   . Hypertension   . Chronic systolic heart failure  echo 04/12/11: Mild LVH, EF 30-35%, mid to distal anteroseptal and apical hypokinesis, grade 2 diastolic dysfunction, moderate LAE, mild RAE.  [name] Hyperlipidemia   . Tobacco abuse   . CAD (coronary artery disease)     acute anterior STEMI, late presentation 04/06/11 LHC 3/5 demonstrated severe ostial left main 95% stenosis and otherwise three-vessel CAD with an ejection fraction of 15%.  Emergent CABG: Dr. Dorris Fetch - Grafts: LIMA-LAD, SVG-D1, SVG-OM1, SVG-PDA and PL.  . Ischemic cardiomyopathy   . DM2 (diabetes mellitus, type 2)   . 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 Acute renal insufficiency     05/2011 admission (cr 1.54 at discharge)  . Anemia     Post-CABG  . Arthritis   . Stroke   . Sleep apnea   . Neuromuscular disorder     diabetic neuropathy   . Diarrhea     has had c diff diarrhea  . Weakness   . Bruises easily   . PAD (peripheral artery disease) 07/28/2012    Angiography 08/2012: Right: 90% focal stenosis in external iliac artery, long occlusion of SFA with 3 vessel run off . s/p slef expanding stent placement to external iliac artery. Left: borderline significant disease in comon and external iliac arteries. Diffuse 70-90% in SFA with 3 vessel run off.      Past Surgical History  Procedure Laterality Date  . Coronary artery bypass graft  04/06/2011    Procedure: CORONARY ARTERY BYPASS GRAFTING (CABG);  Surgeon: Loreli Slot, MD;  Location: [facility] OR;  Service: Open Heart Surgery;  Laterality: N/A;  Coronary Artery Bypass Graft times four utilizing the left internal mammary artery and the Right and left  greater Saphenous veins Harvested endoscopically.  . Fibroid bypass 04/06/11    . Colon surgery      rectal fissure  . Cervical disc surgery  1997 - approximate  . Colonoscopy  07/29/2011    Procedure: COLONOSCOPY;  Surgeon: Rachael Fee, MD;  Location: [facility];  Service: Endoscopy;  Laterality: N/A;  . Eye surgery  2011    cataract removal     Family History  Problem Relation Age of Onset  . Heart attack Mother     ?66s  . Diabetes Mother   . Heart attack Sister     65s  . Diabetes Sister     Dietitian  . Congestive Heart Failure Father      History   Social History  . Marital Status: Single    Spouse Name: N/A    Number of Children: 0  . Years of Education: N/A   Occupational History  . Not on file.   Social History Main Topics  . Smoking status: Former Smoker -- 0.50 packs/day    Types: Cigarettes    Quit date: 04/06/2011  . Smokeless tobacco: Never Used  . Alcohol Use: No  . Drug Use: No  . Sexual Activity: No   Other Topics Concern  . Not on file   Social History Narrative  . No narrative on file       PHYSICAL EXAM   BP 158/64  Pulse 76  Ht 5' 8.5" (1.74 m)  Wt 201 lb  6.4 oz (91.354 kg)  BMI 30.17 kg/m2 Constitutional: He is oriented to person, place, and time. He appears well-developed and well-nourished. No distress.  HENT: No nasal discharge.  Head: Normocephalic and atraumatic.  Eyes: Pupils are equal and round. Right eye exhibits no discharge. Left eye  exhibits no discharge.  Neck: Normal range of motion. Neck supple. No JVD present. No thyromegaly present.  Cardiovascular: Normal rate, regular rhythm, normal heart sounds and. Exam reveals no gallop and no friction rub. No murmur heard.  Pulmonary/Chest: Effort normal and breath sounds normal. No stridor. No respiratory distress. He has no wheezes. He has no rales. He exhibits no tenderness.  Abdominal: Soft. Bowel sounds are normal. He exhibits no distension. There is no tenderness. There is no rebound and no guarding.  Musculoskeletal: Normal range of motion. He exhibits mild edema and no tenderness.  Neurological: He is alert and oriented to person, place, and time. Coordination normal.  Skin: Skin is warm and dry. No rash noted. He is not diaphoretic. No erythema. No pallor.  Psychiatric: He has a normal mood and affect. His behavior is normal. Judgment and thought content normal.  Vascular:  Femoral pulse: Diminished on the right side and close to normal on the left side.  Distal pulses are not palpable.      ASSESSMENT AND PLAN

## 2012-12-19 NOTE — Telephone Encounter (Signed)
Last visit 03/17/12

## 2012-12-19 NOTE — Assessment & Plan Note (Signed)
He is doing very well at this time with only mild bilateral calf claudication. Recent noninvasive evaluation showed stable ABI on the left side and slight worsening on the right side. The stent in the left external iliac artery was widely patent. Continue medical therapy. I explained to him that he was antiplatelet therapy is option at this point. However, due to his extensive cardiovascular disease, I think he would benefit from being on long-term no antiplatelet therapy as long as tolerated.

## 2012-12-19 NOTE — Telephone Encounter (Signed)
Refill for 6 months but he needs follow up to reassess thyroid function.

## 2012-12-19 NOTE — Patient Instructions (Addendum)
Your physician wants you to follow-up in: 6 months with Dr. Arida. You will receive a reminder letter in the mail two months in advance. If you don't receive a letter, please call our office to schedule the follow-up appointment.  Your physician recommends that you continue on your current medications as directed. Please refer to the Current Medication list given to you today.   

## 2012-12-20 ENCOUNTER — Encounter (HOSPITAL_COMMUNITY)
Admission: RE | Admit: 2012-12-20 | Discharge: 2012-12-20 | Disposition: A | Discharge status: Home / Self Care | Payer: Medicare Other | Source: Ambulatory Visit | Attending: Nephrology | Admitting: Nephrology

## 2012-12-20 ENCOUNTER — Other Ambulatory Visit: Payer: Self-pay

## 2012-12-20 DIAGNOSIS — I129 Hypertensive chronic kidney disease with stage 1 through stage 4 chronic kidney disease, or unspecified chronic kidney disease: Secondary | ICD-10-CM | POA: Diagnosis not present

## 2012-12-20 MED ORDER — EPOETIN ALFA 10000 UNIT/ML IJ SOLN
INTRAMUSCULAR | Status: AC
  Administered 2012-12-20: 13:00:00 3000 [IU] via SUBCUTANEOUS
  Filled 2012-12-20: qty 1

## 2012-12-20 MED ORDER — EPOETIN ALFA 3000 UNIT/ML IJ SOLN
3000.0000 [IU] | INTRAMUSCULAR | Status: DC
  Filled 2012-12-20: qty 1

## 2012-12-27 ENCOUNTER — Encounter (HOSPITAL_COMMUNITY)
Admission: RE | Admit: 2012-12-27 | Discharge: 2012-12-27 | Disposition: A | Discharge status: Home / Self Care | Payer: Medicare Other | Source: Ambulatory Visit | Attending: Nephrology | Admitting: Nephrology

## 2012-12-27 ENCOUNTER — Other Ambulatory Visit: Payer: Self-pay | Admitting: Cardiovascular Disease

## 2012-12-27 DIAGNOSIS — I129 Hypertensive chronic kidney disease with stage 1 through stage 4 chronic kidney disease, or unspecified chronic kidney disease: Secondary | ICD-10-CM | POA: Diagnosis not present

## 2012-12-27 LAB — POCT HEMOGLOBIN-HEMACUE: Hemoglobin: 11.1 g/dL — ABNORMAL LOW (ref 13.0–17.0)

## 2012-12-27 MED ORDER — EPOETIN ALFA 3000 UNIT/ML IJ SOLN
3000.0000 [IU] | INTRAMUSCULAR | Status: DC
  Filled 2012-12-27: qty 1

## 2012-12-27 MED ORDER — EPOETIN ALFA 10000 UNIT/ML IJ SOLN
INTRAMUSCULAR | Status: AC
  Administered 2012-12-27: 11:00:00 10000 [IU] via SUBCUTANEOUS
  Filled 2012-12-27: qty 1

## 2012-12-29 ENCOUNTER — Other Ambulatory Visit: Payer: Self-pay

## 2012-12-29 MED ORDER — FUROSEMIDE 40 MG PO TABS: 40.0000 mg | ORAL_TABLET | Freq: Every day | ORAL | Status: DC

## 2013-01-01 ENCOUNTER — Other Ambulatory Visit: Payer: Self-pay | Admitting: Family Medicine

## 2013-01-01 ENCOUNTER — Other Ambulatory Visit: Payer: Self-pay | Admitting: Cardiovascular Disease

## 2013-01-04 ENCOUNTER — Ambulatory Visit (INDEPENDENT_AMBULATORY_CARE_PROVIDER_SITE_OTHER): Payer: Medicare Other | Admitting: Cardiovascular Disease

## 2013-01-04 ENCOUNTER — Encounter: Payer: Self-pay | Admitting: Cardiovascular Disease

## 2013-01-04 ENCOUNTER — Encounter (HOSPITAL_COMMUNITY)
Admission: RE | Admit: 2013-01-04 | Discharge: 2013-01-04 | Disposition: A | Discharge status: Home / Self Care | Payer: Medicare Other | Source: Ambulatory Visit | Attending: Nephrology | Admitting: Nephrology

## 2013-01-04 VITALS — BP 164/77 | HR 75 | Ht 68.5 in | Wt 204.8 lb

## 2013-01-04 DIAGNOSIS — D631 Anemia in chronic kidney disease: Secondary | ICD-10-CM | POA: Insufficient documentation

## 2013-01-04 DIAGNOSIS — N183 Chronic kidney disease, stage 3 unspecified: Secondary | ICD-10-CM | POA: Insufficient documentation

## 2013-01-04 DIAGNOSIS — I5022 Chronic systolic (congestive) heart failure: Secondary | ICD-10-CM

## 2013-01-04 DIAGNOSIS — I2510 Atherosclerotic heart disease of native coronary artery without angina pectoris: Secondary | ICD-10-CM

## 2013-01-04 DIAGNOSIS — I129 Hypertensive chronic kidney disease with stage 1 through stage 4 chronic kidney disease, or unspecified chronic kidney disease: Secondary | ICD-10-CM | POA: Insufficient documentation

## 2013-01-04 DIAGNOSIS — D649 Anemia, unspecified: Secondary | ICD-10-CM | POA: Diagnosis present

## 2013-01-04 MED ORDER — EPOETIN ALFA 3000 UNIT/ML IJ SOLN
3000.0000 [IU] | INTRAMUSCULAR | Status: DC
  Filled 2013-01-04: qty 1

## 2013-01-04 MED ORDER — EPOETIN ALFA 10000 UNIT/ML IJ SOLN
INTRAMUSCULAR | Status: AC
  Administered 2013-01-04: 3000 [IU] via SUBCUTANEOUS
  Filled 2013-01-04: qty 1

## 2013-01-04 NOTE — Assessment & Plan Note (Signed)
He has a history of chronic systolic congestive heart failure. At this point, I think most of his problems are related to his renal failure. His creatinine was 3.0 back in November. He was seen by Dr. Hyman Hopes recently and was told he may need to start dialysis next year.  I'll see him again in 6 months for followup visit.

## 2013-01-04 NOTE — Assessment & Plan Note (Signed)
He denies any episodes of chest pain. His coronary artery disease appears to be stable.

## 2013-01-04 NOTE — Progress Notes (Signed)
Kurt Richard Date of Birth  02/16/1945 [facility]     [place] [facility]  [address]    [address]   [address] [place], [place]  [address]    [place], [place]  [address] [phone]  Fax  [phone]  [phone]  Fax [phone]  Problem list: 1. Acute on chronic systolic CHF  Echo Feb. 2014 Left ventricle: The cavity size was normal.  moderate LVH.  The estimated ejection fraction was in the range of 35% to 40%. There is moderate hypokinesis of the mid-distalanteroseptal myocardium.  pseudonormal left ventricular filling pattern, with concomitant abnormal relaxation and increased filling pressure (grade 2 diastolic dysfunction). - Aortic valve: There was very mild stenosis. Mean gradient:39mm Hg (S). Peak gradient: 16mm Hg (S). - Mitral valve: Calcified annulus. Mildly thickened leaflets .- Left atrium: The atrium was moderately dilated. - Right ventricle: The cavity size was mildly dilated. - Right atrium: The atrium was mildly dilated.  2. C. Diff colitis. 3. Acute renal insufficiency, d/c Cr 1.54  4. CAD - recent late-presenting anterior MI 04/2011 with 3V dz s/p CABG   - postop course 04/2011 complicated by LV dysfunction requiring IABP/inotropes, ABL anemia, CVA  5.  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 Diabetes mellitus  7. Tobacco abuse  8. hypertension 9. hyperlipidemia  History of Present Illness:  Kurt Richard is a 67 year old gentleman with the above-noted medical history.  He is still having problems with his abdomen and the diarrhea.  The last stool sample was negative for c. Diff. He needs to have another ERCP at [facility]. He has done well from a cardiac standpoint.  No further exacerbations of CHf.  No angina.  April 13, 2012: [name] is doing better.  He saw [name] for pre-op evaluation prior gallbladder surgery. He had an echocardiogram which revealed some improvement of his left ventricular systolic function. His ejection fraction is now 35-40%.  His  preop labs also revealed that he has chronic renal insufficiency with a creatinine of 2.3. His BUN is also elevated. In addition, he also has anemia.  His Lasix was decreased to 40 mg 3 times a week after seeing his elevated creatinine.  His breathing remains the same. He occasionally will take an extra Lasix if he develops chest congestion.  Jun 29, 2012:  He's not having episodes of chest pain or shortness of breath. Adma is making some slow progress.  He is not able to exercise because of left calf pain. He is only able to stand on his legs for several minutes.  He also has neuropathy which limits his exercise. He has not been eating any extra salt.   His baseline creatinine is 2.2.  He does not want to start on dialysis.    Sept. 5, 2014:  He has done well.  His Hb is still low.  He goes for Procrit injection every week and iron infusions on occasion.  No obvious blood in his stool.  He has had peripheral arterial evaluation and stenting of his left iliac artery:  Conclusions:  1. Severe focal stenosis in the distal left external iliac artery. Moderate to significant diffuse disease in the distal right common iliac artery and right external iliac artery.  2. Occluded left SFA in a long segment with collaterals from the profunda. Three-vessel runoff below the knee.  3. Diffusely diseased right SFA throughout its course with three-vessel runoff below the knee.  4. Successful self-expanding stent placement to the left external iliac artery.  He was started back on the plavix.  He still eats an unrestricted diet - still eats carbs and fats as well as some extra salt.   Dec. 4, 2014:  [name] is doing well.  BP at home is normal.  No CP or dyspnea.   He's not eating much salt. He avoids hot dogs, and other salty foods. He has seen Dr. Hyman Hopes recently.  He received Procrit weekly.   They have discussed starting dialysis.   Current Outpatient Prescriptions on File Prior to Visit   Medication Sig Dispense Refill  . aspirin 81 MG EC tablet Take 1 tablet (81 mg total) by mouth daily.      [name] BIDIL 20-37.5 MG per tablet TAKE 1 TABLET BY MOUTH TWICE A DAY  60 tablet  5  . calcitRIOL (ROCALTROL) 0.25 MCG capsule Take 0.25 mcg by mouth daily. PATIENT ONLY TAKES 3 DAYS A WEEK      . carvedilol (COREG) 25 MG tablet TAKE 1 TABLET BY MOUTH TWICE A DAY WITH A MEAL  60 tablet  5  . clopidogrel (PLAVIX) 75 MG tablet Take 1 tablet (75 mg total) by mouth daily.  30 tablet  11  . diphenoxylate-atropine (LOMOTIL) 2.5-0.025 MG per tablet TAKE 1 TABLET BY MOUTH 4 TIMES A DAY AS NEEDED FOR DIARRHEA OR LOOSE STOOLS, NEED OFFICE VISIT  30 tablet  5  . epoetin alfa (EPOGEN,PROCRIT) 3000 UNIT/ML injection Inject 3,000 Units into the skin once.      . fluticasone (FLONASE) 50 MCG/ACT nasal spray Place 2 sprays into the nose daily.      . furosemide (LASIX) 40 MG tablet Take 1 tablet (40 mg total) by mouth daily.  30 tablet  6  . methimazole (TAPAZOLE) 5 MG tablet Take 5 mg by mouth 2 (two) times daily.      . methimazole (TAPAZOLE) 5 MG tablet TAKE 1 TO 2 TABLETS TWICE DAILY  30 tablet  5  . nitroGLYCERIN (NITROSTAT) 0.4 MG SL tablet Place 0.4 mg under the tongue every 5 (five) minutes as needed for chest pain.      [name] oxyCODONE (OXY IR/ROXICODONE) 5 MG immediate release tablet Take 5 mg by mouth as needed for pain. Per Dr. Georgann Housekeeper      . paregoric 2 MG/5ML solution Take 5 mLs by mouth 4 (four) times daily as needed for diarrhea or loose stools.      . sodium bicarbonate 325 MG tablet Take 650 mg by mouth 2 (two) times daily.        No current facility-administered medications on file prior to visit.    Allergies  Allergen Reactions  . Diltiazem Hcl Hives and Swelling  . Nifedipine Hives and Swelling    Past Medical History  Diagnosis Date  . Diabetes mellitus   . Hypertension   . Chronic systolic heart failure     echo 04/12/11: Mild LVH, EF 30-35%, mid to distal anteroseptal and apical  hypokinesis, grade 2 diastolic dysfunction, moderate LAE, mild RAE.  [name] Hyperlipidemia   . Tobacco abuse   . CAD (coronary artery disease)     acute anterior STEMI, late presentation 04/06/11 LHC 3/5 demonstrated severe ostial left main 95% stenosis and otherwise three-vessel CAD with an ejection fraction of 15%.  Emergent CABG: Dr. Dorris Fetch - Grafts: LIMA-LAD, SVG-D1, SVG-OM1, SVG-PDA and PL.  . Ischemic cardiomyopathy   . DM2 (diabetes mellitus, type 2)   . 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 Acute renal  insufficiency     05/2011 admission (cr 1.54 at discharge)  . Anemia     Post-CABG  . Arthritis   . Stroke   . Sleep apnea   . Neuromuscular disorder     diabetic neuropathy  . Diarrhea     has had c diff diarrhea  . Weakness   . Bruises easily   . PAD (peripheral artery disease) 07/28/2012    Angiography 08/2012: Right: 90% focal stenosis in external iliac artery, long occlusion of SFA with 3 vessel run off . s/p slef expanding stent placement to external iliac artery. Left: borderline significant disease in comon and external iliac arteries. Diffuse 70-90% in SFA with 3 vessel run off.     Past Surgical History  Procedure Laterality Date  . Coronary artery bypass graft  04/06/2011    Procedure: CORONARY ARTERY BYPASS GRAFTING (CABG);  Surgeon: Loreli Slot, MD;  Location: [facility];  Service: Open Heart Surgery;  Laterality: N/A;  Coronary Artery Bypass Graft times four utilizing the left internal mammary artery and the Right and left  greater Saphenous veins Harvested endoscopically.  . Fibroid bypass 04/06/11    . Colon surgery      rectal fissure  . Cervical disc surgery  1997 - approximate  . Colonoscopy  07/29/2011    Procedure: COLONOSCOPY;  Surgeon: Rachael Fee, MD;  Location: [facility] ENDOSCOPY;  Service: Endoscopy;  Laterality: N/A;  . Eye surgery  2011    cataract removal    History  Smoking status  . Former Smoker -- 0.50 packs/day  . Types: Cigarettes   . Quit date: 04/06/2011  Smokeless tobacco  . Never Used    History  Alcohol Use No    Family History  Problem Relation Age of Onset  . Heart attack Mother     ?75s  . Diabetes Mother   . Heart attack Sister     78s  . Diabetes Sister     Dietitian  . Congestive Heart Failure Father     Reviw of Systems:  Reviewed in the HPI.  All other systems are negative.  Physical Exam: Blood pressure 164/77, pulse 75, height 5' 8.5" (1.74 m), weight 204 lb 12.8 oz (92.897 kg). General: Well developed, well nourished, in no acute distress.  He appears to be generally stronger than he was during his last several visits. Head: Normocephalic, atraumatic, sclera non-icteric, mucus membranes are moist,   Neck: Supple. Carotids are 2 + without bruits. No JVD  Lungs: Clear bilaterally to auscultation.  Heart: regular rate.  normal  S1 S2. No murmurs, gallops or rubs.    Abdomen: Soft, non-tender, non-distended with normal bowel sounds. No hepatomegaly. No rebound/guarding. No masses.  Msk:  Strength and tone are normal  Extremities: No clubbing or cyanosis. 1 + bilateral edema.  Distal pedal pulses are 2+ and equal bilaterally.  Neuro: Alert and oriented X 3. Moves all extremities spontaneously.  Psych:  Responds to questions appropriately with a normal affect.  ECG:  Assessment / Plan:

## 2013-01-04 NOTE — Patient Instructions (Addendum)
Your physician wants you to follow-up in: 6 MONTHS You will receive a reminder letter in the mail two months in advance. If you don't receive a letter, please call our office to schedule the follow-up appointment.  I have recommended that you elevate your legs.  An ideal way is to get a [employer] Leg rest - invented by one of our local vascular surgeons.  Website:  [email or web address]      \

## 2013-01-11 ENCOUNTER — Encounter (HOSPITAL_COMMUNITY)
Admission: RE | Admit: 2013-01-11 | Discharge: 2013-01-11 | Disposition: A | Discharge status: Home / Self Care | Payer: Medicare Other | Source: Ambulatory Visit | Attending: Nephrology | Admitting: Nephrology

## 2013-01-11 DIAGNOSIS — D631 Anemia in chronic kidney disease: Secondary | ICD-10-CM | POA: Diagnosis not present

## 2013-01-11 LAB — POCT HEMOGLOBIN-HEMACUE: Hemoglobin: 10.8 g/dL — ABNORMAL LOW (ref 13.0–17.0)

## 2013-01-11 MED ORDER — EPOETIN ALFA 3000 UNIT/ML IJ SOLN
3000.0000 [IU] | INTRAMUSCULAR | Status: DC
  Administered 2013-01-11: 14:00:00 3000 [IU] via SUBCUTANEOUS
  Filled 2013-01-11: qty 1

## 2013-01-11 MED ORDER — EPOETIN ALFA 10000 UNIT/ML IJ SOLN
INTRAMUSCULAR | Status: AC
  Filled 2013-01-11: qty 1

## 2013-01-16 MED FILL — Epoetin Alfa Inj 10000 Unit/ML: INTRAMUSCULAR | Qty: 1 | Status: AC

## 2013-01-17 ENCOUNTER — Other Ambulatory Visit: Payer: Self-pay | Admitting: Family Medicine

## 2013-01-18 ENCOUNTER — Encounter (HOSPITAL_COMMUNITY)
Admission: RE | Admit: 2013-01-18 | Discharge: 2013-01-18 | Disposition: A | Discharge status: Home / Self Care | Payer: Medicare Other | Source: Ambulatory Visit | Attending: Nephrology | Admitting: Nephrology

## 2013-01-18 DIAGNOSIS — D631 Anemia in chronic kidney disease: Secondary | ICD-10-CM | POA: Diagnosis not present

## 2013-01-18 LAB — POCT HEMOGLOBIN-HEMACUE: Hemoglobin: 10.2 g/dL — ABNORMAL LOW (ref 13.0–17.0)

## 2013-01-18 MED ORDER — EPOETIN ALFA 10000 UNIT/ML IJ SOLN
INTRAMUSCULAR | Status: AC
  Filled 2013-01-18: qty 1

## 2013-01-18 MED ORDER — EPOETIN ALFA 3000 UNIT/ML IJ SOLN
3000.0000 [IU] | INTRAMUSCULAR | Status: DC
  Filled 2013-01-18: qty 1

## 2013-01-19 LAB — FERRITIN: Ferritin: 207 ng/mL (ref 22–322)

## 2013-01-19 LAB — IRON AND TIBC: TIBC: 258 ug/dL (ref 215–435)

## 2013-01-22 MED FILL — Epoetin Alfa Inj 10000 Unit/ML: INTRAMUSCULAR | Qty: 1 | Status: AC

## 2013-01-26 ENCOUNTER — Encounter (HOSPITAL_COMMUNITY)
Admission: RE | Admit: 2013-01-26 | Discharge: 2013-01-26 | Disposition: A | Discharge status: Home / Self Care | Payer: Medicare Other | Source: Ambulatory Visit | Attending: Nephrology | Admitting: Nephrology

## 2013-01-26 DIAGNOSIS — N183 Chronic kidney disease, stage 3 unspecified: Secondary | ICD-10-CM | POA: Insufficient documentation

## 2013-01-26 DIAGNOSIS — I129 Hypertensive chronic kidney disease with stage 1 through stage 4 chronic kidney disease, or unspecified chronic kidney disease: Secondary | ICD-10-CM | POA: Insufficient documentation

## 2013-01-26 DIAGNOSIS — D631 Anemia in chronic kidney disease: Secondary | ICD-10-CM | POA: Diagnosis not present

## 2013-01-26 DIAGNOSIS — D649 Anemia, unspecified: Secondary | ICD-10-CM | POA: Diagnosis present

## 2013-01-26 LAB — POCT HEMOGLOBIN-HEMACUE: Hemoglobin: 10.9 g/dL — ABNORMAL LOW (ref 13.0–17.0)

## 2013-01-26 MED ORDER — EPOETIN ALFA 3000 UNIT/ML IJ SOLN
3000.0000 [IU] | INTRAMUSCULAR | Status: DC
  Filled 2013-01-26: qty 1

## 2013-01-26 MED ORDER — EPOETIN ALFA 10000 UNIT/ML IJ SOLN
INTRAMUSCULAR | Status: AC
  Administered 2013-01-26: 3000 [IU] via SUBCUTANEOUS
  Filled 2013-01-26: qty 1

## 2013-01-31 ENCOUNTER — Other Ambulatory Visit: Payer: Self-pay | Admitting: Family Medicine

## 2013-02-05 ENCOUNTER — Encounter (HOSPITAL_COMMUNITY)
Admission: RE | Admit: 2013-02-05 | Discharge: 2013-02-05 | Disposition: A | Discharge status: Home / Self Care | Payer: Medicare Other | Source: Ambulatory Visit | Attending: Nephrology | Admitting: Nephrology

## 2013-02-05 DIAGNOSIS — D649 Anemia, unspecified: Secondary | ICD-10-CM | POA: Insufficient documentation

## 2013-02-05 MED ORDER — EPOETIN ALFA 3000 UNIT/ML IJ SOLN
3000.0000 [IU] | INTRAMUSCULAR | Status: DC
  Filled 2013-02-05: qty 1

## 2013-02-05 MED ORDER — EPOETIN ALFA 10000 UNIT/ML IJ SOLN
INTRAMUSCULAR | Status: AC
  Administered 2013-02-05: 10:00:00 10000 [IU] via SUBCUTANEOUS
  Filled 2013-02-05: qty 1

## 2013-02-06 ENCOUNTER — Other Ambulatory Visit: Payer: Self-pay | Admitting: Family Medicine

## 2013-02-06 LAB — POCT HEMOGLOBIN-HEMACUE: Hemoglobin: 11 g/dL — ABNORMAL LOW (ref 13.0–17.0)

## 2013-02-10 ENCOUNTER — Other Ambulatory Visit: Payer: Self-pay | Admitting: Family Medicine

## 2013-02-12 ENCOUNTER — Encounter (HOSPITAL_COMMUNITY)
Admission: RE | Admit: 2013-02-12 | Discharge: 2013-02-12 | Disposition: A | Discharge status: Home / Self Care | Payer: Medicare Other | Source: Ambulatory Visit | Attending: Nephrology | Admitting: Nephrology

## 2013-02-12 LAB — POCT HEMOGLOBIN-HEMACUE: HEMOGLOBIN: 10.9 g/dL — AB (ref 13.0–17.0)

## 2013-02-12 MED ORDER — EPOETIN ALFA 3000 UNIT/ML IJ SOLN
3000.0000 [IU] | INTRAMUSCULAR | Status: DC
  Filled 2013-02-12: qty 1

## 2013-02-12 MED ORDER — EPOETIN ALFA 10000 UNIT/ML IJ SOLN
INTRAMUSCULAR | Status: AC
  Administered 2013-02-12: 12:00:00 3000 [IU]
  Filled 2013-02-12: qty 1

## 2013-02-13 ENCOUNTER — Other Ambulatory Visit: Payer: Self-pay | Admitting: Family Medicine

## 2013-02-19 ENCOUNTER — Other Ambulatory Visit: Payer: Self-pay | Admitting: Family Medicine

## 2013-02-21 ENCOUNTER — Encounter (HOSPITAL_COMMUNITY)
Admission: RE | Admit: 2013-02-21 | Discharge: 2013-02-21 | Disposition: A | Discharge status: Home / Self Care | Payer: Medicare Other | Source: Ambulatory Visit | Attending: Nephrology | Admitting: Nephrology

## 2013-02-21 LAB — FERRITIN: Ferritin: 219 ng/mL (ref 22–322)

## 2013-02-21 LAB — IRON AND TIBC
Iron: 58 ug/dL (ref 42–135)
Saturation Ratios: 23 % (ref 20–55)
TIBC: 256 ug/dL (ref 215–435)
UIBC: 198 ug/dL (ref 125–400)

## 2013-02-21 LAB — POCT HEMOGLOBIN-HEMACUE: Hemoglobin: 11.2 g/dL — ABNORMAL LOW (ref 13.0–17.0)

## 2013-02-21 MED ORDER — EPOETIN ALFA 3000 UNIT/ML IJ SOLN
3000.0000 [IU] | INTRAMUSCULAR | Status: DC
  Administered 2013-02-21: 13:00:00 3000 [IU] via SUBCUTANEOUS
  Filled 2013-02-21: qty 1

## 2013-02-21 MED ORDER — EPOETIN ALFA 10000 UNIT/ML IJ SOLN
INTRAMUSCULAR | Status: AC
  Filled 2013-02-21: qty 1

## 2013-02-22 MED FILL — Epoetin Alfa Inj 10000 Unit/ML: INTRAMUSCULAR | Qty: 1 | Status: AC

## 2013-02-23 ENCOUNTER — Other Ambulatory Visit (HOSPITAL_COMMUNITY): Payer: Self-pay | Admitting: *Deleted

## 2013-02-23 DIAGNOSIS — I739 Peripheral vascular disease, unspecified: Secondary | ICD-10-CM

## 2013-02-26 ENCOUNTER — Other Ambulatory Visit: Payer: Self-pay | Admitting: Family Medicine

## 2013-02-28 ENCOUNTER — Encounter (HOSPITAL_COMMUNITY)
Admission: RE | Admit: 2013-02-28 | Discharge: 2013-02-28 | Disposition: A | Discharge status: Home / Self Care | Payer: Medicare Other | Source: Ambulatory Visit | Attending: Nephrology | Admitting: Nephrology

## 2013-02-28 LAB — POCT HEMOGLOBIN-HEMACUE: Hemoglobin: 11.2 g/dL — ABNORMAL LOW (ref 13.0–17.0)

## 2013-02-28 MED ORDER — EPOETIN ALFA 10000 UNIT/ML IJ SOLN
INTRAMUSCULAR | Status: AC
  Administered 2013-02-28: 3000 [IU]
  Filled 2013-02-28: qty 1

## 2013-02-28 MED ORDER — EPOETIN ALFA 3000 UNIT/ML IJ SOLN
3000.0000 [IU] | INTRAMUSCULAR | Status: DC
  Filled 2013-02-28: qty 1

## 2013-03-01 ENCOUNTER — Encounter: Payer: Self-pay | Admitting: Cardiology

## 2013-03-01 ENCOUNTER — Ambulatory Visit (HOSPITAL_COMMUNITY): Payer: Medicare Other | Attending: Cardiology

## 2013-03-01 DIAGNOSIS — Z951 Presence of aortocoronary bypass graft: Secondary | ICD-10-CM | POA: Insufficient documentation

## 2013-03-01 DIAGNOSIS — E119 Type 2 diabetes mellitus without complications: Secondary | ICD-10-CM | POA: Insufficient documentation

## 2013-03-01 DIAGNOSIS — I700 Atherosclerosis of aorta: Secondary | ICD-10-CM

## 2013-03-01 DIAGNOSIS — I10 Essential (primary) hypertension: Secondary | ICD-10-CM | POA: Insufficient documentation

## 2013-03-01 DIAGNOSIS — E785 Hyperlipidemia, unspecified: Secondary | ICD-10-CM | POA: Insufficient documentation

## 2013-03-01 DIAGNOSIS — I739 Peripheral vascular disease, unspecified: Secondary | ICD-10-CM

## 2013-03-01 DIAGNOSIS — I2510 Atherosclerotic heart disease of native coronary artery without angina pectoris: Secondary | ICD-10-CM | POA: Insufficient documentation

## 2013-03-01 DIAGNOSIS — Z87891 Personal history of nicotine dependence: Secondary | ICD-10-CM | POA: Insufficient documentation

## 2013-03-05 ENCOUNTER — Other Ambulatory Visit: Payer: Self-pay | Admitting: Cardiovascular Disease

## 2013-03-07 ENCOUNTER — Encounter (HOSPITAL_COMMUNITY)
Admission: RE | Admit: 2013-03-07 | Discharge: 2013-03-07 | Disposition: A | Discharge status: Home / Self Care | Payer: Medicare Other | Source: Ambulatory Visit | Attending: Nephrology | Admitting: Nephrology

## 2013-03-07 DIAGNOSIS — D638 Anemia in other chronic diseases classified elsewhere: Secondary | ICD-10-CM | POA: Insufficient documentation

## 2013-03-07 DIAGNOSIS — N184 Chronic kidney disease, stage 4 (severe): Secondary | ICD-10-CM | POA: Insufficient documentation

## 2013-03-07 MED ORDER — EPOETIN ALFA 3000 UNIT/ML IJ SOLN
3000.0000 [IU] | INTRAMUSCULAR | Status: DC
  Filled 2013-03-07: qty 1

## 2013-03-07 MED ORDER — EPOETIN ALFA 10000 UNIT/ML IJ SOLN
INTRAMUSCULAR | Status: AC
  Administered 2013-03-07: 3000 [IU]
  Filled 2013-03-07: qty 1

## 2013-03-08 LAB — POCT HEMOGLOBIN-HEMACUE: HEMOGLOBIN: 10.2 g/dL — AB (ref 13.0–17.0)

## 2013-03-09 IMAGING — CT CT ABD-PELV W/ CM
2 of 5 series · 17 of 46 positions shown, 19 images · IV contrast (omnipaque)
Comparison: None.

CLINICAL DATA: Left lower quadrant pain.  Nausea.  Diarrhea.

CT ABDOMEN AND PELVIS WITH CONTRAST
TECHNIQUE: Multidetector CT imaging of the abdomen and pelvis was
performed following the standard protocol during bolus
administration of intravenous contrast.
Contrast: 100mL OMNIPAQUE IOHEXOL 300 MG/ML  SOLN

[Series 2: abd/ pel 5mm · axial · 0.70mm/px · z∈[-466,-40]mm · 14 of 95 slices shown, 16 images]
[im 5/95  soft-tissue]
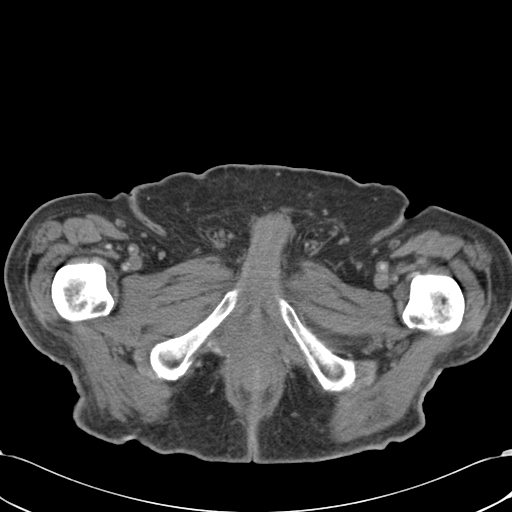
[im 5/95  bone]
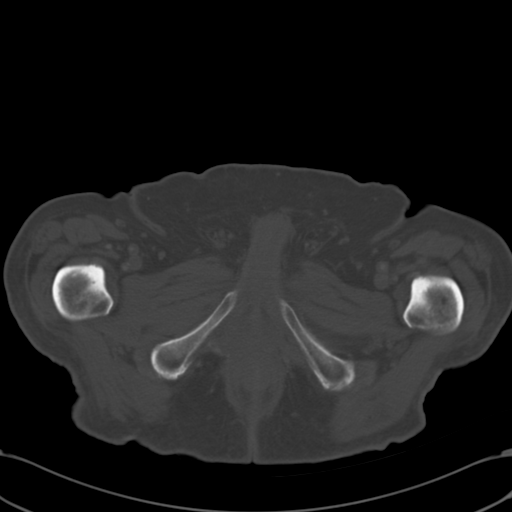
[im 10/95  soft-tissue]
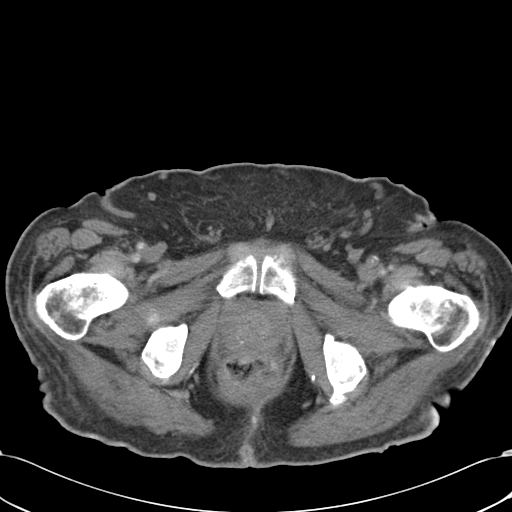
[im 20/95  soft-tissue]
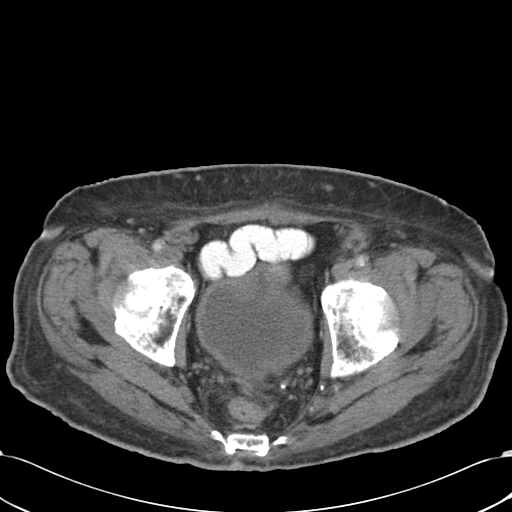
[im 25/95  soft-tissue]
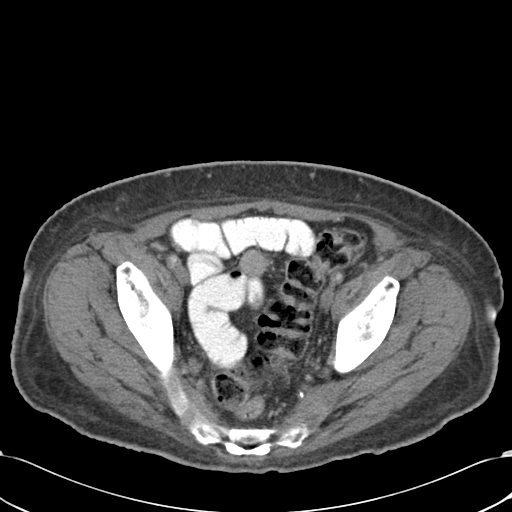
[im 30/95  soft-tissue]
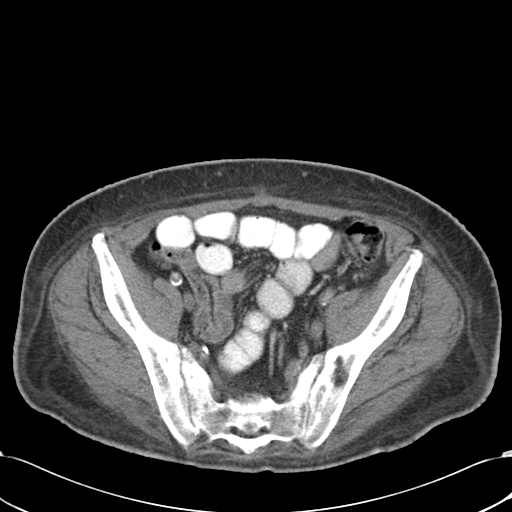
[im 40/95  soft-tissue]
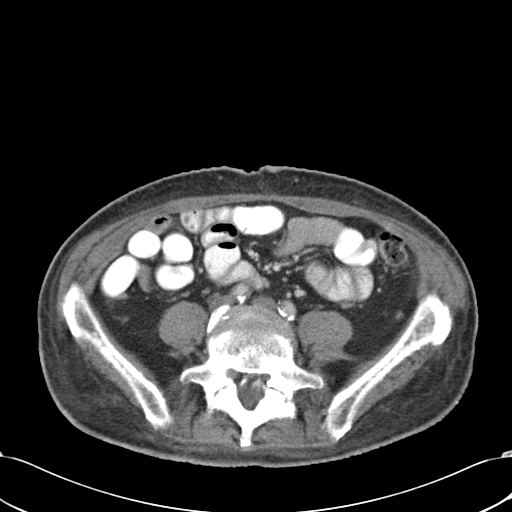
[im 45/95  soft-tissue]
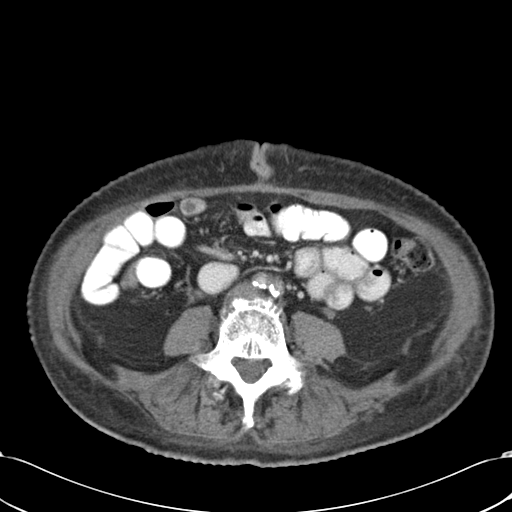
[im 50/95  soft-tissue]
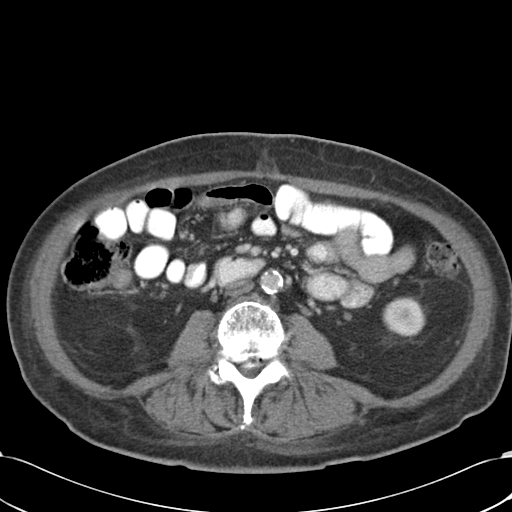
[im 55/95  soft-tissue]
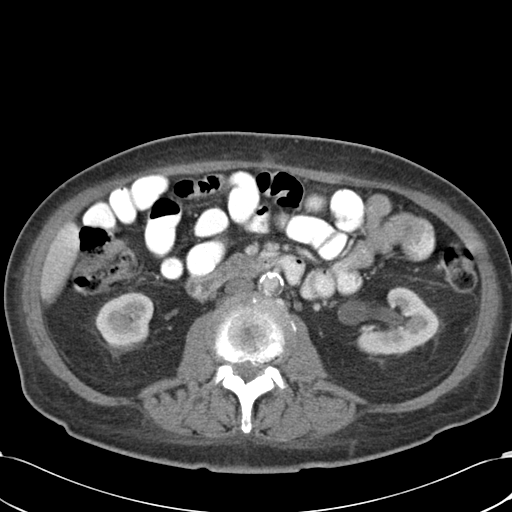
[im 55/95  bone]
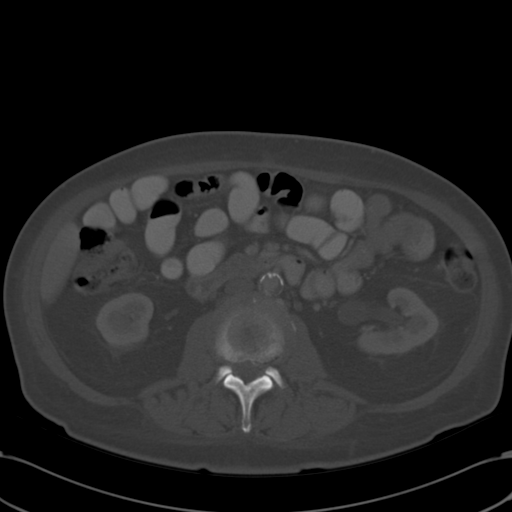
[im 65/95  soft-tissue]
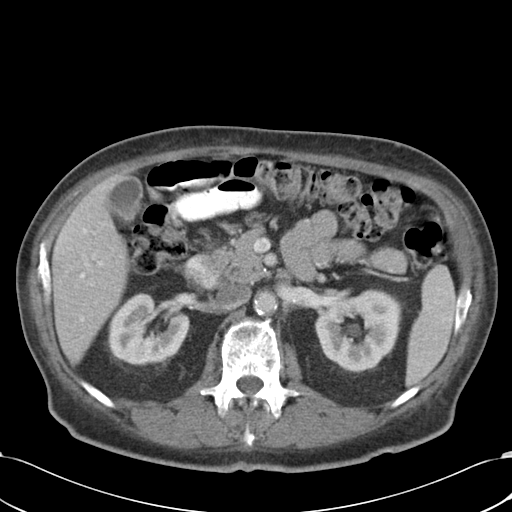
[im 70/95  soft-tissue]
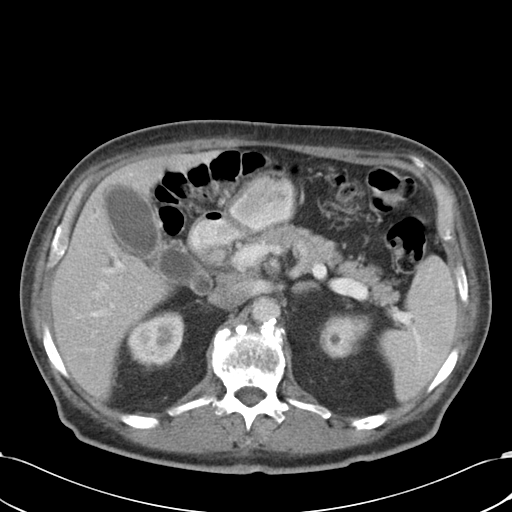
[im 75/95  soft-tissue]
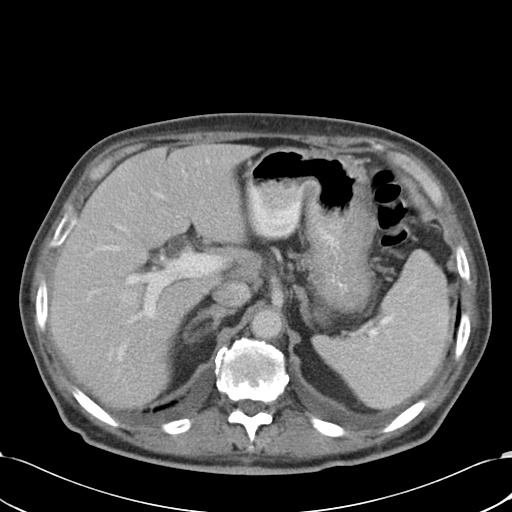
[im 85/95  soft-tissue]
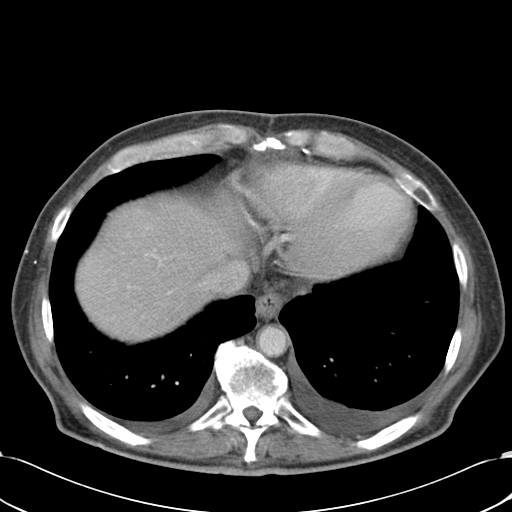
[im 90/95  soft-tissue]
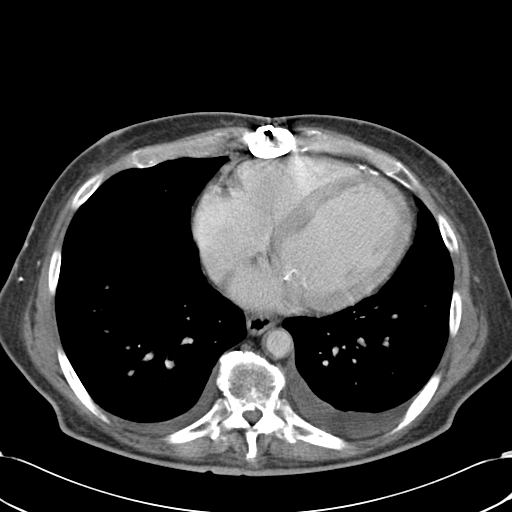

[Series 602: cor · coronal · 0.96mm/px · 3 of 110 slices shown]
[im 37/110  soft-tissue]
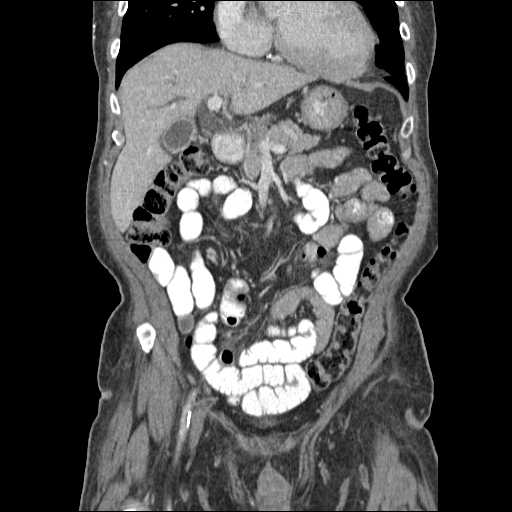
[im 49/110  soft-tissue]
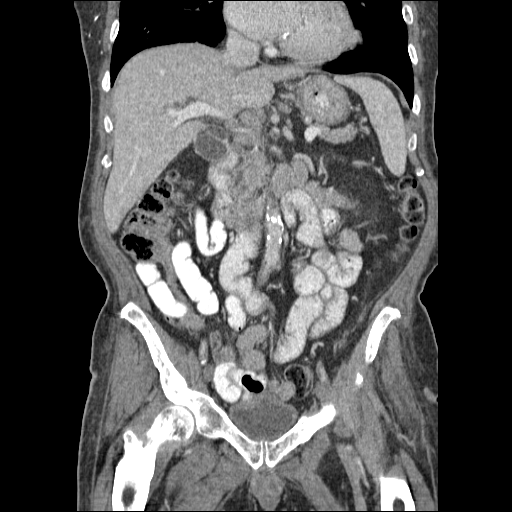
[im 61/110  soft-tissue]
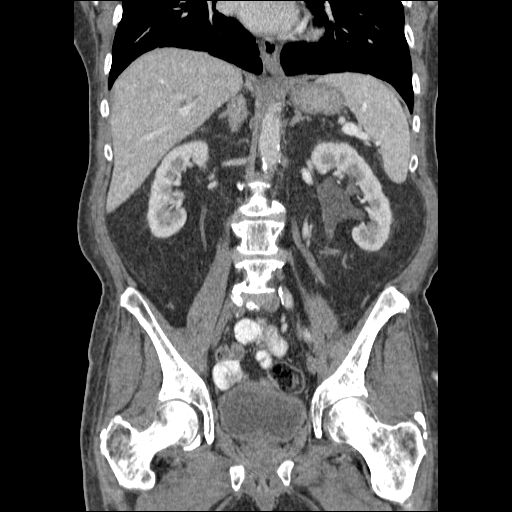

[17 of 46 positions shown; findings below may reference images not displayed]

FINDINGS: Diffuse biliary ductal dilatation is seen.  Multiple
stones are seen within the mid and distal common bile duct,
consistent with choledocholithiasis.  Gallbladder is unremarkable
in appearance.  No evidence of pancreatic mass or pancreatic ductal
dilatation.

No liver masses are identified.  The spleen, adrenal glands, and
kidneys are normal in appearance.  Mild median lobe hypertrophy of
the prostate gland is seen with mass effect on the bladder base.
Bladder is nondilated.  Seminal vesicles are symmetric.  No other
soft tissue masses or lymphadenopathy identified within the abdomen
or pelvis.

There is no evidence of inflammatory process or abnormal fluid
collections.  No evidence of bowel wall thickening or dilatation.

Tiny bilateral pleural effusions are noted.  Lung bases are
otherwise clear. Mild cardiomegaly noted.
IMPRESSION: 1.  Diffuse biliary dilatation due to choledocholithiasis, with
multiple calculi seen in the mid and distal common bile duct.
2.  No evidence of mass or lymphadenopathy.
3.  Mildly enlarged prostate.
4.  Tiny bilateral pleural effusions, of uncertain etiology and
clinical significance.

## 2013-03-13 ENCOUNTER — Other Ambulatory Visit: Payer: Self-pay | Admitting: Family Medicine

## 2013-03-14 ENCOUNTER — Encounter (HOSPITAL_COMMUNITY)
Admission: RE | Admit: 2013-03-14 | Discharge: 2013-03-14 | Disposition: A | Discharge status: Home / Self Care | Payer: Medicare Other | Source: Ambulatory Visit | Attending: Nephrology | Admitting: Nephrology

## 2013-03-14 LAB — POCT HEMOGLOBIN-HEMACUE: HEMOGLOBIN: 10.3 g/dL — AB (ref 13.0–17.0)

## 2013-03-14 MED ORDER — EPOETIN ALFA 3000 UNIT/ML IJ SOLN
3000.0000 [IU] | INTRAMUSCULAR | Status: DC
  Filled 2013-03-14: qty 1

## 2013-03-14 MED ORDER — EPOETIN ALFA 10000 UNIT/ML IJ SOLN
INTRAMUSCULAR | Status: AC
  Administered 2013-03-14: 3000 [IU] via SUBCUTANEOUS
  Filled 2013-03-14: qty 1

## 2013-03-21 ENCOUNTER — Encounter (HOSPITAL_COMMUNITY)
Admission: RE | Admit: 2013-03-21 | Discharge: 2013-03-21 | Disposition: A | Discharge status: Home / Self Care | Payer: Medicare Other | Source: Ambulatory Visit | Attending: Nephrology | Admitting: Nephrology

## 2013-03-21 DIAGNOSIS — N186 End stage renal disease: Secondary | ICD-10-CM | POA: Insufficient documentation

## 2013-03-21 DIAGNOSIS — D638 Anemia in other chronic diseases classified elsewhere: Secondary | ICD-10-CM | POA: Insufficient documentation

## 2013-03-21 LAB — POCT HEMOGLOBIN-HEMACUE: Hemoglobin: 10.9 g/dL — ABNORMAL LOW (ref 13.0–17.0)

## 2013-03-21 MED ORDER — EPOETIN ALFA 10000 UNIT/ML IJ SOLN
INTRAMUSCULAR | Status: AC
  Administered 2013-03-21: 3000 [IU] via SUBCUTANEOUS
  Filled 2013-03-21: qty 1

## 2013-03-21 MED ORDER — EPOETIN ALFA 3000 UNIT/ML IJ SOLN
3000.0000 [IU] | INTRAMUSCULAR | Status: DC
  Filled 2013-03-21: qty 1

## 2013-03-27 ENCOUNTER — Other Ambulatory Visit (HOSPITAL_COMMUNITY): Payer: Self-pay | Admitting: *Deleted

## 2013-03-28 ENCOUNTER — Encounter (HOSPITAL_COMMUNITY)
Admission: RE | Admit: 2013-03-28 | Discharge: 2013-03-28 | Disposition: A | Discharge status: Home / Self Care | Payer: Medicare Other | Source: Ambulatory Visit | Attending: Nephrology | Admitting: Nephrology

## 2013-03-28 LAB — FERRITIN: Ferritin: 184 ng/mL (ref 22–322)

## 2013-03-28 LAB — IRON AND TIBC
IRON: 41 ug/dL — AB (ref 42–135)
SATURATION RATIOS: 17 % — AB (ref 20–55)
TIBC: 247 ug/dL (ref 215–435)
UIBC: 206 ug/dL (ref 125–400)

## 2013-03-28 LAB — POCT HEMOGLOBIN-HEMACUE: Hemoglobin: 10 g/dL — ABNORMAL LOW (ref 13.0–17.0)

## 2013-03-28 MED ORDER — EPOETIN ALFA 10000 UNIT/ML IJ SOLN
INTRAMUSCULAR | Status: AC
  Filled 2013-03-28: qty 1

## 2013-03-28 MED ORDER — EPOETIN ALFA 3000 UNIT/ML IJ SOLN
3000.0000 [IU] | INTRAMUSCULAR | Status: DC
  Administered 2013-03-28: 3000 [IU] via SUBCUTANEOUS
  Filled 2013-03-28: qty 1

## 2013-03-30 ENCOUNTER — Other Ambulatory Visit: Payer: Self-pay | Admitting: Family Medicine

## 2013-03-30 MED FILL — Epoetin Alfa Inj 10000 Unit/ML: INTRAMUSCULAR | Qty: 1 | Status: AC

## 2013-04-04 ENCOUNTER — Encounter (HOSPITAL_COMMUNITY)
Admission: RE | Admit: 2013-04-04 | Discharge: 2013-04-04 | Disposition: A | Discharge status: Home / Self Care | Payer: Medicare Other | Source: Ambulatory Visit | Attending: Nephrology | Admitting: Nephrology

## 2013-04-04 DIAGNOSIS — D649 Anemia, unspecified: Secondary | ICD-10-CM | POA: Insufficient documentation

## 2013-04-04 LAB — POCT HEMOGLOBIN-HEMACUE: HEMOGLOBIN: 9.9 g/dL — AB (ref 13.0–17.0)

## 2013-04-04 MED ORDER — EPOETIN ALFA 3000 UNIT/ML IJ SOLN
3000.0000 [IU] | INTRAMUSCULAR | Status: DC
  Filled 2013-04-04: qty 1

## 2013-04-04 MED ORDER — EPOETIN ALFA 10000 UNIT/ML IJ SOLN
INTRAMUSCULAR | Status: AC
  Administered 2013-04-04: 10000 [IU]
  Filled 2013-04-04: qty 1

## 2013-04-07 ENCOUNTER — Other Ambulatory Visit: Payer: Self-pay | Admitting: Family Medicine

## 2013-04-07 ENCOUNTER — Other Ambulatory Visit: Payer: Self-pay | Admitting: Nurse Practitioner

## 2013-04-11 ENCOUNTER — Encounter (HOSPITAL_COMMUNITY)
Admission: RE | Admit: 2013-04-11 | Discharge: 2013-04-11 | Disposition: A | Discharge status: Home / Self Care | Payer: Medicare Other | Source: Ambulatory Visit | Attending: Nephrology | Admitting: Nephrology

## 2013-04-11 LAB — POCT HEMOGLOBIN-HEMACUE: Hemoglobin: 9.5 g/dL — ABNORMAL LOW (ref 13.0–17.0)

## 2013-04-11 MED ORDER — EPOETIN ALFA 3000 UNIT/ML IJ SOLN
3000.0000 [IU] | INTRAMUSCULAR | Status: DC
  Filled 2013-04-11: qty 1

## 2013-04-11 MED ORDER — EPOETIN ALFA 10000 UNIT/ML IJ SOLN
INTRAMUSCULAR | Status: AC
  Administered 2013-04-11: 10000 [IU] via SUBCUTANEOUS
  Filled 2013-04-11: qty 1

## 2013-04-13 ENCOUNTER — Encounter: Payer: Self-pay | Admitting: Family Medicine

## 2013-04-13 ENCOUNTER — Ambulatory Visit (INDEPENDENT_AMBULATORY_CARE_PROVIDER_SITE_OTHER): Payer: Medicare Other | Admitting: Family Medicine

## 2013-04-13 VITALS — BP 130/68 | HR 69 | Wt 202.0 lb

## 2013-04-13 DIAGNOSIS — R197 Diarrhea, unspecified: Secondary | ICD-10-CM

## 2013-04-13 DIAGNOSIS — K529 Noninfective gastroenteritis and colitis, unspecified: Secondary | ICD-10-CM

## 2013-04-13 DIAGNOSIS — I10 Essential (primary) hypertension: Secondary | ICD-10-CM

## 2013-04-13 DIAGNOSIS — E785 Hyperlipidemia, unspecified: Secondary | ICD-10-CM

## 2013-04-13 DIAGNOSIS — E119 Type 2 diabetes mellitus without complications: Secondary | ICD-10-CM

## 2013-04-13 LAB — HEMOGLOBIN A1C: Hgb A1c MFr Bld: 6.6 % — ABNORMAL HIGH (ref 4.6–6.5)

## 2013-04-13 MED ORDER — ONETOUCH DELICA LANCETS FINE MISC: Status: AC

## 2013-04-13 MED ORDER — GLUCOSE BLOOD VI STRP: ORAL_STRIP | Status: AC

## 2013-04-13 MED ORDER — DIPHENOXYLATE-ATROPINE 2.5-0.025 MG PO TABS: 1.0000 | ORAL_TABLET | Freq: Four times a day (QID) | ORAL | Status: DC | PRN

## 2013-04-13 NOTE — Progress Notes (Signed)
Subjective:    Patient ID: Kurt Richard, male    DOB: January 16, 1946, 68 y.o.   MRN: [id number]  HPI Patient has complex past medical history is seen for followup. We have not seen him in over one year. History of chronic kidney disease, type 2 diabetes, CAD, peripheral vascular disease, ischemic cardiomyopathy, hypertension, hyperlipidemia, chronic anemia related to chronic kidney disease, and subclinical hyperthyroidism. He is followed closely by nephrology. They state that he had recent thyroid functions per nephrology. Has not had recent A1c. They're not monitoring his blood sugars.  He continues to have some chronic diarrhea with generally 2-4 stools per day. Nonbloody stools. 2 most recent C. difficile assays were negative.  Past Medical History  Diagnosis Date  . Diabetes mellitus   . Hypertension   . Chronic systolic heart failure     echo 04/12/11: Mild LVH, EF 30-35%, mid to distal anteroseptal and apical hypokinesis, grade 2 diastolic dysfunction, moderate LAE, mild RAE.  [name] Hyperlipidemia   . Tobacco abuse   . CAD (coronary artery disease)     acute anterior STEMI, late presentation 04/06/11 LHC 3/5 demonstrated severe ostial left main 95% stenosis and otherwise three-vessel CAD with an ejection fraction of 15%.  Emergent CABG: Dr. Dorris Fetch - Grafts: LIMA-LAD, SVG-D1, SVG-OM1, SVG-PDA and PL.  . Ischemic cardiomyopathy   . DM2 (diabetes mellitus, type 2)   . 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 Acute renal insufficiency     05/2011 admission (cr 1.54 at discharge)  . Anemia     Post-CABG  . Arthritis   . Stroke   . Sleep apnea   . Neuromuscular disorder     diabetic neuropathy  . Diarrhea     has had c diff diarrhea  . Weakness   . Bruises easily   . PAD (peripheral artery disease) 07/28/2012    Angiography 08/2012: Right: 90% focal stenosis in external iliac artery, long occlusion of SFA with 3 vessel run off . s/p slef expanding stent placement to external  iliac artery. Left: borderline significant disease in comon and external iliac arteries. Diffuse 70-90% in SFA with 3 vessel run off.    Past Surgical History  Procedure Laterality Date  . Coronary artery bypass graft  04/06/2011    Procedure: CORONARY ARTERY BYPASS GRAFTING (CABG);  Surgeon: Loreli Slot, MD;  Location: [facility] OR;  Service: Open Heart Surgery;  Laterality: N/A;  Coronary Artery Bypass Graft times four utilizing the left internal mammary artery and the Right and left  greater Saphenous veins Harvested endoscopically.  . Fibroid bypass 04/06/11    . Colon surgery      rectal fissure  . Cervical disc surgery  1997 - approximate  . Colonoscopy  07/29/2011    Procedure: COLONOSCOPY;  Surgeon: Rachael Fee, MD;  Location: [facility] ENDOSCOPY;  Service: Endoscopy;  Laterality: N/A;  . Eye surgery  2011    cataract removal    reports that he quit smoking about 2 years ago. His smoking use included Cigarettes. He smoked 0.50 packs per day. He has never used smokeless tobacco. He reports that he does not drink alcohol or use illicit drugs. family history includes Congestive Heart Failure in his father; Diabetes in his mother and sister; Heart attack in his mother and sister. Allergies  Allergen Reactions  . Diltiazem Hcl Hives and Swelling  . Nifedipine Hives and Swelling      Review of Systems  Constitutional: Positive for fatigue.  Eyes: Negative for visual disturbance.  Respiratory: Positive for shortness of breath (Chronic and unchanged). Negative for cough and chest tightness.   Cardiovascular: Positive for leg swelling. Negative for chest pain and palpitations.  Gastrointestinal: Positive for diarrhea. Negative for nausea, vomiting and blood in stool.  Endocrine: Negative for polydipsia and polyuria.  Genitourinary: Negative for dysuria.  Neurological: Negative for dizziness, syncope, weakness, light-headedness and headaches.       Objective:   Physical Exam    Constitutional: He appears well-developed and well-nourished.  Neck: Neck supple. No thyromegaly present.  Cardiovascular: Normal rate.   Pulmonary/Chest: Effort normal and breath sounds normal. No respiratory distress. He has no wheezes. He has no rales.  Musculoskeletal: He exhibits edema.  Neurological: He is alert.          Assessment & Plan:  #1 type 2 diabetes. He has not had recent A1c nor any recent monitoring. Home blood glucose monitor given along with test strips and instructions for use. Repeat A1c. #2 chronic kidney disease followed by nephrology #3 chronic diarrhea. This has been worked up and followed extensively by GI in the past. This is being managed with Lomotil. Refill Lomotil for as needed use #4 history of CAD and hyperlipidemia. Followup with cardiology

## 2013-04-13 NOTE — Progress Notes (Signed)
Pre visit review using our clinic review tool, if applicable. No additional management support is needed unless otherwise documented below in the visit note. 

## 2013-04-16 ENCOUNTER — Telehealth: Payer: Self-pay | Admitting: Family Medicine

## 2013-04-16 NOTE — Telephone Encounter (Signed)
Relevant patient education assigned to patient using Emmi. ° °

## 2013-04-18 ENCOUNTER — Encounter (HOSPITAL_COMMUNITY)
Admission: RE | Admit: 2013-04-18 | Discharge: 2013-04-18 | Disposition: A | Discharge status: Home / Self Care | Payer: Medicare Other | Source: Ambulatory Visit | Attending: Nephrology | Admitting: Nephrology

## 2013-04-18 LAB — POCT HEMOGLOBIN-HEMACUE: Hemoglobin: 10.3 g/dL — ABNORMAL LOW (ref 13.0–17.0)

## 2013-04-18 MED ORDER — EPOETIN ALFA 10000 UNIT/ML IJ SOLN
INTRAMUSCULAR | Status: AC
  Administered 2013-04-18: 3000 [IU] via SUBCUTANEOUS
  Filled 2013-04-18: qty 1

## 2013-04-18 MED ORDER — EPOETIN ALFA 3000 UNIT/ML IJ SOLN
3000.0000 [IU] | INTRAMUSCULAR | Status: DC
  Filled 2013-04-18: qty 1

## 2013-04-23 ENCOUNTER — Other Ambulatory Visit: Payer: Self-pay | Admitting: Nurse Practitioner

## 2013-04-23 ENCOUNTER — Other Ambulatory Visit: Payer: Self-pay | Admitting: Family Medicine

## 2013-04-24 ENCOUNTER — Encounter: Payer: Self-pay | Admitting: Family Medicine

## 2013-04-24 ENCOUNTER — Ambulatory Visit (INDEPENDENT_AMBULATORY_CARE_PROVIDER_SITE_OTHER): Payer: Medicare Other | Admitting: Family Medicine

## 2013-04-24 VITALS — BP 130/70 | Temp 98.5°F | Wt 200.0 lb

## 2013-04-24 DIAGNOSIS — I8000 Phlebitis and thrombophlebitis of superficial vessels of unspecified lower extremity: Secondary | ICD-10-CM

## 2013-04-24 DIAGNOSIS — I8002 Phlebitis and thrombophlebitis of superficial vessels of left lower extremity: Secondary | ICD-10-CM

## 2013-04-24 MED ORDER — OXYCODONE HCL 5 MG PO TABS: 5.0000 mg | ORAL_TABLET | ORAL | Status: DC | PRN

## 2013-04-24 NOTE — Patient Instructions (Signed)
Elevation and heating pad.......[name]. be sure to use low heat and cover your leg with a tab  Pain pills when necessary

## 2013-04-24 NOTE — Progress Notes (Signed)
   Subjective:    Patient ID: Kurt Richard, male    DOB: 01/14/1946, 68 y.o.   MRN: [id number]  HPI Kurt Richard is a 68 year old male who comes in today for evaluation of pain in the a portion of his left leg for 5 days  He does some discomfort there about 5 days ago the pain has gotten worse. 2 days ago he noticed a red streak on the inner portion of the left leg. No history of trauma.  He's had a stent in his right renal artery   Review of Systems Negative    Objective:   Physical Exam  Well-developed well-nourished male no acute distress vital signs stable he is afebrile examination of left leg shows is red streak inner portion left leg with some tenderness of the superficial veins consistent with SVT      Assessment & Plan:  SVT left inner thigh....... treat symptomatically

## 2013-04-25 ENCOUNTER — Encounter (HOSPITAL_COMMUNITY)
Admission: RE | Admit: 2013-04-25 | Discharge: 2013-04-25 | Disposition: A | Discharge status: Home / Self Care | Payer: Medicare Other | Source: Ambulatory Visit | Attending: Nephrology | Admitting: Nephrology

## 2013-04-25 LAB — IRON AND TIBC
IRON: 30 ug/dL — AB (ref 42–135)
Saturation Ratios: 13 % — ABNORMAL LOW (ref 20–55)
TIBC: 240 ug/dL (ref 215–435)
UIBC: 210 ug/dL (ref 125–400)

## 2013-04-25 LAB — FERRITIN: FERRITIN: 282 ng/mL (ref 22–322)

## 2013-04-25 LAB — POCT HEMOGLOBIN-HEMACUE: Hemoglobin: 9.5 g/dL — ABNORMAL LOW (ref 13.0–17.0)

## 2013-04-25 MED ORDER — EPOETIN ALFA 3000 UNIT/ML IJ SOLN
3000.0000 [IU] | INTRAMUSCULAR | Status: DC
  Filled 2013-04-25: qty 1

## 2013-04-25 MED ORDER — EPOETIN ALFA 10000 UNIT/ML IJ SOLN
INTRAMUSCULAR | Status: AC
  Administered 2013-04-25: 10000 [IU] via SUBCUTANEOUS
  Filled 2013-04-25: qty 1

## 2013-05-01 ENCOUNTER — Other Ambulatory Visit (HOSPITAL_COMMUNITY): Payer: Self-pay | Admitting: *Deleted

## 2013-05-02 ENCOUNTER — Encounter (HOSPITAL_COMMUNITY)
Admission: RE | Admit: 2013-05-02 | Discharge: 2013-05-02 | Disposition: A | Discharge status: Home / Self Care | Payer: Medicare Other | Source: Ambulatory Visit | Attending: Nephrology | Admitting: Nephrology

## 2013-05-02 DIAGNOSIS — D649 Anemia, unspecified: Secondary | ICD-10-CM | POA: Insufficient documentation

## 2013-05-02 LAB — POCT HEMOGLOBIN-HEMACUE: Hemoglobin: 10 g/dL — ABNORMAL LOW (ref 13.0–17.0)

## 2013-05-02 MED ORDER — EPOETIN ALFA 3000 UNIT/ML IJ SOLN
3000.0000 [IU] | INTRAMUSCULAR | Status: DC
  Filled 2013-05-02: qty 1

## 2013-05-02 MED ORDER — FERUMOXYTOL INJECTION 510 MG/17 ML
510.0000 mg | INTRAVENOUS | Status: DC
  Administered 2013-05-02: 510 mg via INTRAVENOUS
  Filled 2013-05-02: qty 17

## 2013-05-02 MED ORDER — EPOETIN ALFA 10000 UNIT/ML IJ SOLN
INTRAMUSCULAR | Status: AC
  Administered 2013-05-02: 3000 [IU] via SUBCUTANEOUS
  Filled 2013-05-02: qty 1

## 2013-05-08 ENCOUNTER — Other Ambulatory Visit (HOSPITAL_COMMUNITY): Payer: Self-pay | Admitting: *Deleted

## 2013-05-09 ENCOUNTER — Encounter (HOSPITAL_COMMUNITY)
Admission: RE | Admit: 2013-05-09 | Discharge: 2013-05-09 | Disposition: A | Discharge status: Home / Self Care | Payer: Medicare Other | Source: Ambulatory Visit | Attending: Nephrology | Admitting: Nephrology

## 2013-05-09 LAB — POCT HEMOGLOBIN-HEMACUE: HEMOGLOBIN: 9.8 g/dL — AB (ref 13.0–17.0)

## 2013-05-09 MED ORDER — SODIUM CHLORIDE 0.9 % IV SOLN
510.0000 mg | INTRAVENOUS | Status: AC
  Administered 2013-05-09: 510 mg via INTRAVENOUS
  Filled 2013-05-09: qty 17

## 2013-05-09 MED ORDER — EPOETIN ALFA 10000 UNIT/ML IJ SOLN
INTRAMUSCULAR | Status: AC
  Administered 2013-05-09: 3000 [IU]
  Filled 2013-05-09: qty 1

## 2013-05-09 MED ORDER — EPOETIN ALFA 3000 UNIT/ML IJ SOLN
3000.0000 [IU] | INTRAMUSCULAR | Status: DC
  Filled 2013-05-09: qty 1

## 2013-05-16 ENCOUNTER — Encounter (HOSPITAL_COMMUNITY)
Admission: RE | Admit: 2013-05-16 | Discharge: 2013-05-16 | Disposition: A | Discharge status: Home / Self Care | Payer: Medicare Other | Source: Ambulatory Visit | Attending: Nephrology | Admitting: Nephrology

## 2013-05-16 LAB — CBC
HEMATOCRIT: 29.9 % — AB (ref 39.0–52.0)
HEMOGLOBIN: 9.8 g/dL — AB (ref 13.0–17.0)
MCH: 30 pg (ref 26.0–34.0)
MCHC: 32.8 g/dL (ref 30.0–36.0)
MCV: 91.4 fL (ref 78.0–100.0)
Platelets: 257 10*3/uL (ref 150–400)
RBC: 3.27 MIL/uL — ABNORMAL LOW (ref 4.22–5.81)
RDW: 16.1 % — ABNORMAL HIGH (ref 11.5–15.5)
WBC: 9.1 10*3/uL (ref 4.0–10.5)

## 2013-05-16 MED ORDER — EPOETIN ALFA 3000 UNIT/ML IJ SOLN
3000.0000 [IU] | INTRAMUSCULAR | Status: DC
  Filled 2013-05-16: qty 1

## 2013-05-16 MED ORDER — EPOETIN ALFA 10000 UNIT/ML IJ SOLN
INTRAMUSCULAR | Status: AC
  Administered 2013-05-16: 3000 [IU] via SUBCUTANEOUS
  Filled 2013-05-16: qty 1

## 2013-05-23 ENCOUNTER — Encounter (HOSPITAL_COMMUNITY)
Admission: RE | Admit: 2013-05-23 | Discharge: 2013-05-23 | Disposition: A | Discharge status: Home / Self Care | Payer: Medicare Other | Source: Ambulatory Visit | Attending: Nephrology | Admitting: Nephrology

## 2013-05-23 LAB — POCT HEMOGLOBIN-HEMACUE: HEMOGLOBIN: 10 g/dL — AB (ref 13.0–17.0)

## 2013-05-23 MED ORDER — EPOETIN ALFA 10000 UNIT/ML IJ SOLN
INTRAMUSCULAR | Status: AC
  Filled 2013-05-23: qty 1

## 2013-05-23 MED ORDER — EPOETIN ALFA 3000 UNIT/ML IJ SOLN
3000.0000 [IU] | INTRAMUSCULAR | Status: DC
  Administered 2013-05-23: 3000 [IU] via SUBCUTANEOUS

## 2013-05-24 MED FILL — Epoetin Alfa Inj 10000 Unit/ML: INTRAMUSCULAR | Qty: 1 | Status: AC

## 2013-05-28 ENCOUNTER — Other Ambulatory Visit (HOSPITAL_COMMUNITY): Payer: Self-pay | Admitting: Cardiology

## 2013-05-28 DIAGNOSIS — I6529 Occlusion and stenosis of unspecified carotid artery: Secondary | ICD-10-CM

## 2013-05-30 ENCOUNTER — Encounter (HOSPITAL_COMMUNITY)
Admission: RE | Admit: 2013-05-30 | Discharge: 2013-05-30 | Disposition: A | Discharge status: Home / Self Care | Payer: Medicare Other | Source: Ambulatory Visit | Attending: Nephrology | Admitting: Nephrology

## 2013-05-30 LAB — IRON AND TIBC
IRON: 47 ug/dL (ref 42–135)
Saturation Ratios: 20 % (ref 20–55)
TIBC: 230 ug/dL (ref 215–435)
UIBC: 183 ug/dL (ref 125–400)

## 2013-05-30 LAB — FERRITIN: FERRITIN: 437 ng/mL — AB (ref 22–322)

## 2013-05-30 LAB — POCT HEMOGLOBIN-HEMACUE: Hemoglobin: 10.5 g/dL — ABNORMAL LOW (ref 13.0–17.0)

## 2013-05-30 MED ORDER — EPOETIN ALFA 3000 UNIT/ML IJ SOLN
3000.0000 [IU] | INTRAMUSCULAR | Status: DC
  Filled 2013-05-30: qty 1

## 2013-05-30 MED ORDER — EPOETIN ALFA 10000 UNIT/ML IJ SOLN
INTRAMUSCULAR | Status: AC
  Administered 2013-05-30: 3000 [IU] via SUBCUTANEOUS
  Filled 2013-05-30: qty 1

## 2013-06-05 ENCOUNTER — Ambulatory Visit (HOSPITAL_COMMUNITY): Payer: Medicare Other | Attending: Cardiology | Admitting: Cardiology

## 2013-06-05 DIAGNOSIS — Z951 Presence of aortocoronary bypass graft: Secondary | ICD-10-CM | POA: Insufficient documentation

## 2013-06-05 DIAGNOSIS — Z87891 Personal history of nicotine dependence: Secondary | ICD-10-CM | POA: Insufficient documentation

## 2013-06-05 DIAGNOSIS — I10 Essential (primary) hypertension: Secondary | ICD-10-CM | POA: Insufficient documentation

## 2013-06-05 DIAGNOSIS — I739 Peripheral vascular disease, unspecified: Secondary | ICD-10-CM | POA: Insufficient documentation

## 2013-06-05 DIAGNOSIS — E785 Hyperlipidemia, unspecified: Secondary | ICD-10-CM | POA: Insufficient documentation

## 2013-06-05 DIAGNOSIS — E119 Type 2 diabetes mellitus without complications: Secondary | ICD-10-CM | POA: Insufficient documentation

## 2013-06-05 DIAGNOSIS — I2510 Atherosclerotic heart disease of native coronary artery without angina pectoris: Secondary | ICD-10-CM | POA: Insufficient documentation

## 2013-06-05 DIAGNOSIS — I658 Occlusion and stenosis of other precerebral arteries: Secondary | ICD-10-CM | POA: Insufficient documentation

## 2013-06-05 DIAGNOSIS — I6529 Occlusion and stenosis of unspecified carotid artery: Secondary | ICD-10-CM | POA: Insufficient documentation

## 2013-06-05 NOTE — Progress Notes (Signed)
Carotid duplex complete 

## 2013-06-06 ENCOUNTER — Encounter (HOSPITAL_COMMUNITY)
Admission: RE | Admit: 2013-06-06 | Discharge: 2013-06-06 | Disposition: A | Discharge status: Home / Self Care | Payer: Medicare Other | Source: Ambulatory Visit | Attending: Nephrology | Admitting: Nephrology

## 2013-06-06 DIAGNOSIS — N183 Chronic kidney disease, stage 3 unspecified: Secondary | ICD-10-CM | POA: Insufficient documentation

## 2013-06-06 DIAGNOSIS — D638 Anemia in other chronic diseases classified elsewhere: Secondary | ICD-10-CM | POA: Diagnosis present

## 2013-06-06 LAB — POCT HEMOGLOBIN-HEMACUE: HEMOGLOBIN: 10.1 g/dL — AB (ref 13.0–17.0)

## 2013-06-06 MED ORDER — EPOETIN ALFA 10000 UNIT/ML IJ SOLN
INTRAMUSCULAR | Status: AC
  Administered 2013-06-06: 10000 [IU] via SUBCUTANEOUS
  Filled 2013-06-06: qty 1

## 2013-06-06 MED ORDER — EPOETIN ALFA 3000 UNIT/ML IJ SOLN
3000.0000 [IU] | INTRAMUSCULAR | Status: DC
  Filled 2013-06-06: qty 1

## 2013-06-12 ENCOUNTER — Other Ambulatory Visit (HOSPITAL_COMMUNITY): Payer: Self-pay | Admitting: *Deleted

## 2013-06-13 ENCOUNTER — Encounter (HOSPITAL_COMMUNITY)
Admission: RE | Admit: 2013-06-13 | Discharge: 2013-06-13 | Disposition: A | Discharge status: Home / Self Care | Payer: Medicare Other | Source: Ambulatory Visit | Attending: Nephrology | Admitting: Nephrology

## 2013-06-13 DIAGNOSIS — D638 Anemia in other chronic diseases classified elsewhere: Secondary | ICD-10-CM | POA: Diagnosis not present

## 2013-06-13 LAB — POCT HEMOGLOBIN-HEMACUE: HEMOGLOBIN: 10.5 g/dL — AB (ref 13.0–17.0)

## 2013-06-13 MED ORDER — EPOETIN ALFA 3000 UNIT/ML IJ SOLN
3000.0000 [IU] | INTRAMUSCULAR | Status: DC
  Filled 2013-06-13: qty 1

## 2013-06-13 MED ORDER — EPOETIN ALFA 10000 UNIT/ML IJ SOLN
INTRAMUSCULAR | Status: AC
  Administered 2013-06-13: 3000 [IU] via SUBCUTANEOUS
  Filled 2013-06-13: qty 1

## 2013-06-19 ENCOUNTER — Ambulatory Visit (HOSPITAL_COMMUNITY)
Admission: RE | Admit: 2013-06-19 | Discharge: 2013-06-19 | Disposition: A | Discharge status: Home / Self Care | Payer: Medicare Other | Source: Ambulatory Visit | Attending: Internal Medicine | Admitting: Internal Medicine

## 2013-06-19 ENCOUNTER — Ambulatory Visit (INDEPENDENT_AMBULATORY_CARE_PROVIDER_SITE_OTHER): Payer: Medicare Other | Admitting: Cardiovascular Disease

## 2013-06-19 ENCOUNTER — Encounter: Payer: Self-pay | Admitting: Cardiovascular Disease

## 2013-06-19 VITALS — BP 98/64 | HR 73 | Ht 68.0 in | Wt 200.0 lb

## 2013-06-19 DIAGNOSIS — I8002 Phlebitis and thrombophlebitis of superficial vessels of left lower extremity: Secondary | ICD-10-CM

## 2013-06-19 DIAGNOSIS — I739 Peripheral vascular disease, unspecified: Secondary | ICD-10-CM

## 2013-06-19 DIAGNOSIS — R609 Edema, unspecified: Secondary | ICD-10-CM

## 2013-06-19 DIAGNOSIS — I8000 Phlebitis and thrombophlebitis of superficial vessels of unspecified lower extremity: Secondary | ICD-10-CM

## 2013-06-19 DIAGNOSIS — M79609 Pain in unspecified limb: Secondary | ICD-10-CM

## 2013-06-19 DIAGNOSIS — M7989 Other specified soft tissue disorders: Secondary | ICD-10-CM

## 2013-06-19 DIAGNOSIS — I2510 Atherosclerotic heart disease of native coronary artery without angina pectoris: Secondary | ICD-10-CM

## 2013-06-19 NOTE — Patient Instructions (Addendum)
Your physician has requested that you have a LEFT lower extremity venous duplex. This test is an ultrasound of the veins in the legs. It looks at venous blood flow that carries blood from the heart to the legs. Allow one hour for a Lower Venous exam. There are no restrictions or special instructions. (This test should be done ASAP)  Your physician has recommended that you have an aorto-iliac duplex and lower extremity arterial doppler in July.  Your physician wants you to follow-up in: 6 MONTHS with Dr Kirke [name].  You will receive a reminder letter in the mail two months in advance. If you don't receive a letter, please call our office to schedule the follow-up appointment.  Your physician recommends that you continue on your current medications as directed. Please refer to the Current Medication list given to you today.  Your physician has requested that you have a carotid duplex in 6 MONTHS. This test is an ultrasound of the carotid arteries in your neck. It looks at blood flow through these arteries that supply the brain with blood. Allow one hour for this exam. There are no restrictions or special instructions.

## 2013-06-19 NOTE — Progress Notes (Signed)
Left Lower Ext. Venous Duplex Completed. Negative for DVT and SVT.  Marguerite Jarboe, BS, RDMS, RVT  

## 2013-06-19 NOTE — Assessment & Plan Note (Signed)
He reports no symptoms suggestive of angina. Continue medical therapy. 

## 2013-06-19 NOTE — Progress Notes (Signed)
Primary cardiologist: Dr. Elease HashimotoNahser  HPI  This is a pleasant 68 year old man who is here today for a followup visit regarding peripheral arterial disease. The patient has known history of coronary artery disease status post anterior MI in 2013. He is status post CABG. He had severe LV systolic dysfunction at that time with gradual improvement. Most recent ejection fraction was 35-40%. He has multiple chronic medical conditions including type 2 diabetes, hypertension, hyperlipidemia and chronic kidney disease. Most recent creatinine was 3. He suffers from chronic diarrhea.  He was evaluated in June, 2014  for severe claudication involving left calf discomfort with rest pain. His ABI was severely reduced on the left and moderately reduced on the right. I performed lower extremity angiography which showed: left : 90% focal stenosis in external iliac artery, long occlusion of SFA with 3 vessel run off . s/p slef expanding stent placement to external iliac artery. Right : borderline significant disease in comon and external iliac arteries. Diffuse 70-90% in SFA with 3 vessel run off.  He has been doing reasonably well. He continues to have issues with diarrhea. He also reports left leg swelling and redness which started a few months ago. He was suspected of having superficial phlebitis. Symptoms improved. However, over the last week he noticed worsening left leg swelling and redness with some discomfort. He denies claudication but does not do much walking. He walks slowly with a cane.  Allergies  Allergen Reactions  . Diltiazem Hcl Hives and Swelling  . Nifedipine Hives and Swelling     Current Outpatient Prescriptions on File Prior to Visit  Medication Sig Dispense Refill  . aspirin 81 MG EC tablet Take 1 tablet (81 mg total) by mouth daily.      [name]. BIDIL 20-37.5 MG per tablet TAKE 1 TABLET BY MOUTH TWICE A DAY  60 tablet  5  . calcitRIOL (ROCALTROL) 0.25 MCG capsule Take 0.25 mcg by mouth daily.  PATIENT ONLY TAKES 3 DAYS A WEEK      . carvedilol (COREG) 25 MG tablet TAKE 1 TABLET BY MOUTH TWICE A DAY WITH A MEAL  60 tablet  5  . clopidogrel (PLAVIX) 75 MG tablet Take 1 tablet (75 mg total) by mouth daily.  30 tablet  11  . diphenoxylate-atropine (LOMOTIL) 2.5-0.025 MG per tablet Take 1 tablet by mouth 4 (four) times daily as needed for diarrhea or loose stools.  90 tablet  5  . epoetin alfa (EPOGEN,PROCRIT) 3000 UNIT/ML injection Inject 3,000 Units into the skin once.      . fluticasone (FLONASE) 50 MCG/ACT nasal spray Place 2 sprays into the nose daily.      . furosemide (LASIX) 40 MG tablet Take 1 tablet (40 mg total) by mouth daily.  30 tablet  6  . glucose blood (ONETOUCH VERIO) test strip Use as instructed. DX: 250.00  100 each  3  . methimazole (TAPAZOLE) 5 MG tablet take one tablet twice daily      . nitroGLYCERIN (NITROSTAT) 0.4 MG SL tablet Place 0.4 mg under the tongue every 5 (five) minutes as needed for chest pain.      Letta Pate. ONETOUCH DELICA LANCETS FINE MISC Use as instructed. DX: 250.00  100 each  3  . oxyCODONE (OXY IR/ROXICODONE) 5 MG immediate release tablet Take 1 tablet (5 mg total) by mouth as needed. Per Dr. Georgann HousekeeperNaser  40 tablet  0  . paregoric 2 MG/5ML solution Take 5 mLs by mouth 4 (four) times daily as  needed for diarrhea or loose stools.      . sodium bicarbonate 325 MG tablet Take 650 mg by mouth 2 (two) times daily.        No current facility-administered medications on file prior to visit.     Past Medical History  Diagnosis Date  . Diabetes mellitus   . Hypertension   . Chronic systolic heart failure     echo 04/12/11: Mild LVH, EF 30-35%, mid to distal anteroseptal and apical hypokinesis, grade 2 diastolic dysfunction, moderate LAE, mild RAE.  [name]. Hyperlipidemia   . Tobacco abuse   . CAD (coronary artery disease)     acute anterior STEMI, late presentation 04/06/11 LHC 3/5 demonstrated severe ostial left main 95% stenosis and otherwise three-vessel CAD with an  ejection fraction of 15%.  Emergent CABG: Dr. Dorris FetchHendrickson - Grafts: LIMA-LAD, SVG-D1, SVG-OM1, SVG-PDA and PL.  . Ischemic cardiomyopathy   . DM2 (diabetes mellitus, type 2)   . 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 Acute renal insufficiency     05/2011 admission (cr 1.54 at discharge)  . Anemia     Post-CABG  . Arthritis   . Stroke   . Sleep apnea   . Neuromuscular disorder     diabetic neuropathy  . Diarrhea     has had c diff diarrhea  . Weakness   . Bruises easily   . PAD (peripheral artery disease) 07/28/2012    Angiography 08/2012: Right: 90% focal stenosis in external iliac artery, long occlusion of SFA with 3 vessel run off . s/p slef expanding stent placement to external iliac artery. Left: borderline significant disease in comon and external iliac arteries. Diffuse 70-90% in SFA with 3 vessel run off.      Past Surgical History  Procedure Laterality Date  . Coronary artery bypass graft  04/06/2011    Procedure: CORONARY ARTERY BYPASS GRAFTING (CABG);  Surgeon: Loreli SlotSteven C Hendrickson, MD;  [facility];  Service: Open Heart Surgery;  Laterality: N/A;  Coronary Artery Bypass Graft times four utilizing the left internal mammary artery and the Right and left  greater Saphenous veins Harvested endoscopically.  . Fibroid bypass 04/06/11    . Colon surgery      rectal fissure  . Cervical disc surgery  1997 - approximate  . Colonoscopy  07/29/2011    Procedure: COLONOSCOPY;  Surgeon: Rachael Feeaniel P Jacobs, MD;  Location: [facility] ENDOSCOPY;  Service: Endoscopy;  Laterality: N/A;  . Eye surgery  2011    cataract removal     Family History  Problem Relation Age of Onset  . Heart attack Mother     ?5750s  . Diabetes Mother   . Heart attack Sister     740s  . Diabetes Sister     DietitianBorther  . Congestive Heart Failure Father      History   Social History  . Marital Status: Single    Spouse Name: N/A    Number of Children: 0  . Years of Education: N/A   Occupational History    . Not on file.   Social History Main Topics  . Smoking status: Former Smoker -- 0.50 packs/day    Types: Cigarettes    Quit date: 04/06/2011  . Smokeless tobacco: Never Used  . Alcohol Use: No  . Drug Use: No  . Sexual Activity: No   Other Topics Concern  . Not on file   Social History Narrative  . No narrative on file  PHYSICAL EXAM   BP 98/64  Pulse 73  Ht 5\' 8"  (1.727 m)  Wt 200 lb (90.719 kg)  BMI 30.42 kg/m2  SpO2 96% Constitutional: He is oriented to person, place, and time. He appears well-developed and well-nourished. No distress.  HENT: No nasal discharge.  Head: Normocephalic and atraumatic.  Eyes: Pupils are equal and round. Right eye exhibits no discharge. Left eye exhibits no discharge.  Neck: Normal range of motion. Neck supple. No JVD present. No thyromegaly present.  Cardiovascular: Normal rate, regular rhythm, normal heart sounds and. Exam reveals no gallop and no friction rub. No murmur heard.  Pulmonary/Chest: Effort normal and breath sounds normal. No stridor. No respiratory distress. He has no wheezes. He has no rales. He exhibits no tenderness.  Abdominal: Soft. Bowel sounds are normal. He exhibits no distension. There is no tenderness. There is no rebound and no guarding.  Musculoskeletal: Normal range of motion. He exhibits mild edema worse on the left side and no tenderness.  Neurological: He is alert and oriented to person, place, and time. Coordination normal.  Skin: Skin is warm and dry. Redness in the left leg. He is not diaphoretic. No erythema. No pallor.  Psychiatric: He has a normal mood and affect. His behavior is normal. Judgment and thought content normal.  Vascular:  Femoral pulse: Diminished on both sides.  Distal pulses are not palpable.      ASSESSMENT AND PLAN

## 2013-06-19 NOTE — Assessment & Plan Note (Signed)
Given worsening swelling and redness, I requested lower extremity venous duplex to rule out DVT.

## 2013-06-19 NOTE — Assessment & Plan Note (Signed)
No significant claudication. However, he is not very active. Most recent duplex in January showed patent left external iliac artery stent with moderately elevated velocity. Recommend a followup view of the duplex in July. He is known to have bilateral SFA disease which is being treated medically.

## 2013-06-20 ENCOUNTER — Encounter (HOSPITAL_COMMUNITY)
Admission: RE | Admit: 2013-06-20 | Discharge: 2013-06-20 | Disposition: A | Discharge status: Home / Self Care | Payer: Medicare Other | Source: Ambulatory Visit | Attending: Nephrology | Admitting: Nephrology

## 2013-06-20 DIAGNOSIS — D638 Anemia in other chronic diseases classified elsewhere: Secondary | ICD-10-CM | POA: Diagnosis not present

## 2013-06-20 LAB — POCT HEMOGLOBIN-HEMACUE: Hemoglobin: 10.3 g/dL — ABNORMAL LOW (ref 13.0–17.0)

## 2013-06-20 MED ORDER — EPOETIN ALFA 10000 UNIT/ML IJ SOLN
INTRAMUSCULAR | Status: AC
  Administered 2013-06-20: 10000 [IU] via SUBCUTANEOUS
  Filled 2013-06-20: qty 1

## 2013-06-20 MED ORDER — EPOETIN ALFA 3000 UNIT/ML IJ SOLN
3000.0000 [IU] | INTRAMUSCULAR | Status: DC
  Filled 2013-06-20: qty 1

## 2013-06-27 ENCOUNTER — Encounter (HOSPITAL_COMMUNITY)
Admission: RE | Admit: 2013-06-27 | Discharge: 2013-06-27 | Disposition: A | Discharge status: Home / Self Care | Payer: Medicare Other | Source: Ambulatory Visit | Attending: Nephrology | Admitting: Nephrology

## 2013-06-27 DIAGNOSIS — D638 Anemia in other chronic diseases classified elsewhere: Secondary | ICD-10-CM | POA: Diagnosis not present

## 2013-06-27 LAB — IRON AND TIBC
IRON: 55 ug/dL (ref 42–135)
Saturation Ratios: 22 % (ref 20–55)
TIBC: 255 ug/dL (ref 215–435)
UIBC: 200 ug/dL (ref 125–400)

## 2013-06-27 LAB — FERRITIN: FERRITIN: 369 ng/mL — AB (ref 22–322)

## 2013-06-27 LAB — POCT HEMOGLOBIN-HEMACUE: Hemoglobin: 10.6 g/dL — ABNORMAL LOW (ref 13.0–17.0)

## 2013-06-27 MED ORDER — EPOETIN ALFA 3000 UNIT/ML IJ SOLN
3000.0000 [IU] | INTRAMUSCULAR | Status: DC
  Filled 2013-06-27: qty 1

## 2013-06-27 MED ORDER — EPOETIN ALFA 10000 UNIT/ML IJ SOLN
INTRAMUSCULAR | Status: AC
  Administered 2013-06-27: 3000 [IU] via SUBCUTANEOUS
  Filled 2013-06-27: qty 1

## 2013-07-05 ENCOUNTER — Encounter (HOSPITAL_COMMUNITY)
Admission: RE | Admit: 2013-07-05 | Discharge: 2013-07-05 | Disposition: A | Discharge status: Home / Self Care | Payer: Medicare Other | Source: Ambulatory Visit | Attending: Nephrology | Admitting: Nephrology

## 2013-07-05 DIAGNOSIS — D649 Anemia, unspecified: Secondary | ICD-10-CM | POA: Insufficient documentation

## 2013-07-05 LAB — POCT HEMOGLOBIN-HEMACUE: Hemoglobin: 11.1 g/dL — ABNORMAL LOW (ref 13.0–17.0)

## 2013-07-05 MED ORDER — EPOETIN ALFA 10000 UNIT/ML IJ SOLN
INTRAMUSCULAR | Status: AC
  Administered 2013-07-05: 3000 [IU] via SUBCUTANEOUS
  Filled 2013-07-05: qty 1

## 2013-07-05 MED ORDER — EPOETIN ALFA 3000 UNIT/ML IJ SOLN
3000.0000 [IU] | INTRAMUSCULAR | Status: DC
  Filled 2013-07-05: qty 1

## 2013-07-06 ENCOUNTER — Encounter: Payer: Self-pay | Admitting: Cardiovascular Disease

## 2013-07-06 ENCOUNTER — Ambulatory Visit (INDEPENDENT_AMBULATORY_CARE_PROVIDER_SITE_OTHER): Payer: Medicare Other | Admitting: Cardiovascular Disease

## 2013-07-06 VITALS — BP 146/60 | HR 80 | Ht 68.0 in | Wt 204.6 lb

## 2013-07-06 DIAGNOSIS — I2510 Atherosclerotic heart disease of native coronary artery without angina pectoris: Secondary | ICD-10-CM

## 2013-07-06 DIAGNOSIS — I5022 Chronic systolic (congestive) heart failure: Secondary | ICD-10-CM

## 2013-07-06 MED ORDER — FUROSEMIDE 40 MG PO TABS: ORAL_TABLET | ORAL | Status: DC

## 2013-07-06 MED ORDER — ISOSORB DINITRATE-HYDRALAZINE 20-37.5 MG PO TABS: ORAL_TABLET | ORAL | Status: DC

## 2013-07-06 NOTE — Patient Instructions (Addendum)
Increase Bidil to one tablet three times a day   Your physician recommends that you schedule a follow-up appointment in: 3 months 10/09/13 at 3:15 pm.

## 2013-07-06 NOTE — Assessment & Plan Note (Signed)
His heart failure seems to be fairly well controlled. His blood pressures little elevated. We'll increase the Bidil to 3 times a day.  I'll see him in 3 months for followup visit.

## 2013-07-06 NOTE — Progress Notes (Signed)
Kurt Richard Date of Birth  02/16/1945 [facility]     [place] [facility]  [address]    [address]   [address] [place], [place]  [address]    [place], [place]  [address] [phone]  Fax  [phone]  [phone]  Fax [phone]  Problem list: 1. Acute on chronic systolic CHF  Echo Feb. 2014 Left ventricle: The cavity size was normal.  moderate LVH.  The estimated ejection fraction was in the range of 35% to 40%. There is moderate hypokinesis of the mid-distalanteroseptal myocardium.  pseudonormal left ventricular filling pattern, with concomitant abnormal relaxation and increased filling pressure (grade 2 diastolic dysfunction). - Aortic valve: There was very mild stenosis. Mean gradient:39mm Hg (S). Peak gradient: 16mm Hg (S). - Mitral valve: Calcified annulus. Mildly thickened leaflets .- Left atrium: The atrium was moderately dilated. - Right ventricle: The cavity size was mildly dilated. - Right atrium: The atrium was mildly dilated.  2. C. Diff colitis. 3. Acute renal insufficiency, d/c Cr 1.54  4. CAD - recent late-presenting anterior MI 04/2011 with 3V dz s/p CABG   - postop course 04/2011 complicated by LV dysfunction requiring IABP/inotropes, ABL anemia, CVA  5.  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 Diabetes mellitus  7. Tobacco abuse  8. hypertension 9. hyperlipidemia  History of Present Illness:  Kurt Richard is a 68 year old gentleman with the above-noted medical history.  He is still having problems with his abdomen and the diarrhea.  The last stool sample was negative for c. Diff. He needs to have another ERCP at [facility]. He has done well from a cardiac standpoint.  No further exacerbations of CHf.  No angina.  April 13, 2012: Kurt Richard is doing better.  He saw [name] for pre-op evaluation prior gallbladder surgery. He had an echocardiogram which revealed some improvement of his left ventricular systolic function. His ejection fraction is now 35-40%.  His  preop labs also revealed that he has chronic renal insufficiency with a creatinine of 2.3. His BUN is also elevated. In addition, he also has anemia.  His Lasix was decreased to 40 mg 3 times a week after seeing his elevated creatinine.  His breathing remains the same. He occasionally will take an extra Lasix if he develops chest congestion.  Jun 29, 2012:  He's not having episodes of chest pain or shortness of breath. Kurt Richard is making some slow progress.  He is not able to exercise because of left calf pain. He is only able to stand on his legs for several minutes.  He also has neuropathy which limits his exercise. He has not been eating any extra salt.   His baseline creatinine is 2.2.  He does not want to start on dialysis.    Sept. 5, 2014:  He has done well.  His Hb is still low.  He goes for Procrit injection every week and iron infusions on occasion.  No obvious blood in his stool.  He has had peripheral arterial evaluation and stenting of his left iliac artery:  Conclusions:  1. Severe focal stenosis in the distal left external iliac artery. Moderate to significant diffuse disease in the distal right common iliac artery and right external iliac artery.  2. Occluded left SFA in a long segment with collaterals from the profunda. Three-vessel runoff below the knee.  3. Diffusely diseased right SFA throughout its course with three-vessel runoff below the knee.  4. Successful self-expanding stent placement to the left external iliac artery.  He was started back on the plavix.  He still eats an unrestricted diet - still eats carbs and fats as well as some extra salt.   Dec. 4, 2014:  Kurt Richard is doing well.  BP at home is normal.  No CP or dyspnea.   He's not eating much salt. He avoids hot dogs, and other salty foods. He has seen Dr. Hyman HopesWebb recently.  He received Procrit weekly.   They have discussed starting dialysis.   July 06, 2013:  Kurt Richard is doing ok  he's been having some leg  edema. He doubled up on his Lasix for about a week and his leg edema has almost completely resolved. He's having problems with anemia. His hemoglobin is now finally 11.1.   Current Outpatient Prescriptions on File Prior to Visit  Medication Sig Dispense Refill  . aspirin 81 MG EC tablet Take 1 tablet (81 mg total) by mouth daily.      [name]. BIDIL 20-37.5 MG per tablet TAKE 1 TABLET BY MOUTH TWICE A DAY  60 tablet  5  . calcitRIOL (ROCALTROL) 0.25 MCG capsule Take 0.25 mcg by mouth daily. PATIENT ONLY TAKES 3 DAYS A WEEK      . carvedilol (COREG) 25 MG tablet TAKE 1 TABLET BY MOUTH TWICE A DAY WITH A MEAL  60 tablet  5  . clopidogrel (PLAVIX) 75 MG tablet Take 1 tablet (75 mg total) by mouth daily.  30 tablet  11  . diphenoxylate-atropine (LOMOTIL) 2.5-0.025 MG per tablet Take 1 tablet by mouth 4 (four) times daily as needed for diarrhea or loose stools.  90 tablet  5  . epoetin alfa (EPOGEN,PROCRIT) 3000 UNIT/ML injection Inject 3,000 Units into the skin once.      . fluticasone (FLONASE) 50 MCG/ACT nasal spray Place 2 sprays into the nose daily.      . furosemide (LASIX) 40 MG tablet Take 1 tablet (40 mg total) by mouth daily.  30 tablet  6  . glucose blood (ONETOUCH VERIO) test strip Use as instructed. DX: 250.00  100 each  3  . methimazole (TAPAZOLE) 5 MG tablet take one tablet twice daily      . nitroGLYCERIN (NITROSTAT) 0.4 MG SL tablet Place 0.4 mg under the tongue every 5 (five) minutes as needed for chest pain.      Letta Pate. ONETOUCH DELICA LANCETS FINE MISC Use as instructed. DX: 250.00  100 each  3  . oxyCODONE (OXY IR/ROXICODONE) 5 MG immediate release tablet Take 1 tablet (5 mg total) by mouth as needed. Per Dr. Georgann HousekeeperNaser  40 tablet  0  . paregoric 2 MG/5ML solution Take 5 mLs by mouth 4 (four) times daily as needed for diarrhea or loose stools.      . sodium bicarbonate 325 MG tablet Take 650 mg by mouth 2 (two) times daily.        No current facility-administered medications on file prior to  visit.    Allergies  Allergen Reactions  . Diltiazem Hcl Hives and Swelling  . Nifedipine Hives and Swelling    Past Medical History  Diagnosis Date  . Diabetes mellitus   . Hypertension   . Chronic systolic heart failure     echo 04/12/11: Mild LVH, EF 30-35%, mid to distal anteroseptal and apical hypokinesis, grade 2 diastolic dysfunction, moderate LAE, mild RAE.  [name]. Hyperlipidemia   . Tobacco abuse   . CAD (coronary artery disease)     acute anterior STEMI, late presentation 04/06/11 LHC 3/5 demonstrated  severe ostial left main 95% stenosis and otherwise three-vessel CAD with an ejection fraction of 15%.  Emergent CABG: Dr. Dorris Fetch - Grafts: LIMA-LAD, SVG-D1, SVG-OM1, SVG-PDA and PL.  . Ischemic cardiomyopathy   . DM2 (diabetes mellitus, type 2)   . 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 Acute renal insufficiency     05/2011 admission (cr 1.54 at discharge)  . Anemia     Post-CABG  . Arthritis   . Stroke   . Sleep apnea   . Neuromuscular disorder     diabetic neuropathy  . Diarrhea     has had c diff diarrhea  . Weakness   . Bruises easily   . PAD (peripheral artery disease) 07/28/2012    Angiography 08/2012: Right: 90% focal stenosis in external iliac artery, long occlusion of SFA with 3 vessel run off . s/p slef expanding stent placement to external iliac artery. Left: borderline significant disease in comon and external iliac arteries. Diffuse 70-90% in SFA with 3 vessel run off.     Past Surgical History  Procedure Laterality Date  . Coronary artery bypass graft  04/06/2011    Procedure: CORONARY ARTERY BYPASS GRAFTING (CABG);  Surgeon: Loreli Slot, MD;  Location: [facility] OR;  Service: Open Heart Surgery;  Laterality: N/A;  Coronary Artery Bypass Graft times four utilizing the left internal mammary artery and the Right and left  greater Saphenous veins Harvested endoscopically.  . Fibroid bypass 04/06/11    . Colon surgery      rectal fissure  . Cervical  disc surgery  1997 - approximate  . Colonoscopy  07/29/2011    Procedure: COLONOSCOPY;  Surgeon: Rachael Fee, MD;  Location: [facility];  Service: Endoscopy;  Laterality: N/A;  . Eye surgery  2011    cataract removal    History  Smoking status  . Former Smoker -- 0.50 packs/day  . Types: Cigarettes  . Quit date: 04/06/2011  Smokeless tobacco  . Never Used    History  Alcohol Use No    Family History  Problem Relation Age of Onset  . Heart attack Mother     ?3s  . Diabetes Mother   . Heart attack Sister     43s  . Diabetes Sister     Dietitian  . Congestive Heart Failure Father     Reviw of Systems:  Reviewed in the HPI.  All other systems are negative.  Physical Exam: Blood pressure 146/60, pulse 80, height 5\' 8"  (1.727 m), weight 204 lb 9.6 oz (92.806 kg). General: Well developed, well nourished, in no acute distress.  He appears to be generally stronger than he was during his last several visits. Head: Normocephalic, atraumatic, sclera non-icteric, mucus membranes are moist,   Neck: Supple. Carotids are 2 + without bruits. No JVD  Lungs: Clear bilaterally to auscultation.  Heart: regular rate.  normal  S1 S2. No murmurs, gallops or rubs.    Abdomen: Soft, non-tender, non-distended with normal bowel sounds. No hepatomegaly. No rebound/guarding. No masses.  Msk:  Strength and tone are normal  Extremities: No clubbing or cyanosis. 1 + bilateral edema.  Distal pedal pulses are 2+ and equal bilaterally.  Neuro: Alert and oriented X 3. Moves all extremities spontaneously.  Psych:  Responds to questions appropriately with a normal affect.  ECG: July 06, 2013:  Sinus rhythm at 80 with 1st degree AV block   Assessment / Plan:

## 2013-07-06 NOTE — Assessment & Plan Note (Signed)
He's done very well. He's not having any episodes of chest pain.

## 2013-07-11 ENCOUNTER — Encounter (HOSPITAL_COMMUNITY)
Admission: RE | Admit: 2013-07-11 | Discharge: 2013-07-11 | Disposition: A | Discharge status: Home / Self Care | Payer: Medicare Other | Source: Ambulatory Visit | Attending: Nephrology | Admitting: Nephrology

## 2013-07-11 LAB — POCT HEMOGLOBIN-HEMACUE: HEMOGLOBIN: 10.1 g/dL — AB (ref 13.0–17.0)

## 2013-07-11 MED ORDER — EPOETIN ALFA 10000 UNIT/ML IJ SOLN
INTRAMUSCULAR | Status: AC
  Administered 2013-07-11: 10000 [IU] via SUBCUTANEOUS
  Filled 2013-07-11: qty 1

## 2013-07-11 MED ORDER — EPOETIN ALFA 3000 UNIT/ML IJ SOLN
3000.0000 [IU] | INTRAMUSCULAR | Status: DC
  Filled 2013-07-11: qty 1

## 2013-07-18 ENCOUNTER — Encounter (HOSPITAL_COMMUNITY)
Admission: RE | Admit: 2013-07-18 | Discharge: 2013-07-18 | Disposition: A | Discharge status: Home / Self Care | Payer: Medicare Other | Source: Ambulatory Visit | Attending: Nephrology | Admitting: Nephrology

## 2013-07-18 LAB — POCT HEMOGLOBIN-HEMACUE: Hemoglobin: 11.9 g/dL — ABNORMAL LOW (ref 13.0–17.0)

## 2013-07-18 MED ORDER — EPOETIN ALFA 3000 UNIT/ML IJ SOLN
3000.0000 [IU] | INTRAMUSCULAR | Status: DC
  Filled 2013-07-18: qty 1

## 2013-07-18 MED ORDER — EPOETIN ALFA 10000 UNIT/ML IJ SOLN
INTRAMUSCULAR | Status: AC
  Administered 2013-07-18: 3000 [IU] via SUBCUTANEOUS
  Filled 2013-07-18: qty 1

## 2013-07-24 ENCOUNTER — Other Ambulatory Visit (HOSPITAL_COMMUNITY): Payer: Self-pay | Admitting: Physician Assistant

## 2013-07-24 ENCOUNTER — Other Ambulatory Visit: Payer: Self-pay | Admitting: Cardiovascular Disease

## 2013-07-25 ENCOUNTER — Encounter (HOSPITAL_COMMUNITY)
Admission: RE | Admit: 2013-07-25 | Discharge: 2013-07-25 | Disposition: A | Discharge status: Home / Self Care | Payer: Medicare Other | Source: Ambulatory Visit | Attending: Nephrology | Admitting: Nephrology

## 2013-07-25 DIAGNOSIS — N184 Chronic kidney disease, stage 4 (severe): Secondary | ICD-10-CM | POA: Insufficient documentation

## 2013-07-25 DIAGNOSIS — D631 Anemia in chronic kidney disease: Secondary | ICD-10-CM | POA: Diagnosis present

## 2013-07-25 DIAGNOSIS — N039 Chronic nephritic syndrome with unspecified morphologic changes: Principal | ICD-10-CM

## 2013-07-25 LAB — POCT HEMOGLOBIN-HEMACUE: Hemoglobin: 10.7 g/dL — ABNORMAL LOW (ref 13.0–17.0)

## 2013-07-25 LAB — IRON AND TIBC
Iron: 37 ug/dL — ABNORMAL LOW (ref 42–135)
SATURATION RATIOS: 16 % — AB (ref 20–55)
TIBC: 226 ug/dL (ref 215–435)
UIBC: 189 ug/dL (ref 125–400)

## 2013-07-25 LAB — FERRITIN: Ferritin: 406 ng/mL — ABNORMAL HIGH (ref 22–322)

## 2013-07-25 MED ORDER — EPOETIN ALFA 3000 UNIT/ML IJ SOLN
3000.0000 [IU] | INTRAMUSCULAR | Status: DC
  Filled 2013-07-25: qty 1

## 2013-07-25 MED ORDER — EPOETIN ALFA 10000 UNIT/ML IJ SOLN
INTRAMUSCULAR | Status: AC
  Administered 2013-07-25: 10000 [IU] via SUBCUTANEOUS
  Filled 2013-07-25: qty 1

## 2013-08-01 ENCOUNTER — Encounter (HOSPITAL_COMMUNITY)
Admission: RE | Admit: 2013-08-01 | Discharge: 2013-08-01 | Disposition: A | Discharge status: Home / Self Care | Payer: Medicare Other | Source: Ambulatory Visit | Attending: Nephrology | Admitting: Nephrology

## 2013-08-01 DIAGNOSIS — D649 Anemia, unspecified: Secondary | ICD-10-CM | POA: Diagnosis not present

## 2013-08-01 LAB — POCT HEMOGLOBIN-HEMACUE: Hemoglobin: 11.4 g/dL — ABNORMAL LOW (ref 13.0–17.0)

## 2013-08-01 MED ORDER — EPOETIN ALFA 3000 UNIT/ML IJ SOLN
3000.0000 [IU] | INTRAMUSCULAR | Status: DC
  Filled 2013-08-01: qty 1

## 2013-08-01 MED ORDER — EPOETIN ALFA 10000 UNIT/ML IJ SOLN
INTRAMUSCULAR | Status: AC
  Administered 2013-08-01: 10000 [IU] via SUBCUTANEOUS
  Filled 2013-08-01: qty 1

## 2013-08-02 NOTE — Telephone Encounter (Signed)
This encounter was created in error - please disregard.

## 2013-08-06 ENCOUNTER — Encounter (HOSPITAL_COMMUNITY): Payer: Medicare Other

## 2013-08-06 ENCOUNTER — Ambulatory Visit (HOSPITAL_BASED_OUTPATIENT_CLINIC_OR_DEPARTMENT_OTHER): Payer: Medicare Other | Admitting: Cardiology

## 2013-08-06 ENCOUNTER — Ambulatory Visit (HOSPITAL_COMMUNITY): Payer: Medicare Other | Attending: Cardiovascular Disease | Admitting: Cardiology

## 2013-08-06 DIAGNOSIS — E785 Hyperlipidemia, unspecified: Secondary | ICD-10-CM | POA: Insufficient documentation

## 2013-08-06 DIAGNOSIS — I739 Peripheral vascular disease, unspecified: Secondary | ICD-10-CM | POA: Insufficient documentation

## 2013-08-06 DIAGNOSIS — L98499 Non-pressure chronic ulcer of skin of other sites with unspecified severity: Secondary | ICD-10-CM | POA: Insufficient documentation

## 2013-08-06 DIAGNOSIS — I2510 Atherosclerotic heart disease of native coronary artery without angina pectoris: Secondary | ICD-10-CM | POA: Insufficient documentation

## 2013-08-06 DIAGNOSIS — I10 Essential (primary) hypertension: Secondary | ICD-10-CM | POA: Insufficient documentation

## 2013-08-06 DIAGNOSIS — I700 Atherosclerosis of aorta: Secondary | ICD-10-CM

## 2013-08-06 DIAGNOSIS — E119 Type 2 diabetes mellitus without complications: Secondary | ICD-10-CM | POA: Insufficient documentation

## 2013-08-06 DIAGNOSIS — Z87891 Personal history of nicotine dependence: Secondary | ICD-10-CM | POA: Insufficient documentation

## 2013-08-06 NOTE — Progress Notes (Signed)
Abdominal aorta duplex performed  

## 2013-08-06 NOTE — Progress Notes (Signed)
ABI performed  

## 2013-08-08 ENCOUNTER — Encounter (HOSPITAL_COMMUNITY)
Admission: RE | Admit: 2013-08-08 | Discharge: 2013-08-08 | Disposition: A | Discharge status: Home / Self Care | Payer: Medicare Other | Source: Ambulatory Visit | Attending: Nephrology | Admitting: Nephrology

## 2013-08-08 DIAGNOSIS — D649 Anemia, unspecified: Secondary | ICD-10-CM | POA: Diagnosis not present

## 2013-08-08 MED ORDER — EPOETIN ALFA 10000 UNIT/ML IJ SOLN
INTRAMUSCULAR | Status: AC
  Administered 2013-08-08: 10000 [IU] via SUBCUTANEOUS
  Filled 2013-08-08: qty 1

## 2013-08-08 MED ORDER — EPOETIN ALFA 3000 UNIT/ML IJ SOLN
3000.0000 [IU] | INTRAMUSCULAR | Status: DC
  Filled 2013-08-08: qty 1

## 2013-08-09 LAB — POCT HEMOGLOBIN-HEMACUE: Hemoglobin: 10.3 g/dL — ABNORMAL LOW (ref 13.0–17.0)

## 2013-08-15 ENCOUNTER — Encounter (HOSPITAL_COMMUNITY)
Admission: RE | Admit: 2013-08-15 | Discharge: 2013-08-15 | Disposition: A | Discharge status: Home / Self Care | Payer: Medicare Other | Source: Ambulatory Visit | Attending: Nephrology | Admitting: Nephrology

## 2013-08-15 DIAGNOSIS — D649 Anemia, unspecified: Secondary | ICD-10-CM | POA: Diagnosis not present

## 2013-08-15 LAB — POCT HEMOGLOBIN-HEMACUE: Hemoglobin: 10.1 g/dL — ABNORMAL LOW (ref 13.0–17.0)

## 2013-08-15 MED ORDER — EPOETIN ALFA 10000 UNIT/ML IJ SOLN
INTRAMUSCULAR | Status: AC
  Administered 2013-08-15: 3000 [IU] via SUBCUTANEOUS
  Filled 2013-08-15: qty 1

## 2013-08-15 MED ORDER — EPOETIN ALFA 3000 UNIT/ML IJ SOLN
3000.0000 [IU] | INTRAMUSCULAR | Status: DC
  Filled 2013-08-15: qty 1

## 2013-08-17 ENCOUNTER — Other Ambulatory Visit: Payer: Self-pay | Admitting: Cardiovascular Disease

## 2013-08-17 ENCOUNTER — Other Ambulatory Visit (HOSPITAL_COMMUNITY): Payer: Self-pay | Admitting: Physician Assistant

## 2013-08-22 ENCOUNTER — Encounter (HOSPITAL_COMMUNITY)
Admission: RE | Admit: 2013-08-22 | Discharge: 2013-08-22 | Disposition: A | Discharge status: Home / Self Care | Payer: Medicare Other | Source: Ambulatory Visit | Attending: Nephrology | Admitting: Nephrology

## 2013-08-22 DIAGNOSIS — D649 Anemia, unspecified: Secondary | ICD-10-CM | POA: Diagnosis not present

## 2013-08-22 LAB — IRON AND TIBC
IRON: 44 ug/dL (ref 42–135)
SATURATION RATIOS: 18 % — AB (ref 20–55)
TIBC: 240 ug/dL (ref 215–435)
UIBC: 196 ug/dL (ref 125–400)

## 2013-08-22 LAB — FERRITIN: Ferritin: 364 ng/mL — ABNORMAL HIGH (ref 22–322)

## 2013-08-22 LAB — POCT HEMOGLOBIN-HEMACUE: Hemoglobin: 11.2 g/dL — ABNORMAL LOW (ref 13.0–17.0)

## 2013-08-22 MED ORDER — EPOETIN ALFA 10000 UNIT/ML IJ SOLN
INTRAMUSCULAR | Status: AC
  Administered 2013-08-22: 10000 [IU] via SUBCUTANEOUS
  Filled 2013-08-22: qty 1

## 2013-08-22 MED ORDER — EPOETIN ALFA 3000 UNIT/ML IJ SOLN
3000.0000 [IU] | INTRAMUSCULAR | Status: DC
  Filled 2013-08-22: qty 1

## 2013-08-29 ENCOUNTER — Encounter (HOSPITAL_COMMUNITY)
Admission: RE | Admit: 2013-08-29 | Discharge: 2013-08-29 | Disposition: A | Discharge status: Home / Self Care | Payer: Medicare Other | Source: Ambulatory Visit | Attending: Nephrology | Admitting: Nephrology

## 2013-08-29 DIAGNOSIS — D649 Anemia, unspecified: Secondary | ICD-10-CM | POA: Diagnosis not present

## 2013-08-29 LAB — POCT HEMOGLOBIN-HEMACUE: Hemoglobin: 11 g/dL — ABNORMAL LOW (ref 13.0–17.0)

## 2013-08-29 MED ORDER — EPOETIN ALFA 3000 UNIT/ML IJ SOLN
3000.0000 [IU] | INTRAMUSCULAR | Status: DC
  Filled 2013-08-29: qty 1

## 2013-08-29 MED ORDER — EPOETIN ALFA 10000 UNIT/ML IJ SOLN
INTRAMUSCULAR | Status: AC
  Administered 2013-08-29: 10000 [IU] via SUBCUTANEOUS
  Filled 2013-08-29: qty 1

## 2013-09-05 ENCOUNTER — Encounter (HOSPITAL_COMMUNITY)
Admission: RE | Admit: 2013-09-05 | Discharge: 2013-09-05 | Disposition: A | Discharge status: Home / Self Care | Payer: Medicare Other | Source: Ambulatory Visit | Attending: Nephrology | Admitting: Nephrology

## 2013-09-05 DIAGNOSIS — D638 Anemia in other chronic diseases classified elsewhere: Secondary | ICD-10-CM | POA: Insufficient documentation

## 2013-09-05 DIAGNOSIS — N183 Chronic kidney disease, stage 3 unspecified: Secondary | ICD-10-CM | POA: Diagnosis not present

## 2013-09-05 LAB — POCT HEMOGLOBIN-HEMACUE: Hemoglobin: 10.9 g/dL — ABNORMAL LOW (ref 13.0–17.0)

## 2013-09-05 MED ORDER — EPOETIN ALFA 10000 UNIT/ML IJ SOLN
INTRAMUSCULAR | Status: AC
  Administered 2013-09-05: 3000 [IU] via SUBCUTANEOUS
  Filled 2013-09-05: qty 1

## 2013-09-05 MED ORDER — EPOETIN ALFA 3000 UNIT/ML IJ SOLN
3000.0000 [IU] | INTRAMUSCULAR | Status: DC
  Filled 2013-09-05: qty 1

## 2013-09-12 ENCOUNTER — Encounter (HOSPITAL_COMMUNITY)
Admission: RE | Admit: 2013-09-12 | Discharge: 2013-09-12 | Disposition: A | Discharge status: Home / Self Care | Payer: Medicare Other | Source: Ambulatory Visit | Attending: Nephrology | Admitting: Nephrology

## 2013-09-12 DIAGNOSIS — D638 Anemia in other chronic diseases classified elsewhere: Secondary | ICD-10-CM | POA: Diagnosis not present

## 2013-09-12 LAB — POCT HEMOGLOBIN-HEMACUE: Hemoglobin: 10.3 g/dL — ABNORMAL LOW (ref 13.0–17.0)

## 2013-09-12 MED ORDER — EPOETIN ALFA 10000 UNIT/ML IJ SOLN
INTRAMUSCULAR | Status: AC
  Administered 2013-09-12: 10000 [IU] via SUBCUTANEOUS
  Filled 2013-09-12: qty 1

## 2013-09-12 MED ORDER — EPOETIN ALFA 3000 UNIT/ML IJ SOLN
3000.0000 [IU] | INTRAMUSCULAR | Status: DC
  Filled 2013-09-12: qty 1

## 2013-09-16 ENCOUNTER — Other Ambulatory Visit: Payer: Self-pay | Admitting: Family Medicine

## 2013-09-16 ENCOUNTER — Other Ambulatory Visit: Payer: Self-pay | Admitting: Cardiovascular Disease

## 2013-09-19 ENCOUNTER — Encounter (HOSPITAL_COMMUNITY)
Admission: RE | Admit: 2013-09-19 | Discharge: 2013-09-19 | Disposition: A | Discharge status: Home / Self Care | Payer: Medicare Other | Source: Ambulatory Visit | Attending: Nephrology | Admitting: Nephrology

## 2013-09-19 DIAGNOSIS — D638 Anemia in other chronic diseases classified elsewhere: Secondary | ICD-10-CM | POA: Diagnosis not present

## 2013-09-19 LAB — IRON AND TIBC
Iron: 48 ug/dL (ref 42–135)
SATURATION RATIOS: 19 % — AB (ref 20–55)
TIBC: 251 ug/dL (ref 215–435)
UIBC: 203 ug/dL (ref 125–400)

## 2013-09-19 LAB — POCT HEMOGLOBIN-HEMACUE: HEMOGLOBIN: 10.4 g/dL — AB (ref 13.0–17.0)

## 2013-09-19 LAB — FERRITIN: FERRITIN: 322 ng/mL (ref 22–322)

## 2013-09-19 MED ORDER — EPOETIN ALFA 10000 UNIT/ML IJ SOLN
INTRAMUSCULAR | Status: AC
  Administered 2013-09-19: 3000 [IU] via SUBCUTANEOUS
  Filled 2013-09-19: qty 1

## 2013-09-19 MED ORDER — EPOETIN ALFA 3000 UNIT/ML IJ SOLN
3000.0000 [IU] | INTRAMUSCULAR | Status: DC
  Filled 2013-09-19: qty 1

## 2013-09-26 ENCOUNTER — Encounter (HOSPITAL_COMMUNITY)
Admission: RE | Admit: 2013-09-26 | Discharge: 2013-09-26 | Disposition: A | Discharge status: Home / Self Care | Payer: Medicare Other | Source: Ambulatory Visit | Attending: Nephrology | Admitting: Nephrology

## 2013-09-26 DIAGNOSIS — D638 Anemia in other chronic diseases classified elsewhere: Secondary | ICD-10-CM | POA: Diagnosis not present

## 2013-09-26 LAB — POCT HEMOGLOBIN-HEMACUE: Hemoglobin: 11.9 g/dL — ABNORMAL LOW (ref 13.0–17.0)

## 2013-09-26 MED ORDER — EPOETIN ALFA 3000 UNIT/ML IJ SOLN
3000.0000 [IU] | INTRAMUSCULAR | Status: DC
  Filled 2013-09-26: qty 1

## 2013-09-26 MED ORDER — EPOETIN ALFA 10000 UNIT/ML IJ SOLN
INTRAMUSCULAR | Status: AC
  Administered 2013-09-26: 3000 [IU]
  Filled 2013-09-26: qty 1

## 2013-10-01 ENCOUNTER — Other Ambulatory Visit: Payer: Self-pay | Admitting: Family Medicine

## 2013-10-01 NOTE — Telephone Encounter (Signed)
Refill OK

## 2013-10-01 NOTE — Telephone Encounter (Signed)
Last visit 04/13/13 Last refill 04/13/13 #90 5 refills

## 2013-10-03 ENCOUNTER — Encounter (HOSPITAL_COMMUNITY)
Admission: RE | Admit: 2013-10-03 | Discharge: 2013-10-03 | Disposition: A | Discharge status: Home / Self Care | Payer: Medicare Other | Source: Ambulatory Visit | Attending: Nephrology | Admitting: Nephrology

## 2013-10-03 DIAGNOSIS — D649 Anemia, unspecified: Secondary | ICD-10-CM | POA: Insufficient documentation

## 2013-10-03 DIAGNOSIS — N183 Chronic kidney disease, stage 3 unspecified: Secondary | ICD-10-CM | POA: Insufficient documentation

## 2013-10-03 LAB — POCT HEMOGLOBIN-HEMACUE: Hemoglobin: 10.5 g/dL — ABNORMAL LOW (ref 13.0–17.0)

## 2013-10-03 MED ORDER — EPOETIN ALFA 10000 UNIT/ML IJ SOLN
INTRAMUSCULAR | Status: AC
  Administered 2013-10-03: 10000 [IU] via SUBCUTANEOUS
  Filled 2013-10-03: qty 1

## 2013-10-03 MED ORDER — EPOETIN ALFA 3000 UNIT/ML IJ SOLN
3000.0000 [IU] | INTRAMUSCULAR | Status: DC
  Filled 2013-10-03: qty 1

## 2013-10-09 ENCOUNTER — Ambulatory Visit (INDEPENDENT_AMBULATORY_CARE_PROVIDER_SITE_OTHER): Payer: Medicare Other | Admitting: Cardiovascular Disease

## 2013-10-09 ENCOUNTER — Encounter: Payer: Self-pay | Admitting: Cardiovascular Disease

## 2013-10-09 VITALS — BP 146/68 | HR 78 | Ht 68.0 in | Wt 199.6 lb

## 2013-10-09 DIAGNOSIS — I2510 Atherosclerotic heart disease of native coronary artery without angina pectoris: Secondary | ICD-10-CM

## 2013-10-09 DIAGNOSIS — I5022 Chronic systolic (congestive) heart failure: Secondary | ICD-10-CM

## 2013-10-09 NOTE — Assessment & Plan Note (Addendum)
Due to ischemic cardiomyopathy.  His CHF is well controlled.  He is limited by his anemia and chronic diarrhea.  . BP is OK  Continue current meds.  Will see him in 6 months.

## 2013-10-09 NOTE — Progress Notes (Signed)
Kurt Richard Date of Birth  02/16/1945 [facility]     [place] [facility]  [address]    [address]   [address] [place], [place]  [address]    [place], [place]  [address] [phone]  Fax  [phone]  [phone]  Fax [phone]  Problem list: 1. Acute on chronic systolic CHF  Echo Feb. 2014 Left ventricle: The cavity size was normal.  moderate LVH.  The estimated ejection fraction was in the range of 35% to 40%. There is moderate hypokinesis of the mid-distalanteroseptal myocardium.  pseudonormal left ventricular filling pattern, with concomitant abnormal relaxation and increased filling pressure (grade 2 diastolic dysfunction). - Aortic valve: There was very mild stenosis. Mean gradient:39mm Hg (S). Peak gradient: 16mm Hg (S). - Mitral valve: Calcified annulus. Mildly thickened leaflets .- Left atrium: The atrium was moderately dilated. - Right ventricle: The cavity size was mildly dilated. - Right atrium: The atrium was mildly dilated.  2. C. Diff colitis. 3. Acute renal insufficiency, d/c Cr 1.54  4. CAD - recent late-presenting anterior MI 04/2011 with 3V dz s/p CABG   - postop course 04/2011 complicated by LV dysfunction requiring IABP/inotropes, ABL anemia, CVA  5.  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 Diabetes mellitus  7. Tobacco abuse  8. hypertension 9. hyperlipidemia  History of Present Illness:  Kurt Richard is a 68 year old gentleman with the above-noted medical history.  He is still having problems with his abdomen and the diarrhea.  The last stool sample was negative for c. Diff. He needs to have another ERCP at [facility]. He has done well from a cardiac standpoint.  No further exacerbations of CHf.  No angina.  April 13, 2012: [name] is doing better.  He saw [name] for pre-op evaluation prior gallbladder surgery. He had an echocardiogram which revealed some improvement of his left ventricular systolic function. His ejection fraction is now 35-40%.  His  preop labs also revealed that he has chronic renal insufficiency with a creatinine of 2.3. His BUN is also elevated. In addition, he also has anemia.  His Lasix was decreased to 40 mg 3 times a week after seeing his elevated creatinine.  His breathing remains the same. He occasionally will take an extra Lasix if he develops chest congestion.  Jun 29, 2012:  He's not having episodes of chest pain or shortness of breath. Adma is making some slow progress.  He is not able to exercise because of left calf pain. He is only able to stand on his legs for several minutes.  He also has neuropathy which limits his exercise. He has not been eating any extra salt.   His baseline creatinine is 2.2.  He does not want to start on dialysis.    Sept. 5, 2014:  He has done well.  His Hb is still low.  He goes for Procrit injection every week and iron infusions on occasion.  No obvious blood in his stool.  He has had peripheral arterial evaluation and stenting of his left iliac artery:  Conclusions:  1. Severe focal stenosis in the distal left external iliac artery. Moderate to significant diffuse disease in the distal right common iliac artery and right external iliac artery.  2. Occluded left SFA in a long segment with collaterals from the profunda. Three-vessel runoff below the knee.  3. Diffusely diseased right SFA throughout its course with three-vessel runoff below the knee.  4. Successful self-expanding stent placement to the left external iliac artery.  He was started back on the plavix.  He still eats an unrestricted diet - still eats carbs and fats as well as some extra salt.   Dec. 4, 2014:  [name] is doing well.  BP at home is normal.  No CP or dyspnea.   He's not eating much salt. He avoids hot dogs, and other salty foods. He has seen Dr. Hyman Hopes recently.  He received Procrit weekly.   They have discussed starting dialysis.   July 06, 2013:  [name] is doing ok  he's been having some leg  edema. He doubled up on his Lasix for about a week and his leg edema has almost completely resolved. He's having problems with anemia. His hemoglobin is now finally 11.1.  Sept. 8, 2015:  [name] has been doing well from  a CHF standpoint He has seen Dr. Kirke Corin who is considering a stent in right leg for PVD He is still battling anemia.   He still has diarrhea - typically after eating.   Not related to lactose,  He has been tested for Celiac ( negative)     Current Outpatient Prescriptions on File Prior to Visit  Medication Sig Dispense Refill  . aspirin 81 MG EC tablet Take 1 tablet (81 mg total) by mouth daily.      . calcitRIOL (ROCALTROL) 0.25 MCG capsule Take 0.25 mcg by mouth daily. PATIENT ONLY TAKES 3 DAYS A WEEK      . carvedilol (COREG) 25 MG tablet TAKE 1 TABLET BY MOUTH TWICE A DAY WITH A MEAL  60 tablet  2  . clopidogrel (PLAVIX) 75 MG tablet TAKE 1 TABLET EVERY DAY  30 tablet  6  . diphenoxylate-atropine (LOMOTIL) 2.5-0.025 MG per tablet TAKE 1 TABLET 4 TIMES A DAY AS NEEDED FOR DIARRHEA OR LOOSE STOOLS  90 tablet  0  . epoetin alfa (EPOGEN,PROCRIT) 3000 UNIT/ML injection Inject 3,000 Units into the skin once.      . fluticasone (FLONASE) 50 MCG/ACT nasal spray Place 2 sprays into the nose daily.      . furosemide (LASIX) 40 MG tablet Take one tablet daily and may take one extra as needed  110 tablet  3  . glucose blood (ONETOUCH VERIO) test strip Use as instructed. DX: 250.00  100 each  3  . isosorbide-hydrALAZINE (BIDIL) 20-37.5 MG per tablet Take one tablet three times a day  90 tablet  6  . methimazole (TAPAZOLE) 5 MG tablet take one tablet twice daily      . nitroGLYCERIN (NITROSTAT) 0.4 MG SL tablet Place 0.4 mg under the tongue every 5 (five) minutes as needed for chest pain.      Letta Pate DELICA LANCETS FINE MISC Use as instructed. DX: 250.00  100 each  3  . oxyCODONE (OXY IR/ROXICODONE) 5 MG immediate release tablet Take 1 tablet (5 mg total) by mouth as needed. Per  Dr. Georgann Housekeeper  40 tablet  0  . paregoric 2 MG/5ML solution Take 5 mLs by mouth 4 (four) times daily as needed for diarrhea or loose stools.      . sodium bicarbonate 325 MG tablet Take 650 mg by mouth 2 (two) times daily.        No current facility-administered medications on file prior to visit.    Allergies  Allergen Reactions  . Diltiazem Hcl Hives and Swelling  . Nifedipine Hives and Swelling    Past Medical History  Diagnosis Date  . Diabetes mellitus   . Hypertension   .  Chronic systolic heart failure     echo 04/12/11: Mild LVH, EF 30-35%, mid to distal anteroseptal and apical hypokinesis, grade 2 diastolic dysfunction, moderate LAE, mild RAE.  [name] Hyperlipidemia   . Tobacco abuse   . CAD (coronary artery disease)     acute anterior STEMI, late presentation 04/06/11 LHC 3/5 demonstrated severe ostial left main 95% stenosis and otherwise three-vessel CAD with an ejection fraction of 15%.  Emergent CABG: Dr. Dorris Fetch - Grafts: LIMA-LAD, SVG-D1, SVG-OM1, SVG-PDA and PL.  . Ischemic cardiomyopathy   . DM2 (diabetes mellitus, type 2)   . 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 Acute renal insufficiency     05/2011 admission (cr 1.54 at discharge)  . Anemia     Post-CABG  . Arthritis   . Stroke   . Sleep apnea   . Neuromuscular disorder     diabetic neuropathy  . Diarrhea     has had c diff diarrhea  . Weakness   . Bruises easily   . PAD (peripheral artery disease) 07/28/2012    Angiography 08/2012: Right: 90% focal stenosis in external iliac artery, long occlusion of SFA with 3 vessel run off . s/p slef expanding stent placement to external iliac artery. Left: borderline significant disease in comon and external iliac arteries. Diffuse 70-90% in SFA with 3 vessel run off.     Past Surgical History  Procedure Laterality Date  . Coronary artery bypass graft  04/06/2011    Procedure: CORONARY ARTERY BYPASS GRAFTING (CABG);  Surgeon: Loreli Slot, MD;  Location: [facility]  OR;  Service: Open Heart Surgery;  Laterality: N/A;  Coronary Artery Bypass Graft times four utilizing the left internal mammary artery and the Right and left  greater Saphenous veins Harvested endoscopically.  . Fibroid bypass 04/06/11    . Colon surgery      rectal fissure  . Cervical disc surgery  1997 - approximate  . Colonoscopy  07/29/2011    Procedure: COLONOSCOPY;  Surgeon: Rachael Fee, MD;  Location: [facility] ENDOSCOPY;  Service: Endoscopy;  Laterality: N/A;  . Eye surgery  2011    cataract removal    History  Smoking status  . Former Smoker -- 0.50 packs/day  . Types: Cigarettes  . Quit date: 04/06/2011  Smokeless tobacco  . Never Used    History  Alcohol Use No    Family History  Problem Relation Age of Onset  . Heart attack Mother     ?70s  . Diabetes Mother   . Heart attack Sister     47s  . Diabetes Sister     Dietitian  . Congestive Heart Failure Father     Reviw of Systems:  Reviewed in the HPI.  All other systems are negative.  Physical Exam: Blood pressure 146/68, pulse 78, height  (1.727 m), weight 199 lb 9.6 oz (90.538 kg). General: Well developed, well nourished, in no acute distress.  He appears to be generally stronger than he was during his last several visits. Head: Normocephalic, atraumatic, sclera non-icteric, mucus membranes are moist,   Neck: Supple. Carotids are 2 + without bruits. No JVD  Lungs: Clear bilaterally to auscultation.  Heart: regular rate.  normal  S1 S2. No murmurs, gallops or rubs.    Abdomen: Soft, non-tender, non-distended with normal bowel sounds. No hepatomegaly. No rebound/guarding. No masses.  Msk:  Strength and tone are normal  Extremities: No clubbing or cyanosis. 1 + bilateral edema.  Distal pedal  pulses are 2+ and equal bilaterally.  Neuro: Alert and oriented X 3. Moves all extremities spontaneously.  Psych:  Responds to questions appropriately with a normal affect.  ECG: July 06, 2013:  Sinus rhythm at  80 with 1st degree AV block   Assessment / Plan:

## 2013-10-09 NOTE — Assessment & Plan Note (Signed)
No angina.  sTable

## 2013-10-09 NOTE — Patient Instructions (Signed)
Your physician recommends that you continue on your current medications as directed. Please refer to the Current Medication list given to you today.  Your physician wants you to follow-up in: 6 months with Dr. Nahser.  You will receive a reminder letter in the mail two months in advance. If you don't receive a letter, please call our office to schedule the follow-up appointment.  

## 2013-10-10 ENCOUNTER — Encounter (HOSPITAL_COMMUNITY)
Admission: RE | Admit: 2013-10-10 | Discharge: 2013-10-10 | Disposition: A | Discharge status: Home / Self Care | Payer: Medicare Other | Source: Ambulatory Visit | Attending: Nephrology | Admitting: Nephrology

## 2013-10-10 DIAGNOSIS — D649 Anemia, unspecified: Secondary | ICD-10-CM | POA: Diagnosis not present

## 2013-10-10 LAB — POCT HEMOGLOBIN-HEMACUE: HEMOGLOBIN: 10.7 g/dL — AB (ref 13.0–17.0)

## 2013-10-10 MED ORDER — EPOETIN ALFA 10000 UNIT/ML IJ SOLN
INTRAMUSCULAR | Status: AC
  Filled 2013-10-10: qty 1

## 2013-10-10 MED ORDER — EPOETIN ALFA 10000 UNIT/ML IJ SOLN
INTRAMUSCULAR | Status: AC
  Administered 2013-10-10: 10000 [IU] via SUBCUTANEOUS
  Filled 2013-10-10: qty 1

## 2013-10-10 MED ORDER — EPOETIN ALFA 3000 UNIT/ML IJ SOLN
3000.0000 [IU] | INTRAMUSCULAR | Status: DC
  Filled 2013-10-10: qty 1

## 2013-10-17 ENCOUNTER — Encounter (HOSPITAL_COMMUNITY)
Admission: RE | Admit: 2013-10-17 | Discharge: 2013-10-17 | Disposition: A | Discharge status: Home / Self Care | Payer: Medicare Other | Source: Ambulatory Visit | Attending: Nephrology | Admitting: Nephrology

## 2013-10-17 ENCOUNTER — Encounter: Payer: Self-pay | Admitting: Family Medicine

## 2013-10-17 ENCOUNTER — Ambulatory Visit (INDEPENDENT_AMBULATORY_CARE_PROVIDER_SITE_OTHER): Payer: Medicare Other | Admitting: Family Medicine

## 2013-10-17 VITALS — BP 140/70 | HR 70 | Temp 97.9°F | Wt 199.0 lb

## 2013-10-17 DIAGNOSIS — E0590 Thyrotoxicosis, unspecified without thyrotoxic crisis or storm: Secondary | ICD-10-CM | POA: Insufficient documentation

## 2013-10-17 DIAGNOSIS — E785 Hyperlipidemia, unspecified: Secondary | ICD-10-CM

## 2013-10-17 DIAGNOSIS — E1122 Type 2 diabetes mellitus with diabetic chronic kidney disease: Secondary | ICD-10-CM

## 2013-10-17 DIAGNOSIS — Z23 Encounter for immunization: Secondary | ICD-10-CM

## 2013-10-17 DIAGNOSIS — K529 Noninfective gastroenteritis and colitis, unspecified: Secondary | ICD-10-CM

## 2013-10-17 DIAGNOSIS — D649 Anemia, unspecified: Secondary | ICD-10-CM | POA: Diagnosis not present

## 2013-10-17 DIAGNOSIS — I10 Essential (primary) hypertension: Secondary | ICD-10-CM

## 2013-10-17 DIAGNOSIS — N183 Chronic kidney disease, stage 3 unspecified: Secondary | ICD-10-CM

## 2013-10-17 DIAGNOSIS — R197 Diarrhea, unspecified: Secondary | ICD-10-CM

## 2013-10-17 DIAGNOSIS — N189 Chronic kidney disease, unspecified: Secondary | ICD-10-CM

## 2013-10-17 DIAGNOSIS — E1129 Type 2 diabetes mellitus with other diabetic kidney complication: Secondary | ICD-10-CM

## 2013-10-17 LAB — POCT HEMOGLOBIN-HEMACUE: Hemoglobin: 10.6 g/dL — ABNORMAL LOW (ref 13.0–17.0)

## 2013-10-17 LAB — IRON AND TIBC
Iron: 40 ug/dL — ABNORMAL LOW (ref 42–135)
SATURATION RATIOS: 17 % — AB (ref 20–55)
TIBC: 232 ug/dL (ref 215–435)
UIBC: 192 ug/dL (ref 125–400)

## 2013-10-17 LAB — HEMOGLOBIN A1C: Hgb A1c MFr Bld: 6.1 % (ref 4.6–6.5)

## 2013-10-17 MED ORDER — EPOETIN ALFA 10000 UNIT/ML IJ SOLN
INTRAMUSCULAR | Status: AC
  Administered 2013-10-17: 3000 [IU] via SUBCUTANEOUS
  Filled 2013-10-17: qty 1

## 2013-10-17 MED ORDER — EPOETIN ALFA 3000 UNIT/ML IJ SOLN
3000.0000 [IU] | INTRAMUSCULAR | Status: DC
  Filled 2013-10-17: qty 1

## 2013-10-17 MED ORDER — PAREGORIC 2 MG/5ML PO TINC: 5.0000 mL | Freq: Four times a day (QID) | ORAL | Status: DC | PRN

## 2013-10-17 NOTE — Progress Notes (Signed)
Pre visit review using our clinic review tool, if applicable. No additional management support is needed unless otherwise documented below in the visit note. 

## 2013-10-17 NOTE — Progress Notes (Signed)
Subjective:    Patient ID: Kurt Richard, male    DOB: 1945-04-07, 68 y.o.   MRN: [id number]  HPI Medical followup. Patient has complex past medical history with history of CAD, peripheral vascular disease, systolic heart failure, hypertension, hyperlipidemia, chronic kidney disease, anemia, chronic diarrhea, and type 2 diabetes. His blood sugars been relatively stable currently not on medication. Last A1c 6.6%. No symptoms of polyuria.  Continues to have diarrhea issues especially within several minutes after eating. No specific food triggers. He had extensive GI workup. Only thing that seems to help much is paregoric and requesting refill. This was prescribed initially by GI. No recent bloody stools. Weight stable.  He has history of hyperthyroidism and has been taking methimazole. No recent thyroid functions.  Denies any recent chest pains. He has peripheral vascular disease with schedule arterial Doppler lower extremity soon. He notices some claudication symptoms right lower extremity greater than left  Past Medical History  Diagnosis Date  . Diabetes mellitus   . Hypertension   . Chronic systolic heart failure     echo 04/12/11: Mild LVH, EF 30-35%, mid to distal anteroseptal and apical hypokinesis, grade 2 diastolic dysfunction, moderate LAE, mild RAE.  [name] Hyperlipidemia   . Tobacco abuse   . CAD (coronary artery disease)     acute anterior STEMI, late presentation 04/06/11 LHC 3/5 demonstrated severe ostial left main 95% stenosis and otherwise three-vessel CAD with an ejection fraction of 15%.  Emergent CABG: Dr. Dorris Fetch - Grafts: LIMA-LAD, SVG-D1, SVG-OM1, SVG-PDA and PL.  . Ischemic cardiomyopathy   . DM2 (diabetes mellitus, type 2)   . 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 Acute renal insufficiency     05/2011 admission (cr 1.54 at discharge)  . Anemia     Post-CABG  . Arthritis   . Stroke   . Sleep apnea   . Neuromuscular disorder     diabetic neuropathy  .  Diarrhea     has had c diff diarrhea  . Weakness   . Bruises easily   . PAD (peripheral artery disease) 07/28/2012    Angiography 08/2012: Right: 90% focal stenosis in external iliac artery, long occlusion of SFA with 3 vessel run off . s/p slef expanding stent placement to external iliac artery. Left: borderline significant disease in comon and external iliac arteries. Diffuse 70-90% in SFA with 3 vessel run off.    Past Surgical History  Procedure Laterality Date  . Coronary artery bypass graft  04/06/2011    Procedure: CORONARY ARTERY BYPASS GRAFTING (CABG);  Surgeon: Loreli Slot, MD;  Location: [facility] OR;  Service: Open Heart Surgery;  Laterality: N/A;  Coronary Artery Bypass Graft times four utilizing the left internal mammary artery and the Right and left  greater Saphenous veins Harvested endoscopically.  . Fibroid bypass 04/06/11    . Colon surgery      rectal fissure  . Cervical disc surgery  1997 - approximate  . Colonoscopy  07/29/2011    Procedure: COLONOSCOPY;  Surgeon: Rachael Fee, MD;  Location: [facility] END[facility]COPY;  Service: Endoscopy;  Laterality: N/A;  . Eye surgery  2011    cataract removal    reports that he quit smoking about 2 years ago. His smoking use included Cigarettes. He smoked 0.50 packs per day. He has never used smokeless tobacco. He reports that he does not drink alcohol or use illicit drugs. family history includes Congestive Heart Failure in his father; Diabetes in his  mother and sister; Heart attack in his mother and sister. Allergies  Allergen Reactions  . Diltiazem Hcl Hives and Swelling  . Nifedipine Hives and Swelling      Review of Systems  Constitutional: Negative for appetite change and unexpected weight change.  Respiratory: Positive for shortness of breath. Negative for cough.   Cardiovascular: Negative for chest pain.  Gastrointestinal: Negative for abdominal pain.  Endocrine: Negative for polydipsia and polyuria.  Musculoskeletal: Positive  for arthralgias and back pain.       Objective:   Physical Exam  Constitutional: He is oriented to person, place, and time. He appears well-developed and well-nourished.  Cardiovascular: Normal rate.   Pulmonary/Chest: Effort normal and breath sounds normal. No respiratory distress. He has no wheezes. He has no rales.  Musculoskeletal:  Trace edema lower legs bilaterally  Neurological: He is alert and oriented to person, place, and time.  Skin:  No foot lesions. Poorly palpated dorsalis pedis pulses bilaterally. Right foot is slightly cooler to touch compared with left. No calluses. Normal sensory function          Assessment & Plan:  #1 history of type 2 diabetes. Currently not treated medically. Recheck A1c #2 history of hyperthyroidism. Recheck TSH and free T4. If possible, taper back methimazole #3 history of chronic diarrhea. Had extensive workup per GI the past. Refilled paregoric for as needed use. #4 hyperlipidemia. Check lipid and hepatic panel #5 chronic kidney disease. Followed by nephrology #6 health maintenance. Flu vaccine given. No contraindications

## 2013-10-18 ENCOUNTER — Telehealth: Payer: Self-pay

## 2013-10-18 ENCOUNTER — Other Ambulatory Visit: Payer: Self-pay | Admitting: Family Medicine

## 2013-10-18 DIAGNOSIS — R7989 Other specified abnormal findings of blood chemistry: Secondary | ICD-10-CM

## 2013-10-18 LAB — CBC WITH DIFFERENTIAL/PLATELET
Basophils Absolute: 0 10*3/uL (ref 0.0–0.1)
Basophils Relative: 0.4 % (ref 0.0–3.0)
Eosinophils Absolute: 0.3 10*3/uL (ref 0.0–0.7)
Eosinophils Relative: 4.1 % (ref 0.0–5.0)
HCT: 32.3 % — ABNORMAL LOW (ref 39.0–52.0)
HEMOGLOBIN: 10.7 g/dL — AB (ref 13.0–17.0)
Lymphocytes Relative: 11.4 % — ABNORMAL LOW (ref 12.0–46.0)
Lymphs Abs: 0.9 10*3/uL (ref 0.7–4.0)
MCHC: 33.1 g/dL (ref 30.0–36.0)
MCV: 92.7 fl (ref 78.0–100.0)
Monocytes Absolute: 0.7 10*3/uL (ref 0.1–1.0)
Monocytes Relative: 8.1 % (ref 3.0–12.0)
NEUTROS ABS: 6.1 10*3/uL (ref 1.4–7.7)
Neutrophils Relative %: 76 % (ref 43.0–77.0)
Platelets: 278 10*3/uL (ref 150.0–400.0)
RBC: 3.49 Mil/uL — ABNORMAL LOW (ref 4.22–5.81)
RDW: 15.1 % (ref 11.5–15.5)
WBC: 8.1 10*3/uL (ref 4.0–10.5)

## 2013-10-18 LAB — BASIC METABOLIC PANEL
BUN: 63 mg/dL — AB (ref 6–23)
CO2: 25 meq/L (ref 19–32)
CREATININE: 3.4 mg/dL — AB (ref 0.4–1.5)
Calcium: 8.8 mg/dL (ref 8.4–10.5)
Chloride: 107 mEq/L (ref 96–112)
GFR: 19.39 mL/min — ABNORMAL LOW (ref >60.00)
Glucose, Bld: 99 mg/dL (ref 70–99)
Potassium: 5.2 mEq/L — ABNORMAL HIGH (ref 3.5–5.1)
Sodium: 140 mEq/L (ref 135–145)

## 2013-10-18 LAB — LIPID PANEL
Cholesterol: 234 mg/dL — ABNORMAL HIGH (ref 0–200)
HDL: 25.3 mg/dL — AB (ref >39.00)
LDL Cholesterol: 171 mg/dL — ABNORMAL HIGH (ref 0–99)
NonHDL: 208.7
TRIGLYCERIDES: 191 mg/dL — AB (ref 0.0–149.0)
Total CHOL/HDL Ratio: 9
VLDL: 38.2 mg/dL (ref 0.0–40.0)

## 2013-10-18 LAB — HEPATIC FUNCTION PANEL
ALBUMIN: 3.9 g/dL (ref 3.5–5.2)
ALK PHOS: 74 U/L (ref 39–117)
ALT: 9 U/L (ref 0–53)
AST: 14 U/L (ref 0–37)
Bilirubin, Direct: 0 mg/dL (ref 0.0–0.3)
TOTAL PROTEIN: 7.5 g/dL (ref 6.0–8.3)
Total Bilirubin: 0.6 mg/dL (ref 0.2–1.2)

## 2013-10-18 LAB — FERRITIN: Ferritin: 372 ng/mL — ABNORMAL HIGH (ref 22–322)

## 2013-10-18 LAB — T4, FREE: Free T4: 0.74 ng/dL (ref 0.60–1.60)

## 2013-10-18 LAB — TSH: TSH: 23.8 u[IU]/mL — ABNORMAL HIGH (ref 0.35–4.50)

## 2013-10-18 NOTE — Telephone Encounter (Signed)
Message copied by Thomasena Edis on Thu Oct 18, 2013  1:46 PM ------      Message from: Kristian Covey      Created: Thu Oct 18, 2013  1:03 PM       Lipids are very high and TSH is up.      STOP methimazole.  Find out if he has been on any statins in the past.      Repeat TSH in 2 months. ------

## 2013-10-18 NOTE — Telephone Encounter (Signed)
Pt says that cannot remember being on any statins in the past.

## 2013-10-19 ENCOUNTER — Other Ambulatory Visit: Payer: Self-pay

## 2013-10-19 ENCOUNTER — Other Ambulatory Visit: Payer: Self-pay | Admitting: Family Medicine

## 2013-10-19 ENCOUNTER — Telehealth: Payer: Self-pay

## 2013-10-19 DIAGNOSIS — E785 Hyperlipidemia, unspecified: Secondary | ICD-10-CM

## 2013-10-19 MED ORDER — ATORVASTATIN CALCIUM 20 MG PO TABS: 20.0000 mg | ORAL_TABLET | Freq: Every day | ORAL | Status: DC

## 2013-10-19 NOTE — Telephone Encounter (Signed)
Left message for patient to return call.

## 2013-10-19 NOTE — Telephone Encounter (Signed)
Wrong patient

## 2013-10-19 NOTE — Telephone Encounter (Signed)
Start Lipitor 20 mg once daily and repeat lipids and hepatic in 6-8 weeks.

## 2013-10-19 NOTE — Telephone Encounter (Signed)
Pt needs a refill on Pradaxa 150 mg  [facility]  

## 2013-10-24 ENCOUNTER — Encounter (HOSPITAL_COMMUNITY)
Admission: RE | Admit: 2013-10-24 | Discharge: 2013-10-24 | Disposition: A | Discharge status: Home / Self Care | Payer: Medicare Other | Source: Ambulatory Visit | Attending: Nephrology | Admitting: Nephrology

## 2013-10-24 DIAGNOSIS — D649 Anemia, unspecified: Secondary | ICD-10-CM | POA: Diagnosis not present

## 2013-10-24 LAB — POCT HEMOGLOBIN-HEMACUE: HEMOGLOBIN: 10.5 g/dL — AB (ref 13.0–17.0)

## 2013-10-24 MED ORDER — EPOETIN ALFA 10000 UNIT/ML IJ SOLN
INTRAMUSCULAR | Status: AC
  Administered 2013-10-24: 3000 [IU] via SUBCUTANEOUS
  Filled 2013-10-24: qty 1

## 2013-10-24 MED ORDER — EPOETIN ALFA 3000 UNIT/ML IJ SOLN
3000.0000 [IU] | INTRAMUSCULAR | Status: DC
  Filled 2013-10-24: qty 1

## 2013-10-31 ENCOUNTER — Encounter (HOSPITAL_COMMUNITY)
Admission: RE | Admit: 2013-10-31 | Discharge: 2013-10-31 | Disposition: A | Discharge status: Home / Self Care | Payer: Medicare Other | Source: Ambulatory Visit | Attending: Nephrology | Admitting: Nephrology

## 2013-10-31 DIAGNOSIS — D649 Anemia, unspecified: Secondary | ICD-10-CM | POA: Diagnosis not present

## 2013-10-31 LAB — POCT HEMOGLOBIN-HEMACUE: Hemoglobin: 10.3 g/dL — ABNORMAL LOW (ref 13.0–17.0)

## 2013-10-31 MED ORDER — EPOETIN ALFA 10000 UNIT/ML IJ SOLN
INTRAMUSCULAR | Status: AC
  Administered 2013-10-31: 3000 [IU] via SUBCUTANEOUS
  Filled 2013-10-31: qty 1

## 2013-10-31 MED ORDER — EPOETIN ALFA 3000 UNIT/ML IJ SOLN
3000.0000 [IU] | INTRAMUSCULAR | Status: DC
  Filled 2013-10-31: qty 1

## 2013-11-07 ENCOUNTER — Encounter (HOSPITAL_COMMUNITY)
Admission: RE | Admit: 2013-11-07 | Discharge: 2013-11-07 | Disposition: A | Discharge status: Home / Self Care | Payer: Medicare Other | Source: Ambulatory Visit | Attending: Nephrology | Admitting: Nephrology

## 2013-11-07 DIAGNOSIS — D631 Anemia in chronic kidney disease: Secondary | ICD-10-CM | POA: Insufficient documentation

## 2013-11-07 DIAGNOSIS — N183 Chronic kidney disease, stage 3 (moderate): Secondary | ICD-10-CM | POA: Insufficient documentation

## 2013-11-07 LAB — POCT HEMOGLOBIN-HEMACUE: Hemoglobin: 10.2 g/dL — ABNORMAL LOW (ref 13.0–17.0)

## 2013-11-07 MED ORDER — EPOETIN ALFA 3000 UNIT/ML IJ SOLN
3000.0000 [IU] | INTRAMUSCULAR | Status: DC
  Filled 2013-11-07: qty 1

## 2013-11-07 MED ORDER — EPOETIN ALFA 10000 UNIT/ML IJ SOLN
INTRAMUSCULAR | Status: AC
  Administered 2013-11-07: 3000 [IU] via SUBCUTANEOUS
  Filled 2013-11-07: qty 1

## 2013-11-14 ENCOUNTER — Encounter (HOSPITAL_COMMUNITY)
Admission: RE | Admit: 2013-11-14 | Discharge: 2013-11-14 | Disposition: A | Discharge status: Home / Self Care | Payer: Medicare Other | Source: Ambulatory Visit | Attending: Nephrology | Admitting: Nephrology

## 2013-11-14 DIAGNOSIS — D631 Anemia in chronic kidney disease: Secondary | ICD-10-CM | POA: Diagnosis not present

## 2013-11-14 LAB — IRON AND TIBC
IRON: 33 ug/dL — AB (ref 42–135)
SATURATION RATIOS: 14 % — AB (ref 20–55)
TIBC: 236 ug/dL (ref 215–435)
UIBC: 203 ug/dL (ref 125–400)

## 2013-11-14 LAB — POCT HEMOGLOBIN-HEMACUE: Hemoglobin: 10 g/dL — ABNORMAL LOW (ref 13.0–17.0)

## 2013-11-14 LAB — FERRITIN: FERRITIN: 330 ng/mL — AB (ref 22–322)

## 2013-11-14 MED ORDER — EPOETIN ALFA 3000 UNIT/ML IJ SOLN
3000.0000 [IU] | INTRAMUSCULAR | Status: DC
  Filled 2013-11-14: qty 1

## 2013-11-14 MED ORDER — EPOETIN ALFA 10000 UNIT/ML IJ SOLN
INTRAMUSCULAR | Status: AC
  Administered 2013-11-14: 10000 [IU] via SUBCUTANEOUS
  Filled 2013-11-14: qty 1

## 2013-11-16 ENCOUNTER — Other Ambulatory Visit: Payer: Self-pay

## 2013-11-21 ENCOUNTER — Encounter (HOSPITAL_COMMUNITY)
Admission: RE | Admit: 2013-11-21 | Discharge: 2013-11-21 | Disposition: A | Discharge status: Home / Self Care | Payer: Medicare Other | Source: Ambulatory Visit | Attending: Nephrology | Admitting: Nephrology

## 2013-11-21 DIAGNOSIS — D631 Anemia in chronic kidney disease: Secondary | ICD-10-CM | POA: Diagnosis not present

## 2013-11-21 LAB — POCT HEMOGLOBIN-HEMACUE: Hemoglobin: 10.9 g/dL — ABNORMAL LOW (ref 13.0–17.0)

## 2013-11-21 MED ORDER — EPOETIN ALFA 10000 UNIT/ML IJ SOLN
INTRAMUSCULAR | Status: AC
  Filled 2013-11-21: qty 1

## 2013-11-21 MED ORDER — EPOETIN ALFA 3000 UNIT/ML IJ SOLN
3000.0000 [IU] | INTRAMUSCULAR | Status: DC
  Administered 2013-11-21: 3000 [IU] via SUBCUTANEOUS
  Filled 2013-11-21: qty 1

## 2013-11-28 ENCOUNTER — Other Ambulatory Visit (HOSPITAL_COMMUNITY): Payer: Self-pay | Admitting: *Deleted

## 2013-11-28 ENCOUNTER — Encounter (HOSPITAL_COMMUNITY)
Admission: RE | Admit: 2013-11-28 | Discharge: 2013-11-28 | Disposition: A | Discharge status: Home / Self Care | Payer: Medicare Other | Source: Ambulatory Visit | Attending: Nephrology | Admitting: Nephrology

## 2013-11-28 DIAGNOSIS — I6523 Occlusion and stenosis of bilateral carotid arteries: Secondary | ICD-10-CM

## 2013-11-28 DIAGNOSIS — D631 Anemia in chronic kidney disease: Secondary | ICD-10-CM | POA: Diagnosis not present

## 2013-11-28 MED ORDER — EPOETIN ALFA 10000 UNIT/ML IJ SOLN
INTRAMUSCULAR | Status: AC
  Administered 2013-11-28: 3000 [IU] via SUBCUTANEOUS
  Filled 2013-11-28: qty 1

## 2013-11-28 MED ORDER — EPOETIN ALFA 3000 UNIT/ML IJ SOLN: 3000.0000 [IU] | INTRAMUSCULAR | Status: DC

## 2013-11-29 LAB — POCT HEMOGLOBIN-HEMACUE: Hemoglobin: 10.6 g/dL — ABNORMAL LOW (ref 13.0–17.0)

## 2013-12-01 ENCOUNTER — Other Ambulatory Visit: Payer: Self-pay | Admitting: Cardiovascular Disease

## 2013-12-05 ENCOUNTER — Encounter (HOSPITAL_COMMUNITY)
Admission: RE | Admit: 2013-12-05 | Discharge: 2013-12-05 | Disposition: A | Discharge status: Home / Self Care | Payer: Medicare Other | Source: Ambulatory Visit | Attending: Nephrology | Admitting: Nephrology

## 2013-12-05 DIAGNOSIS — D631 Anemia in chronic kidney disease: Secondary | ICD-10-CM | POA: Insufficient documentation

## 2013-12-05 DIAGNOSIS — N183 Chronic kidney disease, stage 3 (moderate): Secondary | ICD-10-CM | POA: Diagnosis not present

## 2013-12-05 LAB — POCT HEMOGLOBIN-HEMACUE: Hemoglobin: 10 g/dL — ABNORMAL LOW (ref 13.0–17.0)

## 2013-12-05 MED ORDER — EPOETIN ALFA 10000 UNIT/ML IJ SOLN
INTRAMUSCULAR | Status: AC
  Administered 2013-12-05: 10000 [IU]
  Filled 2013-12-05: qty 1

## 2013-12-05 MED ORDER — EPOETIN ALFA 3000 UNIT/ML IJ SOLN
3000.0000 [IU] | INTRAMUSCULAR | Status: DC
  Filled 2013-12-05: qty 1

## 2013-12-12 ENCOUNTER — Encounter (HOSPITAL_COMMUNITY)
Admission: RE | Admit: 2013-12-12 | Discharge: 2013-12-12 | Disposition: A | Discharge status: Home / Self Care | Payer: Medicare Other | Source: Ambulatory Visit | Attending: Nephrology | Admitting: Nephrology

## 2013-12-12 DIAGNOSIS — D631 Anemia in chronic kidney disease: Secondary | ICD-10-CM | POA: Diagnosis not present

## 2013-12-12 LAB — IRON AND TIBC
IRON: 33 ug/dL — AB (ref 42–135)
Saturation Ratios: 15 % — ABNORMAL LOW (ref 20–55)
TIBC: 218 ug/dL (ref 215–435)
UIBC: 185 ug/dL (ref 125–400)

## 2013-12-12 LAB — FERRITIN: FERRITIN: 380 ng/mL — AB (ref 22–322)

## 2013-12-12 LAB — POCT HEMOGLOBIN-HEMACUE: Hemoglobin: 10 g/dL — ABNORMAL LOW (ref 13.0–17.0)

## 2013-12-12 MED ORDER — EPOETIN ALFA 10000 UNIT/ML IJ SOLN
INTRAMUSCULAR | Status: AC
  Administered 2013-12-12: 3000 [IU] via SUBCUTANEOUS
  Filled 2013-12-12: qty 1

## 2013-12-12 MED ORDER — EPOETIN ALFA 3000 UNIT/ML IJ SOLN: 3000.0000 [IU] | INTRAMUSCULAR | Status: DC

## 2013-12-14 ENCOUNTER — Telehealth: Payer: Self-pay

## 2013-12-14 NOTE — Telephone Encounter (Signed)
Pt needs AWV 

## 2013-12-18 ENCOUNTER — Ambulatory Visit (INDEPENDENT_AMBULATORY_CARE_PROVIDER_SITE_OTHER): Payer: Medicare Other | Admitting: Cardiovascular Disease

## 2013-12-18 ENCOUNTER — Ambulatory Visit (HOSPITAL_COMMUNITY): Payer: Medicare Other | Attending: Family Medicine | Admitting: *Deleted

## 2013-12-18 ENCOUNTER — Encounter (HOSPITAL_COMMUNITY): Payer: Medicare Other

## 2013-12-18 ENCOUNTER — Encounter: Payer: Self-pay | Admitting: Cardiovascular Disease

## 2013-12-18 VITALS — BP 130/62 | HR 79 | Ht 68.0 in | Wt 195.8 lb

## 2013-12-18 DIAGNOSIS — I10 Essential (primary) hypertension: Secondary | ICD-10-CM | POA: Diagnosis not present

## 2013-12-18 DIAGNOSIS — Z87891 Personal history of nicotine dependence: Secondary | ICD-10-CM | POA: Insufficient documentation

## 2013-12-18 DIAGNOSIS — I2510 Atherosclerotic heart disease of native coronary artery without angina pectoris: Secondary | ICD-10-CM | POA: Insufficient documentation

## 2013-12-18 DIAGNOSIS — Z951 Presence of aortocoronary bypass graft: Secondary | ICD-10-CM | POA: Insufficient documentation

## 2013-12-18 DIAGNOSIS — E119 Type 2 diabetes mellitus without complications: Secondary | ICD-10-CM | POA: Insufficient documentation

## 2013-12-18 DIAGNOSIS — I6523 Occlusion and stenosis of bilateral carotid arteries: Secondary | ICD-10-CM

## 2013-12-18 DIAGNOSIS — E785 Hyperlipidemia, unspecified: Secondary | ICD-10-CM | POA: Diagnosis not present

## 2013-12-18 DIAGNOSIS — I739 Peripheral vascular disease, unspecified: Secondary | ICD-10-CM

## 2013-12-18 NOTE — Assessment & Plan Note (Signed)
He has stable 60-79% bilateral carotid stenosis. No previous CVA. Repeat carotid Doppler in 6 months.

## 2013-12-18 NOTE — Progress Notes (Signed)
Carotid Duplex Performed 

## 2013-12-18 NOTE — Progress Notes (Signed)
Primary cardiologist: Dr. Elease HashimotoNahser  HPI  This is a pleasant 68 year old man who is here today for a followup visit regarding peripheral arterial disease. The patient has known history of coronary artery disease status post anterior MI in 2013. He is status post CABG. He had severe LV systolic dysfunction at that time with gradual improvement. Most recent ejection fraction was 35-40%. He has multiple chronic medical conditions including type 2 diabetes, hypertension, hyperlipidemia and chronic kidney disease. Most recent creatinine was 3. He suffers from chronic diarrhea.  He was evaluated in June, 2014  for severe claudication involving left calf discomfort with rest pain. His ABI was severely reduced on the left and moderately reduced on the right. I performed lower extremity angiography which showed: left : 90% focal stenosis in external iliac artery, long occlusion of SFA with 3 vessel run off . s/p slef expanding stent placement to external iliac artery. Right : borderline significant disease in comon and external iliac arteries. Diffuse 70-90% in SFA with 3 vessel run off.  He has been doing reasonably well. He continues to have issues with diarrhea. He walks with a cane and complains of right leg discomfort starting from the thigh area with short distance of walking. The discomfort is much less on the left side.  Allergies  Allergen Reactions  . Diltiazem Hcl Hives and Swelling  . Nifedipine Hives and Swelling     Current Outpatient Prescriptions on File Prior to Visit  Medication Sig Dispense Refill  . aspirin 81 MG EC tablet Take 1 tablet (81 mg total) by mouth daily.    [name]. atorvastatin (LIPITOR) 20 MG tablet Take 1 tablet (20 mg total) by mouth daily. 30 tablet 5  . calcitRIOL (ROCALTROL) 0.25 MCG capsule Take 0.25 mcg by mouth daily. PATIENT ONLY TAKES 3 DAYS A WEEK    . carvedilol (COREG) 25 MG tablet TAKE 1 TABLET BY MOUTH TWICE A DAY WITH A MEAL 60 tablet 2  . clopidogrel (PLAVIX)  75 MG tablet TAKE 1 TABLET EVERY DAY 30 tablet 6  . diphenoxylate-atropine (LOMOTIL) 2.5-0.025 MG per tablet TAKE 1 TABLET 4 TIMES A DAY AS NEEDED FOR DIARRHEA OR LOOSE STOOLS 90 tablet 0  . epoetin alfa (EPOGEN,PROCRIT) 3000 UNIT/ML injection Inject 3,000 Units into the skin once.    . fluticasone (FLONASE) 50 MCG/ACT nasal spray Place 2 sprays into the nose daily.    . furosemide (LASIX) 40 MG tablet Take one tablet daily and may take one extra as needed 110 tablet 3  . glucose blood (ONETOUCH VERIO) test strip Use as instructed. DX: 250.00 100 each 3  . HYDROcodone-acetaminophen (NORCO/VICODIN) 5-325 MG per tablet     . isosorbide-hydrALAZINE (BIDIL) 20-37.5 MG per tablet Take one tablet three times a day 90 tablet 6  . nitroGLYCERIN (NITROSTAT) 0.4 MG SL tablet Place 0.4 mg under the tongue every 5 (five) minutes as needed for chest pain.    Letta Pate. ONETOUCH DELICA LANCETS FINE MISC Use as instructed. DX: 250.00 100 each 3  . oxyCODONE (OXY IR/ROXICODONE) 5 MG immediate release tablet Take 1 tablet (5 mg total) by mouth as needed. Per Dr. Georgann HousekeeperNaser 40 tablet 0  . paregoric 2 MG/5ML solution Take 5 mLs by mouth 4 (four) times daily as needed for diarrhea or loose stools. 120 mL 2  . sodium bicarbonate 325 MG tablet Take 650 mg by mouth 2 (two) times daily.      No current facility-administered medications on file prior to visit.  Past Medical History  Diagnosis Date  . Diabetes mellitus   . Hypertension   . Chronic systolic heart failure     echo 04/12/11: Mild LVH, EF 30-35%, mid to distal anteroseptal and apical hypokinesis, grade 2 diastolic dysfunction, moderate LAE, mild RAE.  [name] Hyperlipidemia   . Tobacco abuse   . CAD (coronary artery disease)     acute anterior STEMI, late presentation 04/06/11 LHC 3/5 demonstrated severe ostial left main 95% stenosis and otherwise three-vessel CAD with an ejection fraction of 15%.  Emergent CABG: Dr. Dorris Fetch - Grafts: LIMA-LAD, SVG-D1, SVG-OM1,  SVG-PDA and PL.  . Ischemic cardiomyopathy   . DM2 (diabetes mellitus, type 2)   . 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 Acute renal insufficiency     05/2011 admission (cr 1.54 at discharge)  . Anemia     Post-CABG  . Arthritis   . Stroke   . Sleep apnea   . Neuromuscular disorder     diabetic neuropathy  . Diarrhea     has had c diff diarrhea  . Weakness   . Bruises easily   . PAD (peripheral artery disease) 07/28/2012    Angiography 08/2012: Right: 90% focal stenosis in external iliac artery, long occlusion of SFA with 3 vessel run off . s/p slef expanding stent placement to external iliac artery. Left: borderline significant disease in comon and external iliac arteries. Diffuse 70-90% in SFA with 3 vessel run off.      Past Surgical History  Procedure Laterality Date  . Coronary artery bypass graft  04/06/2011    Procedure: CORONARY ARTERY BYPASS GRAFTING (CABG);  Surgeon: Loreli Slot, MD;  Location: [facility] OR;  Service: Open Heart Surgery;  Laterality: N/A;  Coronary Artery Bypass Graft times four utilizing the left internal mammary artery and the Right and left  greater Saphenous veins Harvested endoscopically.  . Fibroid bypass 04/06/11    . Colon surgery      rectal fissure  . Cervical disc surgery  1997 - approximate  . Colonoscopy  07/29/2011    Procedure: COLONOSCOPY;  Surgeon: Rachael Fee, MD;  Location: [facility];  Service: Endoscopy;  Laterality: N/A;  . Eye surgery  2011    cataract removal     Family History  Problem Relation Age of Onset  . Heart attack Mother     ?16s  . Diabetes Mother   . Heart attack Sister     53s  . Diabetes Sister     Dietitian  . Congestive Heart Failure Father      History   Social History  . Marital Status: Single    Spouse Name: N/A    Number of Children: 0  . Years of Education: N/A   Occupational History  . Not on file.   Social History Main Topics  . Smoking status: Former Smoker -- 0.50  packs/day    Types: Cigarettes    Quit date: 04/06/2011  . Smokeless tobacco: Never Used  . Alcohol Use: No  . Drug Use: No  . Sexual Activity: No   Other Topics Concern  . Not on file   Social History Narrative       PHYSICAL EXAM   BP 130/62 mmHg  Pulse 79  Ht 5\' 8"  (1.727 m)  Wt 195 lb 12.8 oz (88.814 kg)  BMI 29.78 kg/m2 Constitutional: He is oriented to person, place, and time. He appears well-developed and well-nourished. No distress.  HENT: No nasal  discharge.  Head: Normocephalic and atraumatic.  Eyes: Pupils are equal and round. Right eye exhibits no discharge. Left eye exhibits no discharge.  Neck: Normal range of motion. Neck supple. No JVD present. No thyromegaly present.  Cardiovascular: Normal rate, regular rhythm, normal heart sounds and. Exam reveals no gallop and no friction rub. No murmur heard.  Pulmonary/Chest: Effort normal and breath sounds normal. No stridor. No respiratory distress. He has no wheezes. He has no rales. He exhibits no tenderness.  Abdominal: Soft. Bowel sounds are normal. He exhibits no distension. There is no tenderness. There is no rebound and no guarding.  Musculoskeletal: Normal range of motion. He exhibits mild edema worse on the left side and no tenderness.  Neurological: He is alert and oriented to person, place, and time. Coordination normal.  Skin: Skin is warm and dry. Redness in the left leg. He is not diaphoretic. No erythema. No pallor.  Psychiatric: He has a normal mood and affect. His behavior is normal. Judgment and thought content normal.  Vascular:  Femoral pulse: Diminished on both sides.  Distal pulses are not palpable.   EKG: Normal sinus rhythm with first-degreeAV block, left ventricular hypertrophy. T wave changes suggestive of lateral ischemia.   ASSESSMENT AND PLAN

## 2013-12-18 NOTE — Assessment & Plan Note (Signed)
He reports no chest pain. 

## 2013-12-18 NOTE — Patient Instructions (Signed)
Your physician has requested that you have an ankle brachial index (ABI) in January 2016. During this test an ultrasound and blood pressure cuff are used to evaluate the arteries that supply the arms and legs with blood. Allow thirty minutes for this exam. There are no restrictions or special instructions.  Your physician has requested an aorto-iliac duplex in January 2016.  Your physician recommends that you continue on your current medications as directed. Please refer to the Current Medication list given to you today.  Your physician wants you to follow-up in: 6 MONTHS with Dr Kirke [name].  You will receive a reminder letter in the mail two months in advance. If you don't receive a letter, please call our office to schedule the follow-up appointment.

## 2013-12-18 NOTE — Assessment & Plan Note (Signed)
Blood pressure is reasonably controlled on current medications. 

## 2013-12-18 NOTE — Assessment & Plan Note (Signed)
The patient's right leg claudication is due to right common and external iliac disease. Angiography is limited by advanced chronic kidney disease. I recommend continuing medical therapy for now and repeat aortoiliac duplex in January.

## 2013-12-19 ENCOUNTER — Encounter (HOSPITAL_COMMUNITY)
Admission: RE | Admit: 2013-12-19 | Discharge: 2013-12-19 | Disposition: A | Discharge status: Home / Self Care | Payer: Medicare Other | Source: Ambulatory Visit | Attending: Nephrology | Admitting: Nephrology

## 2013-12-19 DIAGNOSIS — D631 Anemia in chronic kidney disease: Secondary | ICD-10-CM | POA: Diagnosis not present

## 2013-12-19 LAB — POCT HEMOGLOBIN-HEMACUE: HEMOGLOBIN: 9.6 g/dL — AB (ref 13.0–17.0)

## 2013-12-19 MED ORDER — EPOETIN ALFA 3000 UNIT/ML IJ SOLN
3000.0000 [IU] | INTRAMUSCULAR | Status: DC
  Administered 2013-12-19: 3000 [IU] via SUBCUTANEOUS
  Filled 2013-12-19: qty 1

## 2013-12-19 MED ORDER — EPOETIN ALFA 10000 UNIT/ML IJ SOLN
INTRAMUSCULAR | Status: AC
  Filled 2013-12-19: qty 1

## 2013-12-20 MED FILL — Epoetin Alfa Inj 10000 Unit/ML: INTRAMUSCULAR | Qty: 1 | Status: AC

## 2013-12-24 ENCOUNTER — Other Ambulatory Visit (HOSPITAL_COMMUNITY): Payer: Self-pay | Admitting: *Deleted

## 2013-12-25 ENCOUNTER — Encounter (HOSPITAL_COMMUNITY)
Admission: RE | Admit: 2013-12-25 | Discharge: 2013-12-25 | Disposition: A | Discharge status: Home / Self Care | Payer: Medicare Other | Source: Ambulatory Visit | Attending: Nephrology | Admitting: Nephrology

## 2013-12-25 ENCOUNTER — Telehealth: Payer: Self-pay

## 2013-12-25 DIAGNOSIS — D631 Anemia in chronic kidney disease: Secondary | ICD-10-CM | POA: Diagnosis not present

## 2013-12-25 LAB — POCT HEMOGLOBIN-HEMACUE: Hemoglobin: 10 g/dL — ABNORMAL LOW (ref 13.0–17.0)

## 2013-12-25 MED ORDER — FERUMOXYTOL INJECTION 510 MG/17 ML
510.0000 mg | Freq: Once | INTRAVENOUS | Status: AC
  Administered 2013-12-25: 510 mg via INTRAVENOUS
  Filled 2013-12-25: qty 17

## 2013-12-25 MED ORDER — EPOETIN ALFA 10000 UNIT/ML IJ SOLN
INTRAMUSCULAR | Status: AC
  Administered 2013-12-25: 10000 [IU] via SUBCUTANEOUS
  Filled 2013-12-25: qty 1

## 2013-12-25 MED ORDER — EPOETIN ALFA 3000 UNIT/ML IJ SOLN
3000.0000 [IU] | INTRAMUSCULAR | Status: DC
  Filled 2013-12-25: qty 1

## 2013-12-25 NOTE — Telephone Encounter (Signed)
Rx Pareqoric Liquid is no longer available. Is there anything else you would like to prescribe for patient.   [facility] [address]

## 2013-12-26 NOTE — Telephone Encounter (Signed)
i dont know of anything equivalent.

## 2014-01-01 ENCOUNTER — Encounter (HOSPITAL_COMMUNITY)
Admission: RE | Admit: 2014-01-01 | Discharge: 2014-01-01 | Disposition: A | Discharge status: Home / Self Care | Payer: Medicare Other | Source: Ambulatory Visit | Attending: Nephrology | Admitting: Nephrology

## 2014-01-01 DIAGNOSIS — D631 Anemia in chronic kidney disease: Secondary | ICD-10-CM | POA: Diagnosis not present

## 2014-01-01 DIAGNOSIS — N183 Chronic kidney disease, stage 3 (moderate): Secondary | ICD-10-CM | POA: Diagnosis not present

## 2014-01-01 LAB — POCT HEMOGLOBIN-HEMACUE: Hemoglobin: 10.5 g/dL — ABNORMAL LOW (ref 13.0–17.0)

## 2014-01-01 MED ORDER — EPOETIN ALFA 3000 UNIT/ML IJ SOLN: 3000.0000 [IU] | INTRAMUSCULAR | Status: DC

## 2014-01-01 MED ORDER — EPOETIN ALFA 10000 UNIT/ML IJ SOLN
INTRAMUSCULAR | Status: AC
  Administered 2014-01-01: 10000 [IU] via SUBCUTANEOUS
  Filled 2014-01-01: qty 1

## 2014-01-01 MED ORDER — SODIUM CHLORIDE 0.9 % IV SOLN
510.0000 mg | Freq: Once | INTRAVENOUS | Status: AC
  Administered 2014-01-01: 510 mg via INTRAVENOUS
  Filled 2014-01-01: qty 17

## 2014-01-09 ENCOUNTER — Encounter (HOSPITAL_COMMUNITY)
Admission: RE | Admit: 2014-01-09 | Discharge: 2014-01-09 | Disposition: A | Discharge status: Home / Self Care | Payer: Medicare Other | Source: Ambulatory Visit | Attending: Nephrology | Admitting: Nephrology

## 2014-01-09 DIAGNOSIS — D631 Anemia in chronic kidney disease: Secondary | ICD-10-CM | POA: Diagnosis not present

## 2014-01-09 LAB — FERRITIN: Ferritin: 967 ng/mL — ABNORMAL HIGH (ref 22–322)

## 2014-01-09 LAB — POCT HEMOGLOBIN-HEMACUE: HEMOGLOBIN: 10.7 g/dL — AB (ref 13.0–17.0)

## 2014-01-09 LAB — IRON AND TIBC
Iron: 45 ug/dL (ref 42–135)
Saturation Ratios: 21 % (ref 20–55)
TIBC: 218 ug/dL (ref 215–435)
UIBC: 173 ug/dL (ref 125–400)

## 2014-01-09 MED ORDER — EPOETIN ALFA 10000 UNIT/ML IJ SOLN
INTRAMUSCULAR | Status: AC
  Administered 2014-01-09: 10000 [IU] via SUBCUTANEOUS
  Filled 2014-01-09: qty 1

## 2014-01-09 MED ORDER — EPOETIN ALFA 3000 UNIT/ML IJ SOLN
3000.0000 [IU] | INTRAMUSCULAR | Status: DC
  Filled 2014-01-09: qty 1

## 2014-01-09 MED ORDER — EPOETIN ALFA 10000 UNIT/ML IJ SOLN
INTRAMUSCULAR | Status: AC
  Filled 2014-01-09: qty 1

## 2014-01-10 ENCOUNTER — Encounter (HOSPITAL_COMMUNITY): Payer: Self-pay | Admitting: Cardiovascular Disease

## 2014-01-16 ENCOUNTER — Encounter (HOSPITAL_COMMUNITY)
Admission: RE | Admit: 2014-01-16 | Discharge: 2014-01-16 | Disposition: A | Discharge status: Home / Self Care | Payer: Medicare Other | Source: Ambulatory Visit | Attending: Nephrology | Admitting: Nephrology

## 2014-01-16 DIAGNOSIS — D631 Anemia in chronic kidney disease: Secondary | ICD-10-CM | POA: Insufficient documentation

## 2014-01-16 DIAGNOSIS — N183 Chronic kidney disease, stage 3 (moderate): Secondary | ICD-10-CM | POA: Diagnosis not present

## 2014-01-16 LAB — POCT HEMOGLOBIN-HEMACUE: Hemoglobin: 11.1 g/dL — ABNORMAL LOW (ref 13.0–17.0)

## 2014-01-16 MED ORDER — EPOETIN ALFA 10000 UNIT/ML IJ SOLN
INTRAMUSCULAR | Status: AC
  Administered 2014-01-16: 3000 [IU] via SUBCUTANEOUS
  Filled 2014-01-16: qty 1

## 2014-01-16 MED ORDER — EPOETIN ALFA 3000 UNIT/ML IJ SOLN
3000.0000 [IU] | INTRAMUSCULAR | Status: DC
  Filled 2014-01-16: qty 1

## 2014-01-23 ENCOUNTER — Telehealth: Payer: Self-pay | Admitting: Family Medicine

## 2014-01-23 ENCOUNTER — Encounter (HOSPITAL_COMMUNITY)
Admission: RE | Admit: 2014-01-23 | Discharge: 2014-01-23 | Disposition: A | Discharge status: Home / Self Care | Payer: Medicare Other | Source: Ambulatory Visit | Attending: Nephrology | Admitting: Nephrology

## 2014-01-23 DIAGNOSIS — D631 Anemia in chronic kidney disease: Secondary | ICD-10-CM | POA: Diagnosis not present

## 2014-01-23 LAB — POCT HEMOGLOBIN-HEMACUE: HEMOGLOBIN: 10.6 g/dL — AB (ref 13.0–17.0)

## 2014-01-23 MED ORDER — EPOETIN ALFA 3000 UNIT/ML IJ SOLN
3000.0000 [IU] | INTRAMUSCULAR | Status: DC
  Filled 2014-01-23: qty 1

## 2014-01-23 MED ORDER — DIPHENOXYLATE-ATROPINE 2.5-0.025 MG PO TABS: ORAL_TABLET | ORAL | Status: DC

## 2014-01-23 MED ORDER — EPOETIN ALFA 10000 UNIT/ML IJ SOLN
INTRAMUSCULAR | Status: AC
  Administered 2014-01-23: 3000 [IU] via SUBCUTANEOUS
  Filled 2014-01-23: qty 1

## 2014-01-23 NOTE — Telephone Encounter (Signed)
We havent denied it.  They had requested Paregoric in November and we were informed it is no longer available and we stated we were not aware of any equivalent.

## 2014-01-23 NOTE — Telephone Encounter (Signed)
Last visit 10/17/13 Last refill 10/01/13 #90 0 refill

## 2014-01-23 NOTE — Telephone Encounter (Signed)
Pt aware that Rx is faxed to pharmacy

## 2014-01-23 NOTE — Telephone Encounter (Signed)
Pt request refill diphenoxylate-atropine (LOMOTIL) 2.5-0.025 MG per tablet Son states pt has chronic diarrhea.  Pt was wanting to go off visiting for christmas and cannot go w/ouot this med. [facility] [address]    Son would like to know why this was denied. Pt really needs.

## 2014-01-30 ENCOUNTER — Encounter (HOSPITAL_COMMUNITY)
Admission: RE | Admit: 2014-01-30 | Discharge: 2014-01-30 | Disposition: A | Discharge status: Home / Self Care | Payer: Medicare Other | Source: Ambulatory Visit | Attending: Nephrology | Admitting: Nephrology

## 2014-01-30 DIAGNOSIS — D631 Anemia in chronic kidney disease: Secondary | ICD-10-CM | POA: Diagnosis not present

## 2014-01-30 LAB — HM DIABETES EYE EXAM

## 2014-01-30 LAB — POCT HEMOGLOBIN-HEMACUE: Hemoglobin: 10.5 g/dL — ABNORMAL LOW (ref 13.0–17.0)

## 2014-01-30 MED ORDER — EPOETIN ALFA 10000 UNIT/ML IJ SOLN
INTRAMUSCULAR | Status: AC
  Filled 2014-01-30: qty 1

## 2014-01-30 MED ORDER — EPOETIN ALFA 10000 UNIT/ML IJ SOLN
INTRAMUSCULAR | Status: AC
  Administered 2014-01-30: 3000 [IU]
  Filled 2014-01-30: qty 1

## 2014-01-30 MED ORDER — EPOETIN ALFA 3000 UNIT/ML IJ SOLN
3000.0000 [IU] | INTRAMUSCULAR | Status: DC
Start: 2014-01-30 — End: 2014-01-31
  Filled 2014-01-30: qty 1

## 2014-01-30 MED ORDER — EPOETIN ALFA 20000 UNIT/ML IJ SOLN
INTRAMUSCULAR | Status: AC
  Filled 2014-01-30: qty 1

## 2014-02-02 ENCOUNTER — Other Ambulatory Visit: Payer: Self-pay | Admitting: Family Medicine

## 2014-02-04 NOTE — Telephone Encounter (Signed)
Is it okay to refill. It is not on patient current list.

## 2014-02-04 NOTE — Telephone Encounter (Signed)
Yes- if this is still available.

## 2014-02-05 ENCOUNTER — Ambulatory Visit (HOSPITAL_BASED_OUTPATIENT_CLINIC_OR_DEPARTMENT_OTHER): Payer: Medicare Other | Admitting: Cardiology

## 2014-02-05 ENCOUNTER — Ambulatory Visit (HOSPITAL_COMMUNITY): Payer: Medicare Other | Attending: Cardiovascular Disease | Admitting: Cardiology

## 2014-02-05 ENCOUNTER — Encounter (HOSPITAL_COMMUNITY)
Admission: RE | Admit: 2014-02-05 | Discharge: 2014-02-05 | Disposition: A | Discharge status: Home / Self Care | Payer: Medicare Other | Source: Ambulatory Visit | Attending: Nephrology | Admitting: Nephrology

## 2014-02-05 DIAGNOSIS — I739 Peripheral vascular disease, unspecified: Secondary | ICD-10-CM | POA: Diagnosis not present

## 2014-02-05 DIAGNOSIS — Z951 Presence of aortocoronary bypass graft: Secondary | ICD-10-CM | POA: Insufficient documentation

## 2014-02-05 DIAGNOSIS — N183 Chronic kidney disease, stage 3 (moderate): Secondary | ICD-10-CM | POA: Insufficient documentation

## 2014-02-05 DIAGNOSIS — I10 Essential (primary) hypertension: Secondary | ICD-10-CM | POA: Diagnosis not present

## 2014-02-05 DIAGNOSIS — Z87891 Personal history of nicotine dependence: Secondary | ICD-10-CM | POA: Insufficient documentation

## 2014-02-05 DIAGNOSIS — E785 Hyperlipidemia, unspecified: Secondary | ICD-10-CM | POA: Insufficient documentation

## 2014-02-05 DIAGNOSIS — I2510 Atherosclerotic heart disease of native coronary artery without angina pectoris: Secondary | ICD-10-CM | POA: Diagnosis not present

## 2014-02-05 DIAGNOSIS — D631 Anemia in chronic kidney disease: Secondary | ICD-10-CM | POA: Diagnosis not present

## 2014-02-05 DIAGNOSIS — I700 Atherosclerosis of aorta: Secondary | ICD-10-CM

## 2014-02-05 DIAGNOSIS — E119 Type 2 diabetes mellitus without complications: Secondary | ICD-10-CM | POA: Insufficient documentation

## 2014-02-05 LAB — IRON AND TIBC
Iron: 58 ug/dL (ref 42–165)
Saturation Ratios: 24 % (ref 20–55)
TIBC: 242 ug/dL (ref 215–435)
UIBC: 184 ug/dL (ref 125–400)

## 2014-02-05 LAB — POCT HEMOGLOBIN-HEMACUE: Hemoglobin: 12 g/dL — ABNORMAL LOW (ref 13.0–17.0)

## 2014-02-05 LAB — FERRITIN: Ferritin: 634 ng/mL — ABNORMAL HIGH (ref 22–322)

## 2014-02-05 MED ORDER — EPOETIN ALFA 3000 UNIT/ML IJ SOLN
3000.0000 [IU] | INTRAMUSCULAR | Status: DC
  Filled 2014-02-05: qty 1

## 2014-02-05 NOTE — Progress Notes (Signed)
Abdominal Aorta Duplex performed  

## 2014-02-05 NOTE — Progress Notes (Signed)
ABI performed  

## 2014-02-07 ENCOUNTER — Telehealth: Payer: Self-pay | Admitting: *Deleted

## 2014-02-07 MED ORDER — HYDRALAZINE HCL 25 MG PO TABS: 25.0000 mg | ORAL_TABLET | Freq: Three times a day (TID) | ORAL | Status: AC

## 2014-02-07 MED ORDER — ISOSORBIDE DINITRATE 20 MG PO TABS: 20.0000 mg | ORAL_TABLET | Freq: Three times a day (TID) | ORAL | Status: AC

## 2014-02-07 NOTE — Telephone Encounter (Signed)
A representative from [facility] called and stated that the bidil is unavailable at this time and wanted to see if the patient could be switched to something else. Please advise. Thanks, MI

## 2014-02-07 NOTE — Telephone Encounter (Signed)
[facility] for Isordil Dinitrate 20 mg TID and Hydralazine 25 mg TID

## 2014-02-07 NOTE — Telephone Encounter (Signed)
Agree with hydralazine and Isordil instead of biDil

## 2014-02-18 ENCOUNTER — Other Ambulatory Visit: Payer: Self-pay | Admitting: Family Medicine

## 2014-02-18 ENCOUNTER — Other Ambulatory Visit: Payer: Self-pay | Admitting: Cardiovascular Disease

## 2014-02-20 ENCOUNTER — Encounter (HOSPITAL_COMMUNITY)
Admission: RE | Admit: 2014-02-20 | Discharge: 2014-02-20 | Disposition: A | Discharge status: Home / Self Care | Payer: Medicare Other | Source: Ambulatory Visit | Attending: Nephrology | Admitting: Nephrology

## 2014-02-20 DIAGNOSIS — D631 Anemia in chronic kidney disease: Secondary | ICD-10-CM | POA: Diagnosis not present

## 2014-02-20 MED ORDER — EPOETIN ALFA 10000 UNIT/ML IJ SOLN
INTRAMUSCULAR | Status: AC
  Administered 2014-02-20: 10000 [IU] via SUBCUTANEOUS
  Filled 2014-02-20: qty 1

## 2014-02-20 MED ORDER — EPOETIN ALFA 3000 UNIT/ML IJ SOLN
3000.0000 [IU] | INTRAMUSCULAR | Status: DC
  Filled 2014-02-20: qty 1

## 2014-02-21 LAB — POCT HEMOGLOBIN-HEMACUE: Hemoglobin: 11 g/dL — ABNORMAL LOW (ref 13.0–17.0)

## 2014-02-26 ENCOUNTER — Encounter: Payer: Self-pay | Admitting: Family Medicine

## 2014-02-27 ENCOUNTER — Other Ambulatory Visit: Payer: Self-pay | Admitting: Cardiovascular Disease

## 2014-02-27 ENCOUNTER — Encounter (HOSPITAL_COMMUNITY)
Admission: RE | Admit: 2014-02-27 | Discharge: 2014-02-27 | Disposition: A | Discharge status: Home / Self Care | Payer: Medicare Other | Source: Ambulatory Visit | Attending: Nephrology | Admitting: Nephrology

## 2014-02-27 DIAGNOSIS — D631 Anemia in chronic kidney disease: Secondary | ICD-10-CM | POA: Diagnosis not present

## 2014-02-27 LAB — POCT HEMOGLOBIN-HEMACUE: Hemoglobin: 11.4 g/dL — ABNORMAL LOW (ref 13.0–17.0)

## 2014-02-27 MED ORDER — EPOETIN ALFA 3000 UNIT/ML IJ SOLN
3000.0000 [IU] | INTRAMUSCULAR | Status: DC
  Administered 2014-02-27: 3000 [IU] via SUBCUTANEOUS
  Filled 2014-02-27: qty 1

## 2014-02-27 MED ORDER — EPOETIN ALFA 10000 UNIT/ML IJ SOLN
INTRAMUSCULAR | Status: AC
  Filled 2014-02-27: qty 1

## 2014-02-28 ENCOUNTER — Other Ambulatory Visit: Payer: Self-pay

## 2014-02-28 ENCOUNTER — Encounter: Payer: Self-pay | Admitting: Cardiovascular Disease

## 2014-02-28 MED FILL — Epoetin Alfa Inj 10000 Unit/ML: INTRAMUSCULAR | Qty: 1 | Status: AC

## 2014-02-28 NOTE — Telephone Encounter (Signed)
This encounter was created in error - please disregard.

## 2014-02-28 NOTE — Telephone Encounter (Signed)
New message ° ° ° ° ° °Returning a nurses call to get test results °

## 2014-03-06 ENCOUNTER — Encounter (HOSPITAL_COMMUNITY)
Admission: RE | Admit: 2014-03-06 | Discharge: 2014-03-06 | Disposition: A | Discharge status: Home / Self Care | Payer: Medicare Other | Source: Ambulatory Visit | Attending: Nephrology | Admitting: Nephrology

## 2014-03-06 DIAGNOSIS — N183 Chronic kidney disease, stage 3 (moderate): Secondary | ICD-10-CM | POA: Insufficient documentation

## 2014-03-06 DIAGNOSIS — D631 Anemia in chronic kidney disease: Secondary | ICD-10-CM | POA: Insufficient documentation

## 2014-03-06 LAB — POCT HEMOGLOBIN-HEMACUE: Hemoglobin: 11.5 g/dL — ABNORMAL LOW (ref 13.0–17.0)

## 2014-03-06 MED ORDER — EPOETIN ALFA 10000 UNIT/ML IJ SOLN
INTRAMUSCULAR | Status: AC
  Administered 2014-03-06: 10000 [IU] via SUBCUTANEOUS
  Filled 2014-03-06: qty 1

## 2014-03-06 MED ORDER — EPOETIN ALFA 3000 UNIT/ML IJ SOLN
3000.0000 [IU] | INTRAMUSCULAR | Status: DC
  Filled 2014-03-06: qty 1

## 2014-03-14 ENCOUNTER — Encounter (HOSPITAL_COMMUNITY)
Admission: RE | Admit: 2014-03-14 | Discharge: 2014-03-14 | Disposition: A | Discharge status: Home / Self Care | Payer: Medicare Other | Source: Ambulatory Visit | Attending: Nephrology | Admitting: Nephrology

## 2014-03-14 ENCOUNTER — Inpatient Hospital Stay (HOSPITAL_COMMUNITY): Admission: RE | Admit: 2014-03-14 | Payer: Medicare Other | Source: Ambulatory Visit

## 2014-03-14 DIAGNOSIS — D631 Anemia in chronic kidney disease: Secondary | ICD-10-CM | POA: Diagnosis not present

## 2014-03-14 LAB — POCT HEMOGLOBIN-HEMACUE: Hemoglobin: 10.8 g/dL — ABNORMAL LOW (ref 13.0–17.0)

## 2014-03-14 MED ORDER — EPOETIN ALFA 3000 UNIT/ML IJ SOLN
3000.0000 [IU] | INTRAMUSCULAR | Status: DC
  Filled 2014-03-14: qty 1

## 2014-03-15 LAB — IRON AND TIBC
Iron: 53 ug/dL (ref 42–165)
SATURATION RATIOS: 22 % (ref 20–55)
TIBC: 239 ug/dL (ref 215–435)
UIBC: 186 ug/dL (ref 125–400)

## 2014-03-15 LAB — FERRITIN: Ferritin: 526 ng/mL — ABNORMAL HIGH (ref 22–322)

## 2014-03-15 MED ORDER — EPOETIN ALFA 10000 UNIT/ML IJ SOLN
INTRAMUSCULAR | Status: AC
  Administered 2014-03-14: 3000 [IU] via SUBCUTANEOUS
  Filled 2014-03-15: qty 1

## 2014-03-19 ENCOUNTER — Other Ambulatory Visit: Payer: Self-pay | Admitting: Family Medicine

## 2014-03-19 ENCOUNTER — Encounter: Payer: Self-pay | Admitting: Family Medicine

## 2014-03-20 ENCOUNTER — Other Ambulatory Visit: Payer: Self-pay

## 2014-03-20 ENCOUNTER — Encounter (HOSPITAL_COMMUNITY)
Admission: RE | Admit: 2014-03-20 | Discharge: 2014-03-20 | Disposition: A | Discharge status: Home / Self Care | Payer: Medicare Other | Source: Ambulatory Visit | Attending: Nephrology | Admitting: Nephrology

## 2014-03-20 DIAGNOSIS — D631 Anemia in chronic kidney disease: Secondary | ICD-10-CM | POA: Diagnosis not present

## 2014-03-20 LAB — POCT HEMOGLOBIN-HEMACUE: Hemoglobin: 11.2 g/dL — ABNORMAL LOW (ref 13.0–17.0)

## 2014-03-20 MED ORDER — EPOETIN ALFA 3000 UNIT/ML IJ SOLN
3000.0000 [IU] | INTRAMUSCULAR | Status: DC
  Filled 2014-03-20: qty 1

## 2014-03-20 MED ORDER — DIPHENOXYLATE-ATROPINE 2.5-0.025 MG PO TABS: ORAL_TABLET | ORAL | Status: DC

## 2014-03-20 MED ORDER — EPOETIN ALFA 10000 UNIT/ML IJ SOLN
INTRAMUSCULAR | Status: AC
  Administered 2014-03-20: 3000 [IU] via SUBCUTANEOUS
  Filled 2014-03-20: qty 1

## 2014-03-28 ENCOUNTER — Encounter (HOSPITAL_COMMUNITY)
Admission: RE | Admit: 2014-03-28 | Discharge: 2014-03-28 | Disposition: A | Discharge status: Home / Self Care | Payer: Medicare Other | Source: Ambulatory Visit | Attending: Nephrology | Admitting: Nephrology

## 2014-03-28 DIAGNOSIS — D631 Anemia in chronic kidney disease: Secondary | ICD-10-CM | POA: Diagnosis not present

## 2014-03-28 LAB — POCT HEMOGLOBIN-HEMACUE: Hemoglobin: 10.7 g/dL — ABNORMAL LOW (ref 13.0–17.0)

## 2014-03-28 MED ORDER — EPOETIN ALFA 3000 UNIT/ML IJ SOLN
3000.0000 [IU] | INTRAMUSCULAR | Status: DC
  Filled 2014-03-28: qty 1

## 2014-03-28 MED ORDER — EPOETIN ALFA 3000 UNIT/ML IJ SOLN: 3000.0000 [IU] | INTRAMUSCULAR | Status: DC

## 2014-03-29 MED ORDER — EPOETIN ALFA 10000 UNIT/ML IJ SOLN
INTRAMUSCULAR | Status: AC
  Administered 2014-03-28: 3000 [IU]
  Filled 2014-03-29: qty 1

## 2014-04-03 ENCOUNTER — Encounter (HOSPITAL_COMMUNITY)
Admission: RE | Admit: 2014-04-03 | Discharge: 2014-04-03 | Disposition: A | Discharge status: Home / Self Care | Payer: Medicare Other | Source: Ambulatory Visit | Attending: Nephrology | Admitting: Nephrology

## 2014-04-03 DIAGNOSIS — N183 Chronic kidney disease, stage 3 (moderate): Secondary | ICD-10-CM | POA: Insufficient documentation

## 2014-04-03 DIAGNOSIS — D631 Anemia in chronic kidney disease: Secondary | ICD-10-CM | POA: Insufficient documentation

## 2014-04-03 LAB — POCT HEMOGLOBIN-HEMACUE: Hemoglobin: 10.9 g/dL — ABNORMAL LOW (ref 13.0–17.0)

## 2014-04-03 MED ORDER — EPOETIN ALFA 3000 UNIT/ML IJ SOLN
3000.0000 [IU] | INTRAMUSCULAR | Status: DC
  Administered 2014-04-03: 3000 [IU] via SUBCUTANEOUS

## 2014-04-03 MED ORDER — EPOETIN ALFA 3000 UNIT/ML IJ SOLN
INTRAMUSCULAR | Status: AC
  Filled 2014-04-03: qty 1

## 2014-04-10 ENCOUNTER — Encounter (HOSPITAL_COMMUNITY)
Admission: RE | Admit: 2014-04-10 | Discharge: 2014-04-10 | Disposition: A | Discharge status: Home / Self Care | Payer: Medicare Other | Source: Ambulatory Visit | Attending: Nephrology | Admitting: Nephrology

## 2014-04-10 DIAGNOSIS — D631 Anemia in chronic kidney disease: Secondary | ICD-10-CM | POA: Diagnosis not present

## 2014-04-10 LAB — POCT HEMOGLOBIN-HEMACUE: HEMOGLOBIN: 10.6 g/dL — AB (ref 13.0–17.0)

## 2014-04-10 MED ORDER — EPOETIN ALFA 3000 UNIT/ML IJ SOLN
INTRAMUSCULAR | Status: DC
Start: 2014-04-10 — End: 2014-04-11
  Filled 2014-04-10: qty 1

## 2014-04-10 MED ORDER — EPOETIN ALFA 3000 UNIT/ML IJ SOLN
3000.0000 [IU] | INTRAMUSCULAR | Status: DC
  Administered 2014-04-10: 3000 [IU] via SUBCUTANEOUS

## 2014-04-11 ENCOUNTER — Other Ambulatory Visit: Payer: Self-pay | Admitting: Family Medicine

## 2014-04-11 NOTE — Progress Notes (Signed)
Cardiology Office Note   Date:  04/11/2014   ID:  Kurt Richard, DOB 1946-01-03, MRN [id number]  PCP:  Kristian Covey, MD  Cardiologist:   [name], MD   No chief complaint on file.  Problem list: 1. Acute on chronic systolic CHF  Echo Feb. 2014 Left ventricle: The cavity size was normal. moderate LVH. The estimated ejection fraction was in the range of 35% to 40%. There is moderate hypokinesis of the mid-distalanteroseptal myocardium. pseudonormal left ventricular filling pattern, with concomitant abnormal relaxation and increased filling pressure (grade 2 diastolic dysfunction). - Aortic valve: There was very mild stenosis. Mean gradient:28mm Hg (S). Peak gradient: 13mm Hg (S). - Mitral valve: Calcified annulus. Mildly thickened leaflets .- Left atrium: The atrium was moderately dilated. - Right ventricle: The cavity size was mildly dilated. - Right atrium: The atrium was mildly dilated.  2. C. Diff colitis. 3. Acute renal insufficiency, d/c Cr 1.54  4. CAD - recent late-presenting anterior MI 04/2011 with 3V dz s/p CABG  - postop course 04/2011 complicated by LV dysfunction requiring IABP/inotropes, ABL anemia, CVA  5. 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 Diabetes mellitus  7. Tobacco abuse  8. hypertension 9. hyperlipidemia   Kurt Richard is a 69 year old gentleman with the above-noted medical history.  He is still having problems with his abdomen and the diarrhea. The last stool sample was negative for c. Diff. He needs to have another ERCP at [facility]. He has done well from a cardiac standpoint. No further exacerbations of CHf. No angina.  April 13, 2012: Kurt Richard is doing better. He saw [name] for pre-op evaluation prior gallbladder surgery. He had an echocardiogram which revealed some improvement of his left ventricular systolic function. His ejection fraction is now 35-40%. His preop labs also revealed that he has chronic renal insufficiency with a  creatinine of 2.3. His BUN is also elevated. In addition, he also has anemia. His Lasix was decreased to 40 mg 3 times a week after seeing his elevated creatinine. His breathing remains the same. He occasionally will take an extra Lasix if he develops chest congestion.  Jun 29, 2012:  He's not having episodes of chest pain or shortness of breath. Kurt Richard is making some slow progress. He is not able to exercise because of left calf pain. He is only able to stand on his legs for several minutes.  He also has neuropathy which limits his exercise. He has not been eating any extra salt.  His baseline creatinine is 2.2. He does not want to start on dialysis.   Sept. 5, 2014:  He has done well. His Hb is still low. He goes for Procrit injection every week and iron infusions on occasion. No obvious blood in his stool.  He has had peripheral arterial evaluation and stenting of his left iliac artery:  Conclusions:  1. Severe focal stenosis in the distal left external iliac artery. Moderate to significant diffuse disease in the distal right common iliac artery and right external iliac artery.  2. Occluded left SFA in a long segment with collaterals from the profunda. Three-vessel runoff below the knee.  3. Diffusely diseased right SFA throughout its course with three-vessel runoff below the knee.  4. Successful self-expanding stent placement to the left external iliac artery.  He was started back on the plavix.  He still eats an unrestricted diet - still eats carbs and fats as well as some extra salt.   Dec. 4, 2014:  Kurt Richard is doing well. BP  at home is normal. No CP or dyspnea.  He's not eating much salt. He avoids hot dogs, and other salty foods. He has seen Dr. Hyman HopesWebb recently. He received Procrit weekly. They have discussed starting dialysis.   July 06, 2013:  Kurt Richard is doing ok he's been having some leg edema. He doubled up on his Lasix for about a week and his leg edema  has almost completely resolved. He's having problems with anemia. His hemoglobin is now finally 11.1.  Sept. 8, 2015:  Kurt Richard has been doing well from a CHF standpoint He has seen Dr. Kirke [name] who is considering a stent in right leg for PVD He is still battling anemia.   He still has diarrhea - typically after eating. Not related to lactose, He has been tested for Celiac ( negative)   April 12, 2014:  Kurt Richard is a 69 y.o. male who presents for follow up of his CHF and HTN., Has gained some weight.   Has been eating mail order desserts from [place] .    Past Medical History  Diagnosis Date  . Diabetes mellitus   . Hypertension   . Chronic systolic heart failure     echo 04/12/11: Mild LVH, EF 30-35%, mid to distal anteroseptal and apical hypokinesis, grade 2 diastolic dysfunction, moderate LAE, mild RAE.  [name]. Hyperlipidemia   . Tobacco abuse   . CAD (coronary artery disease)     acute anterior STEMI, late presentation 04/06/11 LHC 3/5 demonstrated severe ostial left main 95% stenosis and otherwise three-vessel CAD with an ejection fraction of 15%.  Emergent CABG: Dr. Dorris FetchHendrickson - Grafts: LIMA-LAD, SVG-D1, SVG-OM1, SVG-PDA and PL.  . Ischemic cardiomyopathy   . DM2 (diabetes mellitus, type 2)   . 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 Acute renal insufficiency     05/2011 admission (cr 1.54 at discharge)  . Anemia     Post-CABG  . Arthritis   . Stroke   . Sleep apnea   . Neuromuscular disorder     diabetic neuropathy  . Diarrhea     has had c diff diarrhea  . Weakness   . Bruises easily   . PAD (peripheral artery disease) 07/28/2012    Angiography 08/2012: Right: 90% focal stenosis in external iliac artery, long occlusion of SFA with 3 vessel run off . s/p slef expanding stent placement to external iliac artery. Left: borderline significant disease in comon and external iliac arteries. Diffuse 70-90% in SFA with 3 vessel run off.     Past Surgical History    Procedure Laterality Date  . Coronary artery bypass graft  04/06/2011    Procedure: CORONARY ARTERY BYPASS GRAFTING (CABG);  Surgeon: Loreli SlotSteven C Hendrickson, MD;  [facility];  Service: Open Heart Surgery;  Laterality: N/A;  Coronary Artery Bypass Graft times four utilizing the left internal mammary artery and the Right and left  greater Saphenous veins Harvested endoscopically.  . Fibroid bypass 04/06/11    . Colon surgery      rectal fissure  . Cervical disc surgery  1997 - approximate  . Colonoscopy  07/29/2011    Procedure: COLONOSCOPY;  Surgeon: Rachael Feeaniel P Jacobs, MD;  Location: [facility] ENDOSCOPY;  Service: Endoscopy;  Laterality: N/A;  . Eye surgery  2011    cataract removal  . Left heart catheterization with coronary angiogram N/A 04/06/2011    Procedure: LEFT HEART CATHETERIZATION WITH CORONARY ANGIOGRAM;  Surgeon: Iran OuchMuhammad A Arida, MD;  [facility];  Service: Cardiovascular;  Laterality: N/A;  . Abdominal aortagram N/A 08/02/2012    Procedure: ABDOMINAL Ronny Flurry;  Surgeon: Iran Ouch, MD;  Location: [facility] CATH LAB;  Service: Cardiovascular;  Laterality: N/A;  . Percutaneous stent intervention Left 08/02/2012    Procedure: PERCUTANEOUS STENT INTERVENTION;  Surgeon: Iran Ouch, MD;  Location: [facility] CATH LAB;  Service: Cardiovascular;  Laterality: Left;  stent x1 to lt ext iliac artery  . Lower extremity angiogram Bilateral 08/02/2012    Procedure: LOWER EXTREMITY ANGIOGRAM;  Surgeon: Iran Ouch, MD;  Location: [facility] CATH LAB;  Service: Cardiovascular;  Laterality: Bilateral;     Current Outpatient Prescriptions  Medication Sig Dispense Refill  . aspirin 81 MG EC tablet Take 1 tablet (81 mg total) by mouth daily.    [name] atorvastatin (LIPITOR) 20 MG tablet Take 1 tablet (20 mg total) by mouth daily. 30 tablet 5  . calcitRIOL (ROCALTROL) 0.25 MCG capsule Take 0.25 mcg by mouth daily. PATIENT ONLY TAKES 3 DAYS A WEEK    . carvedilol (COREG) 25 MG tablet TAKE 1 TABLET BY MOUTH TWICE A  DAY WITH A MEAL 60 tablet 2  . clopidogrel (PLAVIX) 75 MG tablet TAKE 1 TABLET EVERY DAY 30 tablet 6  . diphenoxylate-atropine (LOMOTIL) 2.5-0.025 MG per tablet TAKE 1 TABLET 4 TIMES A DAY AS NEEDED FOR DIARRHEA OR LOOSE STOOLS 90 tablet 0  . epoetin alfa (EPOGEN,PROCRIT) 3000 UNIT/ML injection Inject 3,000 Units into the skin once.    . fluticasone (FLONASE) 50 MCG/ACT nasal spray Place 2 sprays into the nose daily.    . furosemide (LASIX) 40 MG tablet TAKE ONE TABLET DAILY AND MAY TAKE ONE EXTRA AS NEEDED 110 tablet 3  . glucose blood (ONETOUCH VERIO) test strip Use as instructed. DX: 250.00 100 each 3  . hydrALAZINE (APRESOLINE) 25 MG tablet Take 1 tablet (25 mg total) by mouth 3 (three) times daily. 270 tablet 3  . HYDROcodone-acetaminophen (NORCO/VICODIN) 5-325 MG per tablet     . isosorbide dinitrate (ISORDIL) 20 MG tablet Take 1 tablet (20 mg total) by mouth 3 (three) times daily. 270 tablet 3  . nitroGLYCERIN (NITROSTAT) 0.4 MG SL tablet Place 0.4 mg under the tongue every 5 (five) minutes as needed for chest pain.    [name] NITROSTAT 0.4 MG SL tablet PLACE 1 TABLET UNDER THE TONGUE EVERY 5 MINS FOR CHEST PAIN AS DIRECTED 25 tablet 0  . ONETOUCH DELICA LANCETS FINE MISC Use as instructed. DX: 250.00 100 each 3  . Opium Tincture, Paregoric, 2 MG/5ML TINC TAKE 1 TEASPOONFUL BY MOUTH 4 TIMES A DAY AS NEEDED FOR DIARRHEA OR LOOSE STOOLS 120 mL 2  . oxyCODONE (OXY IR/ROXICODONE) 5 MG immediate release tablet Take 1 tablet (5 mg total) by mouth as needed. Per Dr. Georgann Housekeeper 40 tablet 0  . sodium bicarbonate 325 MG tablet Take 650 mg by mouth 2 (two) times daily.      No current facility-administered medications for this visit.    Allergies:   Diltiazem hcl and Nifedipine    Social History:  The patient  reports that he quit smoking about 3 years ago. His smoking use included Cigarettes. He smoked 0.50 packs per day. He has never used smokeless tobacco. He reports that he does not drink alcohol or use  illicit drugs.   Family History:  The patient's family history includes Congestive Heart Failure in his father; Diabetes in his mother and sister; Heart attack in his mother and sister.  ROS:  Please see the history of present illness.    Review of Systems: Constitutional:  denies fever, chills, diaphoresis, appetite change and fatigue.  HEENT: denies photophobia, eye pain, redness, hearing loss, ear pain, congestion, sore throat, rhinorrhea, sneezing, neck pain, neck stiffness and tinnitus.  Respiratory: denies SOB, DOE, cough, chest tightness, and wheezing.  Cardiovascular: denies chest pain, palpitations and leg swelling.  Gastrointestinal: denies nausea, vomiting, abdominal pain, diarrhea, constipation, blood in stool.  Genitourinary: denies dysuria, urgency, frequency, hematuria, flank pain and difficulty urinating.  Musculoskeletal: denies  myalgias, back pain, joint swelling, arthralgias and gait problem.   Skin: denies pallor, rash and wound.  Neurological: denies dizziness, seizures, syncope, weakness, light-headedness, numbness and headaches.   Hematological: denies adenopathy, easy bruising, personal or family bleeding history.  Psychiatric/ Behavioral: denies suicidal ideation, mood changes, confusion, nervousness, sleep disturbance and agitation.       All other systems are reviewed and negative.    PHYSICAL EXAM: VS:  There were no vitals taken for this visit. , BMI There is no weight on file to calculate BMI. GEN: Well nourished, well developed, in no acute distress HEENT: normal Neck: no JVD, carotid bruits, or masses Cardiac: RRR; no murmurs, rubs, or gallops,no edema  Respiratory:  clear to auscultation bilaterally, normal work of breathing GI: soft, nontender, nondistended, + BS MS: no deformity or atrophy Skin: warm and dry, no rash Neuro:  Strength and sensation are intact Psych: normal   EKG:  EKG is not ordered today.    Recent Labs: 10/17/2013:  ALT 9; BUN 63*; Creatinine 3.4*; Platelets 278.0; Potassium 5.2*; Sodium 140; TSH 23.80* 04/10/2014: Hemoglobin 10.6*    Lipid Panel    Component Value Date/Time   CHOL 234* 10/17/2013 1152   TRIG 191.0* 10/17/2013 1152   HDL 25.30* 10/17/2013 1152   CHOLHDL 9 10/17/2013 1152   VLDL 38.2 10/17/2013 1152   LDLCALC 171* 10/17/2013 1152   LDLDIRECT 155.3 10/10/2012 1032      Wt Readings from Last 3 Encounters:  12/18/13 195 lb 12.8 oz (88.814 kg)  10/17/13 199 lb (90.266 kg)  10/09/13 199 lb 9.6 oz (90.538 kg)      Other studies Reviewed: Additional studies/ records that were reviewed today include: . Review of the above records demonstrates:    ASSESSMENT AND PLAN:  1. Acute on chronic systolic CHF  Echo Feb. 2014 Left ventricle: The cavity size was normal. moderate LVH. The estimated ejection fraction was in the range of 35% to 40%. There is moderate hypokinesis of the mid-distalanteroseptal myocardium. pseudonormal left ventricular filling pattern, with concomitant abnormal relaxation and increased filling pressure (grade 2 diastolic dysfunction). - Aortic valve: There was very mild stenosis. Mean gradient:29mm Hg (S). Peak gradient: 13mm Hg (S). - Mitral valve: Calcified annulus. Mildly thickened leaflets .- Left atrium: The atrium was moderately dilated. - Right ventricle: The cavity size was mildly dilated. - Right atrium: The atrium was mildly dilated.  2. C. Diff colitis. 3. Acute renal insufficiency, d/c Cr 1.54  4. CAD - recent late-presenting anterior MI 04/2011 with 3V dz s/p CABG  - postop course 04/2011 complicated by LV dysfunction requiring IABP/inotropes, ABL anemia, CVA  5. 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 Diabetes mellitus  7. Tobacco abuse -  He has stopped smoking   8. Hypertension - BP has been elevated.  Has not been taking the hydralazine and Isordil correctly.  9. Hyperlipidemia -  We'll check fasting labs today. 10 Weight  gain :  He  has been eating lots of [employer] and [employer].   Encouraged lower carb diet and regular exercise.    Current medicines are reviewed at length with the patient today.  The patient does not have concerns regarding medicines.  The following changes have been made:  no change   Disposition:   FU with me in 6 months     Signed, Jodeci Roarty, Deloris Ping, MD  04/11/2014 1:44 PM    [facility] [address] [place], [place], [place]  [address] Phone: [phone]; Fax: [phone]

## 2014-04-12 ENCOUNTER — Encounter: Payer: Self-pay | Admitting: Cardiovascular Disease

## 2014-04-12 ENCOUNTER — Ambulatory Visit (INDEPENDENT_AMBULATORY_CARE_PROVIDER_SITE_OTHER): Payer: Medicare Other | Admitting: Cardiovascular Disease

## 2014-04-12 VITALS — BP 176/88 | HR 85 | Ht 68.0 in | Wt 203.8 lb

## 2014-04-12 DIAGNOSIS — I2510 Atherosclerotic heart disease of native coronary artery without angina pectoris: Secondary | ICD-10-CM

## 2014-04-12 DIAGNOSIS — I5022 Chronic systolic (congestive) heart failure: Secondary | ICD-10-CM

## 2014-04-12 DIAGNOSIS — I159 Secondary hypertension, unspecified: Secondary | ICD-10-CM

## 2014-04-12 LAB — HEPATIC FUNCTION PANEL
ALT: 9 U/L (ref 0–53)
AST: 10 U/L (ref 0–37)
Albumin: 4.2 g/dL (ref 3.5–5.2)
Alkaline Phosphatase: 84 U/L (ref 39–117)
BILIRUBIN DIRECT: 0.2 mg/dL (ref 0.0–0.3)
TOTAL PROTEIN: 7.2 g/dL (ref 6.0–8.3)
Total Bilirubin: 0.7 mg/dL (ref 0.2–1.2)

## 2014-04-12 LAB — BASIC METABOLIC PANEL
BUN: 71 mg/dL — ABNORMAL HIGH (ref 6–23)
CHLORIDE: 110 meq/L (ref 96–112)
CO2: 24 mEq/L (ref 19–32)
Calcium: 9.3 mg/dL (ref 8.4–10.5)
Creatinine, Ser: 3.53 mg/dL — ABNORMAL HIGH (ref 0.40–1.50)
GFR: 18.48 mL/min — ABNORMAL LOW (ref >60.00)
GLUCOSE: 91 mg/dL (ref 70–99)
POTASSIUM: 5.8 meq/L — AB (ref 3.5–5.1)
Sodium: 140 mEq/L (ref 135–145)

## 2014-04-12 LAB — LIPID PANEL
CHOLESTEROL: 117 mg/dL (ref 0–200)
HDL: 27.6 mg/dL — ABNORMAL LOW (ref >39.00)
LDL Cholesterol: 69 mg/dL (ref 0–99)
NonHDL: 89.4
TRIGLYCERIDES: 101 mg/dL (ref 0.0–149.0)
Total CHOL/HDL Ratio: 4
VLDL: 20.2 mg/dL (ref 0.0–40.0)

## 2014-04-12 NOTE — Patient Instructions (Addendum)
Your physician recommends that you continue on your current medications as directed. Please refer to the Current Medication list given to you today.  Your physician recommends that you return for lab work in: TODAY (BMET, Lipid, Liver)  Your physician recommends that you return for lab work in: 6 months (BMET,Fasting Lipid, Liver)  Your physician wants you to follow-up in: 6 months with Dr. Elease HashimotoNahser. You will receive a reminder letter in the mail two months in advance. If you don't receive a letter, please call our office to schedule the follow-up appointment.

## 2014-04-16 ENCOUNTER — Encounter (HOSPITAL_COMMUNITY)
Admission: RE | Admit: 2014-04-16 | Discharge: 2014-04-16 | Disposition: A | Discharge status: Home / Self Care | Payer: Medicare Other | Source: Ambulatory Visit | Attending: Nephrology | Admitting: Nephrology

## 2014-04-16 DIAGNOSIS — D631 Anemia in chronic kidney disease: Secondary | ICD-10-CM | POA: Diagnosis not present

## 2014-04-16 LAB — POCT HEMOGLOBIN-HEMACUE: Hemoglobin: 11.9 g/dL — ABNORMAL LOW (ref 13.0–17.0)

## 2014-04-16 MED ORDER — EPOETIN ALFA 3000 UNIT/ML IJ SOLN
3000.0000 [IU] | INTRAMUSCULAR | Status: DC
  Administered 2014-04-16: 3000 [IU] via SUBCUTANEOUS

## 2014-04-17 LAB — IRON AND TIBC
Iron: 51 ug/dL (ref 42–165)
Saturation Ratios: 20 % (ref 20–55)
TIBC: 253 ug/dL (ref 215–435)
UIBC: 202 ug/dL (ref 125–400)

## 2014-04-17 LAB — FERRITIN: Ferritin: 572 ng/mL — ABNORMAL HIGH (ref 22–322)

## 2014-04-17 MED ORDER — EPOETIN ALFA 3000 UNIT/ML IJ SOLN
INTRAMUSCULAR | Status: AC
  Filled 2014-04-17: qty 1

## 2014-04-24 ENCOUNTER — Other Ambulatory Visit: Payer: Self-pay | Admitting: Family Medicine

## 2014-04-24 ENCOUNTER — Encounter (HOSPITAL_COMMUNITY)
Admission: RE | Admit: 2014-04-24 | Discharge: 2014-04-24 | Disposition: A | Discharge status: Home / Self Care | Payer: Medicare Other | Source: Ambulatory Visit | Attending: Nephrology | Admitting: Nephrology

## 2014-04-24 DIAGNOSIS — D631 Anemia in chronic kidney disease: Secondary | ICD-10-CM | POA: Diagnosis not present

## 2014-04-24 LAB — POCT HEMOGLOBIN-HEMACUE: HEMOGLOBIN: 11 g/dL — AB (ref 13.0–17.0)

## 2014-04-24 MED ORDER — EPOETIN ALFA 3000 UNIT/ML IJ SOLN
INTRAMUSCULAR | Status: DC
Start: 2014-04-24 — End: 2014-04-25
  Filled 2014-04-24: qty 1

## 2014-04-24 MED ORDER — EPOETIN ALFA 3000 UNIT/ML IJ SOLN
3000.0000 [IU] | INTRAMUSCULAR | Status: DC
Start: 2014-04-24 — End: 2014-04-25
  Administered 2014-04-24: 3000 [IU] via SUBCUTANEOUS

## 2014-05-01 ENCOUNTER — Encounter (HOSPITAL_COMMUNITY)
Admission: RE | Admit: 2014-05-01 | Discharge: 2014-05-01 | Disposition: A | Discharge status: Home / Self Care | Payer: Medicare Other | Source: Ambulatory Visit | Attending: Nephrology | Admitting: Nephrology

## 2014-05-01 DIAGNOSIS — D631 Anemia in chronic kidney disease: Secondary | ICD-10-CM | POA: Diagnosis not present

## 2014-05-01 LAB — POCT HEMOGLOBIN-HEMACUE: Hemoglobin: 11.8 g/dL — ABNORMAL LOW (ref 13.0–17.0)

## 2014-05-01 MED ORDER — EPOETIN ALFA 3000 UNIT/ML IJ SOLN
INTRAMUSCULAR | Status: AC
  Administered 2014-05-01: 3000 [IU] via SUBCUTANEOUS
  Filled 2014-05-01: qty 1

## 2014-05-01 MED ORDER — EPOETIN ALFA 3000 UNIT/ML IJ SOLN: 3000.0000 [IU] | INTRAMUSCULAR | Status: DC

## 2014-05-09 ENCOUNTER — Encounter (HOSPITAL_COMMUNITY)
Admission: RE | Admit: 2014-05-09 | Discharge: 2014-05-09 | Disposition: A | Discharge status: Home / Self Care | Payer: Medicare Other | Source: Ambulatory Visit | Attending: Nephrology | Admitting: Nephrology

## 2014-05-09 DIAGNOSIS — D631 Anemia in chronic kidney disease: Secondary | ICD-10-CM | POA: Insufficient documentation

## 2014-05-09 DIAGNOSIS — Z79899 Other long term (current) drug therapy: Secondary | ICD-10-CM | POA: Diagnosis not present

## 2014-05-09 DIAGNOSIS — Z5181 Encounter for therapeutic drug level monitoring: Secondary | ICD-10-CM | POA: Insufficient documentation

## 2014-05-09 DIAGNOSIS — N183 Chronic kidney disease, stage 3 (moderate): Secondary | ICD-10-CM | POA: Insufficient documentation

## 2014-05-09 LAB — POCT HEMOGLOBIN-HEMACUE: HEMOGLOBIN: 11.4 g/dL — AB (ref 13.0–17.0)

## 2014-05-09 MED ORDER — EPOETIN ALFA 3000 UNIT/ML IJ SOLN
INTRAMUSCULAR | Status: AC
  Administered 2014-05-09: 3000 [IU] via SUBCUTANEOUS
  Filled 2014-05-09: qty 1

## 2014-05-09 MED ORDER — EPOETIN ALFA 3000 UNIT/ML IJ SOLN
3000.0000 [IU] | INTRAMUSCULAR | Status: DC
  Administered 2014-05-09: 3000 [IU] via SUBCUTANEOUS

## 2014-05-15 ENCOUNTER — Encounter (HOSPITAL_COMMUNITY): Payer: Medicare Other

## 2014-05-16 ENCOUNTER — Encounter (HOSPITAL_COMMUNITY)
Admission: RE | Admit: 2014-05-16 | Discharge: 2014-05-16 | Disposition: A | Discharge status: Home / Self Care | Payer: Medicare Other | Source: Ambulatory Visit | Attending: Nephrology | Admitting: Nephrology

## 2014-05-16 DIAGNOSIS — N183 Chronic kidney disease, stage 3 (moderate): Secondary | ICD-10-CM | POA: Diagnosis not present

## 2014-05-16 LAB — POCT HEMOGLOBIN-HEMACUE: Hemoglobin: 11 g/dL — ABNORMAL LOW (ref 13.0–17.0)

## 2014-05-16 MED ORDER — EPOETIN ALFA 3000 UNIT/ML IJ SOLN
3000.0000 [IU] | INTRAMUSCULAR | Status: DC
  Administered 2014-05-16: 3000 [IU] via SUBCUTANEOUS

## 2014-05-16 MED ORDER — EPOETIN ALFA 3000 UNIT/ML IJ SOLN
INTRAMUSCULAR | Status: AC
  Filled 2014-05-16: qty 1

## 2014-05-17 LAB — IRON AND TIBC
IRON: 62 ug/dL (ref 42–165)
Saturation Ratios: 25 % (ref 20–55)
TIBC: 244 ug/dL (ref 215–435)
UIBC: 182 ug/dL (ref 125–400)

## 2014-05-17 LAB — FERRITIN: Ferritin: 526 ng/mL — ABNORMAL HIGH (ref 22–322)

## 2014-05-17 NOTE — Progress Notes (Signed)
Discharged to home with family office visits in place teaching done  

## 2014-05-23 ENCOUNTER — Encounter (HOSPITAL_COMMUNITY)
Admission: RE | Admit: 2014-05-23 | Discharge: 2014-05-23 | Disposition: A | Discharge status: Home / Self Care | Payer: Medicare Other | Source: Ambulatory Visit | Attending: Nephrology | Admitting: Nephrology

## 2014-05-23 ENCOUNTER — Other Ambulatory Visit: Payer: Self-pay | Admitting: Family Medicine

## 2014-05-23 ENCOUNTER — Other Ambulatory Visit: Payer: Self-pay | Admitting: Cardiovascular Disease

## 2014-05-23 DIAGNOSIS — N183 Chronic kidney disease, stage 3 (moderate): Secondary | ICD-10-CM | POA: Diagnosis not present

## 2014-05-23 LAB — POCT HEMOGLOBIN-HEMACUE: Hemoglobin: 10.9 g/dL — ABNORMAL LOW (ref 13.0–17.0)

## 2014-05-23 MED ORDER — EPOETIN ALFA 3000 UNIT/ML IJ SOLN: 3000.0000 [IU] | INTRAMUSCULAR | Status: DC

## 2014-05-23 MED ORDER — EPOETIN ALFA 3000 UNIT/ML IJ SOLN
INTRAMUSCULAR | Status: AC
  Filled 2014-05-23: qty 1

## 2014-05-23 NOTE — Telephone Encounter (Signed)
Lomotil  Last visit 10/17/13 Last refill 03/20/14 #90 0 refill

## 2014-05-24 NOTE — Telephone Encounter (Signed)
Refill OK

## 2014-05-30 ENCOUNTER — Encounter (HOSPITAL_COMMUNITY)
Admission: RE | Admit: 2014-05-30 | Discharge: 2014-05-30 | Disposition: A | Discharge status: Home / Self Care | Payer: Medicare Other | Source: Ambulatory Visit | Attending: Nephrology | Admitting: Nephrology

## 2014-05-30 DIAGNOSIS — N183 Chronic kidney disease, stage 3 (moderate): Secondary | ICD-10-CM | POA: Diagnosis not present

## 2014-05-30 LAB — POCT HEMOGLOBIN-HEMACUE: Hemoglobin: 12 g/dL — ABNORMAL LOW (ref 13.0–17.0)

## 2014-05-30 MED ORDER — EPOETIN ALFA 3000 UNIT/ML IJ SOLN: 3000.0000 [IU] | INTRAMUSCULAR | Status: DC

## 2014-06-12 ENCOUNTER — Other Ambulatory Visit (HOSPITAL_COMMUNITY): Payer: Self-pay | Admitting: *Deleted

## 2014-06-13 ENCOUNTER — Encounter (HOSPITAL_COMMUNITY)
Admission: RE | Admit: 2014-06-13 | Discharge: 2014-06-13 | Disposition: A | Discharge status: Home / Self Care | Payer: Medicare Other | Source: Ambulatory Visit | Attending: Nephrology | Admitting: Nephrology

## 2014-06-13 DIAGNOSIS — D631 Anemia in chronic kidney disease: Secondary | ICD-10-CM | POA: Insufficient documentation

## 2014-06-13 DIAGNOSIS — N183 Chronic kidney disease, stage 3 (moderate): Secondary | ICD-10-CM | POA: Diagnosis not present

## 2014-06-13 LAB — IRON AND TIBC
IRON: 59 ug/dL (ref 45–182)
Saturation Ratios: 22 % (ref 17.9–39.5)
TIBC: 263 ug/dL (ref 250–450)
UIBC: 204 ug/dL

## 2014-06-13 LAB — FERRITIN: FERRITIN: 393 ng/mL — AB (ref 24–336)

## 2014-06-13 LAB — POCT HEMOGLOBIN-HEMACUE: HEMOGLOBIN: 11.1 g/dL — AB (ref 13.0–17.0)

## 2014-06-13 MED ORDER — EPOETIN ALFA 3000 UNIT/ML IJ SOLN
3000.0000 [IU] | INTRAMUSCULAR | Status: DC
  Administered 2014-06-13: 3000 [IU] via SUBCUTANEOUS

## 2014-06-13 MED ORDER — EPOETIN ALFA 3000 UNIT/ML IJ SOLN
INTRAMUSCULAR | Status: AC
  Filled 2014-06-13: qty 1

## 2014-06-18 ENCOUNTER — Ambulatory Visit (INDEPENDENT_AMBULATORY_CARE_PROVIDER_SITE_OTHER): Payer: Medicare Other | Admitting: Family Medicine

## 2014-06-18 ENCOUNTER — Encounter: Payer: Self-pay | Admitting: Family Medicine

## 2014-06-18 VITALS — BP 126/74 | HR 74 | Temp 97.5°F | Ht 67.5 in | Wt 205.0 lb

## 2014-06-18 DIAGNOSIS — Z23 Encounter for immunization: Secondary | ICD-10-CM

## 2014-06-18 DIAGNOSIS — Z0000 Encounter for general adult medical examination without abnormal findings: Secondary | ICD-10-CM | POA: Diagnosis not present

## 2014-06-18 DIAGNOSIS — E875 Hyperkalemia: Secondary | ICD-10-CM | POA: Diagnosis not present

## 2014-06-18 DIAGNOSIS — M5416 Radiculopathy, lumbar region: Secondary | ICD-10-CM | POA: Diagnosis not present

## 2014-06-18 DIAGNOSIS — E119 Type 2 diabetes mellitus without complications: Secondary | ICD-10-CM | POA: Diagnosis not present

## 2014-06-18 LAB — BASIC METABOLIC PANEL
BUN: 76 mg/dL — AB (ref 6–23)
CALCIUM: 8.9 mg/dL (ref 8.4–10.5)
CO2: 23 meq/L (ref 19–32)
CREATININE: 3.45 mg/dL — AB (ref 0.40–1.50)
Chloride: 111 mEq/L (ref 96–112)
GFR: 18.96 mL/min — AB (ref >60.00)
GLUCOSE: 113 mg/dL — AB (ref 70–99)
Potassium: 5.4 mEq/L — ABNORMAL HIGH (ref 3.5–5.1)
Sodium: 145 mEq/L (ref 135–145)

## 2014-06-18 LAB — CBC WITH DIFFERENTIAL/PLATELET
BASOS ABS: 0 10*3/uL (ref 0.0–0.1)
BASOS PCT: 0.1 % (ref 0.0–3.0)
EOS ABS: 0.4 10*3/uL (ref 0.0–0.7)
Eosinophils Relative: 4.9 % (ref 0.0–5.0)
HCT: 34.8 % — ABNORMAL LOW (ref 39.0–52.0)
Hemoglobin: 11.7 g/dL — ABNORMAL LOW (ref 13.0–17.0)
Lymphocytes Relative: 8.2 % — ABNORMAL LOW (ref 12.0–46.0)
Lymphs Abs: 0.7 10*3/uL (ref 0.7–4.0)
MCHC: 33.7 g/dL (ref 30.0–36.0)
MCV: 92.2 fl (ref 78.0–100.0)
MONO ABS: 0.7 10*3/uL (ref 0.1–1.0)
Monocytes Relative: 7.7 % (ref 3.0–12.0)
NEUTROS ABS: 7 10*3/uL (ref 1.4–7.7)
NEUTROS PCT: 79.1 % — AB (ref 43.0–77.0)
Platelets: 221 10*3/uL (ref 150.0–400.0)
RBC: 3.77 Mil/uL — AB (ref 4.22–5.81)
RDW: 14.9 % (ref 11.5–15.5)
WBC: 8.9 10*3/uL (ref 4.0–10.5)

## 2014-06-18 LAB — HEPATIC FUNCTION PANEL
ALT: 9 U/L (ref 0–53)
AST: 12 U/L (ref 0–37)
Albumin: 4.2 g/dL (ref 3.5–5.2)
Alkaline Phosphatase: 94 U/L (ref 39–117)
BILIRUBIN TOTAL: 0.6 mg/dL (ref 0.2–1.2)
Bilirubin, Direct: 0.1 mg/dL (ref 0.0–0.3)
Total Protein: 7.3 g/dL (ref 6.0–8.3)

## 2014-06-18 LAB — MICROALBUMIN / CREATININE URINE RATIO
Creatinine,U: 89.3 mg/dL
MICROALB UR: 159.4 mg/dL — AB (ref 0.0–1.9)
MICROALB/CREAT RATIO: 178.5 mg/g — AB (ref 0.0–30.0)

## 2014-06-18 LAB — HEMOGLOBIN A1C: HEMOGLOBIN A1C: 6.3 % (ref 4.6–6.5)

## 2014-06-18 LAB — TSH: TSH: 0.45 u[IU]/mL (ref 0.35–4.50)

## 2014-06-18 NOTE — Progress Notes (Signed)
Pre visit review using our clinic review tool, if applicable. No additional management support is needed unless otherwise documented below in the visit note. 

## 2014-06-18 NOTE — Progress Notes (Addendum)
Subjective:    Patient ID: Kurt Richard, male    DOB: 10/27/1945, 69 y.o.   MRN: [id number]  HPI Patient seen for complete physical. He has chronic problems including history of type 2 diabetes, hyperlipidemia, hypertension, CAD, ischemic cardiomyopathy, chronic kidney disease, chronic diarrhea, peripheral artery disease. He is followed by nephrology. He has history of chronic hyperkalemia. He apparently does frequently consume fruit smoothies and sometimes with high potassium juices. He has appointment with nephrology in June.  He has chronic anemia and receives Procrit injections. Does not monitor blood sugars regularly. Last A1c 6.2%. Currently not on any diabetic medications.  Patient needs Prevnar 13. Other immunizations up-to-date.  Recent new problem of left lumbar radiculopathy symptoms. Onset about 3 days ago. He has pain radiating to left buttock and down toward the knee. Sharp and intense at times. No lower extremity weakness. No urine or stool incontinence. Radiation toward the right groin.  Denies recent injury.   Using Oxycodone which helps.  Past Medical History  Diagnosis Date  . Diabetes mellitus   . Hypertension   . Chronic systolic heart failure     echo 04/12/11: Mild LVH, EF 30-35%, mid to distal anteroseptal and apical hypokinesis, grade 2 diastolic dysfunction, moderate LAE, mild RAE.  [name]. Hyperlipidemia   . Tobacco abuse   . CAD (coronary artery disease)     acute anterior STEMI, late presentation 04/06/11 LHC 3/5 demonstrated severe ostial left main 95% stenosis and otherwise three-vessel CAD with an ejection fraction of 15%.  Emergent CABG: Dr. Dorris FetchHendrickson - Grafts: LIMA-LAD, SVG-D1, SVG-OM1, SVG-PDA and PL.  . Ischemic cardiomyopathy   . DM2 (diabetes mellitus, type 2)   . 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 Acute renal insufficiency     05/2011 admission (cr 1.54 at discharge)  . Anemia     Post-CABG  . Arthritis   . Stroke   . Sleep apnea   .  Neuromuscular disorder     diabetic neuropathy  . Diarrhea     has had c diff diarrhea  . Weakness   . Bruises easily   . PAD (peripheral artery disease) 07/28/2012    Angiography 08/2012: Right: 90% focal stenosis in external iliac artery, long occlusion of SFA with 3 vessel run off . s/p slef expanding stent placement to external iliac artery. Left: borderline significant disease in comon and external iliac arteries. Diffuse 70-90% in SFA with 3 vessel run off.    Past Surgical History  Procedure Laterality Date  . Coronary artery bypass graft  04/06/2011    Procedure: CORONARY ARTERY BYPASS GRAFTING (CABG);  Surgeon: Loreli SlotSteven C Hendrickson, MD;  [facility];  Service: Open Heart Surgery;  Laterality: N/A;  Coronary Artery Bypass Graft times four utilizing the left internal mammary artery and the Right and left  greater Saphenous veins Harvested endoscopically.  . Fibroid bypass 04/06/11    . Colon surgery      rectal fissure  . Cervical disc surgery  1997 - approximate  . Colonoscopy  07/29/2011    Procedure: COLONOSCOPY;  Surgeon: Rachael Feeaniel P Jacobs, MD;  Location: [facility] ENDOSCOPY;  Service: Endoscopy;  Laterality: N/A;  . Eye surgery  2011    cataract removal  . Left heart catheterization with coronary angiogram N/A 04/06/2011    Procedure: LEFT HEART CATHETERIZATION WITH CORONARY ANGIOGRAM;  Surgeon: Iran OuchMuhammad A Arida, MD;  Location: [facility] CATH LAB;  Service: Cardiovascular;  Laterality: N/A;  . Abdominal aortagram N/A 08/02/2012  Procedure: ABDOMINAL AORTAGRAM;  Surgeon: Iran OuchMuhammad A Arida, MD;  [facility];  Service: Cardiovascular;  Laterality: N/A;  . Percutaneous stent intervention Left 08/02/2012    Procedure: PERCUTANEOUS STENT INTERVENTION;  Surgeon: Iran OuchMuhammad A Arida, MD;  Location: [facility] CATH LAB;  Service: Cardiovascular;  Laterality: Left;  stent x1 to lt ext iliac artery  . Lower extremity angiogram Bilateral 08/02/2012    Procedure: LOWER EXTREMITY ANGIOGRAM;  Surgeon: Iran OuchMuhammad A  Arida, MD;  Location: [facility] CATH LAB;  Service: Cardiovascular;  Laterality: Bilateral;    reports that he quit smoking about 3 years ago. His smoking use included Cigarettes. He smoked 0.50 packs per day. He has never used smokeless tobacco. He reports that he does not drink alcohol or use illicit drugs. family history includes Congestive Heart Failure in his father; Diabetes in his mother and sister; Heart attack in his mother and sister. Allergies  Allergen Reactions  . Diltiazem Hcl Hives and Swelling  . Nifedipine Hives and Swelling      Review of Systems  Constitutional: Positive for fatigue. Negative for fever, chills and appetite change.  HENT: Negative for trouble swallowing.   Eyes: Negative for visual disturbance.  Respiratory: Negative for cough, chest tightness and shortness of breath.   Cardiovascular: Negative for chest pain, palpitations and leg swelling.  Gastrointestinal: Positive for diarrhea. Negative for abdominal pain.  Endocrine: Negative for polydipsia and polyuria.  Musculoskeletal: Positive for back pain.  Neurological: Negative for dizziness, syncope, weakness, light-headedness, numbness and headaches.  Hematological: Negative for adenopathy.  Psychiatric/Behavioral: Negative for confusion.       Objective:   Physical Exam  Constitutional: He appears well-developed and well-nourished.  Neck: Neck supple. No thyromegaly present.  Cardiovascular: Normal rate and regular rhythm.   Pulmonary/Chest: Effort normal and breath sounds normal. No respiratory distress. He has no wheezes. He has no rales.  Abdominal: Soft. He exhibits no distension and no mass. There is no tenderness. There is no rebound and no guarding.  Musculoskeletal: He exhibits no edema.  Neurological:  Straight leg raise are negative. He has minimal reflexes knee and ankle bilaterally. Full-strength lower extremities. He has severely impaired sensory function to touch throughout lower  extremities bilaterally (chronic neuropathy)          Assessment & Plan:  #1 complete physical. Prevnar 13 given. Continue yearly flu vaccine. Obtain labs including hemoglobin A1c and urine microalbumin #2 left lumbar radiculopathy symptoms. Nonfocal exam neurologically. We recommend observation for now. He has oxycodone to take for as needed use. Follow-up promptly for any weakness or if symptoms not improving over the next couple of weeks. May need MRI to further assess. #3 chronic/recurrent hyperkalemia in pt with CKD.   NO ACE or ARB use.  Emphasized low K diet.  He already has follow up with nephrology.  Repeat BMP today. # 4 Type 2 diabetes.  Hx of good control.  Not monitoring at home.  Repeat A1C.

## 2014-06-18 NOTE — Patient Instructions (Signed)
Potassium Content of Foods  Potassium is a mineral found in many foods and drinks. It helps keep fluids and minerals balanced in your body and affects how steadily your heart beats. Potassium also helps control your blood pressure and keep your muscles and nervous system healthy.  Certain health conditions and medicines may change the balance of potassium in your body. When this happens, you can help balance your level of potassium through the foods that you do or do not eat. Your health care provider or dietitian may recommend an amount of potassium that you should have each day. The following lists of foods provide the amount of potassium (in parentheses) per serving in each item.  HIGH IN POTASSIUM   The following foods and beverages have 200 mg or more of potassium per serving:  · Apricots, 2 raw or 5 dry (200 mg).  · Artichoke, 1 medium (345 mg).  · Avocado, raw,  ¼ each (245 mg).  · Banana, 1 medium (425 mg).  · Beans, lima, or baked beans, canned, ½ cup (280 mg).  · Beans, white, canned, ½ cup (595 mg).  · Beef roast, 3 oz (320 mg).  · Beef, ground, 3 oz (270 mg).  · Beets, raw or cooked, ½ cup (260 mg).  · Bran muffin, 2 oz (300 mg).  · Broccoli, ½ cup (230 mg).  · Brussels sprouts, ½ cup (250 mg).  · Cantaloupe, ½ cup (215 mg).  · Cereal, 100% bran, ½ cup (200-400 mg).  · Cheeseburger, single, fast food, 1 each (225-400 mg).  · Chicken, 3 oz (220 mg).  · Clams, canned, 3 oz (535 mg).  · Crab, 3 oz (225 mg).  · Dates, 5 each (270 mg).  · Dried beans and peas, ½ cup (300-475 mg).  · Figs, dried, 2 each (260 mg).  · Fish: halibut, tuna, cod, snapper, 3 oz (480 mg).  · Fish: salmon, haddock, swordfish, perch, 3 oz (300 mg).  · Fish, tuna, canned 3 oz (200 mg).  · French fries, fast food, 3 oz (470 mg).  · Granola with fruit and nuts, ½ cup (200 mg).  · Grapefruit juice, ½ cup (200 mg).  · Greens, beet, ½ cup (655 mg).  · Honeydew melon, ½ cup (200 mg).  · Kale, raw, 1 cup (300 mg).  · Kiwi, 1 medium (240  mg).  · Kohlrabi, rutabaga, parsnips, ½ cup (280 mg).  · Lentils, ½ cup (365 mg).  · Mango, 1 each (325 mg).  · Milk, chocolate, 1 cup (420 mg).  · Milk: nonfat, low-fat, whole, buttermilk, 1 cup (350-380 mg).  · Molasses, 1 Tbsp (295 mg).  · Mushrooms, ½ cup (280) mg.  · Nectarine, 1 each (275 mg).  · Nuts: almonds, peanuts, hazelnuts, [place], cashew, mixed, 1 oz (200 mg).  · Nuts, pistachios, 1 oz (295 mg).  · Orange, 1 each (240 mg).  · Orange juice, ½ cup (235 mg).  · Papaya, medium, ½ fruit (390 mg).  · Peanut butter, chunky, 2 Tbsp (240 mg).  · Peanut butter, smooth, 2 Tbsp (210 mg).  · Pear, 1 medium (200 mg).  · Pomegranate, 1 whole (400 mg).  · Pomegranate juice, ½ cup (215 mg).  · Pork, 3 oz (350 mg).  · Potato chips, salted, 1 oz (465 mg).  · Potato, baked with skin, 1 medium (925 mg).  · Potatoes, boiled, ½ cup (255 mg).  · Potatoes, mashed, ½ cup (330 mg).  · Prune juice, ½ cup (  370 mg).  · Prunes, 5 each (305 mg).  · Pudding, chocolate, ½ cup (230 mg).  · Pumpkin, canned, ½ cup (250 mg).  · Raisins, seedless, ¼ cup (270 mg).  · Seeds, sunflower or pumpkin, 1 oz (240 mg).  · Soy milk, 1 cup (300 mg).  · Spinach, ½ cup (420 mg).  · Spinach, canned, ½ cup (370 mg).  · Sweet potato, baked with skin, 1 medium (450 mg).  · Swiss chard, ½ cup (480 mg).  · Tomato or vegetable juice, ½ cup (275 mg).  · Tomato sauce or puree, ½ cup (400-550 mg).  · Tomato, raw, 1 medium (290 mg).  · Tomatoes, canned, ½ cup (200-300 mg).  · Turkey, 3 oz (250 mg).  · Wheat germ, 1 oz (250 mg).  · Winter squash, ½ cup (250 mg).  · Yogurt, plain or fruited, 6 oz (260-435 mg).  · Zucchini, ½ cup (220 mg).  MODERATE IN POTASSIUM  The following foods and beverages have 50-200 mg of potassium per serving:  · Apple, 1 each (150 mg).  · Apple juice, ½ cup (150 mg).  · Applesauce, ½ cup (90 mg).  · Apricot nectar, ½ cup (140 mg).  · Asparagus, small spears, ½ cup or 6 spears (155 mg).  · Bagel, cinnamon raisin, 1 each (130 mg).  · Bagel,  egg or plain, 4 in., 1 each (70 mg).  · Beans, green, ½ cup (90 mg).  · Beans, yellow, ½ cup (190 mg).  · Beer, regular, 12 oz (100 mg).  · Beets, canned, ½ cup (125 mg).  · Blackberries, ½ cup (115 mg).  · Blueberries, ½ cup (60 mg).  · Bread, whole wheat, 1 slice (70 mg).  · Broccoli, raw, ½ cup (145 mg).  · Cabbage, ½ cup (150 mg).  · Carrots, cooked or raw, ½ cup (180 mg).  · Cauliflower, raw, ½ cup (150 mg).  · Celery, raw, ½ cup (155 mg).  · Cereal, bran flakes, ½cup (120-150 mg).  · Cheese, cottage, ½ cup (110 mg).  · Cherries, 10 each (150 mg).  · Chocolate, 1½ oz bar (165 mg).  · Coffee, brewed 6 oz (90 mg).  · Corn, ½ cup or 1 ear (195 mg).  · Cucumbers, ½ cup (80 mg).  · Egg, large, 1 each (60 mg).  · Eggplant, ½ cup (60 mg).  · Endive, raw, ½cup (80 mg).  · English muffin, 1 each (65 mg).  · Fish, orange roughy, 3 oz (150 mg).  · Frankfurter, beef or pork, 1 each (75 mg).  · Fruit cocktail, ½ cup (115 mg).  · Grape juice, ½ cup (170 mg).  · Grapefruit, ½ fruit (175 mg).  · Grapes, ½ cup (155 mg).  · Greens: kale, turnip, collard, ½ cup (110-150 mg).  · Ice cream or frozen yogurt, chocolate, ½ cup (175 mg).  · Ice cream or frozen yogurt, vanilla, ½ cup (120-150 mg).  · Lemons, limes, 1 each (80 mg).  · Lettuce, all types, 1 cup (100 mg).  · Mixed vegetables, ½ cup (150 mg).  · Mushrooms, raw, ½ cup (110 mg).  · Nuts: walnuts, pecans, or macadamia, 1 oz (125 mg).  · Oatmeal, ½ cup (80 mg).  · Okra, ½ cup (110 mg).  · Onions, raw, ½ cup (120 mg).  · Peach, 1 each (185 mg).  · Peaches, canned, ½ cup (120 mg).  · Pears, canned, ½ cup (120 mg).  · Peas, green,   frozen, ½ cup (90 mg).  · Peppers, green, ½ cup (130 mg).  · Peppers, red, ½ cup (160 mg).  · Pineapple juice, ½ cup (165 mg).  · Pineapple, fresh or canned, ½ cup (100 mg).  · Plums, 1 each (105 mg).  · Pudding, vanilla, ½ cup (150 mg).  · Raspberries, ½ cup (90 mg).  · Rhubarb, ½ cup (115 mg).  · Rice, wild, ½ cup (80 mg).  · Shrimp, 3 oz (155  mg).  · Spinach, raw, 1 cup (170 mg).  · Strawberries, ½ cup (125 mg).  · Summer squash ½ cup (175-200 mg).  · Swiss chard, raw, 1 cup (135 mg).  · Tangerines, 1 each (140 mg).  · Tea, brewed, 6 oz (65 mg).  · Turnips, ½ cup (140 mg).  · Watermelon, ½ cup (85 mg).  · Wine, red, table, 5 oz (180 mg).  · Wine, white, table, 5 oz (100 mg).  LOW IN POTASSIUM  The following foods and beverages have less than 50 mg of potassium per serving.  · Bread, white, 1 slice (30 mg).  · Carbonated beverages, 12 oz (less than 5 mg).  · Cheese, 1 oz (20-30 mg).  · Cranberries, ½ cup (45 mg).  · Cranberry juice cocktail, ½ cup (20 mg).  · Fats and oils, 1 Tbsp (less than 5 mg).  · Hummus, 1 Tbsp (32 mg).  · Nectar: papaya, mango, or pear, ½ cup (35 mg).  · Rice, white or brown, ½ cup (50 mg).  · Spaghetti or macaroni, ½ cup cooked (30 mg).  · Tortilla, flour or corn, 1 each (50 mg).  · Waffle, 4 in., 1 each (50 mg).  · Water chestnuts, ½ cup (40 mg).  Document Released: 09/01/2004 Document Revised: 01/23/2013 Document Reviewed: 12/15/2012  Exit[employer]® Patient Information ©2015 [employer]. This information is not intended to replace advice given to you by your health care provider. Make sure you discuss any questions you have with your health care provider.

## 2014-06-19 ENCOUNTER — Encounter (HOSPITAL_COMMUNITY)
Admission: RE | Admit: 2014-06-19 | Discharge: 2014-06-19 | Disposition: A | Discharge status: Home / Self Care | Payer: Medicare Other | Source: Ambulatory Visit | Attending: Nephrology | Admitting: Nephrology

## 2014-06-19 DIAGNOSIS — D631 Anemia in chronic kidney disease: Secondary | ICD-10-CM | POA: Diagnosis not present

## 2014-06-19 LAB — POCT HEMOGLOBIN-HEMACUE: HEMOGLOBIN: 11.4 g/dL — AB (ref 13.0–17.0)

## 2014-06-19 MED ORDER — EPOETIN ALFA 3000 UNIT/ML IJ SOLN
3000.0000 [IU] | INTRAMUSCULAR | Status: DC
  Administered 2014-06-19: 3000 [IU] via SUBCUTANEOUS

## 2014-06-19 MED ORDER — EPOETIN ALFA 3000 UNIT/ML IJ SOLN
INTRAMUSCULAR | Status: AC
  Filled 2014-06-19: qty 1

## 2014-06-25 ENCOUNTER — Telehealth: Payer: Self-pay | Admitting: Cardiovascular Disease

## 2014-06-25 ENCOUNTER — Encounter (HOSPITAL_COMMUNITY)
Admission: RE | Admit: 2014-06-25 | Discharge: 2014-06-25 | Disposition: A | Discharge status: Home / Self Care | Payer: Medicare Other | Source: Ambulatory Visit | Attending: Nephrology | Admitting: Nephrology

## 2014-06-25 DIAGNOSIS — N183 Chronic kidney disease, stage 3 (moderate): Secondary | ICD-10-CM | POA: Insufficient documentation

## 2014-06-25 DIAGNOSIS — D631 Anemia in chronic kidney disease: Secondary | ICD-10-CM | POA: Diagnosis not present

## 2014-06-25 DIAGNOSIS — Z5181 Encounter for therapeutic drug level monitoring: Secondary | ICD-10-CM | POA: Insufficient documentation

## 2014-06-25 DIAGNOSIS — Z79899 Other long term (current) drug therapy: Secondary | ICD-10-CM | POA: Insufficient documentation

## 2014-06-25 LAB — POCT HEMOGLOBIN-HEMACUE: HEMOGLOBIN: 10.9 g/dL — AB (ref 13.0–17.0)

## 2014-06-25 MED ORDER — EPOETIN ALFA 3000 UNIT/ML IJ SOLN
3000.0000 [IU] | INTRAMUSCULAR | Status: DC
  Administered 2014-06-25: 3000 [IU] via SUBCUTANEOUS

## 2014-06-25 MED ORDER — EPOETIN ALFA 3000 UNIT/ML IJ SOLN
INTRAMUSCULAR | Status: AC
  Filled 2014-06-25: qty 1

## 2014-06-25 NOTE — Telephone Encounter (Signed)
New problem   Need pt's most recent ejection fraction results. Please fax to [phone]. Please call if any questions.

## 2014-06-26 NOTE — Telephone Encounter (Signed)
Left detailed message on Kurt Richard's confidential voice that last EF measured 2/14 was 35-40% and to call back with additional questions or concerns.

## 2014-07-03 ENCOUNTER — Encounter (HOSPITAL_COMMUNITY)
Admission: RE | Admit: 2014-07-03 | Discharge: 2014-07-03 | Disposition: A | Discharge status: Home / Self Care | Payer: Medicare Other | Source: Ambulatory Visit | Attending: Nephrology | Admitting: Nephrology

## 2014-07-03 DIAGNOSIS — D631 Anemia in chronic kidney disease: Secondary | ICD-10-CM | POA: Diagnosis not present

## 2014-07-03 DIAGNOSIS — N183 Chronic kidney disease, stage 3 (moderate): Secondary | ICD-10-CM | POA: Insufficient documentation

## 2014-07-03 LAB — POCT HEMOGLOBIN-HEMACUE: HEMOGLOBIN: 11.2 g/dL — AB (ref 13.0–17.0)

## 2014-07-03 MED ORDER — EPOETIN ALFA 3000 UNIT/ML IJ SOLN
3000.0000 [IU] | INTRAMUSCULAR | Status: DC
  Administered 2014-07-03: 3000 [IU] via SUBCUTANEOUS

## 2014-07-03 MED ORDER — EPOETIN ALFA 3000 UNIT/ML IJ SOLN
INTRAMUSCULAR | Status: AC
  Filled 2014-07-03: qty 1

## 2014-07-04 ENCOUNTER — Other Ambulatory Visit: Payer: Self-pay | Admitting: Cardiovascular Disease

## 2014-07-04 DIAGNOSIS — I6523 Occlusion and stenosis of bilateral carotid arteries: Secondary | ICD-10-CM

## 2014-07-08 ENCOUNTER — Ambulatory Visit (HOSPITAL_COMMUNITY): Payer: Medicare Other | Attending: Cardiovascular Disease

## 2014-07-08 DIAGNOSIS — I6523 Occlusion and stenosis of bilateral carotid arteries: Secondary | ICD-10-CM

## 2014-07-10 ENCOUNTER — Encounter (HOSPITAL_COMMUNITY)
Admission: RE | Admit: 2014-07-10 | Discharge: 2014-07-10 | Disposition: A | Discharge status: Home / Self Care | Payer: Medicare Other | Source: Ambulatory Visit | Attending: Nephrology | Admitting: Nephrology

## 2014-07-10 DIAGNOSIS — D631 Anemia in chronic kidney disease: Secondary | ICD-10-CM | POA: Diagnosis not present

## 2014-07-10 LAB — FERRITIN: FERRITIN: 426 ng/mL — AB (ref 24–336)

## 2014-07-10 LAB — IRON AND TIBC
IRON: 61 ug/dL (ref 45–182)
SATURATION RATIOS: 26 % (ref 17.9–39.5)
TIBC: 238 ug/dL — ABNORMAL LOW (ref 250–450)
UIBC: 177 ug/dL

## 2014-07-10 LAB — POCT HEMOGLOBIN-HEMACUE: Hemoglobin: 11.8 g/dL — ABNORMAL LOW (ref 13.0–17.0)

## 2014-07-10 MED ORDER — EPOETIN ALFA 3000 UNIT/ML IJ SOLN
3000.0000 [IU] | INTRAMUSCULAR | Status: DC
  Administered 2014-07-10: 3000 [IU] via SUBCUTANEOUS

## 2014-07-10 MED ORDER — EPOETIN ALFA 3000 UNIT/ML IJ SOLN
INTRAMUSCULAR | Status: AC
  Administered 2014-07-10: 3000 [IU] via SUBCUTANEOUS
  Filled 2014-07-10: qty 1

## 2014-07-17 ENCOUNTER — Encounter (HOSPITAL_COMMUNITY)
Admission: RE | Admit: 2014-07-17 | Discharge: 2014-07-17 | Disposition: A | Discharge status: Home / Self Care | Payer: Medicare Other | Source: Ambulatory Visit | Attending: Nephrology | Admitting: Nephrology

## 2014-07-17 ENCOUNTER — Other Ambulatory Visit: Payer: Self-pay | Admitting: Family Medicine

## 2014-07-17 DIAGNOSIS — D631 Anemia in chronic kidney disease: Secondary | ICD-10-CM | POA: Diagnosis not present

## 2014-07-17 LAB — POCT HEMOGLOBIN-HEMACUE: Hemoglobin: 11.8 g/dL — ABNORMAL LOW (ref 13.0–17.0)

## 2014-07-17 MED ORDER — EPOETIN ALFA 3000 UNIT/ML IJ SOLN
3000.0000 [IU] | INTRAMUSCULAR | Status: DC
  Administered 2014-07-17: 3000 [IU] via SUBCUTANEOUS

## 2014-07-17 MED ORDER — EPOETIN ALFA 3000 UNIT/ML IJ SOLN
INTRAMUSCULAR | Status: AC
  Filled 2014-07-17: qty 1

## 2014-07-18 NOTE — Telephone Encounter (Signed)
Rx faxed to pharmacy  

## 2014-07-18 NOTE — Telephone Encounter (Signed)
Last visit 06/18/14 Last refill 05/24/14 #90 0 refill

## 2014-07-18 NOTE — Telephone Encounter (Signed)
Refill OK

## 2014-07-23 ENCOUNTER — Ambulatory Visit (INDEPENDENT_AMBULATORY_CARE_PROVIDER_SITE_OTHER): Payer: Medicare Other | Admitting: Cardiovascular Disease

## 2014-07-23 ENCOUNTER — Encounter: Payer: Self-pay | Admitting: Cardiovascular Disease

## 2014-07-23 VITALS — BP 182/80 | HR 76 | Ht 68.0 in | Wt 204.3 lb

## 2014-07-23 DIAGNOSIS — I6523 Occlusion and stenosis of bilateral carotid arteries: Secondary | ICD-10-CM

## 2014-07-23 DIAGNOSIS — I6529 Occlusion and stenosis of unspecified carotid artery: Secondary | ICD-10-CM | POA: Diagnosis not present

## 2014-07-23 NOTE — Patient Instructions (Signed)
Dr Allyson Sabal has referred you to Dr Myra Gianotti for evaluation of your carotid artery stenosis.  Dr Allyson Sabal will see you back as needed.

## 2014-07-23 NOTE — Assessment & Plan Note (Signed)
Kurt Richard was referred to me by Dr. Kirke Corin for consideration and evaluation of high-grade asymptomatic right internal carotid artery stenosis.he did have a stroke at the time of bypass surgery March 2013 leaving him with some residual right-sided symptoms. Recent Dopplers performed 07/08/14 revealed 80% right ICA stenosis which represents progression of disease. He has no active symptoms on that side. He does have chronic stage V chronic kidney disease followed by Dr. Hyman Hopes making him not a carotid stent candidate. He may be randomized ball in contrast to between endarterectomy and optimal medical therapy. We will screen him for inclusion/exclusion criteria. I'm referring him to Dr. Myra Gianotti for consideration of this.

## 2014-07-23 NOTE — Progress Notes (Signed)
07/23/2014 Kurt Richard   [birth year]  [id number]  Primary Physician Kristian Covey, MD Primary Cardiologist: Runell Gess MD Roseanne Reno   HPI:  [name] is a very pleasant 69 year old mildly overweight single Caucasian male patient of Dr. Andris Flurry , [facility]chette and [facility]. 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 His problems include over 100 pack years of tobacco abuse having quit 3 years ago, treated hypertension, diabetes and hyperlipidemia. He had an anterolateral myocardial infarction with subsequent coronary artery bypass grafting by Dr. Robby Sermon in March 2013. He has moderate LV dysfunction. He had a periprocedural CVA with right-sided symptoms. He does have known peripheral vascular disease status post left iliac stenting by Dr. Kirke Corin using CO2 angiography a year ago. Recent carotid Dopplers performed 07/08/14 revealed greater than 80% right ICA stenosis and 60% left. This represents progression on the right. He is a symptomatically side. He may be a candidate for CREST 2  randomize endarterectomy to optimal medical therapy.   Current Outpatient Prescriptions  Medication Sig Dispense Refill  . aspirin 81 MG EC tablet Take 1 tablet (81 mg total) by mouth daily.    [name] atorvastatin (LIPITOR) 20 MG tablet TAKE 1 TABLET EVERY DAY 30 tablet 5  . calcitRIOL (ROCALTROL) 0.25 MCG capsule Take 0.25 mcg by mouth daily.     . carvedilol (COREG) 25 MG tablet TAKE 1 TABLET BY MOUTH TWICE A DAY WITH A MEAL 60 tablet 2  . clopidogrel (PLAVIX) 75 MG tablet TAKE 1 TABLET EVERY DAY 30 tablet 6  . diphenoxylate-atropine (LOMOTIL) 2.5-0.025 MG per tablet TAKE 1 TABLET 4 TIMES A DAY AS NEEDED FOR DIARRHEA 90 tablet 0  . epoetin alfa (EPOGEN,PROCRIT) 3000 UNIT/ML injection Inject 3,000 Units into the skin once.    . fluticasone (FLONASE) 50 MCG/ACT nasal spray Place 2 sprays into the nose daily.    .  furosemide (LASIX) 40 MG tablet TAKE ONE TABLET DAILY AND MAY TAKE ONE EXTRA AS NEEDED 110 tablet 3  . glucose blood (ONETOUCH VERIO) test strip Use as instructed. DX: 250.00 100 each 3  . hydrALAZINE (APRESOLINE) 25 MG tablet Take 1 tablet (25 mg total) by mouth 3 (three) times daily. 270 tablet 3  . HYDROcodone-acetaminophen (NORCO/VICODIN) 5-325 MG per tablet Take 1 tablet by mouth every 4 (four) hours as needed for severe pain.     . isosorbide dinitrate (ISORDIL) 20 MG tablet Take 1 tablet (20 mg total) by mouth 3 (three) times daily. 270 tablet 3  . nitroGLYCERIN (NITROSTAT) 0.4 MG SL tablet Place 0.4 mg under the tongue every 5 (five) minutes as needed for chest pain.    Letta Pate DELICA LANCETS FINE MISC Use as instructed. DX: 250.00 100 each 3  . Opium Tincture, Paregoric, 2 MG/5ML TINC TAKE 1 TEASPOONFUL BY MOUTH 4 TIMES A DAY AS NEEDED FOR DIARRHEA OR LOOSE STOOLS 120 mL 2  . oxyCODONE (OXY IR/ROXICODONE) 5 MG immediate release tablet Take 1 tablet (5 mg total) by mouth as needed. Per Dr. Georgann Housekeeper 40 tablet 0  . sodium bicarbonate 650 MG tablet Take 1,300 mg by mouth 2 (two) times daily.  12   No current facility-administered medications for this visit.    Allergies  Allergen Reactions  . Diltiazem Hcl Hives and Swelling  . Nifedipine Hives and Swelling    History   Social History  . Marital Status: Single  Spouse Name: N/A  . Number of Children: 0  . Years of Education: N/A   Occupational History  . Not on file.   Social History Main Topics  . Smoking status: Former Smoker -- 0.50 packs/day    Types: Cigarettes    Quit date: 04/06/2011  . Smokeless tobacco: Never Used  . Alcohol Use: No  . Drug Use: No  . Sexual Activity: No   Other Topics Concern  . Not on file   Social History Narrative     Review of Systems: General: negative for chills, fever, night sweats or weight changes.  Cardiovascular: negative for chest pain, dyspnea on exertion, edema,  orthopnea, palpitations, paroxysmal nocturnal dyspnea or shortness of breath Dermatological: negative for rash Respiratory: negative for cough or wheezing Urologic: negative for hematuria Abdominal: negative for nausea, vomiting, diarrhea, bright red blood per rectum, melena, or hematemesis Neurologic: negative for visual changes, syncope, or dizziness All other systems reviewed and are otherwise negative except as noted above.    Blood pressure 182/80, pulse 76, height 5\' 8"  (1.727 m), weight 204 lb 4.8 oz (92.67 kg).  General appearance: alert and no distress Neck: no adenopathy, no carotid bruit, no JVD, supple, symmetrical, trachea midline and thyroid not enlarged, symmetric, no tenderness/mass/nodules Lungs: clear to auscultation bilaterally Heart: regular rate and rhythm, S1, S2 normal, no murmur, click, rub or gallop Extremities: extremities normal, atraumatic, no cyanosis or edema  EKG not performed today  ASSESSMENT AND PLAN:   Bilateral carotid artery stenosis [name] was referred to me by Dr. Kirke Corin for consideration and evaluation of high-grade asymptomatic right internal carotid artery stenosis.he did have a stroke at the time of bypass surgery March 2013 leaving him with some residual right-sided symptoms. Recent Dopplers performed 07/08/14 revealed 80% right ICA stenosis which represents progression of disease. He has no active symptoms on that side. He does have chronic stage V chronic kidney disease followed by Dr. Hyman Hopes making him not a carotid stent candidate. He may be randomized ball in contrast to between endarterectomy and optimal medical therapy. We will screen him for inclusion/exclusion criteria. I'm referring him to Dr. Myra Gianotti for consideration of this.      Runell Gess MD FACP,FACC,FAHA, [facility] 07/23/2014 9:50 AM

## 2014-07-24 ENCOUNTER — Encounter (HOSPITAL_COMMUNITY)
Admission: RE | Admit: 2014-07-24 | Discharge: 2014-07-24 | Disposition: A | Discharge status: Home / Self Care | Payer: Medicare Other | Source: Ambulatory Visit | Attending: Nephrology | Admitting: Nephrology

## 2014-07-24 DIAGNOSIS — Z79899 Other long term (current) drug therapy: Secondary | ICD-10-CM | POA: Diagnosis not present

## 2014-07-24 DIAGNOSIS — D631 Anemia in chronic kidney disease: Secondary | ICD-10-CM | POA: Diagnosis not present

## 2014-07-24 DIAGNOSIS — Z5181 Encounter for therapeutic drug level monitoring: Secondary | ICD-10-CM | POA: Insufficient documentation

## 2014-07-24 DIAGNOSIS — N183 Chronic kidney disease, stage 3 (moderate): Secondary | ICD-10-CM | POA: Diagnosis present

## 2014-07-24 LAB — POCT HEMOGLOBIN-HEMACUE: Hemoglobin: 11.4 g/dL — ABNORMAL LOW (ref 13.0–17.0)

## 2014-07-24 MED ORDER — EPOETIN ALFA 3000 UNIT/ML IJ SOLN: 3000.0000 [IU] | INTRAMUSCULAR | Status: DC

## 2014-07-24 MED ORDER — EPOETIN ALFA 3000 UNIT/ML IJ SOLN
INTRAMUSCULAR | Status: AC
  Administered 2014-07-24: 3000 [IU] via SUBCUTANEOUS
  Filled 2014-07-24: qty 1

## 2014-07-26 ENCOUNTER — Other Ambulatory Visit: Payer: Self-pay

## 2014-07-26 DIAGNOSIS — I6523 Occlusion and stenosis of bilateral carotid arteries: Secondary | ICD-10-CM

## 2014-07-31 ENCOUNTER — Encounter (HOSPITAL_COMMUNITY)
Admission: RE | Admit: 2014-07-31 | Discharge: 2014-07-31 | Disposition: A | Discharge status: Home / Self Care | Payer: Medicare Other | Source: Ambulatory Visit | Attending: Nephrology | Admitting: Nephrology

## 2014-07-31 DIAGNOSIS — D631 Anemia in chronic kidney disease: Secondary | ICD-10-CM | POA: Diagnosis not present

## 2014-07-31 LAB — POCT HEMOGLOBIN-HEMACUE: HEMOGLOBIN: 11.3 g/dL — AB (ref 13.0–17.0)

## 2014-07-31 MED ORDER — EPOETIN ALFA 10000 UNIT/ML IJ SOLN
INTRAMUSCULAR | Status: AC
  Administered 2014-07-31: 10000 [IU] via SUBCUTANEOUS
  Filled 2014-07-31: qty 1

## 2014-07-31 MED ORDER — EPOETIN ALFA 3000 UNIT/ML IJ SOLN: 3000.0000 [IU] | INTRAMUSCULAR | Status: DC

## 2014-08-06 ENCOUNTER — Encounter: Payer: Self-pay | Admitting: Cardiovascular Disease

## 2014-08-06 ENCOUNTER — Ambulatory Visit (INDEPENDENT_AMBULATORY_CARE_PROVIDER_SITE_OTHER): Payer: Medicare Other | Admitting: Cardiovascular Disease

## 2014-08-06 VITALS — BP 144/58 | HR 71 | Ht 68.0 in | Wt 205.0 lb

## 2014-08-06 DIAGNOSIS — I739 Peripheral vascular disease, unspecified: Secondary | ICD-10-CM

## 2014-08-06 DIAGNOSIS — I6523 Occlusion and stenosis of bilateral carotid arteries: Secondary | ICD-10-CM

## 2014-08-06 DIAGNOSIS — I10 Essential (primary) hypertension: Secondary | ICD-10-CM

## 2014-08-06 NOTE — Progress Notes (Signed)
Primary cardiologist: Dr. Elease Hashimoto  HPI  This is a pleasant 69 year old man who is here today for a followup visit regarding peripheral arterial disease. The patient has known history of coronary artery disease status post anterior MI in 2013. He is status post CABG. He had severe LV systolic dysfunction at that time with gradual improvement. Most recent ejection fraction in 2014 was 35-40%. He has multiple chronic medical conditions including type 2 diabetes, hypertension, hyperlipidemia and advanced chronic kidney disease. Most recent creatinine was 3.45. He suffers from chronic diarrhea.  He was evaluated in June, 2014  for severe claudication involving left calf discomfort with rest pain. His ABI was severely reduced on the left and moderately reduced on the right. I performed lower extremity angiography which showed: left : 90% focal stenosis in external iliac artery, long occlusion of SFA with 3 vessel run off . s/p slef expanding stent placement to external iliac artery. Right : borderline significant disease in comon and external iliac arteries. Diffuse 70-90% in SFA with 3 vessel run off.  He has stable claudication with no recent worsening. He was found to have worsening right carotid stenosis greater than 80%. He was referred to [facility] to consider enrolling him in crest study. However, he was excluded due to chronic kidney disease. He is going to see Dr. Myra Gianotti to see if he can be enrolled in the surgical versus medical therapy arm.  He denies any chest discomfort and no worsening dyspnea.  Allergies  Allergen Reactions  . Diltiazem Hcl Hives and Swelling  . Nifedipine Hives and Swelling     Current Outpatient Prescriptions on File Prior to Visit  Medication Sig Dispense Refill  . aspirin 81 MG EC tablet Take 1 tablet (81 mg total) by mouth daily.    [name] atorvastatin (LIPITOR) 20 MG tablet TAKE 1 TABLET EVERY DAY 30 tablet 5  . calcitRIOL (ROCALTROL) 0.25 MCG capsule Take 0.25 mcg  by mouth daily.     . carvedilol (COREG) 25 MG tablet TAKE 1 TABLET BY MOUTH TWICE A DAY WITH A MEAL 60 tablet 2  . clopidogrel (PLAVIX) 75 MG tablet TAKE 1 TABLET EVERY DAY 30 tablet 6  . diphenoxylate-atropine (LOMOTIL) 2.5-0.025 MG per tablet TAKE 1 TABLET 4 TIMES A DAY AS NEEDED FOR DIARRHEA 90 tablet 0  . epoetin alfa (EPOGEN,PROCRIT) 3000 UNIT/ML injection Inject 3,000 Units into the skin once.    . fluticasone (FLONASE) 50 MCG/ACT nasal spray Place 2 sprays into the nose daily.    . furosemide (LASIX) 40 MG tablet TAKE ONE TABLET DAILY AND MAY TAKE ONE EXTRA AS NEEDED 110 tablet 3  . glucose blood (ONETOUCH VERIO) test strip Use as instructed. DX: 250.00 100 each 3  . hydrALAZINE (APRESOLINE) 25 MG tablet Take 1 tablet (25 mg total) by mouth 3 (three) times daily. 270 tablet 3  . HYDROcodone-acetaminophen (NORCO/VICODIN) 5-325 MG per tablet Take 1 tablet by mouth every 4 (four) hours as needed for severe pain.     . isosorbide dinitrate (ISORDIL) 20 MG tablet Take 1 tablet (20 mg total) by mouth 3 (three) times daily. 270 tablet 3  . nitroGLYCERIN (NITROSTAT) 0.4 MG SL tablet Place 0.4 mg under the tongue every 5 (five) minutes as needed for chest pain.    Letta Pate DELICA LANCETS FINE MISC Use as instructed. DX: 250.00 100 each 3  . Opium Tincture, Paregoric, 2 MG/5ML TINC TAKE 1 TEASPOONFUL BY MOUTH 4 TIMES A DAY AS NEEDED FOR DIARRHEA OR  LOOSE STOOLS 120 mL 2  . oxyCODONE (OXY IR/ROXICODONE) 5 MG immediate release tablet Take 1 tablet (5 mg total) by mouth as needed. Per Dr. Georgann Housekeeper 40 tablet 0  . sodium bicarbonate 650 MG tablet Take 1,300 mg by mouth 2 (two) times daily.  12   No current facility-administered medications on file prior to visit.     Past Medical History  Diagnosis Date  . Diabetes mellitus   . Hypertension   . Chronic systolic heart failure     echo 04/12/11: Mild LVH, EF 30-35%, mid to distal anteroseptal and apical hypokinesis, grade 2 diastolic dysfunction,  moderate LAE, mild RAE.  [name] Hyperlipidemia   . Tobacco abuse   . CAD (coronary artery disease)     acute anterior STEMI, late presentation 04/06/11 LHC 3/5 demonstrated severe ostial left main 95% stenosis and otherwise three-vessel CAD with an ejection fraction of 15%.  Emergent CABG: Dr. Dorris Fetch - Grafts: LIMA-LAD, SVG-D1, SVG-OM1, SVG-PDA and PL.  . Ischemic cardiomyopathy   . DM2 (diabetes mellitus, type 2)   . 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 Acute renal insufficiency     05/2011 admission (cr 1.54 at discharge)  . Anemia     Post-CABG  . Arthritis   . Stroke   . Sleep apnea   . Neuromuscular disorder     diabetic neuropathy  . Diarrhea     has had c diff diarrhea  . Weakness   . Bruises easily   . PAD (peripheral artery disease) 07/28/2012    Angiography 08/2012: Right: 90% focal stenosis in external iliac artery, long occlusion of SFA with 3 vessel run off . s/p slef expanding stent placement to external iliac artery. Left: borderline significant disease in comon and external iliac arteries. Diffuse 70-90% in SFA with 3 vessel run off.   . Bilateral carotid artery disease      Past Surgical History  Procedure Laterality Date  . Coronary artery bypass graft  04/06/2011    Procedure: CORONARY ARTERY BYPASS GRAFTING (CABG);  Surgeon: Loreli Slot, MD;  Location: [facility] OR;  Service: Open Heart Surgery;  Laterality: N/A;  Coronary Artery Bypass Graft times four utilizing the left internal mammary artery and the Right and left  greater Saphenous veins Harvested endoscopically.  . Fibroid bypass 04/06/11    . Colon surgery      rectal fissure  . Cervical disc surgery  1997 - approximate  . Colonoscopy  07/29/2011    Procedure: COLONOSCOPY;  Surgeon: Rachael Fee, MD;  Location: [facility] ENDOSCOPY;  Service: Endoscopy;  Laterality: N/A;  . Eye surgery  2011    cataract removal  . Left heart catheterization with coronary angiogram N/A 04/06/2011    Procedure: LEFT HEART  CATHETERIZATION WITH CORONARY ANGIOGRAM;  Surgeon: Iran Ouch, MD;  Location: [facility] CATH LAB;  Service: Cardiovascular;  Laterality: N/A;  . Abdominal aortagram N/A 08/02/2012    Procedure: ABDOMINAL Ronny Flurry;  Surgeon: Iran Ouch, MD;  Location: [facility] CATH LAB;  Service: Cardiovascular;  Laterality: N/A;  . Percutaneous stent intervention Left 08/02/2012    Procedure: PERCUTANEOUS STENT INTERVENTION;  Surgeon: Iran Ouch, MD;  Location: [facility] CATH LAB;  Service: Cardiovascular;  Laterality: Left;  stent x1 to lt ext iliac artery  . Lower extremity angiogram Bilateral 08/02/2012    Procedure: LOWER EXTREMITY ANGIOGRAM;  Surgeon: Iran Ouch, MD;  Location: [facility] CATH LAB;  Service: Cardiovascular;  Laterality: Bilateral;  Family History  Problem Relation Age of Onset  . Heart attack Mother     ?4750s  . Diabetes Mother   . Heart attack Sister     8840s  . Diabetes Sister     DietitianBorther  . Congestive Heart Failure Father      History   Social History  . Marital Status: Single    Spouse Name: N/A  . Number of Children: 0  . Years of Education: N/A   Occupational History  . Not on file.   Social History Main Topics  . Smoking status: Former Smoker -- 0.50 packs/day    Types: Cigarettes    Quit date: 04/06/2011  . Smokeless tobacco: Never Used  . Alcohol Use: No  . Drug Use: No  . Sexual Activity: No   Other Topics Concern  . Not on file   Social History Narrative       PHYSICAL EXAM   BP 144/58 mmHg  Pulse 71  Ht 5\' 8"  (1.727 m)  Wt 205 lb (92.987 kg)  BMI 31.18 kg/m2  SpO2 93% Constitutional: He is oriented to person, place, and time. He appears well-developed and well-nourished. No distress.  HENT: No nasal discharge.  Head: Normocephalic and atraumatic.  Eyes: Pupils are equal and round. Right eye exhibits no discharge. Left eye exhibits no discharge.  Neck: Normal range of motion. Neck supple. No JVD present. No thyromegaly present.    Cardiovascular: Normal rate, regular rhythm, normal heart sounds and. Exam reveals no gallop and no friction rub. No murmur heard.  Pulmonary/Chest: Effort normal and breath sounds normal. No stridor. No respiratory distress. He has no wheezes. He has no rales. He exhibits no tenderness.  Abdominal: Soft. Bowel sounds are normal. He exhibits no distension. There is no tenderness. There is no rebound and no guarding.  Musculoskeletal: Normal range of motion. He exhibits mild edema worse on the left side and no tenderness.  Neurological: He is alert and oriented to person, place, and time. Coordination normal.  Skin: Skin is warm and dry. Redness in the left leg. He is not diaphoretic. No erythema. No pallor.  Psychiatric: He has a normal mood and affect. His behavior is normal. Judgment and thought content normal.  Vascular:  Femoral pulse: Diminished on both sides.  Distal pulses are not palpable.     ASSESSMENT AND PLAN

## 2014-08-06 NOTE — Assessment & Plan Note (Signed)
He has stable mild bilateral leg claudication status post left external iliac artery stenting. Repeat aortoiliac duplex in 6.

## 2014-08-06 NOTE — Assessment & Plan Note (Addendum)
Greater than 80% on the right side. He has an appointment scheduled with Dr. Myra GianottiBrabham. Given his comorbidities, he is considered at moderate risk for carotid endarterectomy from a cardiac standpoint. Given the lack of recent cardiac events and no anginal symptoms, I do not think stress testing is needed.

## 2014-08-06 NOTE — Patient Instructions (Signed)
Medication Instructions:  Your physician recommends that you continue on your current medications as directed. Please refer to the Current Medication list given to you today.  Labwork: No new orders.   Testing/Procedures: Your physician has requested that you have an aorto-iliac duplex in 6 MONTHS.  Do not eat after midnight the day before and avoid carbonated beverages  Follow-Up: Your physician wants you to follow-up in: 6 MONTHS with Dr Arida.  You will receive a reminder letter in the mail two months in advance. If you don't receive a letter, please call our office to schedule the follow-up appointment.   Any Other Special Instructions Will Be Listed Below (If Applicable).   

## 2014-08-06 NOTE — Assessment & Plan Note (Signed)
Blood pressure is controlled on current medications. 

## 2014-08-07 ENCOUNTER — Encounter (HOSPITAL_COMMUNITY)
Admission: RE | Admit: 2014-08-07 | Discharge: 2014-08-07 | Disposition: A | Discharge status: Home / Self Care | Payer: Medicare Other | Source: Ambulatory Visit | Attending: Nephrology | Admitting: Nephrology

## 2014-08-07 DIAGNOSIS — N183 Chronic kidney disease, stage 3 (moderate): Secondary | ICD-10-CM | POA: Diagnosis not present

## 2014-08-07 DIAGNOSIS — D631 Anemia in chronic kidney disease: Secondary | ICD-10-CM | POA: Diagnosis not present

## 2014-08-07 LAB — IRON AND TIBC
Iron: 48 ug/dL (ref 45–182)
Saturation Ratios: 18 % (ref 17.9–39.5)
TIBC: 266 ug/dL (ref 250–450)
UIBC: 218 ug/dL

## 2014-08-07 LAB — FERRITIN: FERRITIN: 380 ng/mL — AB (ref 24–336)

## 2014-08-07 LAB — POCT HEMOGLOBIN-HEMACUE: Hemoglobin: 11.8 g/dL — ABNORMAL LOW (ref 13.0–17.0)

## 2014-08-07 MED ORDER — EPOETIN ALFA 3000 UNIT/ML IJ SOLN
3000.0000 [IU] | INTRAMUSCULAR | Status: DC
  Administered 2014-08-07: 3000 [IU] via SUBCUTANEOUS

## 2014-08-07 MED ORDER — EPOETIN ALFA 3000 UNIT/ML IJ SOLN
INTRAMUSCULAR | Status: AC
  Filled 2014-08-07: qty 1

## 2014-08-13 ENCOUNTER — Encounter (HOSPITAL_COMMUNITY)
Admission: RE | Admit: 2014-08-13 | Discharge: 2014-08-13 | Disposition: A | Discharge status: Home / Self Care | Payer: Medicare Other | Source: Ambulatory Visit | Attending: Nephrology | Admitting: Nephrology

## 2014-08-13 ENCOUNTER — Other Ambulatory Visit: Payer: Self-pay | Admitting: *Deleted

## 2014-08-13 DIAGNOSIS — N184 Chronic kidney disease, stage 4 (severe): Secondary | ICD-10-CM

## 2014-08-13 DIAGNOSIS — D631 Anemia in chronic kidney disease: Secondary | ICD-10-CM | POA: Diagnosis not present

## 2014-08-13 DIAGNOSIS — Z01810 Encounter for preprocedural cardiovascular examination: Secondary | ICD-10-CM

## 2014-08-13 LAB — POCT HEMOGLOBIN-HEMACUE: HEMOGLOBIN: 10.7 g/dL — AB (ref 13.0–17.0)

## 2014-08-13 MED ORDER — EPOETIN ALFA 3000 UNIT/ML IJ SOLN
3000.0000 [IU] | INTRAMUSCULAR | Status: DC
  Administered 2014-08-13: 3000 [IU] via SUBCUTANEOUS

## 2014-08-13 MED ORDER — EPOETIN ALFA 3000 UNIT/ML IJ SOLN
INTRAMUSCULAR | Status: AC
  Filled 2014-08-13: qty 1

## 2014-08-14 ENCOUNTER — Encounter: Payer: Self-pay | Admitting: Surgery

## 2014-08-16 ENCOUNTER — Ambulatory Visit (INDEPENDENT_AMBULATORY_CARE_PROVIDER_SITE_OTHER): Payer: Medicare Other | Admitting: Surgery

## 2014-08-16 ENCOUNTER — Ambulatory Visit (HOSPITAL_COMMUNITY)
Admission: RE | Admit: 2014-08-16 | Discharge: 2014-08-16 | Disposition: A | Discharge status: Home / Self Care | Payer: Medicare Other | Source: Ambulatory Visit | Attending: Surgery | Admitting: Surgery

## 2014-08-16 ENCOUNTER — Other Ambulatory Visit: Payer: Self-pay

## 2014-08-16 ENCOUNTER — Other Ambulatory Visit: Payer: Self-pay | Admitting: Surgery

## 2014-08-16 ENCOUNTER — Encounter: Payer: Self-pay | Admitting: Surgery

## 2014-08-16 VITALS — BP 199/78 | HR 68 | Resp 16 | Ht 68.5 in | Wt 202.0 lb

## 2014-08-16 DIAGNOSIS — I6523 Occlusion and stenosis of bilateral carotid arteries: Secondary | ICD-10-CM | POA: Diagnosis not present

## 2014-08-16 DIAGNOSIS — I6521 Occlusion and stenosis of right carotid artery: Secondary | ICD-10-CM | POA: Diagnosis not present

## 2014-08-16 NOTE — Progress Notes (Signed)
Patient name: Kurt Richard MRN: [id number] DOB: 1945-11-28 Sex: male   Referred by: Dr Allyson Sabal  Reason for referral:  Chief Complaint  Patient presents with  . New Evaluation    carotid stenosis  referred by Dr Allyson Sabal  lab study prior    HISTORY OF PRESENT ILLNESS: This is a 69 year old gentleman who is referred today for carotid stenosis.  He has been followed with ultrasound and now has greater than 80% right carotid stenosis.  He is asymptomatic.  Specifically, he denies numbness or weakness in either extremity.  He denies slurred speech.  He denies amaurosis fugax.  The patient suffers from hypercholesterolemia which is managed with a statin.  He is a diet-controlled diabetic with hemoglobin A1c is in the 6 range.  He has stage IV renal insufficiency and is being evaluated for dialysis in the next month.  He takes injections for anemia.  He is a former smoker having quit 3 years ago.  He suffers from peripheral vascular disease and has undergone stenting in his left lower extremity 2 years ago.  He is on Plavix for this.  He also has coronary artery disease.  He has never had a heart attack but did have CABG in 2013.  He states that around the time of his CABG he had a CT scan for dysarthria which showed a chronic right PCA infarct.  The patient has chronic diarrhea which is very severe.  Past Medical History  Diagnosis Date  . Diabetes mellitus   . Hypertension   . Chronic systolic heart failure     echo 04/12/11: Mild LVH, EF 30-35%, mid to distal anteroseptal and apical hypokinesis, grade 2 diastolic dysfunction, moderate LAE, mild RAE.  [name] Hyperlipidemia   . Tobacco abuse   . CAD (coronary artery disease)     acute anterior STEMI, late presentation 04/06/11 LHC 3/5 demonstrated severe ostial left main 95% stenosis and otherwise three-vessel CAD with an ejection fraction of 15%.  Emergent CABG: Dr. Dorris Fetch - Grafts: LIMA-LAD, SVG-D1, SVG-OM1, SVG-PDA and PL.  . Ischemic  cardiomyopathy   . DM2 (diabetes mellitus, type 2)   . 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 Acute renal insufficiency     05/2011 admission (cr 1.54 at discharge)  . Anemia     Post-CABG  . Arthritis   . Stroke   . Sleep apnea   . Neuromuscular disorder     diabetic neuropathy  . Diarrhea     has had c diff diarrhea  . Weakness   . Bruises easily   . PAD (peripheral artery disease) 07/28/2012    Angiography 08/2012: Right: 90% focal stenosis in external iliac artery, long occlusion of SFA with 3 vessel run off . s/p slef expanding stent placement to external iliac artery. Left: borderline significant disease in comon and external iliac arteries. Diffuse 70-90% in SFA with 3 vessel run off.   . Bilateral carotid artery disease     Past Surgical History  Procedure Laterality Date  . Coronary artery bypass graft  04/06/2011    Procedure: CORONARY ARTERY BYPASS GRAFTING (CABG);  Surgeon: Loreli Slot, MD;  [facility] OR;  Service: Open Heart Surgery;  Laterality: N/A;  Coronary Artery Bypass Graft times four utilizing the left internal mammary artery and the Right and left  greater Saphenous veins Harvested endoscopically.  . Fibroid bypass 04/06/11    . Colon surgery      rectal fissure  .  Cervical disc surgery  1997 - approximate  . Colonoscopy  07/29/2011    Procedure: COLONOSCOPY;  Surgeon: Rachael Feeaniel P Jacobs, MD;  Location: [facility] ENDOSCOPY;  Service: Endoscopy;  Laterality: N/A;  . Eye surgery  2011    cataract removal  . Left heart catheterization with coronary angiogram N/A 04/06/2011    Procedure: LEFT HEART CATHETERIZATION WITH CORONARY ANGIOGRAM;  Surgeon: Iran OuchMuhammad A Arida, MD;  Location: [facility] CATH LAB;  Service: Cardiovascular;  Laterality: N/A;  . Abdominal aortagram N/A 08/02/2012    Procedure: ABDOMINAL Ronny FlurryAORTAGRAM;  Surgeon: Iran OuchMuhammad A Arida, MD;  Location: [facility] CATH LAB;  Service: Cardiovascular;  Laterality: N/A;  . Percutaneous stent intervention Left 08/02/2012     Procedure: PERCUTANEOUS STENT INTERVENTION;  Surgeon: Iran OuchMuhammad A Arida, MD;  Location: [facility] CATH LAB;  Service: Cardiovascular;  Laterality: Left;  stent x1 to lt ext iliac artery  . Lower extremity angiogram Bilateral 08/02/2012    Procedure: LOWER EXTREMITY ANGIOGRAM;  Surgeon: Iran OuchMuhammad A Arida, MD;  Location: [facility] CATH LAB;  Service: Cardiovascular;  Laterality: Bilateral;    History   Social History  . Marital Status: Single    Spouse Name: N/A  . Number of Children: 0  . Years of Education: N/A   Occupational History  . Not on file.   Social History Main Topics  . Smoking status: Former Smoker -- 0.50 packs/day    Types: Cigarettes    Quit date: 04/06/2011  . Smokeless tobacco: Never Used  . Alcohol Use: No  . Drug Use: No  . Sexual Activity: No   Other Topics Concern  . Not on file   Social History Narrative    Family History  Problem Relation Age of Onset  . Heart attack Mother     ?3750s  . Diabetes Mother   . Heart disease Mother   . Hyperlipidemia Mother   . Hypertension Mother   . Peripheral vascular disease Mother   . Heart attack Sister     4740s  . Deep vein thrombosis Sister   . Diabetes Sister     Dietitian[place]orther  . Heart disease Sister   . Hyperlipidemia Sister   . Hypertension Sister   . Congestive Heart Failure Father   . Heart disease Father   . Diabetes Brother   . Heart disease Brother   . Hyperlipidemia Brother     Allergies as of 08/16/2014 - Review Complete 08/16/2014  Allergen Reaction Noted  . Diltiazem hcl Hives and Swelling 04/06/2011  . Nifedipine Hives and Swelling 04/06/2011    Current Outpatient Prescriptions on File Prior to Visit  Medication Sig Dispense Refill  . aspirin 81 MG EC tablet Take 1 tablet (81 mg total) by mouth daily.    [name]. atorvastatin (LIPITOR) 20 MG tablet TAKE 1 TABLET EVERY DAY 30 tablet 5  . calcitRIOL (ROCALTROL) 0.25 MCG capsule Take 0.25 mcg by mouth daily.     . carvedilol (COREG) 25 MG tablet TAKE 1 TABLET BY  MOUTH TWICE A DAY WITH A MEAL 60 tablet 2  . clopidogrel (PLAVIX) 75 MG tablet TAKE 1 TABLET EVERY DAY 30 tablet 6  . diphenoxylate-atropine (LOMOTIL) 2.5-0.025 MG per tablet TAKE 1 TABLET 4 TIMES A DAY AS NEEDED FOR DIARRHEA 90 tablet 0  . epoetin alfa (EPOGEN,PROCRIT) 3000 UNIT/ML injection Inject 3,000 Units into the skin once.    . fluticasone (FLONASE) 50 MCG/ACT nasal spray Place 2 sprays into the nose daily.    . furosemide (LASIX) 40 MG tablet TAKE ONE  TABLET DAILY AND MAY TAKE ONE EXTRA AS NEEDED 110 tablet 3  . glucose blood (ONETOUCH VERIO) test strip Use as instructed. DX: 250.00 100 each 3  . hydrALAZINE (APRESOLINE) 25 MG tablet Take 1 tablet (25 mg total) by mouth 3 (three) times daily. 270 tablet 3  . HYDROcodone-acetaminophen (NORCO/VICODIN) 5-325 MG per tablet Take 1 tablet by mouth every 4 (four) hours as needed for severe pain.     . isosorbide dinitrate (ISORDIL) 20 MG tablet Take 1 tablet (20 mg total) by mouth 3 (three) times daily. 270 tablet 3  . nitroGLYCERIN (NITROSTAT) 0.4 MG SL tablet Place 0.4 mg under the tongue every 5 (five) minutes as needed for chest pain.    Letta Pate DELICA LANCETS FINE MISC Use as instructed. DX: 250.00 100 each 3  . Opium Tincture, Paregoric, 2 MG/5ML TINC TAKE 1 TEASPOONFUL BY MOUTH 4 TIMES A DAY AS NEEDED FOR DIARRHEA OR LOOSE STOOLS 120 mL 2  . oxyCODONE (OXY IR/ROXICODONE) 5 MG immediate release tablet Take 1 tablet (5 mg total) by mouth as needed. Per Dr. Georgann Housekeeper 40 tablet 0  . sodium bicarbonate 650 MG tablet Take 1,300 mg by mouth 2 (two) times daily.  12   No current facility-administered medications on file prior to visit.     REVIEW OF SYSTEMS: Cardiovascular: No chest pain, chest pressure, palpitations, orthopnea, or dyspnea on exertion.  Positive for leg pain.  History of phlebitis. Pulmonary: No productive cough, asthma or wheezing. Neurologic: No weakness, paresthesias, aphasia, or amaurosis. No dizziness. Hematologic: No  bleeding problems or clotting disorders. Musculoskeletal: No joint pain or joint swelling. Gastrointestinal: No blood in stool or hematemesis Genitourinary: No dysuria or hematuria. Psychiatric:: No history of major depression. Integumentary: No rashes or ulcers. Constitutional: No fever or chills.  PHYSICAL EXAMINATION:  Filed Vitals:   08/16/14 0908 08/16/14 0913 08/16/14 0914  BP: 173/91 175/82 199/78  Pulse: 77 76 68  Resp: 16    Height: 5' 8.5" (1.74 m)    Weight: 202 lb (91.627 kg)     Body mass index is 30.26 kg/(m^2). General: The patient appears their stated age.   HEENT:  No gross abnormalities Pulmonary: Respirations are non-labored Abdomen: Soft and non-tender  Musculoskeletal: There are no major deformities.   Neurologic: No focal weakness or paresthesias are detected, Skin: There are no ulcer or rashes noted. Psychiatric: The patient has normal affect. Cardiovascular: There is a regular rate and rhythm without significant murmur appreciated.  No carotid bruits.  Palpable radial pulses bilaterally.  Diagnostic Studies: I have reviewed his ultrasound studies which the right side was repeated in our office.  The right side is greater than 80%.  The left side is 60-79%    Assessment:  Asymptomatic right carotid stenosis. Plan: I reviewed the treatment options with the patient which include medical management, carotid endarterectomy, and carotid stenting.  Because of his renal disease, I do not feel he is a candidate for CREST II or carotid stenting.  We have therefore decided to proceed with right carotid endarterectomy.  I discussed the risks and benefits of the operation including the risk of stroke, nerve injury, and cardioplegic complications.  All of his questions have been answered.  I will stop his Plavix 5 days prior to his operation which is been scheduled for Thursday, July 28     V. Charlena Cross, M.D. [facility]  [place] Office: [phone] Pager:  [phone]

## 2014-08-19 ENCOUNTER — Other Ambulatory Visit: Payer: Self-pay | Admitting: Cardiovascular Disease

## 2014-08-19 ENCOUNTER — Other Ambulatory Visit: Payer: Self-pay | Admitting: Family Medicine

## 2014-08-19 NOTE — Telephone Encounter (Signed)
Refill OK

## 2014-08-19 NOTE — Telephone Encounter (Signed)
Last visit 06/18/14 Last refill 07/18/14 #90 0 refill

## 2014-08-21 ENCOUNTER — Encounter (HOSPITAL_COMMUNITY): Payer: Self-pay

## 2014-08-21 ENCOUNTER — Encounter (HOSPITAL_COMMUNITY)
Admission: RE | Admit: 2014-08-21 | Discharge: 2014-08-21 | Disposition: A | Discharge status: Home / Self Care | Payer: Medicare Other | Source: Ambulatory Visit | Attending: Nephrology | Admitting: Nephrology

## 2014-08-21 ENCOUNTER — Encounter (HOSPITAL_COMMUNITY)
Admission: RE | Admit: 2014-08-21 | Discharge: 2014-08-21 | Disposition: A | Discharge status: Home / Self Care | Payer: Medicare Other | Source: Ambulatory Visit | Attending: Surgery | Admitting: Surgery

## 2014-08-21 DIAGNOSIS — Z87891 Personal history of nicotine dependence: Secondary | ICD-10-CM | POA: Diagnosis not present

## 2014-08-21 DIAGNOSIS — I6523 Occlusion and stenosis of bilateral carotid arteries: Secondary | ICD-10-CM | POA: Insufficient documentation

## 2014-08-21 DIAGNOSIS — Z01812 Encounter for preprocedural laboratory examination: Secondary | ICD-10-CM | POA: Diagnosis not present

## 2014-08-21 DIAGNOSIS — Z0183 Encounter for blood typing: Secondary | ICD-10-CM | POA: Diagnosis not present

## 2014-08-21 DIAGNOSIS — I129 Hypertensive chronic kidney disease with stage 1 through stage 4 chronic kidney disease, or unspecified chronic kidney disease: Secondary | ICD-10-CM | POA: Insufficient documentation

## 2014-08-21 DIAGNOSIS — Z7982 Long term (current) use of aspirin: Secondary | ICD-10-CM | POA: Diagnosis not present

## 2014-08-21 DIAGNOSIS — Z79899 Other long term (current) drug therapy: Secondary | ICD-10-CM | POA: Diagnosis not present

## 2014-08-21 DIAGNOSIS — I252 Old myocardial infarction: Secondary | ICD-10-CM | POA: Diagnosis not present

## 2014-08-21 DIAGNOSIS — Z01818 Encounter for other preprocedural examination: Secondary | ICD-10-CM | POA: Diagnosis not present

## 2014-08-21 DIAGNOSIS — E1140 Type 2 diabetes mellitus with diabetic neuropathy, unspecified: Secondary | ICD-10-CM | POA: Insufficient documentation

## 2014-08-21 DIAGNOSIS — I2510 Atherosclerotic heart disease of native coronary artery without angina pectoris: Secondary | ICD-10-CM | POA: Diagnosis not present

## 2014-08-21 DIAGNOSIS — Z951 Presence of aortocoronary bypass graft: Secondary | ICD-10-CM | POA: Insufficient documentation

## 2014-08-21 DIAGNOSIS — Z8673 Personal history of transient ischemic attack (TIA), and cerebral infarction without residual deficits: Secondary | ICD-10-CM | POA: Insufficient documentation

## 2014-08-21 DIAGNOSIS — N184 Chronic kidney disease, stage 4 (severe): Secondary | ICD-10-CM | POA: Insufficient documentation

## 2014-08-21 DIAGNOSIS — D631 Anemia in chronic kidney disease: Secondary | ICD-10-CM | POA: Diagnosis not present

## 2014-08-21 DIAGNOSIS — Z7902 Long term (current) use of antithrombotics/antiplatelets: Secondary | ICD-10-CM | POA: Insufficient documentation

## 2014-08-21 DIAGNOSIS — E1122 Type 2 diabetes mellitus with diabetic chronic kidney disease: Secondary | ICD-10-CM | POA: Diagnosis not present

## 2014-08-21 HISTORY — DX: Failed or difficult intubation, initial encounter: T88.4XXA

## 2014-08-21 LAB — COMPREHENSIVE METABOLIC PANEL
ALT: 10 U/L — ABNORMAL LOW (ref 17–63)
AST: 13 U/L — AB (ref 15–41)
Albumin: 3.9 g/dL (ref 3.5–5.0)
Alkaline Phosphatase: 85 U/L (ref 38–126)
Anion gap: 9 (ref 5–15)
BUN: 60 mg/dL — AB (ref 6–20)
CO2: 21 mmol/L — AB (ref 22–32)
Calcium: 8.4 mg/dL — ABNORMAL LOW (ref 8.9–10.3)
Chloride: 110 mmol/L (ref 101–111)
Creatinine, Ser: 3.4 mg/dL — ABNORMAL HIGH (ref 0.61–1.24)
GFR calc Af Amer: 20 mL/min — ABNORMAL LOW (ref >60)
GFR, EST NON AFRICAN AMERICAN: 17 mL/min — AB (ref >60)
Glucose, Bld: 97 mg/dL (ref 65–99)
POTASSIUM: 5.3 mmol/L — AB (ref 3.5–5.1)
Sodium: 140 mmol/L (ref 135–145)
Total Bilirubin: 0.8 mg/dL (ref 0.3–1.2)
Total Protein: 7.1 g/dL (ref 6.5–8.1)

## 2014-08-21 LAB — CBC
HCT: 36.2 % — ABNORMAL LOW (ref 39.0–52.0)
Hemoglobin: 12.1 g/dL — ABNORMAL LOW (ref 13.0–17.0)
MCH: 31.4 pg (ref 26.0–34.0)
MCHC: 33.4 g/dL (ref 30.0–36.0)
MCV: 94 fL (ref 78.0–100.0)
Platelets: 221 10*3/uL (ref 150–400)
RBC: 3.85 MIL/uL — ABNORMAL LOW (ref 4.22–5.81)
RDW: 14.7 % (ref 11.5–15.5)
WBC: 10 10*3/uL (ref 4.0–10.5)

## 2014-08-21 LAB — TYPE AND SCREEN
ABO/RH(D): O POS
Antibody Screen: NEGATIVE

## 2014-08-21 LAB — PROTIME-INR
INR: 1.14 (ref 0.00–1.49)
Prothrombin Time: 14.8 seconds (ref 11.6–15.2)

## 2014-08-21 LAB — URINALYSIS, ROUTINE W REFLEX MICROSCOPIC
BILIRUBIN URINE: NEGATIVE
GLUCOSE, UA: NEGATIVE mg/dL
KETONES UR: NEGATIVE mg/dL
Leukocytes, UA: NEGATIVE
NITRITE: NEGATIVE
Protein, ur: >300 mg/dL — AB
Specific Gravity, Urine: 1.014 (ref 1.005–1.030)
Urobilinogen, UA: 1 mg/dL (ref 0.0–1.0)
pH: 7.5 (ref 5.0–8.0)

## 2014-08-21 LAB — URINE MICROSCOPIC-ADD ON

## 2014-08-21 LAB — GLUCOSE, CAPILLARY: Glucose-Capillary: 96 mg/dL (ref 65–99)

## 2014-08-21 LAB — SURGICAL PCR SCREEN
MRSA, PCR: NEGATIVE
Staphylococcus aureus: NEGATIVE

## 2014-08-21 LAB — POCT HEMOGLOBIN-HEMACUE: Hemoglobin: 11.5 g/dL — ABNORMAL LOW (ref 13.0–17.0)

## 2014-08-21 LAB — APTT: APTT: 31 s (ref 24–37)

## 2014-08-21 MED ORDER — EPOETIN ALFA 3000 UNIT/ML IJ SOLN
3000.0000 [IU] | INTRAMUSCULAR | Status: DC
  Administered 2014-08-21: 3000 [IU] via SUBCUTANEOUS

## 2014-08-21 MED ORDER — EPOETIN ALFA 3000 UNIT/ML IJ SOLN
INTRAMUSCULAR | Status: AC
  Filled 2014-08-21: qty 1

## 2014-08-21 NOTE — Pre-Procedure Instructions (Signed)
    Betsey HolidayHoward L Gordner  08/21/2014      [id number] - [place], [place] - [address] [place] [id number] Phone: [phone] Fax: [phone]    Your procedure is scheduled on  Thursday 08/29/14  [facility] at 1200 P.M.  Call this number if you have problems the morning of surgery:  [phone]   Remember:  Do not eat food or drink liquids after midnight.  Take these medicines the morning of surgery with A SIP OF WATER   CARVEDILOL(COREG), LOMOTIL, HYDRALAZINE, ISOSORBIDE DINITRATE (ISORDIL), OXYCODONE IF NEEDED   Do not wear jewelry, make-up or nail polish.  Do not wear lotions, powders, or perfumes.  You may wear deodorant.  Do not shave 48 hours prior to surgery.  Men may shave face and neck.  Do not bring valuables to the hospital.  [facility] is not responsible for any belongings or valuables.  Contacts, dentures or bridgework may not be worn into surgery.  Leave your suitcase in the car.  After surgery it may be brought to your room.  For patients admitted to the hospital, discharge time will be determined by your treatment team.  Patients discharged the day of surgery will not be allowed to drive home.   Name and phone number of your driver:    Special instructions: .  Please read over the following fact sheets that you were given. Pain Booklet, Coughing and Deep Breathing, Blood Transfusion Information, MRSA Information and Surgical Site Infection Prevention

## 2014-08-22 LAB — HEMOGLOBIN A1C
Hgb A1c MFr Bld: 6 % — ABNORMAL HIGH (ref 4.8–5.6)
MEAN PLASMA GLUCOSE: 126 mg/dL

## 2014-08-22 NOTE — Progress Notes (Signed)
Anesthesia Chart Review: Patient is a 69 year old male scheduled for right CEA on 08/29/14 by Dr. Trula Slade.  History includes former smoker, CKD stage IV, CAD/STEMI (late presentation) s/p CABG 04/06/11 (LIMA-LAD, SVG-D1, SVG-OM1, SVG-PDA-PLA), chronic systolic CHF, ischemic CM, HTN, HLD, DM2 with neuropathy, cardioembolic CVA post-CABG, bruises easily, anemia, PAD s/p left EIA stent '14, carotid artery disease, chronic diarrhea. BMI is consistent with mild obesity. PCP is listed as Dr. Carolann Littler. Nephrologist is Dr. Edrick Oh, last visit 08/01/14. Cardiologists have been Dr. Fletcher Anon and Dr. Acie Fredrickson.   For anesthesia history, he reported difficult intubation at the time of previous c-spine surgery, but no reported issues at the time of his 2013 CABG. Anesthesia records from 04/06/11 indicate he had IV induction, grade II view, MAC and 3, stylet used to insert 8.0 mm ETT under DL.    Cardiologist Dr. Fletcher Anon felt patient would be "moderate risk" for this procedure from a cardiac standpoint. Given lack of recent cardiac events or anginal symptoms, he did not feel patient would require a stress test.  Meds include ASA 81 mg, Plavix, Lipitor, calcitriol, Coreg, Epogen, Flonase, Lasix, hydralazine, Norco, Isordil, Nito, Opium Tincture, oxy IR, sodium bicarbonate. Dr. Trula Slade asked him to hold Plavix 5 days prior to surgery.  03/09/12 Echo: Study Conclusions - Left ventricle: The cavity size was normal. Wall thickness was increased in a pattern of moderate LVH. Systolic function was moderately reduced. The estimated ejection fraction was in the range of 35% to 40%. There is moderate hypokinesis of the mid-distalanteroseptal myocardium. Features are consistent with a pseudonormal left ventricular filling pattern, with concomitant abnormal relaxation and increased filling pressure (grade 2 diastolic dysfunction). - Aortic valve: There was very mild stenosis. Mean gradient: 29m Hg (S). Peak  gradient: 186mHg (S). - Mitral valve: Calcified annulus. Mildly thickened leaflets. - Left atrium: The atrium was moderately dilated. - Right ventricle: The cavity size was mildly dilated. - Right atrium: The atrium was mildly dilated.  04/05/13 LHC (Pre-CABG): Left ventriculography: Left ventricular systolic function is severely reduced , LVEF is estimated at 15 %, there is mild mitral regurgitation which might be underestimated. There is akinesis of the mid/distal anterior and distal inferior walls Dyskinesis of the apex. Final Conclusions:  1. Acute anterior myocardial infarction with late presentation. 2. Severe left main and three-vessel coronary artery disease. The LAD is occluded with very little collaterals. 3. Severely reduced LV systolic function. 4. Severely elevated left ventricular end-diastolic pressure and borderline hypotension likely due to early cardiogenic shock. 5. Intra-aortic balloon placement. Recommendations: Emergent CABG.  12/18/13 EKG: SR with first degree AVB, minimal voltage criteria for LVH, T wave abnormality, consider lateral ischemia.  Carotid duplex 80-99% RICA stenosis (08/02/67/67 6089-38%ICA stenosis (6/1/0/17  Preoperative labs noted. K 5.3, BUN 60, Cr 3.40, eGFR 17, H/H 12.1/36.2. A1C 6.0. Comparison labs from CKD show a BUN 77, Cr 3.24, eGFR 19. Dr. WeJason Nestote indicates that "Not ready to undergo placement of fistula and kidney options yet. Anticipate will need placement of AVF at some point. Hesitant due to Sister experience with ESRD and access issues."  Patient has been cleared by cardiology.  He has known CKD stage IV with overall stable renal function since his nephrology visit last month. I'll order an ISTAT8 for the day of surgery to re-evaluate, particular since his was mildly hyperkalemic. If no acute changes and labs are acceptable then I would anticipate that he could proceed as planned.    AlMyra GianottiPA-C [facility]  Short Stay  Center/Anesthesiology Phone [phone] 08/22/2014 5:24 PM

## 2014-08-28 ENCOUNTER — Encounter (HOSPITAL_COMMUNITY)
Admission: RE | Admit: 2014-08-28 | Discharge: 2014-08-28 | Disposition: A | Discharge status: Home / Self Care | Payer: Medicare Other | Source: Ambulatory Visit | Attending: Nephrology | Admitting: Nephrology

## 2014-08-28 DIAGNOSIS — D631 Anemia in chronic kidney disease: Secondary | ICD-10-CM | POA: Diagnosis not present

## 2014-08-28 LAB — POCT HEMOGLOBIN-HEMACUE: Hemoglobin: 10.7 g/dL — ABNORMAL LOW (ref 13.0–17.0)

## 2014-08-28 MED ORDER — EPOETIN ALFA 3000 UNIT/ML IJ SOLN
INTRAMUSCULAR | Status: AC
  Filled 2014-08-28: qty 1

## 2014-08-28 MED ORDER — SODIUM CHLORIDE 0.9 % IV SOLN
INTRAVENOUS | Status: DC
Start: 2014-08-29 — End: 2014-08-29
  Administered 2014-08-29 (×2): via INTRAVENOUS

## 2014-08-28 MED ORDER — EPOETIN ALFA 3000 UNIT/ML IJ SOLN
3000.0000 [IU] | INTRAMUSCULAR | Status: DC
  Administered 2014-08-28: 3000 [IU] via SUBCUTANEOUS

## 2014-08-28 MED ORDER — DEXTROSE 5 % IV SOLN
1.5000 g | INTRAVENOUS | Status: AC
  Administered 2014-08-29: 1.5 g via INTRAVENOUS
  Filled 2014-08-28: qty 1.5

## 2014-08-28 NOTE — Progress Notes (Signed)
Patient stated surgeon's office already instructed him to arrive at 1115 am.

## 2014-08-29 ENCOUNTER — Inpatient Hospital Stay (HOSPITAL_COMMUNITY): Payer: Medicare Other | Admitting: Vascular Surgery

## 2014-08-29 ENCOUNTER — Encounter (HOSPITAL_COMMUNITY)
Admission: RE | Disposition: A | Discharge status: Home / Self Care | Payer: Self-pay | Source: Ambulatory Visit | Attending: Surgery

## 2014-08-29 ENCOUNTER — Inpatient Hospital Stay (HOSPITAL_COMMUNITY): Payer: Medicare Other

## 2014-08-29 ENCOUNTER — Inpatient Hospital Stay (HOSPITAL_COMMUNITY): Payer: Medicare Other | Admitting: Anesthesiology

## 2014-08-29 ENCOUNTER — Encounter (HOSPITAL_COMMUNITY): Payer: Self-pay | Admitting: General Practice

## 2014-08-29 ENCOUNTER — Inpatient Hospital Stay (HOSPITAL_COMMUNITY)
Admission: RE | Admit: 2014-08-29 | Discharge: 2014-09-01 | DRG: 981 | Disposition: A | Discharge status: Home / Self Care | Payer: Medicare Other | Source: Ambulatory Visit | Attending: Surgery | Admitting: Surgery

## 2014-08-29 DIAGNOSIS — I120 Hypertensive chronic kidney disease with stage 5 chronic kidney disease or end stage renal disease: Secondary | ICD-10-CM | POA: Diagnosis present

## 2014-08-29 DIAGNOSIS — Z8673 Personal history of transient ischemic attack (TIA), and cerebral infarction without residual deficits: Secondary | ICD-10-CM | POA: Diagnosis not present

## 2014-08-29 DIAGNOSIS — Z888 Allergy status to other drugs, medicaments and biological substances status: Secondary | ICD-10-CM

## 2014-08-29 DIAGNOSIS — I6529 Occlusion and stenosis of unspecified carotid artery: Secondary | ICD-10-CM | POA: Diagnosis present

## 2014-08-29 DIAGNOSIS — I739 Peripheral vascular disease, unspecified: Secondary | ICD-10-CM | POA: Diagnosis present

## 2014-08-29 DIAGNOSIS — I350 Nonrheumatic aortic (valve) stenosis: Secondary | ICD-10-CM

## 2014-08-29 DIAGNOSIS — E1122 Type 2 diabetes mellitus with diabetic chronic kidney disease: Secondary | ICD-10-CM | POA: Diagnosis present

## 2014-08-29 DIAGNOSIS — I6521 Occlusion and stenosis of right carotid artery: Principal | ICD-10-CM | POA: Diagnosis present

## 2014-08-29 DIAGNOSIS — Z7902 Long term (current) use of antithrombotics/antiplatelets: Secondary | ICD-10-CM

## 2014-08-29 DIAGNOSIS — N183 Chronic kidney disease, stage 3 unspecified: Secondary | ICD-10-CM | POA: Diagnosis present

## 2014-08-29 DIAGNOSIS — Z79899 Other long term (current) drug therapy: Secondary | ICD-10-CM

## 2014-08-29 DIAGNOSIS — I469 Cardiac arrest, cause unspecified: Secondary | ICD-10-CM | POA: Diagnosis not present

## 2014-08-29 DIAGNOSIS — Z79891 Long term (current) use of opiate analgesic: Secondary | ICD-10-CM | POA: Diagnosis not present

## 2014-08-29 DIAGNOSIS — I252 Old myocardial infarction: Secondary | ICD-10-CM

## 2014-08-29 DIAGNOSIS — N186 End stage renal disease: Secondary | ICD-10-CM | POA: Diagnosis not present

## 2014-08-29 DIAGNOSIS — I255 Ischemic cardiomyopathy: Secondary | ICD-10-CM

## 2014-08-29 DIAGNOSIS — Z833 Family history of diabetes mellitus: Secondary | ICD-10-CM

## 2014-08-29 DIAGNOSIS — I9589 Other hypotension: Secondary | ICD-10-CM | POA: Diagnosis not present

## 2014-08-29 DIAGNOSIS — Z7982 Long term (current) use of aspirin: Secondary | ICD-10-CM | POA: Diagnosis not present

## 2014-08-29 DIAGNOSIS — I2510 Atherosclerotic heart disease of native coronary artery without angina pectoris: Secondary | ICD-10-CM | POA: Diagnosis present

## 2014-08-29 DIAGNOSIS — Z8249 Family history of ischemic heart disease and other diseases of the circulatory system: Secondary | ICD-10-CM

## 2014-08-29 DIAGNOSIS — E785 Hyperlipidemia, unspecified: Secondary | ICD-10-CM | POA: Diagnosis present

## 2014-08-29 DIAGNOSIS — Z87891 Personal history of nicotine dependence: Secondary | ICD-10-CM | POA: Diagnosis not present

## 2014-08-29 DIAGNOSIS — D649 Anemia, unspecified: Secondary | ICD-10-CM | POA: Diagnosis present

## 2014-08-29 DIAGNOSIS — Z951 Presence of aortocoronary bypass graft: Secondary | ICD-10-CM

## 2014-08-29 DIAGNOSIS — E780 Pure hypercholesterolemia: Secondary | ICD-10-CM | POA: Diagnosis present

## 2014-08-29 DIAGNOSIS — Z955 Presence of coronary angioplasty implant and graft: Secondary | ICD-10-CM

## 2014-08-29 DIAGNOSIS — E1140 Type 2 diabetes mellitus with diabetic neuropathy, unspecified: Secondary | ICD-10-CM | POA: Diagnosis present

## 2014-08-29 DIAGNOSIS — I10 Essential (primary) hypertension: Secondary | ICD-10-CM | POA: Diagnosis not present

## 2014-08-29 DIAGNOSIS — L8990 Pressure ulcer of unspecified site, unspecified stage: Secondary | ICD-10-CM | POA: Insufficient documentation

## 2014-08-29 DIAGNOSIS — I5022 Chronic systolic (congestive) heart failure: Secondary | ICD-10-CM

## 2014-08-29 DIAGNOSIS — S2239XA Fracture of one rib, unspecified side, initial encounter for closed fracture: Secondary | ICD-10-CM

## 2014-08-29 HISTORY — PX: ENDARTERECTOMY: SHX5162

## 2014-08-29 LAB — COMPREHENSIVE METABOLIC PANEL
ALBUMIN: 3.3 g/dL — AB (ref 3.5–5.0)
ALK PHOS: 87 U/L (ref 38–126)
ALT: 57 U/L (ref 17–63)
AST: 56 U/L — ABNORMAL HIGH (ref 15–41)
Anion gap: 10 (ref 5–15)
BILIRUBIN TOTAL: 0.8 mg/dL (ref 0.3–1.2)
BUN: 70 mg/dL — ABNORMAL HIGH (ref 6–20)
CHLORIDE: 112 mmol/L — AB (ref 101–111)
CO2: 20 mmol/L — ABNORMAL LOW (ref 22–32)
Calcium: 8.8 mg/dL — ABNORMAL LOW (ref 8.9–10.3)
Creatinine, Ser: 3.49 mg/dL — ABNORMAL HIGH (ref 0.61–1.24)
GFR calc Af Amer: 19 mL/min — ABNORMAL LOW (ref >60)
GFR calc non Af Amer: 17 mL/min — ABNORMAL LOW (ref >60)
GLUCOSE: 166 mg/dL — AB (ref 65–99)
Potassium: 4.4 mmol/L (ref 3.5–5.1)
Sodium: 142 mmol/L (ref 135–145)
Total Protein: 6 g/dL — ABNORMAL LOW (ref 6.5–8.1)

## 2014-08-29 LAB — POCT I-STAT, CHEM 8
BUN: 72 mg/dL — ABNORMAL HIGH (ref 6–20)
CALCIUM ION: 1.12 mmol/L — AB (ref 1.13–1.30)
CHLORIDE: 110 mmol/L (ref 101–111)
Creatinine, Ser: 3.9 mg/dL — ABNORMAL HIGH (ref 0.61–1.24)
Glucose, Bld: 115 mg/dL — ABNORMAL HIGH (ref 65–99)
HEMATOCRIT: 34 % — AB (ref 39.0–52.0)
Hemoglobin: 11.6 g/dL — ABNORMAL LOW (ref 13.0–17.0)
Potassium: 4.8 mmol/L (ref 3.5–5.1)
Sodium: 144 mmol/L (ref 135–145)
TCO2: 19 mmol/L (ref 0–100)

## 2014-08-29 LAB — PROTIME-INR
INR: 1.24 (ref 0.00–1.49)
Prothrombin Time: 15.8 seconds — ABNORMAL HIGH (ref 11.6–15.2)

## 2014-08-29 LAB — CBC
HEMATOCRIT: 31.1 % — AB (ref 39.0–52.0)
Hemoglobin: 10.3 g/dL — ABNORMAL LOW (ref 13.0–17.0)
MCH: 31 pg (ref 26.0–34.0)
MCHC: 33.1 g/dL (ref 30.0–36.0)
MCV: 93.7 fL (ref 78.0–100.0)
Platelets: 182 10*3/uL (ref 150–400)
RBC: 3.32 MIL/uL — AB (ref 4.22–5.81)
RDW: 14.6 % (ref 11.5–15.5)
WBC: 10.3 10*3/uL (ref 4.0–10.5)

## 2014-08-29 LAB — TROPONIN I
Troponin I: 0.06 ng/mL — ABNORMAL HIGH (ref <0.031)
Troponin I: 0.22 ng/mL — ABNORMAL HIGH (ref <0.031)

## 2014-08-29 LAB — GLUCOSE, CAPILLARY
GLUCOSE-CAPILLARY: 137 mg/dL — AB (ref 65–99)
Glucose-Capillary: 90 mg/dL (ref 65–99)

## 2014-08-29 SURGERY — ENDARTERECTOMY, CAROTID
Anesthesia: General | Site: Neck | Laterality: Right

## 2014-08-29 MED ORDER — 0.9 % SODIUM CHLORIDE (POUR BTL) OPTIME
TOPICAL | Status: DC | PRN
  Administered 2014-08-29: 1000 mL

## 2014-08-29 MED ORDER — ACETAMINOPHEN 160 MG/5ML PO SOLN: 325.0000 mg | ORAL | Status: DC | PRN

## 2014-08-29 MED ORDER — SODIUM CHLORIDE 0.9 % IR SOLN
Status: DC | PRN
  Administered 2014-08-29: 500 mL

## 2014-08-29 MED ORDER — METOPROLOL TARTRATE 1 MG/ML IV SOLN: 2.0000 mg | INTRAVENOUS | Status: DC | PRN

## 2014-08-29 MED ORDER — CALCIUM CHLORIDE 10 % IV SOLN
INTRAVENOUS | Status: DC | PRN
  Administered 2014-08-29: .9 g via INTRAVENOUS
  Administered 2014-08-29: .1 g via INTRAVENOUS

## 2014-08-29 MED ORDER — GUAIFENESIN-DM 100-10 MG/5ML PO SYRP: 15.0000 mL | ORAL_SOLUTION | ORAL | Status: DC | PRN

## 2014-08-29 MED ORDER — FENTANYL CITRATE (PF) 100 MCG/2ML IJ SOLN
INTRAMUSCULAR | Status: AC
  Filled 2014-08-29: qty 2

## 2014-08-29 MED ORDER — POTASSIUM CHLORIDE CRYS ER 20 MEQ PO TBCR: 20.0000 meq | EXTENDED_RELEASE_TABLET | Freq: Once | ORAL | Status: DC

## 2014-08-29 MED ORDER — PROPOFOL 10 MG/ML IV BOLUS
INTRAVENOUS | Status: AC
  Filled 2014-08-29: qty 20

## 2014-08-29 MED ORDER — LIDOCAINE HCL (PF) 1 % IJ SOLN
INTRAMUSCULAR | Status: AC
  Filled 2014-08-29: qty 30

## 2014-08-29 MED ORDER — LABETALOL HCL 5 MG/ML IV SOLN
INTRAVENOUS | Status: AC
  Filled 2014-08-29: qty 4

## 2014-08-29 MED ORDER — PHENYLEPHRINE 40 MCG/ML (10ML) SYRINGE FOR IV PUSH (FOR BLOOD PRESSURE SUPPORT)
PREFILLED_SYRINGE | INTRAVENOUS | Status: AC
  Filled 2014-08-29: qty 20

## 2014-08-29 MED ORDER — MORPHINE SULFATE 2 MG/ML IJ SOLN
2.0000 mg | INTRAMUSCULAR | Status: DC | PRN
  Administered 2014-08-29: 2 mg via INTRAVENOUS
  Filled 2014-08-29: qty 1

## 2014-08-29 MED ORDER — OXYCODONE HCL 5 MG PO TABS
5.0000 mg | ORAL_TABLET | ORAL | Status: DC | PRN
  Administered 2014-08-31: 5 mg via ORAL
  Filled 2014-08-29: qty 1

## 2014-08-29 MED ORDER — OXYCODONE HCL 5 MG PO TABS
5.0000 mg | ORAL_TABLET | Freq: Once | ORAL | Status: DC | PRN
Start: 2014-08-29 — End: 2014-08-29

## 2014-08-29 MED ORDER — SODIUM CHLORIDE 0.9 % IV SOLN
0.0125 ug/kg/min | INTRAVENOUS | Status: AC
  Administered 2014-08-29: .3 ug/kg/min via INTRAVENOUS
  Filled 2014-08-29: qty 2000

## 2014-08-29 MED ORDER — HEMOSTATIC AGENTS (NO CHARGE) OPTIME
TOPICAL | Status: DC | PRN
  Administered 2014-08-29: 1 via TOPICAL

## 2014-08-29 MED ORDER — PANTOPRAZOLE SODIUM 40 MG IV SOLR
40.0000 mg | INTRAVENOUS | Status: DC
  Administered 2014-08-29 – 2014-08-30 (×2): 40 mg via INTRAVENOUS
  Filled 2014-08-29 (×3): qty 40

## 2014-08-29 MED ORDER — NITROGLYCERIN 0.2 MG/ML ON CALL CATH LAB
INTRAVENOUS | Status: DC | PRN
  Administered 2014-08-29: 200 ug via INTRAVENOUS
  Administered 2014-08-29: 400 ug via INTRAVENOUS
  Administered 2014-08-29: 50 ug via INTRAVENOUS

## 2014-08-29 MED ORDER — PHENOL 1.4 % MT LIQD
1.0000 | OROMUCOSAL | Status: DC | PRN
  Filled 2014-08-29: qty 177

## 2014-08-29 MED ORDER — EPHEDRINE SULFATE 50 MG/ML IJ SOLN
INTRAMUSCULAR | Status: DC | PRN
  Administered 2014-08-29: 5 mg via INTRAVENOUS
  Administered 2014-08-29: 10 mg via INTRAVENOUS

## 2014-08-29 MED ORDER — SUFENTANIL CITRATE 50 MCG/ML IV SOLN
INTRAVENOUS | Status: DC | PRN
  Administered 2014-08-29: 10 ug via INTRAVENOUS

## 2014-08-29 MED ORDER — GLYCOPYRROLATE 0.2 MG/ML IJ SOLN
INTRAMUSCULAR | Status: AC
  Filled 2014-08-29: qty 3

## 2014-08-29 MED ORDER — BISACODYL 10 MG RE SUPP: 10.0000 mg | Freq: Every day | RECTAL | Status: DC | PRN

## 2014-08-29 MED ORDER — EPINEPHRINE HCL 0.1 MG/ML IJ SOSY
PREFILLED_SYRINGE | INTRAMUSCULAR | Status: DC | PRN
  Administered 2014-08-29: .9 mg via INTRAVENOUS
  Administered 2014-08-29: .1 mg via INTRAVENOUS

## 2014-08-29 MED ORDER — SUFENTANIL CITRATE 50 MCG/ML IV SOLN
INTRAVENOUS | Status: AC
  Filled 2014-08-29: qty 1

## 2014-08-29 MED ORDER — LABETALOL HCL 5 MG/ML IV SOLN
10.0000 mg | INTRAVENOUS | Status: AC | PRN
  Administered 2014-08-29 (×4): 10 mg via INTRAVENOUS
  Filled 2014-08-29 (×2): qty 4

## 2014-08-29 MED ORDER — ONDANSETRON HCL 4 MG/2ML IJ SOLN
INTRAMUSCULAR | Status: AC
  Filled 2014-08-29: qty 2

## 2014-08-29 MED ORDER — VASOPRESSIN 20 UNIT/ML IV SOLN
INTRAVENOUS | Status: DC | PRN
  Administered 2014-08-29: 2 [IU] via INTRAVENOUS

## 2014-08-29 MED ORDER — ENOXAPARIN SODIUM 30 MG/0.3ML ~~LOC~~ SOLN
30.0000 mg | SUBCUTANEOUS | Status: DC
  Filled 2014-08-29 (×2): qty 0.3

## 2014-08-29 MED ORDER — PHENYLEPHRINE HCL 10 MG/ML IJ SOLN
10.0000 mg | INTRAVENOUS | Status: DC | PRN
  Administered 2014-08-29: 50 ug/min via INTRAVENOUS

## 2014-08-29 MED ORDER — SODIUM CHLORIDE 0.9 % IV SOLN
INTRAVENOUS | Status: DC
  Administered 2014-08-29: 18:00:00 via INTRAVENOUS

## 2014-08-29 MED ORDER — SUCCINYLCHOLINE CHLORIDE 20 MG/ML IJ SOLN
INTRAMUSCULAR | Status: DC | PRN
  Administered 2014-08-29: 60 mg via INTRAVENOUS

## 2014-08-29 MED ORDER — LIDOCAINE HCL (CARDIAC) 20 MG/ML IV SOLN
INTRAVENOUS | Status: AC
  Filled 2014-08-29: qty 15

## 2014-08-29 MED ORDER — ESMOLOL HCL 10 MG/ML IV SOLN
INTRAVENOUS | Status: DC | PRN
  Administered 2014-08-29: 80 mg via INTRAVENOUS
  Administered 2014-08-29: 20 mg via INTRAVENOUS

## 2014-08-29 MED ORDER — ACETAMINOPHEN 325 MG PO TABS: 325.0000 mg | ORAL_TABLET | ORAL | Status: DC | PRN

## 2014-08-29 MED ORDER — FENTANYL CITRATE (PF) 100 MCG/2ML IJ SOLN
25.0000 ug | INTRAMUSCULAR | Status: DC | PRN
  Administered 2014-08-29 (×2): 25 ug via INTRAVENOUS

## 2014-08-29 MED ORDER — NITROGLYCERIN 0.2 MG/HR TD PT24
0.2000 mg | MEDICATED_PATCH | Freq: Every day | TRANSDERMAL | Status: DC
  Administered 2014-08-29 – 2014-08-31 (×3): 0.2 mg via TRANSDERMAL
  Filled 2014-08-29 (×4): qty 1

## 2014-08-29 MED ORDER — OXYCODONE HCL 5 MG/5ML PO SOLN: 5.0000 mg | Freq: Once | ORAL | Status: DC | PRN

## 2014-08-29 MED ORDER — PROPOFOL 10 MG/ML IV BOLUS
INTRAVENOUS | Status: DC | PRN
  Administered 2014-08-29 (×2): 50 mg via INTRAVENOUS
  Administered 2014-08-29: 2 mg via INTRAVENOUS
  Administered 2014-08-29: 30 mg via INTRAVENOUS

## 2014-08-29 MED ORDER — HYDRALAZINE HCL 20 MG/ML IJ SOLN: 5.0000 mg | INTRAMUSCULAR | Status: DC | PRN

## 2014-08-29 MED ORDER — CHLORHEXIDINE GLUCONATE CLOTH 2 % EX PADS: 6.0000 | MEDICATED_PAD | Freq: Once | CUTANEOUS | Status: DC

## 2014-08-29 MED ORDER — LIDOCAINE HCL (CARDIAC) 20 MG/ML IV SOLN
INTRAVENOUS | Status: DC | PRN
  Administered 2014-08-29: 100 mg via INTRAVENOUS
  Administered 2014-08-29: 80 mg via INTRAVENOUS

## 2014-08-29 MED ORDER — ONDANSETRON HCL 4 MG/2ML IJ SOLN
INTRAMUSCULAR | Status: DC | PRN
  Administered 2014-08-29: 4 mg via INTRAVENOUS

## 2014-08-29 MED ORDER — ONDANSETRON HCL 4 MG/2ML IJ SOLN: 4.0000 mg | Freq: Four times a day (QID) | INTRAMUSCULAR | Status: DC | PRN

## 2014-08-29 SURGICAL SUPPLY — 52 items
CANISTER SUCTION 2500CC (MISCELLANEOUS) ×3 IMPLANT
CATH ROBINSON RED A/P 18FR (CATHETERS) ×3 IMPLANT
CATH SUCT 10FR WHISTLE TIP (CATHETERS) ×3 IMPLANT
CLIP TI MEDIUM 6 (CLIP) ×3 IMPLANT
CLIP TI WIDE RED SMALL 6 (CLIP) ×3 IMPLANT
CRADLE DONUT ADULT HEAD (MISCELLANEOUS) ×3 IMPLANT
DRAIN CHANNEL 15F RND FF W/TCR (WOUND CARE) IMPLANT
DRAPE INCISE IOBAN 66X45 STRL (DRAPES) ×2 IMPLANT
DRAPE ORTHO SPLIT 77X108 STRL (DRAPES) ×3
DRAPE PROXIMA HALF (DRAPES) ×2 IMPLANT
DRAPE SURG ORHT 6 SPLT 77X108 (DRAPES) IMPLANT
ELECT REM PT RETURN 9FT ADLT (ELECTROSURGICAL) ×3
ELECTRODE REM PT RTRN 9FT ADLT (ELECTROSURGICAL) ×1 IMPLANT
EVACUATOR SILICONE 100CC (DRAIN) IMPLANT
GAUZE SPONGE 4X4 12PLY STRL (GAUZE/BANDAGES/DRESSINGS) ×3 IMPLANT
GLOVE BIO SURGEON STRL SZ 6.5 (GLOVE) ×1 IMPLANT
GLOVE BIO SURGEONS STRL SZ 6.5 (GLOVE) ×1
GLOVE BIOGEL PI IND STRL 6.5 (GLOVE) IMPLANT
GLOVE BIOGEL PI IND STRL 7.5 (GLOVE) ×1 IMPLANT
GLOVE BIOGEL PI INDICATOR 6.5 (GLOVE) ×2
GLOVE BIOGEL PI INDICATOR 7.5 (GLOVE) ×2
GLOVE SURG SS PI 7.5 STRL IVOR (GLOVE) ×3 IMPLANT
GOWN STRL REUS W/ TWL LRG LVL3 (GOWN DISPOSABLE) ×2 IMPLANT
GOWN STRL REUS W/ TWL XL LVL3 (GOWN DISPOSABLE) ×1 IMPLANT
GOWN STRL REUS W/TWL LRG LVL3 (GOWN DISPOSABLE) ×6
GOWN STRL REUS W/TWL XL LVL3 (GOWN DISPOSABLE) ×3
HEMOSTAT SNOW SURGICEL 2X4 (HEMOSTASIS) IMPLANT
INSERT FOGARTY SM (MISCELLANEOUS) IMPLANT
KIT BASIN OR (CUSTOM PROCEDURE TRAY) ×3 IMPLANT
KIT ROOM TURNOVER OR (KITS) ×3 IMPLANT
LIQUID BAND (GAUZE/BANDAGES/DRESSINGS) ×3 IMPLANT
NDL HYPO 25GX1X1/2 BEV (NEEDLE) IMPLANT
NEEDLE HYPO 25GX1X1/2 BEV (NEEDLE) ×3 IMPLANT
NS IRRIG 1000ML POUR BTL (IV SOLUTION) ×9 IMPLANT
PACK CAROTID (CUSTOM PROCEDURE TRAY) ×3 IMPLANT
PAD ARMBOARD 7.5X6 YLW CONV (MISCELLANEOUS) ×6 IMPLANT
SHUNT CAROTID BYPASS 10 (VASCULAR PRODUCTS) IMPLANT
SHUNT CAROTID BYPASS 12FRX15.5 (VASCULAR PRODUCTS) IMPLANT
SPONGE INTESTINAL PEANUT (DISPOSABLE) ×3 IMPLANT
SUT ETHILON 3 0 PS 1 (SUTURE) IMPLANT
SUT PROLENE 6 0 BV (SUTURE) ×3 IMPLANT
SUT PROLENE 7 0 BV 1 (SUTURE) IMPLANT
SUT SILK 3 0 TIES 17X18 (SUTURE)
SUT SILK 3-0 18XBRD TIE BLK (SUTURE) IMPLANT
SUT VIC AB 2-0 CT1 27 (SUTURE) ×3
SUT VIC AB 2-0 CT1 36 (SUTURE) ×2 IMPLANT
SUT VIC AB 2-0 CT1 TAPERPNT 27 (SUTURE) IMPLANT
SUT VIC AB 3-0 SH 27 (SUTURE) ×6
SUT VIC AB 3-0 SH 27X BRD (SUTURE) ×2 IMPLANT
SUT VICRYL 4-0 PS2 18IN ABS (SUTURE) ×3 IMPLANT
SYR CONTROL 10ML LL (SYRINGE) ×2 IMPLANT
WATER STERILE IRR 1000ML POUR (IV SOLUTION) ×3 IMPLANT

## 2014-08-29 NOTE — Progress Notes (Signed)
Appreciate Dr. Michaelle Copas assistance with this patient.  He required chest compression fro approximately 5 minutes when he lost a pulse at the beginning of his attempted carotid endarterectomy.  I terminated the operation after a strong pulse was regained.  I had not exposed the carotid artery, only ligated the facial vein.  The patient awoke from surgery without any deficits.  He was moving all 4 extremities to command i the PACU after being extubated in the OR.  He responded appropriately to all questions.  Etiology of the event is unknown. He is currently undergoing workup with serial lab checks and a 2-D echo pending.    I discussed with patient and partner that I would not plan on attempting CEA until the etiology of the event was determined.  Depending on when this occurs, I would not want to redo CEA for at least 3 months due to scar tissue from his operation today.  He will be followed by Dr. Imogene Burn on Friday until discharge as I am out of town.  Family understands.  Durene Cal

## 2014-08-29 NOTE — Anesthesia Preprocedure Evaluation (Signed)
Anesthesia Evaluation  Patient identified by MRN, date of birth, ID band Patient awake    Reviewed: Allergy & Precautions, NPO status , Patient's Chart, lab work & pertinent test results  History of Anesthesia Complications (+) DIFFICULT AIRWAY and history of anesthetic complications  Airway Mallampati: IV  TM Distance: >3 FB Neck ROM: Limited    Dental  (+) Teeth Intact   Pulmonary neg shortness of breath, neg sleep apnea, neg COPDneg recent URI, former smoker,  breath sounds clear to auscultation        Cardiovascular hypertension, Pt. on medications and Pt. on home beta blockers + CAD, + CABG, + Peripheral Vascular Disease and +CHF Rhythm:Regular + Systolic murmurs    Neuro/Psych  Neuromuscular disease CVA, No Residual Symptoms    GI/Hepatic negative GI ROS, Neg liver ROS,   Endo/Other  diabetes, Type 2  Renal/GU CRFRenal disease     Musculoskeletal   Abdominal   Peds  Hematology  (+) anemia ,   Anesthesia Other Findings   Reproductive/Obstetrics                             Anesthesia Physical Anesthesia Plan  ASA: III  Anesthesia Plan: General   Post-op Pain Management:    Induction: Intravenous  Airway Management Planned: Oral ETT  Additional Equipment: Arterial line  Intra-op Plan:   Post-operative Plan: Extubation in OR  Informed Consent: I have reviewed the patients History and Physical, chart, labs and discussed the procedure including the risks, benefits and alternatives for the proposed anesthesia with the patient or authorized representative who has indicated his/her understanding and acceptance.   Dental advisory given  Plan Discussed with: CRNA and Surgeon  Anesthesia Plan Comments:         Anesthesia Quick Evaluation

## 2014-08-29 NOTE — Interval H&P Note (Signed)
History and Physical Interval Note:  08/29/2014 2:07 PM  Kurt Richard  has presented today for surgery, with the diagnosis of Right carotid artery stenosis I65.21  The various methods of treatment have been discussed with the patient and family. After consideration of risks, benefits and other options for treatment, the patient has consented to  Procedure(s): ENDARTERECTOMY CAROTID (Right) as a surgical intervention .  The patient's history has been reviewed, patient examined, no change in status, stable for surgery.  I have reviewed the patient's chart and labs.  Questions were answered to the patient's satisfaction.     Durene Cal

## 2014-08-29 NOTE — Transfer of Care (Signed)
Immediate Anesthesia Transfer of Care Note  Patient: Kurt Richard  Procedure(s) Performed: Procedure(s): Attempted ENDARTERECTOMY CAROTID (Right)  Patient Location: PACU  Anesthesia Type:General  Level of Consciousness: awake, oriented and patient cooperative  Airway & Oxygen Therapy: Patient Spontanous Breathing and Patient connected to nasal cannula oxygen  Post-op Assessment: Report given to RN, Post -op Vital signs reviewed and stable and Patient moving all extremities  Post vital signs: Reviewed and stable  Last Vitals:  Filed Vitals:   08/29/14 1609  BP:   Pulse:   Temp: 36.4 C  Resp:     Complications: No apparent anesthesia complications

## 2014-08-29 NOTE — Consult Note (Addendum)
Name: Kurt Richard is a 69 y.o. male Admit date: 08/29/2014 Referring Physician:  Myra Gianotti, MD Primary Physician:  Evelena Peat, MD, M.D. Primary Cardiologist:  Leodis Sias, M.D.  Reason for Consultation:  Intraoperative cardiac event characterized by absent blood pressure/profound hypotension with stable heart rhythm  ASSESSMENT:  1. Cardiac event requiring CPR with pharmacologic therapy with chest compressions normal rhythm noted on monitor but absence of sustainable blood pressure. ?PEA The event lasted approximately 5 minutes. Etiology could include a vasodepressor baroreceptor response to carotid manipulation, volume depletion, critical AS, . Doubt PE, pericardial tamponade given post event course.  2. Coronary atherosclerotic heart disease with prior coronary artery bypass grafting, prior anterior myocardial infarction (late presentation), and rare angina 3. Chronic systolic heart failure due to ischemic cardiomyopathy with EF of 35% by most recent echo 2014 4. Stage IV chronic kidney disease 5. Right carotid disease, with fresh right carotid endarterectomy (within the past hour) 6. Diabetes mellitus, type II 7. Peripheral arterial disease 8. History of essential hypertension 9. Moderate aortic stenosis by echo 2014 10. Mild substernal chest discomfort but without evidence of acute ischemia on EKG 08/29/14  PLAN: 1. Patient should be recovered in the surgical ICU 2. Serial cardiac markers will be obtained 3. If blood pressure and clinical state will tolerate, we will add long-acting nitrates by patch 4. 2-D Doppler echocardiogram to reassess LV function and severity of aortic stenosis 5. No indication for acute coronary angiography as the patient is not having STEMI by EKG criteria and would be a great risk for development of acute kidney injury if exposed to contrast.   HPI: Called into the operating room by Dr. Myra Gianotti after this 69 year old gentleman developed profound  hypotension/EMD during his carotid endarterectomy. CPR including chest compressions and pharmacology was required for proximally 5 minutes. Upon my arrival the patient had a blood pressure over 200 systolic. He was in normal sinus rhythm on the monitor. According to anesthesia he never lost his heart rhythm during the cardiac event. There was no ventricular arrhythmia or bradycardia.  Now awake in the recovery room, the patient states that there is a mild sensation of tightness in his chest. An EKG has been done and repeated and neither reveal any evidence of an acute ischemic abnormality. He denies dyspnea. He has no neurological complaints.  PMH:   Past Medical History  Diagnosis Date  . Diabetes mellitus   . Hypertension   . Chronic systolic heart failure     echo 04/12/11: Mild LVH, EF 30-35%, mid to distal anteroseptal and apical hypokinesis, grade 2 diastolic dysfunction, moderate LAE, mild RAE.  [name] Hyperlipidemia   . Tobacco abuse   . CAD (coronary artery disease)     acute anterior STEMI, late presentation 04/06/11 LHC 3/5 demonstrated severe ostial left main 95% stenosis and otherwise three-vessel CAD with an ejection fraction of 15%.  Emergent CABG: Dr. Dorris Fetch - Grafts: LIMA-LAD, SVG-D1, SVG-OM1, SVG-PDA and PL.  . Ischemic cardiomyopathy   . DM2 (diabetes mellitus, type 2)   . 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 Anemia     Post-CABG  . Arthritis   . Stroke   . Neuromuscular disorder     diabetic neuropathy  . Diarrhea     has had c diff diarrhea  . Weakness   . Bruises easily   . PAD (peripheral artery disease) 07/28/2012    Angiography 08/2012: Right: 90% focal stenosis in external iliac artery, long occlusion of  SFA with 3 vessel run off . s/p slef expanding stent placement to external iliac artery. Left: borderline significant disease in comon and external iliac arteries. Diffuse 70-90% in SFA with 3 vessel run off.   . Bilateral carotid artery disease   .  Difficult intubation     PATIENT STATED HERNIATED 2 DISCS @[facility]  HAD TO HAVE CERVICAL SURGERY  . CHF (congestive heart failure)   . Acute renal insufficiency     05/2011 admission (cr 1.54 at discharge), WILL START HD IN [place]ALL     PSH:   Past Surgical History  Procedure Laterality Date  . Coronary artery bypass graft  04/06/2011    Procedure: CORONARY ARTERY BYPASS GRAFTING (CABG);  Surgeon: Loreli Slot, MD;  Location: [facility] OR;  Service: Open Heart Surgery;  Laterality: N/A;  Coronary Artery Bypass Graft times four utilizing the left internal mammary artery and the Right and left  greater Saphenous veins Harvested endoscopically.  . Fibroid bypass 04/06/11    . Colon surgery      rectal fissure  . Cervical disc surgery  1997 - approximate  . Colonoscopy  07/29/2011    Procedure: COLONOSCOPY;  Surgeon: Rachael Fee, MD;  Location: [facility] ENDOSCOPY;  Service: Endoscopy;  Laterality: N/A;  . Eye surgery  2011    cataract removal  . Left heart catheterization with coronary angiogram N/A 04/06/2011    Procedure: LEFT HEART CATHETERIZATION WITH CORONARY ANGIOGRAM;  Surgeon: Iran Ouch, MD;  Location: [facility] CATH LAB;  Service: Cardiovascular;  Laterality: N/A;  . Abdominal aortagram N/A 08/02/2012    Procedure: ABDOMINAL Ronny Flurry;  Surgeon: Iran Ouch, MD;  Location: [facility] CATH LAB;  Service: Cardiovascular;  Laterality: N/A;  . Percutaneous stent intervention Left 08/02/2012    Procedure: PERCUTANEOUS STENT INTERVENTION;  Surgeon: Iran Ouch, MD;  Location: [facility] CATH LAB;  Service: Cardiovascular;  Laterality: Left;  stent x1 to lt ext iliac artery  . Lower extremity angiogram Bilateral 08/02/2012    Procedure: LOWER EXTREMITY ANGIOGRAM;  Surgeon: Iran Ouch, MD;  Location: [facility] CATH LAB;  Service: Cardiovascular;  Laterality: Bilateral;  . Ercp      X2   Allergies:  Diltiazem hcl and Nifedipine Prior to Admit Meds:   Prescriptions prior to admission  Medication Sig Dispense Refill Last  Dose  . aspirin 81 MG EC tablet Take 1 tablet (81 mg total) by mouth daily.   Past Week at Unknown time  . atorvastatin (LIPITOR) 20 MG tablet TAKE 1 TABLET EVERY DAY 30 tablet 5 08/29/2014 at 0940  . calcitRIOL (ROCALTROL) 0.25 MCG capsule Take 0.25 mcg by mouth daily.    08/29/2014 at 0940  . carvedilol (COREG) 25 MG tablet TAKE 1 TABLET BY MOUTH TWICE A DAY WITH A MEAL 60 tablet 2 08/29/2014 at 0940  . clopidogrel (PLAVIX) 75 MG tablet TAKE 1 TABLET EVERY DAY 30 tablet 6 Past Week at Unknown time  . diphenoxylate-atropine (LOMOTIL) 2.5-0.025 MG per tablet TAKE 1 TABLET BY MOUTH 4 TIMES A DAY AS NEEDED FOR DIARRHEA 90 tablet 0 Past Week at Unknown time  . epoetin alfa (EPOGEN,PROCRIT) 3000 UNIT/ML injection Inject 3,000 Units into the skin once. Take on wednesdays   08/28/2014 at Unknown time  . fluticasone (FLONASE) 50 MCG/ACT nasal spray Place 2 sprays into the nose daily.   08/29/2014 at 0940  . furosemide (LASIX) 40 MG tablet Take 40 mg by mouth daily as needed for fluid.   08/28/2014 at Unknown time  .  hydrALAZINE (APRESOLINE) 25 MG tablet Take 1 tablet (25 mg total) by mouth 3 (three) times daily. 270 tablet 3 08/29/2014 at 0940  . HYDROcodone-acetaminophen (NORCO/VICODIN) 5-325 MG per tablet Take 1 tablet by mouth every 4 (four) hours as needed for severe pain.    Past Month at Unknown time  . isosorbide dinitrate (ISORDIL) 20 MG tablet Take 1 tablet (20 mg total) by mouth 3 (three) times daily. 270 tablet 3 08/29/2014 at 0940  . nitroGLYCERIN (NITROSTAT) 0.4 MG SL tablet Place 0.4 mg under the tongue every 5 (five) minutes as needed for chest pain.   Past Month at Unknown time  . Opium Tincture, Paregoric, 2 MG/5ML TINC TAKE 1 TEASPOONFUL BY MOUTH 4 TIMES A DAY AS NEEDED FOR DIARRHEA OR LOOSE STOOLS 120 mL 2 Past Week at Unknown time  . oxyCODONE (OXY IR/ROXICODONE) 5 MG immediate release tablet Take 1 tablet (5 mg total) by mouth as needed. Per Dr. Georgann Housekeeper 40 tablet 0 Taking  . oxycodone (OXY-IR) 5  MG capsule Take 5 mg by mouth every 4 (four) hours as needed for pain.   Past Month at Unknown time  . sodium bicarbonate 650 MG tablet Take 1,300 mg by mouth 2 (two) times daily.  12 08/29/2014 at 0940  . furosemide (LASIX) 40 MG tablet TAKE ONE TABLET DAILY AND MAY TAKE ONE EXTRA AS NEEDED (Patient not taking: Reported on 08/21/2014) 110 tablet 3 Not Taking at Unknown time  . glucose blood (ONETOUCH VERIO) test strip Use as instructed. DX: 250.00 100 each 3 Taking  . ONETOUCH DELICA LANCETS FINE MISC Use as instructed. DX: 250.00 100 each 3 Taking   Fam HX:    Family History  Problem Relation Age of Onset  . Heart attack Mother     ?60s  . Diabetes Mother   . Heart disease Mother   . Hyperlipidemia Mother   . Hypertension Mother   . Peripheral vascular disease Mother   . Heart attack Sister     32s  . Deep vein thrombosis Sister   . Diabetes Sister     Dietitian  . Heart disease Sister   . Hyperlipidemia Sister   . Hypertension Sister   . Congestive Heart Failure Father   . Heart disease Father   . Diabetes Brother   . Heart disease Brother   . Hyperlipidemia Brother    Social HX:    History   Social History  . Marital Status: Single    Spouse Name: N/A  . Number of Children: 0  . Years of Education: N/A   Occupational History  . Not on file.   Social History Main Topics  . Smoking status: Former Smoker -- 0.50 packs/day    Types: Cigarettes    Quit date: 04/06/2011  . Smokeless tobacco: Never Used  . Alcohol Use: No  . Drug Use: No  . Sexual Activity: No   Other Topics Concern  . Not on file   Social History Narrative     Review of Systems: He complains of chronic dyspnea on exertion, he uses nitroglycerin when he becomes "congested". When he ambulates there is claudication. He had a recent lower extremity percutaneous procedure by Dr.Arida.   Physical Exam: Blood pressure 182/68, pulse 75, temperature 97.5 F (36.4 C), temperature source Oral, resp. rate  20, height 5\' 8"  (1.727 m), weight 91.627 kg (202 lb), SpO2 94 %. Weight change:    The patient is alert and oriented. There is a fresh right cervical  incision from carotid endarterectomy surgery. Neck exam reveals no obvious left jugular vein distention is noted. Cardiac exam reveals soft gallop is heard on cardiac auscultation at the apex. A 2 to 3/6 crescendo decrescendo systolic murmur is heard at the right upper sternal border. Abdomen is soft Neurological exam was without acute focal abnormalities Labs: Lab Results  Component Value Date   WBC 10.0 08/21/2014   HGB 11.6* 08/29/2014   HCT 34.0* 08/29/2014   MCV 94.0 08/21/2014   PLT 221 08/21/2014    Recent Labs Lab 08/29/14 1127  NA 144  K 4.8  CL 110  BUN 72*  CREATININE 3.90*  GLUCOSE 115*   No results found for: PTT Lab Results  Component Value Date   INR 1.14 08/21/2014   INR 1.08 08/01/2012   INR 1.31 05/06/2011   Lab Results  Component Value Date   CKTOTAL 1253* 04/07/2011   CKMB 67.6* 04/07/2011   TROPONINI >25.00* 04/07/2011     Lab Results  Component Value Date   CHOL 117 04/12/2014   CHOL 234* 10/17/2013   CHOL 220* 10/10/2012   Lab Results  Component Value Date   HDL 27.60* 04/12/2014   HDL 25.30* 10/17/2013   HDL 31.20* 10/10/2012   Lab Results  Component Value Date   LDLCALC 69 04/12/2014   LDLCALC 171* 10/17/2013   LDLCALC 33 08/06/2011   Lab Results  Component Value Date   TRIG 101.0 04/12/2014   TRIG 191.0* 10/17/2013   TRIG 142.0 10/10/2012   Lab Results  Component Value Date   CHOLHDL 4 04/12/2014   CHOLHDL 9 10/17/2013   CHOLHDL 7 10/10/2012   Lab Results  Component Value Date   LDLDIRECT 155.3 10/10/2012      Radiology:  No results found.  EKG:  Normal sinus rhythm, T-wave inversion 1 and aVL, prominent voltage, interventricular conduction delay. When compared to 2015, no significant changes noted.    Lesleigh Noe 08/29/2014 4:54 PM

## 2014-08-29 NOTE — Op Note (Signed)
    Patient name: Kurt Richard MRN: [id number] DOB: Jan 23, 1946 Sex: male  08/29/2014 Pre-operative Diagnosis: Asymptomatic right carotid stenosis Post-operative diagnosis:  Same Surgeon:  Durene Cal Assistants:  Doreatha Massed Procedure:    1. right carotid artery exposure.   2.  Ligation of facial vein Anesthesia:  Gen. Blood Loss:  See anesthesia record Specimens:  None  Findings:  During exposure of the carotid artery, the patient developed hypotension.  He received chest compressions for approximately 5 minutes.    Indications:  The patient has been followed for carotid stenosis bilaterally.  The right side was recently found to be greater than 80%.  He is asymptomatic.  He is here today for revascularization.  Procedure:  The patient was identified in the holding area and taken to [facility] OR ROOM 16  The patient was then placed supine on the table. general anesthesia was administered.  The patient was prepped and draped in the usual sterile fashion.  A time out was called and antibiotics were administered.  A incision was made along the border of the right sternocleidomastoid.  Cautery was used to divide subcutaneous tissue.  The platysma muscle was divided with cautery.  I dissected out the medial border of the internal jugular vein and isolated the right facial vein.  The right facial vein was then divided between 2-0 silk ties and metal clips.  About this time, I was unable to feel a pulse within the carotid artery.  The arterial waveform had dampened.  The patient was getting medications which did not appear to be circulating.  Therefore chest compressions were initiated.  This was done for approximately 4-5 minutes.  After this, the medications had circulated and the patient's blood pressure returned.  I did not feel comfortable proceeding with the operation.  Hemostasis was achieved.  The incision was closed with a layer of 20 and a layer of 30 Vicryls, followed by 4-0 Vicryls and the  skin and Dermabond.  The patient was then awakened from anesthesia.  He was found to be responsive to commands and moving all extremities.   Disposition:  To PACU in stable condition.   Juleen China, M.D. [facility] Office: [phone] Pager:  [phone]

## 2014-08-29 NOTE — Anesthesia Procedure Notes (Signed)
Procedure Name: Intubation Date/Time: 08/29/2014 2:58 PM Performed by: Charm Barges, Odaliz Mcqueary R Pre-anesthesia Checklist: Patient identified, Emergency Drugs available, Suction available, Patient being monitored and Timeout performed Patient Re-evaluated:Patient Re-evaluated prior to inductionOxygen Delivery Method: Circle system utilized Preoxygenation: Pre-oxygenation with 100% oxygen Intubation Type: IV induction Ventilation: Mask ventilation without difficulty Laryngoscope Size: Glidescope Grade View: Grade II Tube type: Oral Tube size: 8.0 mm Number of attempts: 1 Airway Equipment and Method: Rigid stylet and Video-laryngoscopy Placement Confirmation: ETT inserted through vocal cords under direct vision,  positive ETCO2 and breath sounds checked- equal and bilateral Secured at: 24 cm Tube secured with: Tape Dental Injury: Teeth and Oropharynx as per pre-operative assessment  Difficulty Due To: Difficulty was anticipated, Difficult Airway- due to reduced neck mobility, Difficult Airway- due to anterior larynx and Difficult Airway- due to large tongue Future Recommendations: Recommend- induction with short-acting agent, and alternative techniques readily available

## 2014-08-29 NOTE — H&P (View-Only) (Signed)
Patient name: Kurt Richard MRN: [id number] DOB: 1945-11-28 Sex: male   Referred by: Dr Allyson Sabal  Reason for referral:  Chief Complaint  Patient presents with  . New Evaluation    carotid stenosis  referred by Dr Allyson Sabal  lab study prior    HISTORY OF PRESENT ILLNESS: This is a 69 year old gentleman who is referred today for carotid stenosis.  He has been followed with ultrasound and now has greater than 80% right carotid stenosis.  He is asymptomatic.  Specifically, he denies numbness or weakness in either extremity.  He denies slurred speech.  He denies amaurosis fugax.  The patient suffers from hypercholesterolemia which is managed with a statin.  He is a diet-controlled diabetic with hemoglobin A1c is in the 6 range.  He has stage IV renal insufficiency and is being evaluated for dialysis in the next month.  He takes injections for anemia.  He is a former smoker having quit 3 years ago.  He suffers from peripheral vascular disease and has undergone stenting in his left lower extremity 2 years ago.  He is on Plavix for this.  He also has coronary artery disease.  He has never had a heart attack but did have CABG in 2013.  He states that around the time of his CABG he had a CT scan for dysarthria which showed a chronic right PCA infarct.  The patient has chronic diarrhea which is very severe.  Past Medical History  Diagnosis Date  . Diabetes mellitus   . Hypertension   . Chronic systolic heart failure     echo 04/12/11: Mild LVH, EF 30-35%, mid to distal anteroseptal and apical hypokinesis, grade 2 diastolic dysfunction, moderate LAE, mild RAE.  [name] Hyperlipidemia   . Tobacco abuse   . CAD (coronary artery disease)     acute anterior STEMI, late presentation 04/06/11 LHC 3/5 demonstrated severe ostial left main 95% stenosis and otherwise three-vessel CAD with an ejection fraction of 15%.  Emergent CABG: Dr. Dorris Fetch - Grafts: LIMA-LAD, SVG-D1, SVG-OM1, SVG-PDA and PL.  . Ischemic  cardiomyopathy   . DM2 (diabetes mellitus, type 2)   . 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 Acute renal insufficiency     05/2011 admission (cr 1.54 at discharge)  . Anemia     Post-CABG  . Arthritis   . Stroke   . Sleep apnea   . Neuromuscular disorder     diabetic neuropathy  . Diarrhea     has had c diff diarrhea  . Weakness   . Bruises easily   . PAD (peripheral artery disease) 07/28/2012    Angiography 08/2012: Right: 90% focal stenosis in external iliac artery, long occlusion of SFA with 3 vessel run off . s/p slef expanding stent placement to external iliac artery. Left: borderline significant disease in comon and external iliac arteries. Diffuse 70-90% in SFA with 3 vessel run off.   . Bilateral carotid artery disease     Past Surgical History  Procedure Laterality Date  . Coronary artery bypass graft  04/06/2011    Procedure: CORONARY ARTERY BYPASS GRAFTING (CABG);  Surgeon: Loreli Slot, MD;  Location: [facility] OR;  Service: Open Heart Surgery;  Laterality: N/A;  Coronary Artery Bypass Graft times four utilizing the left internal mammary artery and the Right and left  greater Saphenous veins Harvested endoscopically.  . Fibroid bypass 04/06/11    . Colon surgery      rectal fissure  .  Cervical disc surgery  1997 - approximate  . Colonoscopy  07/29/2011    Procedure: COLONOSCOPY;  Surgeon: Rachael Feeaniel P Jacobs, MD;  Location: [facility] ENDOSCOPY;  Service: Endoscopy;  Laterality: N/A;  . Eye surgery  2011    cataract removal  . Left heart catheterization with coronary angiogram N/A 04/06/2011    Procedure: LEFT HEART CATHETERIZATION WITH CORONARY ANGIOGRAM;  Surgeon: Iran OuchMuhammad A Arida, MD;  Location: [facility] CATH LAB;  Service: Cardiovascular;  Laterality: N/A;  . Abdominal aortagram N/A 08/02/2012    Procedure: ABDOMINAL Ronny FlurryAORTAGRAM;  Surgeon: Iran OuchMuhammad A Arida, MD;  Location: [facility] CATH LAB;  Service: Cardiovascular;  Laterality: N/A;  . Percutaneous stent intervention Left 08/02/2012     Procedure: PERCUTANEOUS STENT INTERVENTION;  Surgeon: Iran OuchMuhammad A Arida, MD;  Location: [facility] CATH LAB;  Service: Cardiovascular;  Laterality: Left;  stent x1 to lt ext iliac artery  . Lower extremity angiogram Bilateral 08/02/2012    Procedure: LOWER EXTREMITY ANGIOGRAM;  Surgeon: Iran OuchMuhammad A Arida, MD;  Location: [facility] CATH LAB;  Service: Cardiovascular;  Laterality: Bilateral;    History   Social History  . Marital Status: Single    Spouse Name: N/A  . Number of Children: 0  . Years of Education: N/A   Occupational History  . Not on file.   Social History Main Topics  . Smoking status: Former Smoker -- 0.50 packs/day    Types: Cigarettes    Quit date: 04/06/2011  . Smokeless tobacco: Never Used  . Alcohol Use: No  . Drug Use: No  . Sexual Activity: No   Other Topics Concern  . Not on file   Social History Narrative    Family History  Problem Relation Age of Onset  . Heart attack Mother     ?3750s  . Diabetes Mother   . Heart disease Mother   . Hyperlipidemia Mother   . Hypertension Mother   . Peripheral vascular disease Mother   . Heart attack Sister     4740s  . Deep vein thrombosis Sister   . Diabetes Sister     [place]iet[place]  . Heart disease Sister   . Hyperlipidemia Sister   . Hypertension Sister   . Congestive Heart Failure Father   . Heart disease Father   . Diabetes Brother   . Heart disease Brother   . Hyperlipidemia Brother     Allergies as of 08/16/2014 - Review Complete 08/16/2014  Allergen Reaction Noted  . Diltiazem hcl Hives and Swelling 04/06/2011  . Nifedipine Hives and Swelling 04/06/2011    Current Outpatient Prescriptions on File Prior to Visit  Medication Sig Dispense Refill  . aspirin 81 MG EC tablet Take 1 tablet (81 mg total) by mouth daily.    [name]. atorvastatin (LIPITOR) 20 MG tablet TAKE 1 TABLET EVERY DAY 30 tablet 5  . calcitRIOL (ROCALTROL) 0.25 MCG capsule Take 0.25 mcg by mouth daily.     . carvedilol (COREG) 25 MG tablet TAKE 1 TABLET BY  MOUTH TWICE A DAY WITH A MEAL 60 tablet 2  . clopidogrel (PLAVIX) 75 MG tablet TAKE 1 TABLET EVERY DAY 30 tablet 6  . diphenoxylate-atropine (LOMOTIL) 2.5-0.025 MG per tablet TAKE 1 TABLET 4 TIMES A DAY AS NEEDED FOR DIARRHEA 90 tablet 0  . epoetin alfa (EPOGEN,PROCRIT) 3000 UNIT/ML injection Inject 3,000 Units into the skin once.    . fluticasone (FLONASE) 50 MCG/ACT nasal spray Place 2 sprays into the nose daily.    . furosemide (LASIX) 40 MG tablet TAKE ONE  TABLET DAILY AND MAY TAKE ONE EXTRA AS NEEDED 110 tablet 3  . glucose blood (ONETOUCH VERIO) test strip Use as instructed. DX: 250.00 100 each 3  . hydrALAZINE (APRESOLINE) 25 MG tablet Take 1 tablet (25 mg total) by mouth 3 (three) times daily. 270 tablet 3  . HYDROcodone-acetaminophen (NORCO/VICODIN) 5-325 MG per tablet Take 1 tablet by mouth every 4 (four) hours as needed for severe pain.     . isosorbide dinitrate (ISORDIL) 20 MG tablet Take 1 tablet (20 mg total) by mouth 3 (three) times daily. 270 tablet 3  . nitroGLYCERIN (NITROSTAT) 0.4 MG SL tablet Place 0.4 mg under the tongue every 5 (five) minutes as needed for chest pain.    Letta Pate DELICA LANCETS FINE MISC Use as instructed. DX: 250.00 100 each 3  . Opium Tincture, Paregoric, 2 MG/5ML TINC TAKE 1 TEASPOONFUL BY MOUTH 4 TIMES A DAY AS NEEDED FOR DIARRHEA OR LOOSE STOOLS 120 mL 2  . oxyCODONE (OXY IR/ROXICODONE) 5 MG immediate release tablet Take 1 tablet (5 mg total) by mouth as needed. Per Dr. Georgann Housekeeper 40 tablet 0  . sodium bicarbonate 650 MG tablet Take 1,300 mg by mouth 2 (two) times daily.  12   No current facility-administered medications on file prior to visit.     REVIEW OF SYSTEMS: Cardiovascular: No chest pain, chest pressure, palpitations, orthopnea, or dyspnea on exertion.  Positive for leg pain.  History of phlebitis. Pulmonary: No productive cough, asthma or wheezing. Neurologic: No weakness, paresthesias, aphasia, or amaurosis. No dizziness. Hematologic: No  bleeding problems or clotting disorders. Musculoskeletal: No joint pain or joint swelling. Gastrointestinal: No blood in stool or hematemesis Genitourinary: No dysuria or hematuria. Psychiatric:: No history of major depression. Integumentary: No rashes or ulcers. Constitutional: No fever or chills.  PHYSICAL EXAMINATION:  Filed Vitals:   08/16/14 0908 08/16/14 0913 08/16/14 0914  BP: 173/91 175/82 199/78  Pulse: 77 76 68  Resp: 16    Height: 5' 8.5" (1.74 m)    Weight: 202 lb (91.627 kg)     Body mass index is 30.26 kg/(m^2). General: The patient appears their stated age.   HEENT:  No gross abnormalities Pulmonary: Respirations are non-labored Abdomen: Soft and non-tender  Musculoskeletal: There are no major deformities.   Neurologic: No focal weakness or paresthesias are detected, Skin: There are no ulcer or rashes noted. Psychiatric: The patient has normal affect. Cardiovascular: There is a regular rate and rhythm without significant murmur appreciated.  No carotid bruits.  Palpable radial pulses bilaterally.  Diagnostic Studies: I have reviewed his ultrasound studies which the right side was repeated in our office.  The right side is greater than 80%.  The left side is 60-79%    Assessment:  Asymptomatic right carotid stenosis. Plan: I reviewed the treatment options with the patient which include medical management, carotid endarterectomy, and carotid stenting.  Because of his renal disease, I do not feel he is a candidate for CREST II or carotid stenting.  We have therefore decided to proceed with right carotid endarterectomy.  I discussed the risks and benefits of the operation including the risk of stroke, nerve injury, and cardioplegic complications.  All of his questions have been answered.  I will stop his Plavix 5 days prior to his operation which is been scheduled for Thursday, July 28     V. Charlena Cross, M.D. [facility]  [place] Office: [phone] Pager:  [phone]

## 2014-08-30 ENCOUNTER — Encounter (HOSPITAL_COMMUNITY): Payer: Self-pay | Admitting: Surgery

## 2014-08-30 ENCOUNTER — Ambulatory Visit (HOSPITAL_COMMUNITY): Payer: Medicare Other

## 2014-08-30 DIAGNOSIS — I350 Nonrheumatic aortic (valve) stenosis: Secondary | ICD-10-CM

## 2014-08-30 DIAGNOSIS — I10 Essential (primary) hypertension: Secondary | ICD-10-CM

## 2014-08-30 LAB — CBC
HCT: 30.8 % — ABNORMAL LOW (ref 39.0–52.0)
HEMOGLOBIN: 10.3 g/dL — AB (ref 13.0–17.0)
MCH: 31.7 pg (ref 26.0–34.0)
MCHC: 33.4 g/dL (ref 30.0–36.0)
MCV: 94.8 fL (ref 78.0–100.0)
PLATELETS: 166 10*3/uL (ref 150–400)
RBC: 3.25 MIL/uL — ABNORMAL LOW (ref 4.22–5.81)
RDW: 14.5 % (ref 11.5–15.5)
WBC: 7.9 10*3/uL (ref 4.0–10.5)

## 2014-08-30 LAB — TROPONIN I: Troponin I: 0.2 ng/mL — ABNORMAL HIGH (ref <0.031)

## 2014-08-30 LAB — BASIC METABOLIC PANEL
Anion gap: 8 (ref 5–15)
BUN: 62 mg/dL — AB (ref 6–20)
CALCIUM: 8.1 mg/dL — AB (ref 8.9–10.3)
CO2: 19 mmol/L — AB (ref 22–32)
Chloride: 112 mmol/L — ABNORMAL HIGH (ref 101–111)
Creatinine, Ser: 3.54 mg/dL — ABNORMAL HIGH (ref 0.61–1.24)
GFR calc non Af Amer: 16 mL/min — ABNORMAL LOW (ref >60)
GFR, EST AFRICAN AMERICAN: 19 mL/min — AB (ref >60)
GLUCOSE: 80 mg/dL (ref 65–99)
POTASSIUM: 4.5 mmol/L (ref 3.5–5.1)
Sodium: 139 mmol/L (ref 135–145)

## 2014-08-30 MED ORDER — ASPIRIN EC 81 MG PO TBEC
81.0000 mg | DELAYED_RELEASE_TABLET | Freq: Every day | ORAL | Status: DC
  Administered 2014-08-30 – 2014-08-31 (×2): 81 mg via ORAL
  Filled 2014-08-30 (×3): qty 1

## 2014-08-30 MED ORDER — PERFLUTREN LIPID MICROSPHERE
1.0000 mL | INTRAVENOUS | Status: AC | PRN
  Administered 2014-08-30: 3 mL via INTRAVENOUS
  Filled 2014-08-30: qty 10

## 2014-08-30 MED ORDER — LABETALOL HCL 5 MG/ML IV SOLN
10.0000 mg | INTRAVENOUS | Status: DC | PRN
  Administered 2014-08-31: 10 mg via INTRAVENOUS
  Filled 2014-08-30: qty 4

## 2014-08-30 MED ORDER — ATORVASTATIN CALCIUM 20 MG PO TABS
20.0000 mg | ORAL_TABLET | Freq: Every day | ORAL | Status: DC
Start: 2014-08-30 — End: 2014-09-01
  Administered 2014-08-30 – 2014-08-31 (×2): 20 mg via ORAL
  Filled 2014-08-30 (×3): qty 1

## 2014-08-30 MED ORDER — HYDROCODONE-ACETAMINOPHEN 5-325 MG PO TABS
1.0000 | ORAL_TABLET | ORAL | Status: DC | PRN
Start: 2014-08-30 — End: 2014-09-01
  Administered 2014-08-30: 1 via ORAL
  Filled 2014-08-30: qty 1

## 2014-08-30 MED ORDER — FLUTICASONE PROPIONATE 50 MCG/ACT NA SUSP
2.0000 | Freq: Every day | NASAL | Status: DC
  Administered 2014-08-30 – 2014-08-31 (×2): 2 via NASAL
  Filled 2014-08-30: qty 16

## 2014-08-30 MED ORDER — CLOPIDOGREL BISULFATE 75 MG PO TABS
75.0000 mg | ORAL_TABLET | Freq: Every day | ORAL | Status: DC
  Administered 2014-08-30 – 2014-08-31 (×2): 75 mg via ORAL
  Filled 2014-08-30 (×3): qty 1

## 2014-08-30 MED ORDER — HEPARIN SODIUM (PORCINE) 5000 UNIT/ML IJ SOLN
5000.0000 [IU] | Freq: Three times a day (TID) | INTRAMUSCULAR | Status: DC
  Administered 2014-08-30 – 2014-09-01 (×5): 5000 [IU] via SUBCUTANEOUS
  Filled 2014-08-30 (×8): qty 1

## 2014-08-30 MED ORDER — ASPIRIN EC 81 MG PO TBEC: 81.0000 mg | DELAYED_RELEASE_TABLET | Freq: Every day | ORAL | Status: DC

## 2014-08-30 NOTE — Progress Notes (Signed)
Echocardiogram 2D Echocardiogram with Definity has been performed.  Kurt Richard 08/30/2014, 3:36 PM

## 2014-08-30 NOTE — Anesthesia Postprocedure Evaluation (Signed)
  Anesthesia Post-op Note  Patient: Kurt Richard  Procedure(s) Performed: Procedure(s): Attempted ENDARTERECTOMY CAROTID (Right)  Patient Location: PACU  Anesthesia Type:General  Level of Consciousness: awake, alert , oriented and patient cooperative  Airway and Oxygen Therapy: Patient Spontanous Breathing and Patient connected to nasal cannula oxygen  Post-op Pain: mild  Post-op Assessment: Post-op Vital signs reviewed, Patient's Cardiovascular Status Stable, Respiratory Function Stable, Patent Airway, No signs of Nausea or vomiting and Pain level controlled              Post-op Vital Signs: Reviewed and stable  Last Vitals:  Filed Vitals:   08/30/14 1400  BP: 141/67  Pulse: 73  Temp:   Resp: 13    Complications: PEA arrest
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
## 2014-08-30 NOTE — Progress Notes (Addendum)
  Progress Note    08/30/2014 7:33 AM 1 Day Post-Op  Subjective:  Awake; c/o sore throat; feeling depressed  Afebrile HR 70's NSR 140's-170's systolic 96% 4LO2NC  Filed Vitals:   08/30/14 0700  BP: 141/69  Pulse: 74  Temp:   Resp: 17    Physical Exam: Cardiac:  Regular Lungs:  Non labored Incisions:  Clean and dry Extremities:  5/5 BUE BLE Neuro:  In tact  CBC    Component Value Date/Time   WBC 10.3 08/29/2014 1645   RBC 3.32* 08/29/2014 1645   HGB 10.3* 08/29/2014 1645   HCT 31.1* 08/29/2014 1645   PLT 182 08/29/2014 1645   MCV 93.7 08/29/2014 1645   MCH 31.0 08/29/2014 1645   MCHC 33.1 08/29/2014 1645   RDW 14.6 08/29/2014 1645   LYMPHSABS 0.7 06/18/2014 1126   MONOABS 0.7 06/18/2014 1126   EOSABS 0.4 06/18/2014 1126   BASOSABS 0.0 06/18/2014 1126    BMET    Component Value Date/Time   NA 142 08/29/2014 1645   K 4.4 08/29/2014 1645   CL 112* 08/29/2014 1645   CO2 20* 08/29/2014 1645   GLUCOSE 166* 08/29/2014 1645   BUN 70* 08/29/2014 1645   CREATININE 3.49* 08/29/2014 1645   CALCIUM 8.8* 08/29/2014 1645   GFRNONAA 17* 08/29/2014 1645   GFRAA 19* 08/29/2014 1645    INR    Component Value Date/Time   INR 1.24 08/29/2014 1645     Intake/Output Summary (Last 24 hours) at 08/30/14 0733 Last data filed at 08/30/14 0700  Gross per 24 hour  Intake 1880.83 ml  Output    350 ml  Net 1530.83 ml     Assessment:  69 y.o. male is s/p:  1. right carotid artery exposure. 2. Ligation of facial vein  With hypotension and loss of pulse requiring ~ 5 minutes of CPR 1 Day Post-Op  Plan: -pt awake and neurologically intact this morning -d/c IVF  -Plavix and aspirin restarted this morning -get CBC/BMP this morning -cardiac workup in progress-for echo today to evaluate AS -bump in troponin - no changes in rhythm and very well could be from chest compressions -will consult renal - Dr. Hyman Hopes follows pt -DVT prophylaxis:  SQ heparin -dc aline    -carotid endarterectomy aborted after events-will pursue this in a couple of months and after the pt has had a full cardiac workup.   Doreatha Massed, PA-C [facility] [phone] 08/30/2014 7:33 AM   Addendum  I have independently interviewed and examined the patient, and I agree with the physician assistant's findings.  Intraop PEA lead to CPR and abort of R CEA.  Cardiac work-up in progress. Echo today.  If stable over next 24 hour and cardiac cath not needed, maybe home tomorrow.  Leonides Sake, MD [facility] [place] Office: [phone] Pager: [phone]  08/30/2014, 8:00 AM

## 2014-08-30 NOTE — Plan of Care (Signed)
Problem: Phase II Discharge Progression Outcomes Goal: Tolerating diet Outcome: Progressing Renal Diet fact/handout sheets regarding high, medium and low potassium foods given to patient.

## 2014-08-30 NOTE — Progress Notes (Signed)
SUBJECTIVE:  Has neck pain and some soreness from chest compressions.  OBJECTIVE:   Vitals:   Filed Vitals:   08/30/14 1100 08/30/14 1147 08/30/14 1200 08/30/14 1300  BP: 134/64  139/61 140/56  Pulse: 72  73 73  Temp:  98.2 F (36.8 C)    TempSrc:  Oral    Resp: Height:      Weight:      SpO2: 96%  100% 96%   I&O's:   Intake/Output Summary (Last 24 hours) at 08/30/14 1325 Last data filed at 08/30/14 1200  Gross per 24 hour  Intake 2040.83 ml  Output    650 ml  Net 1390.83 ml   TELEMETRY: Reviewed telemetry pt in NSR:     PHYSICAL EXAM General: Well developed, well nourished, in no acute distress Head:   Normal cephalic and atramatic  Lungs:   Clear bilaterally to auscultation. Heart:   HRRR S1 S2  No JVD.   Abdomen: abdomen soft and non-tender Msk:  Back normal,  Normal strength and tone for age. Extremities:   No edema.  Neck would healing Neuro: Alert and oriented. Psych:  Normal affect, responds appropriately Skin: No rash   LABS: Basic Metabolic Panel:  Recent Labs  16/10/96 1645 08/30/14 0730  NA 142 139  K 4.4 4.5  CL 112* 112*  CO2 20* 19*  GLUCOSE 166* 80  BUN 70* 62*  CREATININE 3.49* 3.54*  CALCIUM 8.8* 8.1*   Liver Function Tests:  Recent Labs  08/29/14 1645  AST 56*  ALT 57  ALKPHOS 87  BILITOT 0.8  PROT 6.0*  ALBUMIN 3.3*   No results for input(s): LIPASE, AMYLASE in the last 72 hours. CBC:  Recent Labs  08/29/14 1645 08/30/14 0730  WBC 10.3 7.9  HGB 10.3* 10.3*  HCT 31.1* 30.8*  MCV 93.7 94.8  PLT 182 166   Cardiac Enzymes:  Recent Labs  08/29/14 1645 08/29/14 2230 08/30/14 0420  TROPONINI 0.06* 0.22* 0.20*   BNP: Invalid input(s): POCBNP D-Dimer: No results for input(s): DDIMER in the last 72 hours. Hemoglobin A1C: No results for input(s): HGBA1C in the last 72 hours. Fasting Lipid Panel: No results for input(s): CHOL, HDL, LDLCALC, TRIG, CHOLHDL, LDLDIRECT in the last 72 hours. Thyroid  Function Tests: No results for input(s): TSH, T4TOTAL, T3FREE, THYROIDAB in the last 72 hours.  Invalid input(s): FREET3 Anemia Panel: No results for input(s): VITAMINB12, FOLATE, FERRITIN, TIBC, IRON, RETICCTPCT in the last 72 hours. Coag Panel:   Lab Results  Component Value Date   INR 1.24 08/29/2014   INR 1.14 08/21/2014   INR 1.08 08/01/2012    RADIOLOGY: Dg Chest Port 1 View  08/29/2014   CLINICAL DATA:  Go status post chest compressions, evaluate for broken ribs  EXAM: PORTABLE CHEST - 1 VIEW  COMPARISON:  06/02/2011  FINDINGS: Mild cardiac enlargement allowing for technique status post prior CABG. No pneumothorax. Mild left lower lobe consolidation possibly atelectasis similar to prior study. Vascular congestion with mild diffuse interstitial prominence. Bony thorax grossly intact without evidence of rib fracture on this single AP view.  IMPRESSION: Limited evaluation of the rib cage shows no fracture.  Evidence of mild pulmonary edema.  Persistent left lower lobe consolidation as seen on prior studies.   Electronically Signed   By: Esperanza Heir M.D.   On: 08/29/2014 16:57      ASSESSMENT: PEA arrest perioperatively  PLAN:  Await echo result.    HTN: prn  labetolol for systolic hypertension.  May have been a combination of baroreceptor response with anesthesia that caused the event.  Low level positive troponins that are more likely from CRI than ACS.   Corky Crafts, MD  08/30/2014  1:25 PM

## 2014-08-30 NOTE — Consult Note (Signed)
Reason for Consult: Chronic kidney disease stage IV Referring Physician: Dr. Bridgett Larsson ([facility])  Kurt Richard is an 69 y.o. male.  HPI: Pt is a 69 y/o male w/ PMHx of HLD, CKD stage IV, DM, CAD, and HTN who presented for rt carotid endarterectomy with [facility] due to 80% rt carotid stenosis. During procedure pt had a PEA arrest w/ 5 minutes of CPR. The procedure was aborted, the rt carotid was never reached however his rt facial vein was ligated. Cardiology is following and recommend following troponins which have peaked at 0.22 >0.20 and ECHO. Pt states he was in his usual state of health prior to surgery. He endorses good po intake. Denies n/v, diarrhea, fevers, and abd pain. He follows w/ Dr. Justin Mend for his chronic kidney disease and states his creatinine has been around 3. Most recent creatinine 4 months ago was 3.53. Denies any urinary complaints or flank pain. Nephrology was consulted for chronic kidney disease.   Past Medical History  Diagnosis Date  . Diabetes mellitus   . Hypertension   . Chronic systolic heart failure     echo 04/12/11: Mild LVH, EF 30-35%, mid to distal anteroseptal and apical hypokinesis, grade 2 diastolic dysfunction, moderate LAE, mild RAE.  [name] Hyperlipidemia   . Tobacco abuse   . CAD (coronary artery disease)     acute anterior STEMI, late presentation 04/06/11 LHC 3/5 demonstrated severe ostial left main 95% stenosis and otherwise three-vessel CAD with an ejection fraction of 15%.  Emergent CABG: Dr. Roxan Hockey - Grafts: LIMA-LAD, SVG-D1, SVG-OM1, SVG-PDA and PL.  . Ischemic cardiomyopathy   . DM2 (diabetes mellitus, type 2)   . 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 Anemia     Post-CABG  . Arthritis   . Stroke   . Neuromuscular disorder     diabetic neuropathy  . Diarrhea     has had c diff diarrhea  . Weakness   . Bruises easily   . PAD (peripheral artery disease) 07/28/2012    Angiography 08/2012: Right: 90% focal stenosis in external iliac artery, long  occlusion of SFA with 3 vessel run off . s/p slef expanding stent placement to external iliac artery. Left: borderline significant disease in comon and external iliac arteries. Diffuse 70-90% in SFA with 3 vessel run off.   . Bilateral carotid artery disease   . Difficult intubation     PATIENT STATED HERNIATED 2 DISCS '@[facility]'  HAD TO HAVE CERVICAL SURGERY  . CHF (congestive heart failure)   . Acute renal insufficiency     05/2011 admission (cr 1.54 at discharge), WILL START HD IN FALL     Past Surgical History  Procedure Laterality Date  . Coronary artery bypass graft  04/06/2011    Procedure: CORONARY ARTERY BYPASS GRAFTING (CABG);  Surgeon: Melrose Nakayama, MD;  Location: [facility];  Service: Open Heart Surgery;  Laterality: N/A;  Coronary Artery Bypass Graft times four utilizing the left internal mammary artery and the Right and left  greater Saphenous veins Harvested endoscopically.  . Fibroid bypass 04/06/11    . Colon surgery      rectal fissure  . Cervical disc surgery  1997 - approximate  . Colonoscopy  07/29/2011    Procedure: COLONOSCOPY;  Surgeon: Milus Banister, MD;  Location: [facility];  Service: Endoscopy;  Laterality: N/A;  . Eye surgery  2011    cataract removal  . Left heart catheterization with coronary angiogram N/A 04/06/2011  Procedure: LEFT HEART CATHETERIZATION WITH CORONARY ANGIOGRAM;  Surgeon: Wellington Hampshire, MD;  Location: [facility];  Service: Cardiovascular;  Laterality: N/A;  . Abdominal aortagram N/A 08/02/2012    Procedure: ABDOMINAL Maxcine Ham;  Surgeon: Wellington Hampshire, MD;  Location: [facility];  Service: Cardiovascular;  Laterality: N/A;  . Percutaneous stent intervention Left 08/02/2012    Procedure: PERCUTANEOUS STENT INTERVENTION;  Surgeon: Wellington Hampshire, MD;  Location: [facility];  Service: Cardiovascular;  Laterality: Left;  stent x1 to lt ext iliac artery  . Lower extremity angiogram Bilateral 08/02/2012    Procedure: LOWER EXTREMITY ANGIOGRAM;   Surgeon: Wellington Hampshire, MD;  Location: [facility];  Service: Cardiovascular;  Laterality: Bilateral;  . Ercp      X2    Family History  Problem Relation Age of Onset  . Heart attack Mother     ?29s  . Diabetes Mother   . Heart disease Mother   . Hyperlipidemia Mother   . Hypertension Mother   . Peripheral vascular disease Mother   . Heart attack Sister     28s  . Deep vein thrombosis Sister   . Diabetes Sister     Patent examiner  . Heart disease Sister   . Hyperlipidemia Sister   . Hypertension Sister   . Congestive Heart Failure Father   . Heart disease Father   . Diabetes Brother   . Heart disease Brother   . Hyperlipidemia Brother     Social History:  reports that he quit smoking about 3 years ago. His smoking use included Cigarettes. He smoked 0.50 packs per day. He has never used smokeless tobacco. He reports that he does not drink alcohol or use illicit drugs.  Allergies:  Allergies  Allergen Reactions  . Diltiazem Hcl Hives and Swelling  . Nifedipine Hives and Swelling    Medications:  I have reviewed the patient's current medications. Prior to Admission:  Prescriptions prior to admission  Medication Sig Dispense Refill Last Dose  . aspirin 81 MG EC tablet Take 1 tablet (81 mg total) by mouth daily.   Past Week at Unknown time  . atorvastatin (LIPITOR) 20 MG tablet TAKE 1 TABLET EVERY DAY 30 tablet 5 08/29/2014 at 0940  . calcitRIOL (ROCALTROL) 0.25 MCG capsule Take 0.25 mcg by mouth daily.    08/29/2014 at [facility]  . carvedilol (COREG) 25 MG tablet TAKE 1 TABLET BY MOUTH TWICE A DAY WITH A MEAL 60 tablet 2 08/29/2014 at 0940  . clopidogrel (PLAVIX) 75 MG tablet TAKE 1 TABLET EVERY DAY 30 tablet 6 Past Week at Unknown time  . diphenoxylate-atropine (LOMOTIL) 2.5-0.025 MG per tablet TAKE 1 TABLET BY MOUTH 4 TIMES A DAY AS NEEDED FOR DIARRHEA 90 tablet 0 Past Week at Unknown time  . epoetin alfa (EPOGEN,PROCRIT) 3000 UNIT/ML injection Inject 3,000 Units into the skin  once. Take on wednesdays   08/28/2014 at Unknown time  . fluticasone (FLONASE) 50 MCG/ACT nasal spray Place 2 sprays into the nose daily.   08/29/2014 at 0940  . furosemide (LASIX) 40 MG tablet Take 40 mg by mouth daily as needed for fluid.   08/28/2014 at Unknown time  . hydrALAZINE (APRESOLINE) 25 MG tablet Take 1 tablet (25 mg total) by mouth 3 (three) times daily. 270 tablet 3 08/29/2014 at 0940  . HYDROcodone-acetaminophen (NORCO/VICODIN) 5-325 MG per tablet Take 1 tablet by mouth every 4 (four) hours as needed for severe pain.    Past Month at Unknown time  .  isosorbide dinitrate (ISORDIL) 20 MG tablet Take 1 tablet (20 mg total) by mouth 3 (three) times daily. 270 tablet 3 08/29/2014 at 0940  . nitroGLYCERIN (NITROSTAT) 0.4 MG SL tablet Place 0.4 mg under the tongue every 5 (five) minutes as needed for chest pain.   Past Month at Unknown time  . Opium Tincture, Paregoric, 2 MG/5ML TINC TAKE 1 TEASPOONFUL BY MOUTH 4 TIMES A DAY AS NEEDED FOR DIARRHEA OR LOOSE STOOLS 120 mL 2 Past Week at Unknown time  . oxyCODONE (OXY IR/ROXICODONE) 5 MG immediate release tablet Take 1 tablet (5 mg total) by mouth as needed. Per Dr. Jasper Riling 40 tablet 0 Taking  . oxycodone (OXY-IR) 5 MG capsule Take 5 mg by mouth every 4 (four) hours as needed for pain.   Past Month at Unknown time  . sodium bicarbonate 650 MG tablet Take 1,300 mg by mouth 2 (two) times daily.  12 08/29/2014 at 0940  . furosemide (LASIX) 40 MG tablet TAKE ONE TABLET DAILY AND MAY TAKE ONE EXTRA AS NEEDED (Patient not taking: Reported on 08/21/2014) 110 tablet 3 Not Taking at Unknown time  . glucose blood (ONETOUCH VERIO) test strip Use as instructed. DX: 250.00 100 each 3 Taking  . ONETOUCH DELICA LANCETS FINE MISC Use as instructed. DX: 250.00 100 each 3 Taking   Scheduled: . aspirin EC  81 mg Oral Daily  . atorvastatin  20 mg Oral q1800  . clopidogrel  75 mg Oral Daily  . fluticasone  2 spray Each Nare Daily  . heparin  5,000 Units Subcutaneous 3  times per day  . nitroGLYCERIN  0.2 mg Transdermal Daily  . pantoprazole (PROTONIX) IV  40 mg Intravenous Q24H  . potassium chloride  20-40 mEq Oral Once   Continuous:    Results for orders placed or performed during the hospital encounter of 08/29/14 (from the past 48 hour(s))  I-STAT, chem 8     Status: Abnormal   Collection Time: 08/29/14 11:27 AM  Result Value Ref Range   Sodium 144 135 - 145 mmol/L   Potassium 4.8 3.5 - 5.1 mmol/L   Chloride 110 101 - 111 mmol/L   BUN 72 (H) 6 - 20 mg/dL   Creatinine, Ser 3.90 (H) 0.61 - 1.24 mg/dL   Glucose, Bld 115 (H) 65 - 99 mg/dL   Calcium, Ion 1.12 (L) 1.13 - 1.30 mmol/L   TCO2 19 0 - 100 mmol/L   Hemoglobin 11.6 (L) 13.0 - 17.0 g/dL   HCT 34.0 (L) 39.0 - 52.0 %  Glucose, capillary     Status: None   Collection Time: 08/29/14  1:37 PM  Result Value Ref Range   Glucose-Capillary 90 65 - 99 mg/dL  CBC     Status: Abnormal   Collection Time: 08/29/14  4:45 PM  Result Value Ref Range   WBC 10.3 4.0 - 10.5 K/uL   RBC 3.32 (L) 4.22 - 5.81 MIL/uL   Hemoglobin 10.3 (L) 13.0 - 17.0 g/dL   HCT 31.1 (L) 39.0 - 52.0 %   MCV 93.7 78.0 - 100.0 fL   MCH 31.0 26.0 - 34.0 pg   MCHC 33.1 30.0 - 36.0 g/dL   RDW 14.6 11.5 - 15.5 %   Platelets 182 150 - 400 K/uL  Comprehensive metabolic panel     Status: Abnormal   Collection Time: 08/29/14  4:45 PM  Result Value Ref Range   Sodium 142 135 - 145 mmol/L   Potassium 4.4 3.5 - 5.1 mmol/L  Chloride 112 (H) 101 - 111 mmol/L   CO2 20 (L) 22 - 32 mmol/L   Glucose, Bld 166 (H) 65 - 99 mg/dL   BUN 70 (H) 6 - 20 mg/dL   Creatinine, Ser 3.49 (H) 0.61 - 1.24 mg/dL   Calcium 8.8 (L) 8.9 - 10.3 mg/dL   Total Protein 6.0 (L) 6.5 - 8.1 g/dL   Albumin 3.3 (L) 3.5 - 5.0 g/dL   AST 56 (H) 15 - 41 U/L   ALT 57 17 - 63 U/L   Alkaline Phosphatase 87 38 - 126 U/L   Total Bilirubin 0.8 0.3 - 1.2 mg/dL   GFR calc non Af Amer 17 (L) >60 mL/min   GFR calc Af Amer 19 (L) >60 mL/min    Comment: (NOTE) The eGFR  has been calculated using the CKD EPI equation. This calculation has not been validated in all clinical situations. eGFR's persistently <60 mL/min signify possible Chronic Kidney Disease.    Anion gap 10 5 - 15  Protime-INR     Status: Abnormal   Collection Time: 08/29/14  4:45 PM  Result Value Ref Range   Prothrombin Time 15.8 (H) 11.6 - 15.2 seconds   INR 1.24 0.00 - 1.49  Troponin I     Status: Abnormal   Collection Time: 08/29/14  4:45 PM  Result Value Ref Range   Troponin I 0.06 (H) <0.031 ng/mL    Comment:        PERSISTENTLY INCREASED TROPONIN VALUES IN THE RANGE OF 0.04-0.49 ng/mL CAN BE SEEN IN:       -UNSTABLE ANGINA       -CONGESTIVE HEART FAILURE       -MYOCARDITIS       -CHEST TRAUMA       -ARRYHTHMIAS       -LATE PRESENTING MYOCARDIAL INFARCTION       -COPD   CLINICAL FOLLOW-UP RECOMMENDED.   Glucose, capillary     Status: Abnormal   Collection Time: 08/29/14  5:35 PM  Result Value Ref Range   Glucose-Capillary 137 (H) 65 - 99 mg/dL  Troponin I     Status: Abnormal   Collection Time: 08/29/14 10:30 PM  Result Value Ref Range   Troponin I 0.22 (H) <0.031 ng/mL    Comment:        PERSISTENTLY INCREASED TROPONIN VALUES IN THE RANGE OF 0.04-0.49 ng/mL CAN BE SEEN IN:       -UNSTABLE ANGINA       -CONGESTIVE HEART FAILURE       -MYOCARDITIS       -CHEST TRAUMA       -ARRYHTHMIAS       -LATE PRESENTING MYOCARDIAL INFARCTION       -COPD   CLINICAL FOLLOW-UP RECOMMENDED.   Troponin I     Status: Abnormal   Collection Time: 08/30/14  4:20 AM  Result Value Ref Range   Troponin I 0.20 (H) <0.031 ng/mL    Comment:        PERSISTENTLY INCREASED TROPONIN VALUES IN THE RANGE OF 0.04-0.49 ng/mL CAN BE SEEN IN:       -UNSTABLE ANGINA       -CONGESTIVE HEART FAILURE       -MYOCARDITIS       -CHEST TRAUMA       -ARRYHTHMIAS       -LATE PRESENTING MYOCARDIAL INFARCTION       -COPD   CLINICAL FOLLOW-UP RECOMMENDED.   Basic metabolic panel  Status:  Abnormal   Collection Time: 08/30/14  7:30 AM  Result Value Ref Range   Sodium 139 135 - 145 mmol/L   Potassium 4.5 3.5 - 5.1 mmol/L   Chloride 112 (H) 101 - 111 mmol/L   CO2 19 (L) 22 - 32 mmol/L   Glucose, Bld 80 65 - 99 mg/dL   BUN 62 (H) 6 - 20 mg/dL   Creatinine, Ser 3.54 (H) 0.61 - 1.24 mg/dL   Calcium 8.1 (L) 8.9 - 10.3 mg/dL   GFR calc non Af Amer 16 (L) >60 mL/min   GFR calc Af Amer 19 (L) >60 mL/min    Comment: (NOTE) The eGFR has been calculated using the CKD EPI equation. This calculation has not been validated in all clinical situations. eGFR's persistently <60 mL/min signify possible Chronic Kidney Disease.    Anion gap 8 5 - 15  CBC     Status: Abnormal   Collection Time: 08/30/14  7:30 AM  Result Value Ref Range   WBC 7.9 4.0 - 10.5 K/uL   RBC 3.25 (L) 4.22 - 5.81 MIL/uL   Hemoglobin 10.3 (L) 13.0 - 17.0 g/dL   HCT 30.8 (L) 39.0 - 52.0 %   MCV 94.8 78.0 - 100.0 fL   MCH 31.7 26.0 - 34.0 pg   MCHC 33.4 30.0 - 36.0 g/dL   RDW 14.5 11.5 - 15.5 %   Platelets 166 150 - 400 K/uL    Dg Chest Port 1 View  08/29/2014   CLINICAL DATA:  Go status post chest compressions, evaluate for broken ribs  EXAM: PORTABLE CHEST - 1 VIEW  COMPARISON:  06/02/2011  FINDINGS: Mild cardiac enlargement allowing for technique status post prior CABG. No pneumothorax. Mild left lower lobe consolidation possibly atelectasis similar to prior study. Vascular congestion with mild diffuse interstitial prominence. Bony thorax grossly intact without evidence of rib fracture on this single AP view.  IMPRESSION: Limited evaluation of the rib cage shows no fracture.  Evidence of mild pulmonary edema.  Persistent left lower lobe consolidation as seen on prior studies.   Electronically Signed   By: Skipper Cliche M.D.   On: 08/29/2014 16:57    ROS Pertinent ROS in HPI Blood pressure 149/64, pulse 73, temperature 98.1 F (36.7 C), temperature source Oral, resp. rate 15, height '5\' 8"'  (1.727 m), weight  202 lb (91.627 kg), SpO2 96 %. Physical Exam  Constitutional: He appears well-developed and well-nourished. No distress.  HENT:  Head: Normocephalic and atraumatic.  Rt lateral neck incision scar intact and non draining   Eyes: Conjunctivae are normal.  Cardiovascular: Normal rate and regular rhythm.   No murmur heard. Respiratory: Effort normal and breath sounds normal. No respiratory distress. He has no wheezes. He has no rales. He exhibits tenderness (rib tenderness).  GI: Soft. Bowel sounds are normal. He exhibits no distension. There is no tenderness. There is no rebound.  Musculoskeletal: Normal range of motion. He exhibits no edema.  Neurological: He is alert.  Skin: Skin is warm and dry.     Assessment/Plan: 1 CKD IV- creatinine at baseline, 3.54 today. Previous creatinine 3.53 and 3.45 four and ten months ago respectively. He had PEA arrest w/ CPR x 5 minute which could lead to an AKI from hypoperfusion. No signs of that currently, will continue to follow chem panels.  2. DM- diet controlled 3 Hypertension- per primay 4. Anemia -hgb stable, on epogen 5. HLD 6. CAD  Julious Oka 08/30/2014, 10:01 AM

## 2014-08-31 DIAGNOSIS — L8990 Pressure ulcer of unspecified site, unspecified stage: Secondary | ICD-10-CM | POA: Insufficient documentation

## 2014-08-31 LAB — CBC
HEMATOCRIT: 32.1 % — AB (ref 39.0–52.0)
HEMOGLOBIN: 10.6 g/dL — AB (ref 13.0–17.0)
MCH: 31.1 pg (ref 26.0–34.0)
MCHC: 33 g/dL (ref 30.0–36.0)
MCV: 94.1 fL (ref 78.0–100.0)
Platelets: 175 10*3/uL (ref 150–400)
RBC: 3.41 MIL/uL — ABNORMAL LOW (ref 4.22–5.81)
RDW: 14.4 % (ref 11.5–15.5)
WBC: 8 10*3/uL (ref 4.0–10.5)

## 2014-08-31 LAB — BASIC METABOLIC PANEL
Anion gap: 8 (ref 5–15)
BUN: 60 mg/dL — AB (ref 6–20)
CO2: 19 mmol/L — ABNORMAL LOW (ref 22–32)
CREATININE: 3.43 mg/dL — AB (ref 0.61–1.24)
Calcium: 8.2 mg/dL — ABNORMAL LOW (ref 8.9–10.3)
Chloride: 116 mmol/L — ABNORMAL HIGH (ref 101–111)
GFR calc Af Amer: 20 mL/min — ABNORMAL LOW (ref >60)
GFR calc non Af Amer: 17 mL/min — ABNORMAL LOW (ref >60)
Glucose, Bld: 90 mg/dL (ref 65–99)
Potassium: 4.7 mmol/L (ref 3.5–5.1)
Sodium: 143 mmol/L (ref 135–145)

## 2014-08-31 MED ORDER — CARVEDILOL 6.25 MG PO TABS
6.2500 mg | ORAL_TABLET | Freq: Two times a day (BID) | ORAL | Status: DC
  Administered 2014-08-31 – 2014-09-01 (×2): 6.25 mg via ORAL
  Filled 2014-08-31 (×4): qty 1

## 2014-08-31 MED ORDER — PANTOPRAZOLE SODIUM 40 MG PO TBEC
40.0000 mg | DELAYED_RELEASE_TABLET | Freq: Every day | ORAL | Status: DC
  Administered 2014-08-31: 40 mg via ORAL
  Filled 2014-08-31: qty 1

## 2014-08-31 NOTE — Progress Notes (Addendum)
   Daily Progress Note  Unfortunately, entire vascular surgical team in OR for last ~12 hours, so unable to discharge patient today.  Will plan on discharge home tomorrow.  - Ok to transfer to floor if bed needed  Leonides Sake, MD [facility] Office: [phone] Pager: [phone]  08/31/2014, 9:08 PM

## 2014-08-31 NOTE — Progress Notes (Signed)
      Plan to discharge home once Cardiology has completed their work up and makes recommendations.     Kayti Poss MAUREEN PA-C

## 2014-08-31 NOTE — Progress Notes (Signed)
[facility] ROUNDING NOTE   Subjective:   Interval History: no complaints  Objective:  Vital signs in last 24 hours:  Temp:  [98.1 F (36.7 C)-98.8 F (37.1 C)] 98.1 F (36.7 C) (07/30 0700) Pulse Rate:  [71-80] 77 (07/30 1000) Resp:  [11-24] 13 (07/30 1000) BP: (131-160)/(51-84) 160/69 mmHg (07/30 1000) SpO2:  [94 %-100 %] 98 % (07/30 1000)  Weight change:  Filed Weights   08/29/14 1120 08/29/14 1123  Weight: 91.627 kg (202 lb) 91.627 kg (202 lb)    Intake/Output: I/O last 3 completed shifts: In: 1250 [P.O.:700; I.V.:550] Out: 880 [Urine:880]   Intake/Output this shift:  Total I/O In: 240 [P.O.:240] Out: 450 [Urine:450]  CVS- RRR RS- CTA ABD- BS present soft non-distended EXT- no edema   Basic Metabolic Panel:  Recent Labs Lab 08/29/14 1127 08/29/14 1645 08/30/14 0730 08/31/14 0205  NA 144 142 139 143  K 4.8 4.4 4.5 4.7  CL 110 112* 112* 116*  CO2  --  20* 19* 19*  GLUCOSE 115* 166* 80 90  BUN 72* 70* 62* 60*  CREATININE 3.90* 3.49* 3.54* 3.43*  CALCIUM  --  8.8* 8.1* 8.2*    Liver Function Tests:  Recent Labs Lab 08/29/14 1645  AST 56*  ALT 57  ALKPHOS 87  BILITOT 0.8  PROT 6.0*  ALBUMIN 3.3*   No results for input(s): LIPASE, AMYLASE in the last 168 hours. No results for input(s): AMMONIA in the last 168 hours.  CBC:  Recent Labs Lab 08/28/14 1336 08/29/14 1127 08/29/14 1645 08/30/14 0730 08/31/14 0205  WBC  --   --  10.3 7.9 8.0  HGB 10.7* 11.6* 10.3* 10.3* 10.6*  HCT  --  34.0* 31.1* 30.8* 32.1*  MCV  --   --  93.7 94.8 94.1  PLT  --   --  182 166 175    Cardiac Enzymes:  Recent Labs Lab 08/29/14 1645 08/29/14 2230 08/30/14 0420  TROPONINI 0.06* 0.22* 0.20*    BNP: Invalid input(s): POCBNP  CBG:  Recent Labs Lab 08/29/14 1337 08/29/14 1735  GLUCAP 90 137*    Microbiology: Results for orders placed or performed during the hospital encounter of 08/21/14  Surgical pcr screen     Status:  None   Collection Time: 08/21/14  3:14 PM  Result Value Ref Range Status   MRSA, PCR NEGATIVE NEGATIVE Final   Staphylococcus aureus NEGATIVE NEGATIVE Final    Comment:        The Xpert SA Assay (FDA approved for NASAL specimens in patients over 64 years of age), is one component of a comprehensive surveillance program.  Test performance has been validated by [facility] for patients greater than or equal to 9 year old. It is not intended to diagnose infection nor to guide or monitor treatment.     Coagulation Studies:  Recent Labs  08/29/14 1645  LABPROT 15.8*  INR 1.24    Urinalysis: No results for input(s): COLORURINE, LABSPEC, PHURINE, GLUCOSEU, HGBUR, BILIRUBINUR, KETONESUR, PROTEINUR, UROBILINOGEN, NITRITE, LEUKOCYTESUR in the last 72 hours.  Invalid input(s): APPERANCEUR    Imaging: Dg Chest Port 1 View  08/29/2014   CLINICAL DATA:  Go status post chest compressions, evaluate for broken ribs  EXAM: PORTABLE CHEST - 1 VIEW  COMPARISON:  06/02/2011  FINDINGS: Mild cardiac enlargement allowing for technique status post prior CABG. No pneumothorax. Mild left lower lobe consolidation possibly atelectasis similar to prior study. Vascular congestion with mild diffuse interstitial prominence. Bony thorax grossly intact  without evidence of rib fracture on this single AP view.  IMPRESSION: Limited evaluation of the rib cage shows no fracture.  Evidence of mild pulmonary edema.  Persistent left lower lobe consolidation as seen on prior studies.   Electronically Signed   By: Esperanza Heir M.D.   On: 08/29/2014 16:57     Medications:     . aspirin EC  81 mg Oral Daily  . atorvastatin  20 mg Oral q1800  . clopidogrel  75 mg Oral Daily  . fluticasone  2 spray Each Nare Daily  . heparin  5,000 Units Subcutaneous 3 times per day  . nitroGLYCERIN  0.2 mg Transdermal Daily  . pantoprazole (PROTONIX) IV  40 mg Intravenous Q24H  . potassium chloride  20-40 mEq Oral Once    bisacodyl, guaiFENesin-dextromethorphan, hydrALAZINE, HYDROcodone-acetaminophen, labetalol, metoprolol, morphine injection, ondansetron, oxyCODONE, phenol  Assessment/ Plan:  Assessment/Plan: 1 CKD IV- creatinine at baseline 2. DM- diet controlled 3 Hypertension- per primay 4. Anemia -hgb stable, on epogen 5. HLD 6. CAD    LOS: 2 Kurt Richard W @TODAY @10 :58 AM

## 2014-08-31 NOTE — Progress Notes (Signed)
    Subjective:    No complaints  Objective:   Temp:  [98.1 F (36.7 C)-98.8 F (37.1 C)] 98.3 F (36.8 C) (07/30 1100) Pulse Rate:  [71-80] 77 (07/30 1000) Resp:  [11-24] 13 (07/30 1000) BP: (131-160)/(51-84) 160/69 mmHg (07/30 1000) SpO2:  [94 %-99 %] 98 % (07/30 1000) Last BM Date: 08/28/14  Filed Weights   08/29/14 1120 08/29/14 1123  Weight: 202 lb (91.627 kg) 202 lb (91.627 kg)    Intake/Output Summary (Last 24 hours) at 08/31/14 1226 Last data filed at 08/31/14 0800  Gross per 24 hour  Intake    480 ml  Output    680 ml  Net   -200 ml    Telemetry: NSR  Exam:  General: NAD  Resp: CTAB  Cardiac: RRR, no m/r/g, no JVD  GI: abdomen soft, NT, ND  MSK:no LE edema  Neuro: no focal deficits  Psych: appropriate affect  Lab Results:  Basic Metabolic Panel:  Recent Labs Lab 08/29/14 1645 08/30/14 0730 08/31/14 0205  NA 142 139 143  K 4.4 4.5 4.7  CL 112* 112* 116*  CO2 20* 19* 19*  GLUCOSE 166* 80 90  BUN 70* 62* 60*  CREATININE 3.49* 3.54* 3.43*  CALCIUM 8.8* 8.1* 8.2*    Liver Function Tests:  Recent Labs Lab 08/29/14 1645  AST 56*  ALT 57  ALKPHOS 87  BILITOT 0.8  PROT 6.0*  ALBUMIN 3.3*    CBC:  Recent Labs Lab 08/29/14 1645 08/30/14 0730 08/31/14 0205  WBC 10.3 7.9 8.0  HGB 10.3* 10.3* 10.6*  HCT 31.1* 30.8* 32.1*  MCV 93.7 94.8 94.1  PLT 182 166 175    Cardiac Enzymes:  Recent Labs Lab 08/29/14 1645 08/29/14 2230 08/30/14 0420  TROPONINI 0.06* 0.22* 0.20*    BNP: No results for input(s): PROBNP in the last 8760 hours.  Coagulation:  Recent Labs Lab 08/29/14 1645  INR 1.24    ECG:   Medications:   Scheduled Medications: . aspirin EC  81 mg Oral Daily  . atorvastatin  20 mg Oral q1800  . clopidogrel  75 mg Oral Daily  . fluticasone  2 spray Each Nare Daily  . heparin  5,000 Units Subcutaneous 3 times per day  . nitroGLYCERIN  0.2 mg Transdermal Daily  . pantoprazole (PROTONIX) IV  40 mg  Intravenous Q24H  . potassium chloride  20-40 mEq Oral Once     Infusions:     PRN Medications:  bisacodyl, guaiFENesin-dextromethorphan, hydrALAZINE, HYDROcodone-acetaminophen, labetalol, metoprolol, morphine injection, ondansetron, oxyCODONE, phenol     Assessment/Plan    1. PEA arrest - episode intraop during carotid endarterctomy, possible bareoceptor mediated +/- anesthesia related.  - mild troponin to 0.2 now trending down, chronic lateral ST/T changes on EKG without specific acute ischemic changes. Suspect mild troponin elevation due to hypotension and related drop in coronary perfusion pressure as opposed to ACS - echo 08/30/14 shows LVEF 20-25%, diffuse hypokinesis, grade II diastolic dysfunction. Decreased from echo 03/2012 where LVEF was 35-40%. No new focal WMAs, the diffuse hypokinesis may be related to a stress induced CM.  Restart his home beta blocker at lower dose, no ACE due to poor renal function.   Ok for discharge from cardiac standpoint. We will have him f/u with Dr Elease Hashimoto in 2-3 weeks.      Dina Rich, M.D.

## 2014-08-31 NOTE — Progress Notes (Signed)
   Daily Progress Note  Assessment/Planning: POD #2 s/p aborted R CEA   Once Cardiology ok with D/C , will d/c home    Subjective  - 2 Days Post-Op  No complaints, no events  Objective Filed Vitals:   08/31/14 0400 08/31/14 0500 08/31/14 0600 08/31/14 0700  BP: 148/57 144/63 131/70 147/65  Pulse: 75 71 72 73  Temp: 98.4 F (36.9 C)     TempSrc: Oral     Resp: Height:      Weight:      SpO2: 99% 98% 97% 98%    Intake/Output Summary (Last 24 hours) at 08/31/14 0719 Last data filed at 08/30/14 1959  Gross per 24 hour  Intake    400 ml  Output    530 ml  Net   -130 ml    PULM  BLL rales CV  RRR GI  soft, NTND VASC  R neck without hematoma, inc c/d/i  Laboratory CBC    Component Value Date/Time   WBC 8.0 08/31/2014 0205   HGB 10.6* 08/31/2014 0205   HCT 32.1* 08/31/2014 0205   PLT 175 08/31/2014 0205    BMET    Component Value Date/Time   NA 143 08/31/2014 0205   K 4.7 08/31/2014 0205   CL 116* 08/31/2014 0205   CO2 19* 08/31/2014 0205   GLUCOSE 90 08/31/2014 0205   BUN 60* 08/31/2014 0205   CREATININE 3.43* 08/31/2014 0205   CALCIUM 8.2* 08/31/2014 0205   GFRNONAA 17* 08/31/2014 0205   GFRAA 20* 08/31/2014 0205    Leonides Sake, MD [facility] Office: [phone] Pager: [phone]  08/31/2014, 7:19 AM

## 2014-09-01 MED ORDER — NITROGLYCERIN 0.4 MG SL SUBL: 0.4000 mg | SUBLINGUAL_TABLET | SUBLINGUAL | Status: DC | PRN

## 2014-09-01 MED ORDER — OXYCODONE-ACETAMINOPHEN 5-325 MG PO TABS: 1.0000 | ORAL_TABLET | Freq: Four times a day (QID) | ORAL | Status: DC | PRN

## 2014-09-01 MED ORDER — HYDROCODONE-ACETAMINOPHEN 5-325 MG PO TABS: 1.0000 | ORAL_TABLET | ORAL | Status: DC | PRN

## 2014-09-01 NOTE — Progress Notes (Addendum)
[facility]  Subjective  - Ready to go home   Objective 166/61 81 98.3 F (36.8 C) (Oral) 12 94%  Intake/Output Summary (Last 24 hours) at 09/01/14 0815 Last data filed at 09/01/14 0600  Gross per 24 hour  Intake    240 ml  Output   1050 ml  Net   -810 ml    Incision well healed Heart RRRR Lungs non labored breathing   Assessment/Planning: Attempted CEA aborted secondary to cardiac event.  Dr. Myra Gianotti discussed that he would not want to redo CEA for at least 3 months due to scar tissue from his operation 08/29/2014.    ESRD he has an appointment Aug 20 the he will keep to discuss access.  CR 3.43 currently  Cardiology 1. PEA arrest - episode intraop during carotid endarterctomy, possible bareoceptor mediated +/- anesthesia related.  - mild troponin to 0.2 now trending down, chronic lateral ST/T changes on EKG without specific acute ischemic changes. Suspect mild troponin elevation due to hypotension and related drop in coronary perfusion pressure as opposed to ACS - echo 08/30/14 shows LVEF 20-25%, diffuse hypokinesis, grade II diastolic dysfunction. Decreased from echo 03/2012 where LVEF was 35-40%. No new focal WMAs, the diffuse hypokinesis may be related to a stress induced CM. Restart his home beta blocker at lower dose, no ACE due to poor renal function.  Cardiology no medication changes, f/u with Dr Elease Hashimoto in 2-3 weeks.   Clinton Gallant [facility] 09/01/2014 8:15 AM --  Laboratory Lab Results:  Recent Labs  08/30/14 0730 08/31/14 0205  WBC 7.9 8.0  HGB 10.3* 10.6*  HCT 30.8* 32.1*  PLT 166 175   BMET  Recent Labs  08/30/14 0730 08/31/14 0205  NA 139 143  K 4.5 4.7  CL 112* 116*  CO2 19* 19*  GLUCOSE 80 90  BUN 62* 60*  CREATININE 3.54* 3.43*  CALCIUM 8.1* 8.2*    COAG Lab Results  Component Value Date   INR 1.24 08/29/2014   INR 1.14 08/21/2014   INR 1.08 08/01/2012   No results found for: PTT

## 2014-09-01 NOTE — Progress Notes (Signed)
Pt received d/c information via teach back method and verbalized understanding. 2 prescriptions given. Pt belongings (clothing, glasses, cane) in his possession upon discharge. Pt spouse, taking pt home via private vehicle.   Kurt Richard

## 2014-09-02 ENCOUNTER — Telehealth: Payer: Self-pay | Admitting: *Deleted

## 2014-09-02 NOTE — Telephone Encounter (Signed)
Spoke with Mindi Curling (DPR) for transitional Care Management.  He will call back tomorrow to make an appointment.

## 2014-09-03 ENCOUNTER — Telehealth: Payer: Self-pay | Admitting: Surgery

## 2014-09-03 ENCOUNTER — Encounter: Payer: Self-pay | Admitting: Family Medicine

## 2014-09-03 ENCOUNTER — Ambulatory Visit (INDEPENDENT_AMBULATORY_CARE_PROVIDER_SITE_OTHER): Payer: Medicare Other | Admitting: Family Medicine

## 2014-09-03 VITALS — BP 124/80 | HR 73 | Temp 98.2°F | Wt 203.0 lb

## 2014-09-03 DIAGNOSIS — M549 Dorsalgia, unspecified: Secondary | ICD-10-CM

## 2014-09-03 DIAGNOSIS — N183 Chronic kidney disease, stage 3 unspecified: Secondary | ICD-10-CM

## 2014-09-03 DIAGNOSIS — I10 Essential (primary) hypertension: Secondary | ICD-10-CM | POA: Diagnosis not present

## 2014-09-03 DIAGNOSIS — M546 Pain in thoracic spine: Secondary | ICD-10-CM | POA: Diagnosis not present

## 2014-09-03 NOTE — Progress Notes (Signed)
Pre visit review using our clinic review tool, if applicable. No additional management support is needed unless otherwise documented below in the visit note. 

## 2014-09-03 NOTE — Discharge Summary (Signed)
[facility] Discharge Summary   Patient ID:  Kurt Richard MRN: [id number] DOB/AGE: 06-23-1945 69 y.o.  Admit date: 08/29/2014 Discharge date: 09/01/2014 Date of Surgery: 08/29/2014 Surgeon: Surgeon(s): Nada Libman, MD  Admission Diagnosis: Right carotid artery stenosis I65.21  Discharge Diagnoses:  Right carotid artery stenosis I65.21  Secondary Diagnoses: Past Medical History  Diagnosis Date  . Diabetes mellitus   . Hypertension   . Chronic systolic heart failure     echo 04/12/11: Mild LVH, EF 30-35%, mid to distal anteroseptal and apical hypokinesis, grade 2 diastolic dysfunction, moderate LAE, mild RAE.  [name] Hyperlipidemia   . Tobacco abuse   . CAD (coronary artery disease)     acute anterior STEMI, late presentation 04/06/11 LHC 3/5 demonstrated severe ostial left main 95% stenosis and otherwise three-vessel CAD with an ejection fraction of 15%.  Emergent CABG: Dr. Dorris Fetch - Grafts: LIMA-LAD, SVG-D1, SVG-OM1, SVG-PDA and PL.  . Ischemic cardiomyopathy   . DM2 (diabetes mellitus, type 2)   . 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 Anemia     Post-CABG  . Arthritis   . Stroke   . Neuromuscular disorder     diabetic neuropathy  . Diarrhea     has had c diff diarrhea  . Weakness   . Bruises easily   . PAD (peripheral artery disease) 07/28/2012    Angiography 08/2012: Right: 90% focal stenosis in external iliac artery, long occlusion of SFA with 3 vessel run off . s/p slef expanding stent placement to external iliac artery. Left: borderline significant disease in comon and external iliac arteries. Diffuse 70-90% in SFA with 3 vessel run off.   . Bilateral carotid artery disease   . Difficult intubation     PATIENT STATED HERNIATED 2 DISCS @[facility]  HAD TO HAVE CERVICAL SURGERY  . CHF (congestive heart failure)   . Acute renal insufficiency     05/2011 admission (cr 1.54 at discharge), WILL START HD IN FALL     Procedure(s): Attempted  ENDARTERECTOMY CAROTID  Discharged Condition: good  HPI:  This is a 69 year old gentleman who is referred today for carotid stenosis. He has been followed with ultrasound and now has greater than 80% right carotid stenosis. He is asymptomatic. Specifically, he denies numbness or weakness in either extremity. He denies slurred speech. He denies amaurosis fugax.  The patient suffers from hypercholesterolemia which is managed with a statin. He is a diet-controlled diabetic with hemoglobin A1c is in the 6 range. He has stage IV renal insufficiency and is being evaluated for dialysis in the next month. He takes injections for anemia. He is a former smoker having quit 3 years ago. He suffers from peripheral vascular disease and has undergone stenting in his left lower extremity 2 years ago. He is on Plavix for this. He also has coronary artery disease. He has never had a heart attack but did have CABG in 2013. He states that around the time of his CABG he had a CT scan for dysarthria which showed a chronic right PCA infarct. The patient has chronic diarrhea which is very severe.   Hospital Course:  Kurt Richard is a 69 y.o. male is S/P Right Procedure(s): Attempted ENDARTERECTOMY CAROTID During exposure of the carotid artery, the patient developed hypotension. He received chest compressions for approximately 5 minutes. He required chest compression fro approximately 5 minutes when he lost a pulse at the beginning of his attempted carotid endarterectomy. I terminated the operation  after a strong pulse was regained. I had not exposed the carotid artery, only ligated the facial vein. The patient awoke from surgery without any deficits. He was moving all 4 extremities to command i the PACU after being extubated in the OR. He responded appropriately to all questions. Etiology of the event is unknown. He is currently undergoing workup with serial lab checks and a 2-D echo pending.    I discussed with patient and partner that I would not plan on attempting CEA until the etiology of the event was determined. Depending on when this occurs, I would not want to redo CEA for at least 3 months due to scar tissue from his operation today. He will be followed by Dr. Imogene Burn on Friday until discharge as I am out of town. Family understands. Cardiology consult was called.   PLAN: 1. Patient should be recovered in the surgical ICU 2. Serial cardiac markers will be obtained 3. If blood pressure and clinical state will tolerate, we will add long-acting nitrates by patch 4. 2-D Doppler echocardiogram to reassess LV function and severity of aortic stenosis 5. No indication for acute coronary angiography as the patient is not having STEMI by EKG criteria and would be a great risk for development of acute kidney injury if exposed to contrast.  POD# 1  Intraop PEA lead to CPR and abort of R CEA. Cardiac work-up in progress. Echo today. If stable over next 24 hour and cardiac cath not needed, maybe home tomorrow.  Renal consult Dr. Hyman Hopes will follow.   Cardiology: HTN: prn labetolol for systolic hypertension. May have been a combination of baroreceptor response with anesthesia that caused the event. Low level positive troponins that are more likely from CRI than ACS.  POD# 2 CKD IV- creatinine at baseline, DM diet controlled Cardiology: 1. PEA arrest - episode intraop during carotid endarterctomy, possible bareoceptor mediated +/- anesthesia related.  - mild troponin to 0.2 now trending down, chronic lateral ST/T changes on EKG without specific acute ischemic changes. Suspect mild troponin elevation due to hypotension and related drop in coronary perfusion pressure as opposed to ACS - echo 08/30/14 shows LVEF 20-25%, diffuse hypokinesis, grade II diastolic dysfunction. Decreased from echo 03/2012 where LVEF was 35-40%. No new focal WMAs, the diffuse hypokinesis may be related to a stress  induced CM. Restart his home beta blocker at lower dose, no ACE due to poor renal function.   Ok for discharge from cardiac standpoint. We will have him f/u with Dr Elease Hashimoto in 2-3 weeks.  Patient was given pain medication at discharge and a refill on his nitroglycerin he was out at home and it was a weekend day.     Consults:  Treatment Team:  Rounding [facility], MD Elvis Coil, MD  Significant Diagnostic Studies: CBC Lab Results  Component Value Date   WBC 8.0 08/31/2014   HGB 10.6* 08/31/2014   HCT 32.1* 08/31/2014   MCV 94.1 08/31/2014   PLT 175 08/31/2014    BMET    Component Value Date/Time   NA 143 08/31/2014 0205   K 4.7 08/31/2014 0205   CL 116* 08/31/2014 0205   CO2 19* 08/31/2014 0205   GLUCOSE 90 08/31/2014 0205   BUN 60* 08/31/2014 0205   CREATININE 3.43* 08/31/2014 0205   CALCIUM 8.2* 08/31/2014 0205   GFRNONAA 17* 08/31/2014 0205   GFRAA 20* 08/31/2014 0205   COAG Lab Results  Component Value Date   INR 1.24 08/29/2014   INR 1.14 08/21/2014  INR 1.08 08/01/2012     Disposition:  Discharge to :Home Discharge Instructions    Call MD for:  redness, tenderness, or signs of infection (pain, swelling, bleeding, redness, odor or green/yellow discharge around incision site)    Complete by:  As directed      Call MD for:  severe or increased pain, loss or decreased feeling  in affected limb(s)    Complete by:  As directed      Call MD for:  temperature >100.5    Complete by:  As directed      Discharge instructions    Complete by:  As directed   You may shower daily     Driving Restrictions    Complete by:  As directed   No driving for 2 weeks     Increase activity slowly    Complete by:  As directed   Walk with assistance use Breeding or cane as needed     Lifting restrictions    Complete by:  As directed   No lifting for 6 weeks     Resume previous diet    Complete by:  As directed             Medication List    TAKE these medications         aspirin 81 MG EC tablet  Take 1 tablet (81 mg total) by mouth daily.     atorvastatin 20 MG tablet  Commonly known as:  LIPITOR  TAKE 1 TABLET EVERY DAY     calcitRIOL 0.25 MCG capsule  Commonly known as:  ROCALTROL  Take 0.25 mcg by mouth daily.     carvedilol 25 MG tablet  Commonly known as:  COREG  TAKE 1 TABLET BY MOUTH TWICE A DAY WITH A MEAL     clopidogrel 75 MG tablet  Commonly known as:  PLAVIX  TAKE 1 TABLET EVERY DAY     diphenoxylate-atropine 2.5-0.025 MG per tablet  Commonly known as:  LOMOTIL  TAKE 1 TABLET BY MOUTH 4 TIMES A DAY AS NEEDED FOR DIARRHEA     epoetin alfa 3000 UNIT/ML injection  Commonly known as:  EPOGEN,PROCRIT  Inject 3,000 Units into the skin once. Take on wednesdays     fluticasone 50 MCG/ACT nasal spray  Commonly known as:  FLONASE  Place 2 sprays into the nose daily.     furosemide 40 MG tablet  Commonly known as:  LASIX  Take 40 mg by mouth daily as needed for fluid.     furosemide 40 MG tablet  Commonly known as:  LASIX  TAKE ONE TABLET DAILY AND MAY TAKE ONE EXTRA AS NEEDED     glucose blood test strip  Commonly known as:  ONETOUCH VERIO  Use as instructed. DX: 250.00     hydrALAZINE 25 MG tablet  Commonly known as:  APRESOLINE  Take 1 tablet (25 mg total) by mouth 3 (three) times daily.     HYDROcodone-acetaminophen 5-325 MG per tablet  Commonly known as:  NORCO/VICODIN  Take 1 tablet by mouth every 4 (four) hours as needed for severe pain.     HYDROcodone-acetaminophen 5-325 MG per tablet  Commonly known as:  NORCO/VICODIN  Take 1 tablet by mouth every 4 (four) hours as needed for severe pain.     isosorbide dinitrate 20 MG tablet  Commonly known as:  ISORDIL  Take 1 tablet (20 mg total) by mouth 3 (three) times daily.     nitroGLYCERIN 0.4 MG  SL tablet  Commonly known as:  NITROSTAT  Place 1 tablet (0.4 mg total) under the tongue every 5 (five) minutes as needed for chest pain.     ONETOUCH DELICA LANCETS  FINE Misc  Use as instructed. DX: 250.00     Opium Tincture (Paregoric) 2 MG/5ML Tinc  TAKE 1 TEASPOONFUL BY MOUTH 4 TIMES A DAY AS NEEDED FOR DIARRHEA OR LOOSE STOOLS     oxycodone 5 MG capsule  Commonly known as:  OXY-IR  Take 5 mg by mouth every 4 (four) hours as needed for pain.     oxyCODONE 5 MG immediate release tablet  Commonly known as:  Oxy IR/ROXICODONE  Take 1 tablet (5 mg total) by mouth as needed. Per Dr. Georgann Housekeeper     oxyCODONE-acetaminophen 5-325 MG per tablet  Commonly known as:  PERCOCET/ROXICET  Take 1 tablet by mouth every 6 (six) hours as needed.     sodium bicarbonate 650 MG tablet  Take 1,300 mg by mouth 2 (two) times daily.       Verbal and written Discharge instructions given to the patient. Wound care per Discharge AVS     Follow-up Information    Follow up with Durene Cal, MD.   Specialties:  Vascular Surgery, Cardiology   Why:  sent message to office   Contact information:   [address] [place] [address] [phone]       Signed: Clinton Gallant [facility] 09/03/2014, 11:47 AM

## 2014-09-03 NOTE — Telephone Encounter (Signed)
-----   Message from Sharee Pimple, RN sent at 09/01/2014 12:28 PM EDT ----- Regarding: Schedule   ----- Message -----    From: Lars Mage, PA-C    Sent: 09/01/2014   8:25 AM      To: [facility]vs Charge Pool  F/U with Dr. Myra Gianotti 3 weeks he has an appt. In August already just keep it.  He needs to discuss access and Dr. Myra Gianotti had to abort CEA 3 days ago patient had MI in OR

## 2014-09-03 NOTE — Progress Notes (Signed)
Subjective:    Patient ID: Kurt Richard, male    DOB: 11-May-1945, 69 y.o.   MRN: [id number]  HPI Patient seen for hospital follow-up. Recently admitted for right carotid endarterectomy.  Pt had PEA arrest during the procedure possibly baroreceptor mediated +/- anesthesia related. He had very mild bump in troponin to 0.2 without acute EKG changes and suspected mild bump in troponin related to hypotension and decreased coronary perfusion. No evidence for acute coronary syndrome.  Echocardiogram EF 20-25%. No new focal wall motion abnormalities. No severe aortic stenosis. Patient stabilized and discharged home. He has follow-up with cardiologist in 2-3 weeks.  His only complaint now is some right periscapular pain. Somewhat intermittent. Worse with movement. Denies any cough or fever. No skin rashes. He apparently had CPR during the event and follow-up x-ray did not reveal any pneumothorax. Has taken Percocet which has relieved his pain. He does not have any localized tenderness to palpation.  Past Medical History  Diagnosis Date  . Diabetes mellitus   . Hypertension   . Chronic systolic heart failure     echo 04/12/11: Mild LVH, EF 30-35%, mid to distal anteroseptal and apical hypokinesis, grade 2 diastolic dysfunction, moderate LAE, mild RAE.  [name] Hyperlipidemia   . Tobacco abuse   . CAD (coronary artery disease)     acute anterior STEMI, late presentation 04/06/11 LHC 3/5 demonstrated severe ostial left main 95% stenosis and otherwise three-vessel CAD with an ejection fraction of 15%.  Emergent CABG: Dr. Dorris Fetch - Grafts: LIMA-LAD, SVG-D1, SVG-OM1, SVG-PDA and PL.  . Ischemic cardiomyopathy   . DM2 (diabetes mellitus, type 2)   . 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 Anemia     Post-CABG  . Arthritis   . Stroke   . Neuromuscular disorder     diabetic neuropathy  . Diarrhea     has had c diff diarrhea  . Weakness   . Bruises easily   . PAD (peripheral artery disease)  07/28/2012    Angiography 08/2012: Right: 90% focal stenosis in external iliac artery, long occlusion of SFA with 3 vessel run off . s/p slef expanding stent placement to external iliac artery. Left: borderline significant disease in comon and external iliac arteries. Diffuse 70-90% in SFA with 3 vessel run off.   . Bilateral carotid artery disease   . Difficult intubation     PATIENT STATED HERNIATED 2 DISCS  HAD TO HAVE CERVICAL SURGERY  . CHF (congestive heart failure)   . Acute renal insufficiency     05/2011 admission (cr 1.54 at discharge), WILL START HD IN FALL    Past Surgical History  Procedure Laterality Date  . Coronary artery bypass graft  04/06/2011    Procedure: CORONARY ARTERY BYPASS GRAFTING (CABG);  Surgeon: Loreli Slot, MD;  Location: [facility] OR;  Service: Open Heart Surgery;  Laterality: N/A;  Coronary Artery Bypass Graft times four utilizing the left internal mammary artery and the Right and left  greater Saphenous veins Harvested endoscopically.  . Fibroid bypass 04/06/11    . Colon surgery      rectal fissure  . Cervical disc surgery  1997 - approximate  . Colonoscopy  07/29/2011    Procedure: COLONOSCOPY;  Surgeon: Rachael Fee, MD;  Location: [facility] ENDOSCOPY;  Service: Endoscopy;  Laterality: N/A;  . Eye surgery  2011    cataract removal  . Left heart catheterization with coronary angiogram N/A 04/06/2011    Procedure: LEFT HEART  CATHETERIZATION WITH CORONARY ANGIOGRAM;  Surgeon: Iran Ouch, MD;  Location: [facility] CATH LAB;  Service: Cardiovascular;  Laterality: N/A;  . Abdominal aortagram N/A 08/02/2012    Procedure: ABDOMINAL Ronny Flurry;  Surgeon: Iran Ouch, MD;  Location: [facility] CATH LAB;  Service: Cardiovascular;  Laterality: N/A;  . Percutaneous stent intervention Left 08/02/2012    Procedure: PERCUTANEOUS STENT INTERVENTION;  Surgeon: Iran Ouch, MD;  Location: [facility] CATH LAB;  Service: Cardiovascular;  Laterality: Left;  stent x1 to lt ext iliac artery  .  Lower extremity angiogram Bilateral 08/02/2012    Procedure: LOWER EXTREMITY ANGIOGRAM;  Surgeon: Iran Ouch, MD;  Location: [facility] CATH LAB;  Service: Cardiovascular;  Laterality: Bilateral;  . Ercp      X2  . Endarterectomy Right 08/29/2014    Procedure: Attempted ENDARTERECTOMY CAROTID;  Surgeon: Nada Libman, MD;  Location: [facility] OR;  Service: Vascular;  Laterality: Right;    reports that he quit smoking about 3 years ago. His smoking use included Cigarettes. He smoked 0.50 packs per day. He has never used smokeless tobacco. He reports that he does not drink alcohol or use illicit drugs. family history includes Congestive Heart Failure in his father; Deep vein thrombosis in his sister; Diabetes in his brother, mother, and sister; Heart attack in his mother and sister; Heart disease in his brother, father, mother, and sister; Hyperlipidemia in his brother, mother, and sister; Hypertension in his mother and sister; Peripheral vascular disease in his mother. Allergies  Allergen Reactions  . Diltiazem Hcl Hives and Swelling  . Nifedipine Hives and Swelling      Review of Systems  Constitutional: Negative for fever and chills.  Respiratory: Negative for cough, shortness of breath and wheezing.   Cardiovascular: Negative for chest pain.  Gastrointestinal: Negative for abdominal pain.  Genitourinary: Negative for dysuria.  Musculoskeletal: Positive for back pain.  Neurological: Negative for dizziness and syncope.       Objective:   Physical Exam  Constitutional: He appears well-developed and well-nourished.  Neck: Neck supple.  Healing incision right neck with no signs of secondary infection. He has some swelling which they state is stable following hospital discharge  Cardiovascular: Normal rate and regular rhythm.   Pulmonary/Chest: Effort normal and breath sounds normal. No respiratory distress. He has no wheezes. He has no rales.  Abdominal: Soft. There is no tenderness.    Musculoskeletal: He exhibits no edema.          Assessment & Plan:  #1 recent PEA arrest intraoperatively for right carotid endarterectomy. Blood pressure stable and he has had no further problems since discharge. He has follow-up with cardiology scheduled #2 right periscapular pain. Nonfocal exam. Not reproducible and doubt rib fracture with no tenderness to palpation. Suspect musculoskeletal. Continue Percocet as needed. Repeat two-view chest film if symptoms persist #3 chronic kidney disease. Patient is awaiting AV fistula left upper extremity in anticipation of dialysis #4 hypertension stable and at goal

## 2014-09-03 NOTE — Patient Instructions (Signed)
Follow up for any fever, progressive shortness of breath, or any new symptoms such as hemoptysis

## 2014-09-04 ENCOUNTER — Encounter (HOSPITAL_COMMUNITY)
Admission: RE | Admit: 2014-09-04 | Discharge: 2014-09-04 | Disposition: A | Discharge status: Home / Self Care | Payer: Medicare Other | Source: Ambulatory Visit | Attending: Nephrology | Admitting: Nephrology

## 2014-09-04 DIAGNOSIS — D631 Anemia in chronic kidney disease: Secondary | ICD-10-CM | POA: Diagnosis present

## 2014-09-04 DIAGNOSIS — N183 Chronic kidney disease, stage 3 (moderate): Secondary | ICD-10-CM | POA: Insufficient documentation

## 2014-09-04 LAB — IRON AND TIBC
Iron: 35 ug/dL — ABNORMAL LOW (ref 45–182)
Saturation Ratios: 16 % — ABNORMAL LOW (ref 17.9–39.5)
TIBC: 224 ug/dL — ABNORMAL LOW (ref 250–450)
UIBC: 189 ug/dL

## 2014-09-04 LAB — FERRITIN: FERRITIN: 363 ng/mL — AB (ref 24–336)

## 2014-09-04 LAB — POCT HEMOGLOBIN-HEMACUE: Hemoglobin: 10.2 g/dL — ABNORMAL LOW (ref 13.0–17.0)

## 2014-09-04 MED ORDER — EPOETIN ALFA 3000 UNIT/ML IJ SOLN
INTRAMUSCULAR | Status: AC
  Filled 2014-09-04: qty 1

## 2014-09-04 MED ORDER — EPOETIN ALFA 3000 UNIT/ML IJ SOLN
3000.0000 [IU] | INTRAMUSCULAR | Status: DC
  Administered 2014-09-04: 3000 [IU] via SUBCUTANEOUS

## 2014-09-10 ENCOUNTER — Other Ambulatory Visit (HOSPITAL_COMMUNITY): Payer: Self-pay | Admitting: *Deleted

## 2014-09-11 ENCOUNTER — Encounter (HOSPITAL_COMMUNITY)
Admission: RE | Admit: 2014-09-11 | Discharge: 2014-09-11 | Disposition: A | Discharge status: Home / Self Care | Payer: Medicare Other | Source: Ambulatory Visit | Attending: Nephrology | Admitting: Nephrology

## 2014-09-11 DIAGNOSIS — D631 Anemia in chronic kidney disease: Secondary | ICD-10-CM | POA: Diagnosis not present

## 2014-09-11 LAB — POCT HEMOGLOBIN-HEMACUE: Hemoglobin: 11.4 g/dL — ABNORMAL LOW (ref 13.0–17.0)

## 2014-09-11 MED ORDER — EPOETIN ALFA 10000 UNIT/ML IJ SOLN
INTRAMUSCULAR | Status: AC
  Filled 2014-09-11: qty 1

## 2014-09-11 MED ORDER — EPOETIN ALFA 10000 UNIT/ML IJ SOLN
5000.0000 [IU] | INTRAMUSCULAR | Status: DC
  Administered 2014-09-11: 5000 [IU] via SUBCUTANEOUS

## 2014-09-13 ENCOUNTER — Encounter: Payer: Self-pay | Admitting: Surgery

## 2014-09-16 ENCOUNTER — Encounter: Payer: Self-pay | Admitting: Surgery

## 2014-09-16 ENCOUNTER — Ambulatory Visit (INDEPENDENT_AMBULATORY_CARE_PROVIDER_SITE_OTHER): Payer: Medicare Other | Admitting: Surgery

## 2014-09-16 ENCOUNTER — Ambulatory Visit (INDEPENDENT_AMBULATORY_CARE_PROVIDER_SITE_OTHER)
Admission: RE | Admit: 2014-09-16 | Discharge: 2014-09-16 | Disposition: A | Discharge status: Home / Self Care | Payer: Medicare Other | Source: Ambulatory Visit | Attending: Surgery | Admitting: Surgery

## 2014-09-16 ENCOUNTER — Ambulatory Visit (HOSPITAL_COMMUNITY)
Admission: RE | Admit: 2014-09-16 | Discharge: 2014-09-16 | Disposition: A | Discharge status: Home / Self Care | Payer: Medicare Other | Source: Ambulatory Visit | Attending: Surgery | Admitting: Surgery

## 2014-09-16 VITALS — BP 162/82 | HR 70 | Temp 98.2°F | Resp 16 | Ht 68.5 in | Wt 200.0 lb

## 2014-09-16 DIAGNOSIS — Z01810 Encounter for preprocedural cardiovascular examination: Secondary | ICD-10-CM | POA: Insufficient documentation

## 2014-09-16 DIAGNOSIS — N184 Chronic kidney disease, stage 4 (severe): Secondary | ICD-10-CM

## 2014-09-16 NOTE — Progress Notes (Signed)
Filed Vitals:   09/16/14 1343 09/16/14 1350  BP: 157/95 162/82  Pulse: 72 70  Temp: 98.2 F (36.8 C)   TempSrc: Oral   Resp: 16   Height: 5' 8.5" (1.74 m)   Weight: 200 lb (90.719 kg)   SpO2: 97%

## 2014-09-16 NOTE — Progress Notes (Signed)
Patient name: Kurt Richard MRN: [id number] DOB: October 30, 1945 Sex: male     Chief Complaint  Patient presents with  . New Evaluation    Ref. by Dr. Lajuana Carry , C/O CKD, Stage IV  Eval for   AVF placement     HISTORY OF PRESENT ILLNESS: The patient comes in today for evaluation of chronic renal insufficiency.  He is right-hand dominant.  He is not yet on dialysis.  His renal failure secondary to hypertension and diabetes.  The patient recently underwent an attempt at right carotid endarterectomy.  The case was aborted early on before the carotid was exposed because he lost a pulse and required approximately 5 minutes of chest compressions.  He was ultimately resuscitated and recovered from his operation without any significant neurologic deficit.  The exact underlying cause of his cardiac arrest is unclear at this time.  The patient suffers from hypercholesterolemia which is managed with a statin. He is a diet-controlled diabetic with hemoglobin A1c is in the 6 range. He has stage IV renal insufficiency and is being evaluated for dialysis in the next month. He takes injections for anemia. He is a former smoker having quit 3 years ago. He suffers from peripheral vascular disease and has undergone stenting in his left lower extremity 2 years ago. He is on Plavix for this. He also has coronary artery disease. He has never had a heart attack but did have CABG in 2013. He states that around the time of his CABG he had a CT scan for dysarthria which showed a chronic right PCA infarct. The patient has chronic diarrhea which is very severe.   Past Medical History  Diagnosis Date  . Diabetes mellitus   . Hypertension   . Chronic systolic heart failure     echo 04/12/11: Mild LVH, EF 30-35%, mid to distal anteroseptal and apical hypokinesis, grade 2 diastolic dysfunction, moderate LAE, mild RAE.  [name] Hyperlipidemia   . Tobacco abuse   . CAD (coronary artery disease)     acute anterior  STEMI, late presentation 04/06/11 LHC 3/5 demonstrated severe ostial left main 95% stenosis and otherwise three-vessel CAD with an ejection fraction of 15%.  Emergent CABG: Dr. Dorris Fetch - Grafts: LIMA-LAD, SVG-D1, SVG-OM1, SVG-PDA and PL.  . Ischemic cardiomyopathy   . DM2 (diabetes mellitus, type 2)   . 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 Anemia     Post-CABG  . Arthritis   . Stroke   . Neuromuscular disorder     diabetic neuropathy  . Diarrhea     has had c diff diarrhea  . Weakness   . Bruises easily   . PAD (peripheral artery disease) 07/28/2012    Angiography 08/2012: Right: 90% focal stenosis in external iliac artery, long occlusion of SFA with 3 vessel run off . s/p slef expanding stent placement to external iliac artery. Left: borderline significant disease in comon and external iliac arteries. Diffuse 70-90% in SFA with 3 vessel run off.   . Bilateral carotid artery disease   . Difficult intubation     PATIENT STATED HERNIATED 2 DISCS @[facility]  HAD TO HAVE CERVICAL SURGERY  . CHF (congestive heart failure)   . Acute renal insufficiency     05/2011 admission (cr 1.54 at discharge), WILL START HD IN [place]     Past Surgical History  Procedure Laterality Date  . Coronary artery bypass graft  04/06/2011    Procedure: CORONARY ARTERY BYPASS GRAFTING (  CABG);  Surgeon: Loreli Slot, MD;  Location: [facility] OR;  Service: Open Heart Surgery;  Laterality: N/A;  Coronary Artery Bypass Graft times four utilizing the left internal mammary artery and the Right and left  greater Saphenous veins Harvested endoscopically.  . Fibroid bypass 04/06/11    . Colon surgery      rectal fissure  . Cervical disc surgery  1997 - approximate  . Colonoscopy  07/29/2011    Procedure: COLONOSCOPY;  Surgeon: Rachael Fee, MD;  Location: [facility] ENDOSCOPY;  Service: Endoscopy;  Laterality: N/A;  . Eye surgery  2011    cataract removal  . Left heart catheterization with coronary angiogram N/A 04/06/2011     Procedure: LEFT HEART CATHETERIZATION WITH CORONARY ANGIOGRAM;  Surgeon: Iran Ouch, MD;  Location: [facility] CATH LAB;  Service: Cardiovascular;  Laterality: N/A;  . Abdominal aortagram N/A 08/02/2012    Procedure: ABDOMINAL Ronny Flurry;  Surgeon: Iran Ouch, MD;  Location: [facility] CATH LAB;  Service: Cardiovascular;  Laterality: N/A;  . Percutaneous stent intervention Left 08/02/2012    Procedure: PERCUTANEOUS STENT INTERVENTION;  Surgeon: Iran Ouch, MD;  Location: [facility] CATH LAB;  Service: Cardiovascular;  Laterality: Left;  stent x1 to lt ext iliac artery  . Lower extremity angiogram Bilateral 08/02/2012    Procedure: LOWER EXTREMITY ANGIOGRAM;  Surgeon: Iran Ouch, MD;  Location: [facility] CATH LAB;  Service: Cardiovascular;  Laterality: Bilateral;  . Ercp      X2  . Endarterectomy Right 08/29/2014    Procedure: Attempted ENDARTERECTOMY CAROTID;  Surgeon: Nada Libman, MD;  Location: [facility] OR;  Service: Vascular;  Laterality: Right;    Social History   Social History  . Marital Status: Single    Spouse Name: N/A  . Number of Children: 0  . Years of Education: N/A   Occupational History  . Not on file.   Social History Main Topics  . Smoking status: Former Smoker -- 0.50 packs/day    Types: Cigarettes    Quit date: 04/06/2011  . Smokeless tobacco: Never Used  . Alcohol Use: No  . Drug Use: No  . Sexual Activity: No   Other Topics Concern  . Not on file   Social History Narrative    Family History  Problem Relation Age of Onset  . Heart attack Mother     ?63s  . Diabetes Mother   . Heart disease Mother   . Hyperlipidemia Mother   . Hypertension Mother   . Peripheral vascular disease Mother   . Heart attack Sister     11s  . Deep vein thrombosis Sister   . Diabetes Sister     Dietitian  . Heart disease Sister   . Hyperlipidemia Sister   . Hypertension Sister   . Congestive Heart Failure Father   . Heart disease Father   . Diabetes Brother   . Heart disease Brother    . Hyperlipidemia Brother     Allergies as of 09/16/2014 - Review Complete 09/16/2014  Allergen Reaction Noted  . Diltiazem hcl Hives and Swelling 04/06/2011  . Nifedipine Hives and Swelling 04/06/2011    Current Outpatient Prescriptions on File Prior to Visit  Medication Sig Dispense Refill  . aspirin 81 MG EC tablet Take 1 tablet (81 mg total) by mouth daily.    [name] atorvastatin (LIPITOR) 20 MG tablet TAKE 1 TABLET EVERY DAY 30 tablet 5  . calcitRIOL (ROCALTROL) 0.25 MCG capsule Take 0.25 mcg by mouth daily.     [name]  carvedilol (COREG) 25 MG tablet TAKE 1 TABLET BY MOUTH TWICE A DAY WITH A MEAL 60 tablet 2  . clopidogrel (PLAVIX) 75 MG tablet TAKE 1 TABLET EVERY DAY 30 tablet 6  . diphenoxylate-atropine (LOMOTIL) 2.5-0.025 MG per tablet TAKE 1 TABLET BY MOUTH 4 TIMES A DAY AS NEEDED FOR DIARRHEA 90 tablet 0  . epoetin alfa (EPOGEN,PROCRIT) 3000 UNIT/ML injection Inject 3,000 Units into the skin once. Take on wednesdays    . fluticasone (FLONASE) 50 MCG/ACT nasal spray Place 2 sprays into the nose daily.    . furosemide (LASIX) 40 MG tablet TAKE ONE TABLET DAILY AND MAY TAKE ONE EXTRA AS NEEDED 110 tablet 3  . furosemide (LASIX) 40 MG tablet Take 40 mg by mouth daily as needed for fluid.    [name] glucose blood (ONETOUCH VERIO) test strip Use as instructed. DX: 250.00 100 each 3  . hydrALAZINE (APRESOLINE) 25 MG tablet Take 1 tablet (25 mg total) by mouth 3 (three) times daily. 270 tablet 3  . isosorbide dinitrate (ISORDIL) 20 MG tablet Take 1 tablet (20 mg total) by mouth 3 (three) times daily. 270 tablet 3  . nitroGLYCERIN (NITROSTAT) 0.4 MG SL tablet Place 1 tablet (0.4 mg total) under the tongue every 5 (five) minutes as needed for chest pain. 25 tablet 0  . ONETOUCH DELICA LANCETS FINE MISC Use as instructed. DX: 250.00 100 each 3  . Opium Tincture, Paregoric, 2 MG/5ML TINC TAKE 1 TEASPOONFUL BY MOUTH 4 TIMES A DAY AS NEEDED FOR DIARRHEA OR LOOSE STOOLS 120 mL 2  . oxyCODONE-acetaminophen  (PERCOCET/ROXICET) 5-325 MG per tablet Take 1 tablet by mouth every 6 (six) hours as needed. 30 tablet 0  . sodium bicarbonate 650 MG tablet Take 1,300 mg by mouth 2 (two) times daily.  12   No current facility-administered medications on file prior to visit.     REVIEW OF SYSTEMS: Cardiovascular: Please see history of present illness positive for phlebitis Pulmonary: No productive cough, asthma or wheezing. Neurologic: No weakness, paresthesias, aphasia, or amaurosis. No dizziness. Hematologic: No bleeding problems or clotting disorders. Musculoskeletal: No joint pain or joint swelling. Gastrointestinal: Chronic diarrhea Genitourinary: No dysuria or hematuria. Psychiatric:: No history of major depression. Integumentary: No rashes or ulcers. Constitutional: No fever or chills.  PHYSICAL EXAMINATION:   Vital signs are  Filed Vitals:   09/16/14 1343 09/16/14 1350  BP: 157/95 162/82  Pulse: 72 70  Temp: 98.2 F (36.8 C)   TempSrc: Oral   Resp: 16   Height: 5' 8.5" (1.74 m)   Weight: 200 lb (90.719 kg)   SpO2: 97%    Body mass index is 29.96 kg/(m^2). General: The patient appears their stated age. HEENT:  No gross abnormalities Pulmonary:  Non labored breathing Abdomen: Soft and non-tender Musculoskeletal: There are no major deformities. Neurologic: No focal weakness or paresthesias are detected, Skin: There are no ulcer or rashes noted. Psychiatric: The patient has normal affect. Cardiovascular: There is a regular rate and rhythm without significant murmur appreciated.  Palpable radial pulse   Diagnostic Studies I reviewed his arterial and venous Dopplers.  Brachial artery bifurcations are in the antecubital fossa level.  Biphasic waveforms on the left and triphasic on the right.  His vein mapping shows adequate cephalic vein bilaterally.  Assessment: #1: Chronic renal insufficiency #2: Carotid stenosis Plan: #1: The patient's purposes for his visit today is chronic  renal insufficiency.  However given the recent events during his attempted carotid endarterectomy, I told the patient  that I would not taken to the operating room for at least 3 months to do a renal access procedure.  When that time comes, I feel that he would be a candidate for a left arm fistula.  #2: From a carotid standpoint he is recovered from his operation.  I am reluctant to take him back to the operating room anytime soon.  We discussed the possibility of carotid stenting, however this is somewhat tenuous given his renal function.  We will address all these issues when he returns in 3 months.  Jorge Ny, M.D. [facility] Office: [phone] Pager:  [phone]

## 2014-10-03 ENCOUNTER — Other Ambulatory Visit (INDEPENDENT_AMBULATORY_CARE_PROVIDER_SITE_OTHER): Payer: Medicare Other | Admitting: *Deleted

## 2014-10-03 DIAGNOSIS — I159 Secondary hypertension, unspecified: Secondary | ICD-10-CM | POA: Diagnosis not present

## 2014-10-03 DIAGNOSIS — I2510 Atherosclerotic heart disease of native coronary artery without angina pectoris: Secondary | ICD-10-CM | POA: Diagnosis not present

## 2014-10-03 LAB — BASIC METABOLIC PANEL
BUN: 68 mg/dL — ABNORMAL HIGH (ref 6–23)
CALCIUM: 8.6 mg/dL (ref 8.4–10.5)
CO2: 22 meq/L (ref 19–32)
Chloride: 113 mEq/L — ABNORMAL HIGH (ref 96–112)
Creatinine, Ser: 3.19 mg/dL — ABNORMAL HIGH (ref 0.40–1.50)
GFR: 20.74 mL/min — ABNORMAL LOW (ref >60.00)
GLUCOSE: 95 mg/dL (ref 70–99)
Potassium: 5.6 mEq/L — ABNORMAL HIGH (ref 3.5–5.1)
Sodium: 144 mEq/L (ref 135–145)

## 2014-10-03 LAB — HEPATIC FUNCTION PANEL
ALBUMIN: 3.8 g/dL (ref 3.5–5.2)
ALK PHOS: 85 U/L (ref 39–117)
ALT: 7 U/L (ref 0–53)
AST: 9 U/L (ref 0–37)
Bilirubin, Direct: 0.1 mg/dL (ref 0.0–0.3)
TOTAL PROTEIN: 6.7 g/dL (ref 6.0–8.3)
Total Bilirubin: 0.5 mg/dL (ref 0.2–1.2)

## 2014-10-03 LAB — LIPID PANEL
Cholesterol: 105 mg/dL (ref 0–200)
HDL: 27.1 mg/dL — ABNORMAL LOW (ref >39.00)
LDL Cholesterol: 61 mg/dL (ref 0–99)
NonHDL: 77.64
Total CHOL/HDL Ratio: 4
Triglycerides: 82 mg/dL (ref 0.0–149.0)
VLDL: 16.4 mg/dL (ref 0.0–40.0)

## 2014-10-09 ENCOUNTER — Encounter: Payer: Self-pay | Admitting: Cardiovascular Disease

## 2014-10-09 ENCOUNTER — Encounter (HOSPITAL_COMMUNITY)
Admission: RE | Admit: 2014-10-09 | Discharge: 2014-10-09 | Disposition: A | Discharge status: Home / Self Care | Payer: Medicare Other | Source: Ambulatory Visit | Attending: Nephrology | Admitting: Nephrology

## 2014-10-09 ENCOUNTER — Ambulatory Visit (INDEPENDENT_AMBULATORY_CARE_PROVIDER_SITE_OTHER): Payer: Medicare Other | Admitting: Cardiovascular Disease

## 2014-10-09 VITALS — BP 192/82 | HR 80 | Ht 68.5 in | Wt 203.0 lb

## 2014-10-09 DIAGNOSIS — I2510 Atherosclerotic heart disease of native coronary artery without angina pectoris: Secondary | ICD-10-CM | POA: Diagnosis not present

## 2014-10-09 DIAGNOSIS — D631 Anemia in chronic kidney disease: Secondary | ICD-10-CM | POA: Insufficient documentation

## 2014-10-09 DIAGNOSIS — N183 Chronic kidney disease, stage 3 (moderate): Secondary | ICD-10-CM | POA: Insufficient documentation

## 2014-10-09 DIAGNOSIS — I10 Essential (primary) hypertension: Secondary | ICD-10-CM

## 2014-10-09 DIAGNOSIS — I5022 Chronic systolic (congestive) heart failure: Secondary | ICD-10-CM

## 2014-10-09 LAB — IRON AND TIBC
IRON: 42 ug/dL — AB (ref 45–182)
Saturation Ratios: 16 % — ABNORMAL LOW (ref 17.9–39.5)
TIBC: 262 ug/dL (ref 250–450)
UIBC: 220 ug/dL

## 2014-10-09 LAB — FERRITIN: Ferritin: 352 ng/mL — ABNORMAL HIGH (ref 24–336)

## 2014-10-09 LAB — POCT HEMOGLOBIN-HEMACUE: Hemoglobin: 10.2 g/dL — ABNORMAL LOW (ref 13.0–17.0)

## 2014-10-09 MED ORDER — CARVEDILOL 25 MG PO TABS: 25.0000 mg | ORAL_TABLET | Freq: Two times a day (BID) | ORAL | Status: DC

## 2014-10-09 MED ORDER — EPOETIN ALFA 10000 UNIT/ML IJ SOLN
5000.0000 [IU] | INTRAMUSCULAR | Status: DC
  Administered 2014-10-09: 5000 [IU] via SUBCUTANEOUS

## 2014-10-09 MED ORDER — NITROGLYCERIN 0.4 MG SL SUBL: 0.4000 mg | SUBLINGUAL_TABLET | SUBLINGUAL | Status: AC | PRN

## 2014-10-09 MED ORDER — EPOETIN ALFA 10000 UNIT/ML IJ SOLN
INTRAMUSCULAR | Status: AC
  Filled 2014-10-09: qty 1

## 2014-10-09 MED ORDER — CLOPIDOGREL BISULFATE 75 MG PO TABS: 75.0000 mg | ORAL_TABLET | Freq: Every day | ORAL | Status: AC

## 2014-10-09 NOTE — Patient Instructions (Signed)
Medication Instructions:  Your physician recommends that you continue on your current medications as directed. Please refer to the Current Medication list given to you today.   Labwork: None Ordered   Testing/Procedures: None Ordered   Follow-Up: Your physician recommends that you schedule a follow-up appointment in: 3 months with Dr. Nahser      

## 2014-10-09 NOTE — Progress Notes (Signed)
Cardiology Office Note   Date:  10/09/2014   ID:  Kurt Richard, DOB [birth year], MRN [id number]  PCP:  Kristian Covey, MD  Cardiologist:   [name], MD   Chief Complaint  Patient presents with  . Follow-up    CHF, CAD   Problem list: 1. Acute on chronic systolic CHF  Echo Feb. 2014 Left ventricle: The cavity size was normal. moderate LVH. The estimated ejection fraction was in the range of 35% to 40%. There is moderate hypokinesis of the mid-distalanteroseptal myocardium. pseudonormal left ventricular filling pattern, with concomitant abnormal relaxation and increased filling pressure (grade 2 diastolic dysfunction). - Aortic valve: There was very mild stenosis. Mean gradient:71mm Hg (S). Peak gradient: 13mm Hg (S). - Mitral valve: Calcified annulus. Mildly thickened leaflets .- Left atrium: The atrium was moderately dilated. - Right ventricle: The cavity size was mildly dilated. - Right atrium: The atrium was mildly dilated.  2. C. Diff colitis. 3. Acute renal insufficiency, d/c Cr 1.54  4. CAD - recent late-presenting anterior MI 04/2011 with 3V dz s/p CABG  - postop course 04/2011 complicated by LV dysfunction requiring IABP/inotropes, ABL anemia, CVA  5. 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 Diabetes mellitus  7. Tobacco abuse  8. hypertension 9. hyperlipidemia   Kurt Richard is a 69 year old gentleman with the above-noted medical history.  He is still having problems with his abdomen and the diarrhea. The last stool sample was negative for c. Diff. He needs to have another ERCP at [facility]. He has done well from a cardiac standpoint. No further exacerbations of CHf. No angina.  April 13, 2012: Kurt Richard is doing better. He saw [name] for pre-op evaluation prior gallbladder surgery. He had an echocardiogram which revealed some improvement of his left ventricular systolic function. His ejection fraction is now 35-40%. His preop labs also revealed that he  has chronic renal insufficiency with a creatinine of 2.3. His BUN is also elevated. In addition, he also has anemia. His Lasix was decreased to 40 mg 3 times a week after seeing his elevated creatinine. His breathing remains the same. He occasionally will take an extra Lasix if he develops chest congestion.  Jun 29, 2012:  He's not having episodes of chest pain or shortness of breath. [name] is making some slow progress. He is not able to exercise because of left calf pain. He is only able to stand on his legs for several minutes.  He also has neuropathy which limits his exercise. He has not been eating any extra salt.  His baseline creatinine is 2.2. He does not want to start on dialysis.   Sept. 5, 2014:  He has done well. His Hb is still low. He goes for Procrit injection every week and iron infusions on occasion. No obvious blood in his stool.  He has had peripheral arterial evaluation and stenting of his left iliac artery:  Conclusions:  1. Severe focal stenosis in the distal left external iliac artery. Moderate to significant diffuse disease in the distal right common iliac artery and right external iliac artery.  2. Occluded left SFA in a long segment with collaterals from the profunda. Three-vessel runoff below the knee.  3. Diffusely diseased right SFA throughout its course with three-vessel runoff below the knee.  4. Successful self-expanding stent placement to the left external iliac artery.  He was started back on the plavix.  He still eats an unrestricted diet - still eats carbs and fats as well as some extra salt.  Dec. 4, 2014:  Kurt Richard is doing well. BP at home is normal. No CP or dyspnea.  He's not eating much salt. He avoids hot dogs, and other salty foods. He has seen Dr. Hyman Hopes recently. He received Procrit weekly. They have discussed starting dialysis.   July 06, 2013:  Kurt Richard is doing ok he's been having some leg edema. He doubled up on his  Lasix for about a week and his leg edema has almost completely resolved. He's having problems with anemia. His hemoglobin is now finally 11.1.  Sept. 8, 2015:  Kurt Richard has been doing well from a CHF standpoint He has seen Dr. Kirke Corin who is considering a stent in right leg for PVD He is still battling anemia.   He still has diarrhea - typically after eating. Not related to lactose, He has been tested for Celiac ( negative)   April 12, 2014:  Kurt Richard is a 69 y.o. male who presents for follow up of his CHF and HTN., Has gained some weight.   Has been eating mail order desserts from [place] .  Sept. 7, 2016:  Eating lots of fast foods.  Almost every meal .   BP has been high. Has not taking  Was going to have carotid surgery 4 weeks ago   , the incision was made, had a brief cardiac arrest - likely related to anesthesia   Had CPR for 5 minutes  Has made a nice recovery . Had some confusion for about a week .    Past Medical History  Diagnosis Date  . Diabetes mellitus   . Hypertension   . Chronic systolic heart failure     echo 04/12/11: Mild LVH, EF 30-35%, mid to distal anteroseptal and apical hypokinesis, grade 2 diastolic dysfunction, moderate LAE, mild RAE.  [name] Hyperlipidemia   . Tobacco abuse   . CAD (coronary artery disease)     acute anterior STEMI, late presentation 04/06/11 LHC 3/5 demonstrated severe ostial left main 95% stenosis and otherwise three-vessel CAD with an ejection fraction of 15%.  Emergent CABG: Dr. Dorris Fetch - Grafts: LIMA-LAD, SVG-D1, SVG-OM1, SVG-PDA and PL.  . Ischemic cardiomyopathy   . DM2 (diabetes mellitus, type 2)   . 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 Anemia     Post-CABG  . Arthritis   . Stroke   . Neuromuscular disorder     diabetic neuropathy  . Diarrhea     has had c diff diarrhea  . Weakness   . Bruises easily   . PAD (peripheral artery disease) 07/28/2012    Angiography 08/2012: Right: 90% focal stenosis in  external iliac artery, long occlusion of SFA with 3 vessel run off . s/p slef expanding stent placement to external iliac artery. Left: borderline significant disease in comon and external iliac arteries. Diffuse 70-90% in SFA with 3 vessel run off.   . Bilateral carotid artery disease   . Difficult intubation     PATIENT STATED HERNIATED 2 DISCS @[facility]  HAD TO HAVE CERVICAL SURGERY  . CHF (congestive heart failure)   . Acute renal insufficiency     05/2011 admission (cr 1.54 at discharge), WILL START HD IN [place]     Past Surgical History  Procedure Laterality Date  . Coronary artery bypass graft  04/06/2011    Procedure: CORONARY ARTERY BYPASS GRAFTING (CABG);  Surgeon: Loreli Slot, MD;  Location: [facility] OR;  Service: Open Heart Surgery;  Laterality: N/A;  Coronary Artery  Bypass Graft times four utilizing the left internal mammary artery and the Right and left  greater Saphenous veins Harvested endoscopically.  . Fibroid bypass 04/06/11    . Colon surgery      rectal fissure  . Cervical disc surgery  1997 - approximate  . Colonoscopy  07/29/2011    Procedure: COLONOSCOPY;  Surgeon: Rachael Fee, MD;  Location: [facility] ENDOSCOPY;  Service: Endoscopy;  Laterality: N/A;  . Eye surgery  2011    cataract removal  . Left heart catheterization with coronary angiogram N/A 04/06/2011    Procedure: LEFT HEART CATHETERIZATION WITH CORONARY ANGIOGRAM;  Surgeon: Iran Ouch, MD;  Location: [facility] CATH LAB;  Service: Cardiovascular;  Laterality: N/A;  . Abdominal aortagram N/A 08/02/2012    Procedure: ABDOMINAL Ronny Flurry;  Surgeon: Iran Ouch, MD;  Location: [facility] CATH LAB;  Service: Cardiovascular;  Laterality: N/A;  . Percutaneous stent intervention Left 08/02/2012    Procedure: PERCUTANEOUS STENT INTERVENTION;  Surgeon: Iran Ouch, MD;  Location: [facility] CATH LAB;  Service: Cardiovascular;  Laterality: Left;  stent x1 to lt ext iliac artery  . Lower extremity angiogram Bilateral 08/02/2012    Procedure: LOWER  EXTREMITY ANGIOGRAM;  Surgeon: Iran Ouch, MD;  Location: [facility] CATH LAB;  Service: Cardiovascular;  Laterality: Bilateral;  . Ercp      X2  . Endarterectomy Right 08/29/2014    Procedure: Attempted ENDARTERECTOMY CAROTID;  Surgeon: Nada Libman, MD;  Location: [facility] OR;  Service: Vascular;  Laterality: Right;     Current Outpatient Prescriptions  Medication Sig Dispense Refill  . aspirin 81 MG EC tablet Take 1 tablet (81 mg total) by mouth daily.    [name] atorvastatin (LIPITOR) 20 MG tablet TAKE 1 TABLET EVERY DAY 30 tablet 5  . calcitRIOL (ROCALTROL) 0.25 MCG capsule Take 0.25 mcg by mouth daily.     . carvedilol (COREG) 25 MG tablet TAKE 1 TABLET BY MOUTH TWICE A DAY WITH A MEAL 60 tablet 2  . clopidogrel (PLAVIX) 75 MG tablet TAKE 1 TABLET EVERY DAY 30 tablet 6  . diphenoxylate-atropine (LOMOTIL) 2.5-0.025 MG per tablet TAKE 1 TABLET BY MOUTH 4 TIMES A DAY AS NEEDED FOR DIARRHEA 90 tablet 0  . epoetin alfa (EPOGEN,PROCRIT) 3000 UNIT/ML injection Inject 3,000 Units into the skin once. Take on wednesdays    . fluticasone (FLONASE) 50 MCG/ACT nasal spray Place 2 sprays into the nose daily.    . furosemide (LASIX) 40 MG tablet TAKE ONE TABLET DAILY AND MAY TAKE ONE EXTRA AS NEEDED 110 tablet 3  . furosemide (LASIX) 40 MG tablet Take 40 mg by mouth daily as needed for fluid.    [name] glucose blood (ONETOUCH VERIO) test strip Use as instructed. DX: 250.00 100 each 3  . hydrALAZINE (APRESOLINE) 25 MG tablet Take 1 tablet (25 mg total) by mouth 3 (three) times daily. 270 tablet 3  . isosorbide dinitrate (ISORDIL) 20 MG tablet Take 1 tablet (20 mg total) by mouth 3 (three) times daily. 270 tablet 3  . nitroGLYCERIN (NITROSTAT) 0.4 MG SL tablet Place 1 tablet (0.4 mg total) under the tongue every 5 (five) minutes as needed for chest pain. 25 tablet 0  . ONETOUCH DELICA LANCETS FINE MISC Use as instructed. DX: 250.00 100 each 3  . Opium Tincture, Paregoric, 2 MG/5ML TINC TAKE 1 TEASPOONFUL BY MOUTH 4  TIMES A DAY AS NEEDED FOR DIARRHEA OR LOOSE STOOLS 120 mL 2  . oxyCODONE-acetaminophen (PERCOCET/ROXICET) 5-325 MG per tablet Take 1  tablet by mouth every 6 (six) hours as needed. 30 tablet 0  . sodium bicarbonate 650 MG tablet Take 1,300 mg by mouth 2 (two) times daily.  12   No current facility-administered medications for this visit.    Allergies:   Diltiazem hcl and Nifedipine    Social History:  The patient  reports that he quit smoking about 3 years ago. His smoking use included Cigarettes. He smoked 0.50 packs per day. He has never used smokeless tobacco. He reports that he does not drink alcohol or use illicit drugs.   Family History:  The patient's family history includes Congestive Heart Failure in his father; Deep vein thrombosis in his sister; Diabetes in his brother, mother, and sister; Heart attack in his mother and sister; Heart disease in his brother, father, mother, and sister; Hyperlipidemia in his brother, mother, and sister; Hypertension in his mother and sister; Peripheral vascular disease in his mother.    ROS:  Please see the history of present illness.    Review of Systems: Constitutional:  denies fever, chills, diaphoresis, appetite change and fatigue.  HEENT: denies photophobia, eye pain, redness, hearing loss, ear pain, congestion, sore throat, rhinorrhea, sneezing, neck pain, neck stiffness and tinnitus.  Respiratory: denies SOB, DOE, cough, chest tightness, and wheezing.  Cardiovascular: denies chest pain, palpitations and leg swelling.  Gastrointestinal: denies nausea, vomiting, abdominal pain, diarrhea, constipation, blood in stool.  Genitourinary: denies dysuria, urgency, frequency, hematuria, flank pain and difficulty urinating.  Musculoskeletal: denies  myalgias, back pain, joint swelling, arthralgias and gait problem.   Skin: denies pallor, rash and wound.  Neurological: denies dizziness, seizures, syncope, weakness, light-headedness, numbness and  headaches.   Hematological: denies adenopathy, easy bruising, personal or family bleeding history.  Psychiatric/ Behavioral: denies suicidal ideation, mood changes, confusion, nervousness, sleep disturbance and agitation.       All other systems are reviewed and negative.    PHYSICAL EXAM: VS:  BP 192/82 mmHg  Pulse 80  Ht 5' 8.5" (1.74 m)  Wt 92.08 kg (203 lb)  BMI 30.41 kg/m2  SpO2 90% , BMI Body mass index is 30.41 kg/(m^2). GEN: Well nourished, well developed, in no acute distress HEENT: normal Neck: no JVD, carotid bruits, or masses Cardiac: RRR; no murmurs, rubs, or gallops,no edema  Respiratory:  clear to auscultation bilaterally, normal work of breathing GI: soft, nontender, nondistended, + BS MS: no deformity or atrophy Skin: warm and dry, no rash Neuro:  Strength and sensation are intact Psych: normal   EKG:  EKG is not ordered today.    Recent Labs: 06/18/2014: TSH 0.45 08/31/2014: Platelets 175 09/11/2014: Hemoglobin 11.4* 10/03/2014: ALT 7; BUN 68*; Creatinine, Ser 3.19*; Potassium 5.6*; Sodium 144    Lipid Panel    Component Value Date/Time   CHOL 105 10/03/2014 1010   TRIG 82.0 10/03/2014 1010   HDL 27.10* 10/03/2014 1010   CHOLHDL 4 10/03/2014 1010   VLDL 16.4 10/03/2014 1010   LDLCALC 61 10/03/2014 1010   LDLDIRECT 155.3 10/10/2012 1032      Wt Readings from Last 3 Encounters:  10/09/14 92.08 kg (203 lb)  09/16/14 90.719 kg (200 lb)  09/03/14 92.08 kg (203 lb)      Other studies Reviewed: Additional studies/ records that were reviewed today include: . Review of the above records demonstrates:    ASSESSMENT AND PLAN:  1. Acute on chronic systolic CHF  Echo Feb. 2014 Left ventricle: The cavity size was normal. moderate LVH. The estimated ejection fraction was in  the range of 35% to 40%. There is moderate hypokinesis of the mid-distalanteroseptal myocardium. pseudonormal left ventricular filling pattern, with concomitant  abnormal relaxation and increased filling pressure (grade 2 diastolic dysfunction). - Aortic valve: There was very mild stenosis. Mean gradient:35mm Hg (S). Peak gradient: 13mm Hg (S). - Mitral valve: Calcified annulus. Mildly thickened leaflets .- Left atrium: The atrium was moderately dilated. - Right ventricle: The cavity size was mildly dilated. - Right atrium: The atrium was mildly dilated.  2. C. Diff colitis. 3. Acute renal insufficiency, d/c Cr 1.54  4. CAD - recent late-presenting anterior MI 04/2011 with 3V dz s/p CABG  - postop course 04/2011 complicated by LV dysfunction requiring IABP/inotropes, ABL anemia, CVA  5. 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 Diabetes mellitus  7. Tobacco abuse -  He has stopped smoking   8. Hypertension - BP has been elevated.  Has been been eating more salt .  Encouraged him to make better decisions.  9. Hyperlipidemia -  We'll check fasting labs today. 10 Weight gain :  He has been eating lots of [employer] and [employer].   Encouraged lower carb diet and regular exercise.    Current medicines are reviewed at length with the patient today.  The patient does not have concerns regarding medicines.  The following changes have been made:  no change   Disposition:   FU with me in 6 months     Signed, Sriram Febles, Deloris Ping, MD  10/09/2014 9:11 AM    [facility] [address] [place], [place], [place]  [address] Phone: [phone]; Fax: [phone]

## 2014-10-14 ENCOUNTER — Other Ambulatory Visit: Payer: Medicare Other

## 2014-11-05 ENCOUNTER — Other Ambulatory Visit (HOSPITAL_COMMUNITY): Payer: Self-pay | Admitting: *Deleted

## 2014-11-05 ENCOUNTER — Other Ambulatory Visit: Payer: Self-pay | Admitting: Family Medicine

## 2014-11-05 NOTE — Telephone Encounter (Signed)
Rx was sent  

## 2014-11-06 ENCOUNTER — Encounter (HOSPITAL_COMMUNITY)
Admission: RE | Admit: 2014-11-06 | Discharge: 2014-11-06 | Disposition: A | Discharge status: Home / Self Care | Payer: Medicare Other | Source: Ambulatory Visit | Attending: Nephrology | Admitting: Nephrology

## 2014-11-06 DIAGNOSIS — N183 Chronic kidney disease, stage 3 (moderate): Secondary | ICD-10-CM | POA: Insufficient documentation

## 2014-11-06 DIAGNOSIS — D631 Anemia in chronic kidney disease: Secondary | ICD-10-CM | POA: Diagnosis present

## 2014-11-06 LAB — IRON AND TIBC
IRON: 48 ug/dL (ref 45–182)
Saturation Ratios: 18 % (ref 17.9–39.5)
TIBC: 262 ug/dL (ref 250–450)
UIBC: 214 ug/dL

## 2014-11-06 LAB — FERRITIN: Ferritin: 321 ng/mL (ref 24–336)

## 2014-11-06 LAB — POCT HEMOGLOBIN-HEMACUE: Hemoglobin: 9.9 g/dL — ABNORMAL LOW (ref 13.0–17.0)

## 2014-11-06 MED ORDER — SODIUM CHLORIDE 0.9 % IV SOLN
510.0000 mg | INTRAVENOUS | Status: DC
  Administered 2014-11-06: 510 mg via INTRAVENOUS
  Filled 2014-11-06: qty 17

## 2014-11-06 MED ORDER — EPOETIN ALFA 10000 UNIT/ML IJ SOLN
5000.0000 [IU] | INTRAMUSCULAR | Status: DC
  Administered 2014-11-06: 5000 [IU] via SUBCUTANEOUS

## 2014-11-06 MED ORDER — EPOETIN ALFA 10000 UNIT/ML IJ SOLN
INTRAMUSCULAR | Status: AC
  Filled 2014-11-06: qty 1

## 2014-11-09 ENCOUNTER — Other Ambulatory Visit: Payer: Self-pay | Admitting: Family Medicine

## 2014-11-13 ENCOUNTER — Other Ambulatory Visit: Payer: Self-pay | Admitting: Family Medicine

## 2014-11-13 ENCOUNTER — Encounter (HOSPITAL_COMMUNITY): Payer: Medicare Other

## 2014-11-14 ENCOUNTER — Other Ambulatory Visit (HOSPITAL_COMMUNITY): Payer: Self-pay | Admitting: *Deleted

## 2014-11-15 ENCOUNTER — Encounter (HOSPITAL_COMMUNITY)
Admission: RE | Admit: 2014-11-15 | Discharge: 2014-11-15 | Disposition: A | Discharge status: Home / Self Care | Payer: Medicare Other | Source: Ambulatory Visit | Attending: Nephrology | Admitting: Nephrology

## 2014-11-15 DIAGNOSIS — D509 Iron deficiency anemia, unspecified: Secondary | ICD-10-CM | POA: Diagnosis present

## 2014-11-15 MED ORDER — SODIUM CHLORIDE 0.9 % IV SOLN
510.0000 mg | Freq: Once | INTRAVENOUS | Status: AC
  Administered 2014-11-15: 510 mg via INTRAVENOUS
  Filled 2014-11-15: qty 17

## 2014-11-18 ENCOUNTER — Other Ambulatory Visit: Payer: Self-pay

## 2014-11-18 ENCOUNTER — Other Ambulatory Visit: Payer: Self-pay | Admitting: Family Medicine

## 2014-12-04 ENCOUNTER — Encounter (HOSPITAL_COMMUNITY)
Admission: RE | Admit: 2014-12-04 | Discharge: 2014-12-04 | Disposition: A | Discharge status: Home / Self Care | Payer: Medicare Other | Source: Ambulatory Visit | Attending: Nephrology | Admitting: Nephrology

## 2014-12-04 DIAGNOSIS — N183 Chronic kidney disease, stage 3 (moderate): Secondary | ICD-10-CM | POA: Diagnosis not present

## 2014-12-04 DIAGNOSIS — D631 Anemia in chronic kidney disease: Secondary | ICD-10-CM | POA: Diagnosis present

## 2014-12-04 LAB — IRON AND TIBC
Iron: 63 ug/dL (ref 45–182)
Saturation Ratios: 25 % (ref 17.9–39.5)
TIBC: 251 ug/dL (ref 250–450)
UIBC: 188 ug/dL

## 2014-12-04 LAB — FERRITIN: FERRITIN: 605 ng/mL — AB (ref 24–336)

## 2014-12-04 LAB — POCT HEMOGLOBIN-HEMACUE: Hemoglobin: 10.3 g/dL — ABNORMAL LOW (ref 13.0–17.0)

## 2014-12-04 MED ORDER — EPOETIN ALFA 10000 UNIT/ML IJ SOLN
5000.0000 [IU] | INTRAMUSCULAR | Status: DC
  Administered 2014-12-04: 5000 [IU] via SUBCUTANEOUS

## 2014-12-04 MED ORDER — EPOETIN ALFA 10000 UNIT/ML IJ SOLN
INTRAMUSCULAR | Status: AC
  Filled 2014-12-04: qty 1

## 2014-12-19 ENCOUNTER — Other Ambulatory Visit: Payer: Self-pay | Admitting: Family Medicine

## 2014-12-19 NOTE — Telephone Encounter (Signed)
Refill OK

## 2014-12-23 ENCOUNTER — Encounter: Payer: Self-pay | Admitting: Surgery

## 2014-12-23 ENCOUNTER — Ambulatory Visit: Payer: Medicare Other | Admitting: Surgery

## 2014-12-25 ENCOUNTER — Encounter: Payer: Self-pay | Admitting: Surgery

## 2014-12-25 ENCOUNTER — Ambulatory Visit (INDEPENDENT_AMBULATORY_CARE_PROVIDER_SITE_OTHER): Payer: Medicare Other | Admitting: Surgery

## 2014-12-25 VITALS — BP 139/63 | HR 68 | Temp 97.4°F | Resp 16 | Ht 68.0 in | Wt 199.0 lb

## 2014-12-25 DIAGNOSIS — N184 Chronic kidney disease, stage 4 (severe): Secondary | ICD-10-CM | POA: Diagnosis not present

## 2014-12-25 NOTE — Progress Notes (Signed)
Patient name: Kurt Richard MRN: [id number] DOB: Jan 17, 1946 Sex: male     Chief Complaint  Patient presents with  . Follow-up    3 month evaluation - per patient his kidneys are still functioning and Dr. Hyman Hopes has not said anymore about the need for HD access.      HISTORY OF PRESENT ILLNESS: The patient comes in today for  Follow-up. He is right-hand dominant. He is not yet on dialysis. His renal failure secondary to hypertension and diabetes.  The patient recently underwent an attempt at right carotid endarterectomy. The case was aborted early on before the carotid was exposed because he lost a pulse and required approximately 5 minutes of chest compressions. He was ultimately resuscitated and recovered from his operation without any significant neurologic deficit. The exact underlying cause of his cardiac arrest is unclear at this time.  The patient suffers from hypercholesterolemia which is managed with a statin. He is a diet-controlled diabetic with hemoglobin A1c is in the 6 range. He has stage IV renal insufficiency and is being evaluated for dialysis in the next month. He takes injections for anemia. He is a former smoker having quit 3 years ago. He suffers from peripheral vascular disease and has undergone stenting in his left lower extremity 2 years ago. He is on Plavix for this. He also has coronary artery disease. He has never had a heart attack but did have CABG in 2013. He states that around the time of his CABG he had a CT scan for dysarthria which showed a chronic right PCA infarct. The patient has chronic diarrhea which is very severe.  Past Medical History  Diagnosis Date  . Diabetes mellitus   . Hypertension   . Chronic systolic heart failure (HCC)     echo 04/12/11: Mild LVH, EF 30-35%, mid to distal anteroseptal and apical hypokinesis, grade 2 diastolic dysfunction, moderate LAE, mild RAE.  [name] Hyperlipidemia   . Tobacco abuse   . CAD (coronary artery  disease)     acute anterior STEMI, late presentation 04/06/11 LHC 3/5 demonstrated severe ostial left main 95% stenosis and otherwise three-vessel CAD with an ejection fraction of 15%.  Emergent CABG: Dr. Dorris Fetch - Grafts: LIMA-LAD, SVG-D1, SVG-OM1, SVG-PDA and PL.  . Ischemic cardiomyopathy   . DM2 (diabetes mellitus, type 2) (HCC)   . 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 Anemia     Post-CABG  . Arthritis   . Stroke (HCC)   . Neuromuscular disorder (HCC)     diabetic neuropathy  . Diarrhea     has had c diff diarrhea  . Weakness   . Bruises easily   . PAD (peripheral artery disease) (HCC) 07/28/2012    Angiography 08/2012: Right: 90% focal stenosis in external iliac artery, long occlusion of SFA with 3 vessel run off . s/p slef expanding stent placement to external iliac artery. Left: borderline significant disease in comon and external iliac arteries. Diffuse 70-90% in SFA with 3 vessel run off.   . Bilateral carotid artery disease (HCC)   . Difficult intubation     PATIENT STATED HERNIATED 2 DISCS  HAD TO HAVE CERVICAL SURGERY  . CHF (congestive heart failure) (HCC)   . Acute renal insufficiency     05/2011 admission (cr 1.54 at discharge), WILL START HD IN FALL     Past Surgical History  Procedure Laterality Date  . Coronary artery bypass graft  04/06/2011    Procedure:  CORONARY ARTERY BYPASS GRAFTING (CABG);  Surgeon: Loreli Slot, MD;  Location: [facility] OR;  Service: Open Heart Surgery;  Laterality: N/A;  Coronary Artery Bypass Graft times four utilizing the left internal mammary artery and the Right and left  greater Saphenous veins Harvested endoscopically.  . Fibroid bypass 04/06/11    . Colon surgery      rectal fissure  . Cervical disc surgery  1997 - approximate  . Colonoscopy  07/29/2011    Procedure: COLONOSCOPY;  Surgeon: Rachael Fee, MD;  Location: [facility] ENDOSCOPY;  Service: Endoscopy;  Laterality: N/A;  . Eye surgery  2011    cataract removal  . Left  heart catheterization with coronary angiogram N/A 04/06/2011    Procedure: LEFT HEART CATHETERIZATION WITH CORONARY ANGIOGRAM;  Surgeon: Iran Ouch, MD;  Location: [facility] CATH LAB;  Service: Cardiovascular;  Laterality: N/A;  . Abdominal aortagram N/A 08/02/2012    Procedure: ABDOMINAL Ronny Flurry;  Surgeon: Iran Ouch, MD;  Location: [facility] CATH LAB;  Service: Cardiovascular;  Laterality: N/A;  . Percutaneous stent intervention Left 08/02/2012    Procedure: PERCUTANEOUS STENT INTERVENTION;  Surgeon: Iran Ouch, MD;  Location: [facility] CATH LAB;  Service: Cardiovascular;  Laterality: Left;  stent x1 to lt ext iliac artery  . Lower extremity angiogram Bilateral 08/02/2012    Procedure: LOWER EXTREMITY ANGIOGRAM;  Surgeon: Iran Ouch, MD;  Location: [facility] CATH LAB;  Service: Cardiovascular;  Laterality: Bilateral;  . Ercp      X2  . Endarterectomy Right 08/29/2014    Procedure: Attempted ENDARTERECTOMY CAROTID;  Surgeon: Nada Libman, MD;  Location: [facility] OR;  Service: Vascular;  Laterality: Right;    Social History   Social History  . Marital Status: Single    Spouse Name: N/A  . Number of Children: 0  . Years of Education: N/A   Occupational History  . Not on file.   Social History Main Topics  . Smoking status: Former Smoker -- 0.50 packs/day    Types: Cigarettes    Quit date: 04/06/2011  . Smokeless tobacco: Never Used  . Alcohol Use: No  . Drug Use: No  . Sexual Activity: No   Other Topics Concern  . Not on file   Social History Narrative    Family History  Problem Relation Age of Onset  . Heart attack Mother     ?59s  . Diabetes Mother   . Heart disease Mother   . Hyperlipidemia Mother   . Hypertension Mother   . Peripheral vascular disease Mother   . Heart attack Sister     28s  . Deep vein thrombosis Sister   . Diabetes Sister     Dietitian  . Heart disease Sister   . Hyperlipidemia Sister   . Hypertension Sister   . Congestive Heart Failure Father   . Heart  disease Father   . Diabetes Brother   . Heart disease Brother   . Hyperlipidemia Brother     Allergies as of 12/25/2014 - Review Complete 12/25/2014  Allergen Reaction Noted  . Diltiazem hcl Hives and Swelling 04/06/2011  . Nifedipine Hives and Swelling 04/06/2011    Current Outpatient Prescriptions on File Prior to Visit  Medication Sig Dispense Refill  . aspirin 81 MG EC tablet Take 1 tablet (81 mg total) by mouth daily.    [name] atorvastatin (LIPITOR) 20 MG tablet TAKE 1 TABLET EVERY DAY 30 tablet 5  . calcitRIOL (ROCALTROL) 0.25 MCG capsule Take 0.25 mcg by mouth  daily.     . carvedilol (COREG) 25 MG tablet Take 1 tablet (25 mg total) by mouth 2 (two) times daily with a meal. 180 tablet 3  . clopidogrel (PLAVIX) 75 MG tablet Take 1 tablet (75 mg total) by mouth daily. 90 tablet 3  . diphenoxylate-atropine (LOMOTIL) 2.5-0.025 MG tablet TAKE 1 TABLET BY MOUTH 4 TIMES DAILY AS NEEDED FOR DIARRHEA 90 tablet 0  . epoetin alfa (EPOGEN,PROCRIT) 3000 UNIT/ML injection Inject 3,000 Units into the skin once. Take on wednesdays    . fluticasone (FLONASE) 50 MCG/ACT nasal spray Place 2 sprays into the nose daily.    . furosemide (LASIX) 40 MG tablet TAKE ONE TABLET DAILY AND MAY TAKE ONE EXTRA AS NEEDED 110 tablet 3  . glucose blood (ONETOUCH VERIO) test strip Use as instructed. DX: 250.00 100 each 3  . hydrALAZINE (APRESOLINE) 25 MG tablet Take 1 tablet (25 mg total) by mouth 3 (three) times daily. 270 tablet 3  . isosorbide dinitrate (ISORDIL) 20 MG tablet Take 1 tablet (20 mg total) by mouth 3 (three) times daily. 270 tablet 3  . nitroGLYCERIN (NITROSTAT) 0.4 MG SL tablet Place 1 tablet (0.4 mg total) under the tongue every 5 (five) minutes as needed for chest pain. 25 tablet 6  . ONETOUCH DELICA LANCETS FINE MISC Use as instructed. DX: 250.00 100 each 3  . oxyCODONE-acetaminophen (PERCOCET/ROXICET) 5-325 MG per tablet Take 1 tablet by mouth every 6 (six) hours as needed. 30 tablet 0  . paregoric  2 MG/5ML solution TAKE 1 TEASPOONFUL BY MOUTH 4 TIMES A DAY AS NEEDED FOR DIARRHEA OR LOOSE STOOLS 120 mL 0  . sodium bicarbonate 650 MG tablet Take 1,300 mg by mouth 2 (two) times daily.  12   No current facility-administered medications on file prior to visit.     REVIEW OF SYSTEMS:  please see history of present illness for pertinent positives and negatives.  The patient continues to have difficulty with his memory.  He has chronic diarrhea. He has not had any chest pain or shortness of breath.  PHYSICAL EXAMINATION:   Vital signs are  Filed Vitals:   12/25/14 1001 12/25/14 1004  BP: 171/74 139/63  Pulse: 68 68  Temp: 97.4 F (36.3 C)   TempSrc: Oral   Resp: 16   Height: 5\' 8"  (1.727 m)   Weight: 199 lb (90.266 kg)   SpO2: 96%    Body mass index is 30.26 kg/(m^2). General: The patient appears their stated age. HEENT:  No gross abnormalities Pulmonary:  Non labored breathing Musculoskeletal: There are no major deformities. Neurologic: No focal weakness or paresthesias are detected, Skin: There are no ulcer or rashes noted. Psychiatric: The patient has normal affect. Cardiovascular: There is a regular rate and rhythm without significant murmur appreciated.   Palpable radial pulse  Diagnostic Studies I have gone back and reviewed his vein mapping.  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  Assessment: #1: Chronic renal insufficiency, stageIV #2: Asymptomatic right carotid stenosis Plan: #1: The patient is scheduled to see Dr. Hyman HopesWebb next month and would like to discuss things with him before scheduling surgery for his fistula.  I did discuss the think he would be a good candidate for a left radiocephalic fistula.  We discussed the risk of non-maturity and the risk of steal syndrome.  He will contact me when he is ready to have this scheduled.  I do not need to see him again before his operation.  #  2: I feel that the patient is not a good open surgical  candidate.  I think you be best treated with a carotid stent.  However his renal insufficiency complicates this matter.  I think if we proceeded with carotid stenting he would start dialysis because the contrast load.  Therefore, I have elected to treat him nonoperatively.  I will have him follow-up in 6 months with a repeat carotid ultrasound.  If he ends up requiring dialysis, I will proceed with carotid stenting.  If his ultrasound progresses, I will have to reevaluate whether or not he would be a candidate for surgery.  Jorge Ny, M.D. [facility] Office: [phone] Pager:  [phone]

## 2015-01-01 ENCOUNTER — Encounter (HOSPITAL_COMMUNITY)
Admission: RE | Admit: 2015-01-01 | Discharge: 2015-01-01 | Disposition: A | Discharge status: Home / Self Care | Payer: Medicare Other | Source: Ambulatory Visit | Attending: Nephrology | Admitting: Nephrology

## 2015-01-01 DIAGNOSIS — D631 Anemia in chronic kidney disease: Secondary | ICD-10-CM | POA: Diagnosis not present

## 2015-01-01 LAB — FERRITIN: FERRITIN: 527 ng/mL — AB (ref 24–336)

## 2015-01-01 LAB — IRON AND TIBC
IRON: 57 ug/dL (ref 45–182)
Saturation Ratios: 22 % (ref 17.9–39.5)
TIBC: 256 ug/dL (ref 250–450)
UIBC: 199 ug/dL

## 2015-01-01 MED ORDER — EPOETIN ALFA 10000 UNIT/ML IJ SOLN: 5000.0000 [IU] | INTRAMUSCULAR | Status: DC

## 2015-01-01 MED ORDER — EPOETIN ALFA 10000 UNIT/ML IJ SOLN
INTRAMUSCULAR | Status: AC
  Administered 2015-01-01: 5000 [IU]
  Filled 2015-01-01: qty 1

## 2015-01-02 LAB — POCT HEMOGLOBIN-HEMACUE: HEMOGLOBIN: 10.7 g/dL — AB (ref 13.0–17.0)

## 2015-01-09 ENCOUNTER — Ambulatory Visit (INDEPENDENT_AMBULATORY_CARE_PROVIDER_SITE_OTHER): Payer: Medicare Other | Admitting: Cardiovascular Disease

## 2015-01-09 ENCOUNTER — Encounter: Payer: Self-pay | Admitting: Cardiovascular Disease

## 2015-01-09 VITALS — BP 130/60 | HR 74 | Ht 68.0 in | Wt 205.1 lb

## 2015-01-09 DIAGNOSIS — I2510 Atherosclerotic heart disease of native coronary artery without angina pectoris: Secondary | ICD-10-CM

## 2015-01-09 DIAGNOSIS — I10 Essential (primary) hypertension: Secondary | ICD-10-CM

## 2015-01-09 DIAGNOSIS — I5022 Chronic systolic (congestive) heart failure: Secondary | ICD-10-CM | POA: Diagnosis not present

## 2015-01-09 MED ORDER — CARVEDILOL 25 MG PO TABS: 25.0000 mg | ORAL_TABLET | Freq: Two times a day (BID) | ORAL | Status: AC

## 2015-01-09 NOTE — Patient Instructions (Addendum)
Medication Instructions:  Your physician recommends that you continue on your current medications as directed. Please refer to the Current Medication list given to you today.   Labwork: None Ordered   Testing/Procedures: None Ordered   Follow-Up: Your physician wants you to follow-up in: 6 months with Dr. Nahser.  You will receive a reminder letter in the mail two months in advance. If you don't receive a letter, please call our office to schedule the follow-up appointment.   If you need a refill on your cardiac medications before your next appointment, please call your pharmacy.   Thank you for choosing [facility]! Tiran Sauseda, RN [phone]    

## 2015-01-09 NOTE — Progress Notes (Signed)
Cardiology Office Note   Date:  01/09/2015   ID:  HASAN DOUSE, DOB 11/18/1945, MRN [id number]  PCP:  Kristian Covey, MD  Cardiologist:   [name], MD   Chief Complaint  Patient presents with  . Follow-up    CAD , CHF   Problem list: 1. Acute on chronic systolic CHF  Echo Feb. 2014 Left ventricle: The cavity size was normal. moderate LVH. The estimated ejection fraction was in the range of 35% to 40%. There is moderate hypokinesis of the mid-distalanteroseptal myocardium. pseudonormal left ventricular filling pattern, with concomitant abnormal relaxation and increased filling pressure (grade 2 diastolic dysfunction). - Aortic valve: There was very mild stenosis. Mean gradient:43mm Hg (S). Peak gradient: 13mm Hg (S). - Mitral valve: Calcified annulus. Mildly thickened leaflets .- Left atrium: The atrium was moderately dilated. - Right ventricle: The cavity size was mildly dilated. - Right atrium: The atrium was mildly dilated.  2. C. Diff colitis. 3. Acute renal insufficiency, d/c Cr 1.54  4. CAD - recent late-presenting anterior MI 04/2011 with 3V dz s/p CABG  - postop course 04/2011 complicated by LV dysfunction requiring IABP/inotropes, ABL anemia, CVA  5. 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 Diabetes mellitus  7. Tobacco abuse  8. hypertension 9. hyperlipidemia   Kurt Richard is a 69 year old gentleman with the above-noted medical history.  He is still having problems with his abdomen and the diarrhea. The last stool sample was negative for c. Diff. He needs to have another ERCP at [facility]. He has done well from a cardiac standpoint. No further exacerbations of CHf. No angina.  April 13, 2012: Kurt Richard is doing better. He saw [name] for pre-op evaluation prior gallbladder surgery. He had an echocardiogram which revealed some improvement of his left ventricular systolic function. His ejection fraction is now 35-40%. His preop labs also revealed that  he has chronic renal insufficiency with a creatinine of 2.3. His BUN is also elevated. In addition, he also has anemia. His Lasix was decreased to 40 mg 3 times a week after seeing his elevated creatinine. His breathing remains the same. He occasionally will take an extra Lasix if he develops chest congestion.  Jun 29, 2012:  He's not having episodes of chest pain or shortness of breath. Kurt Richard is making some slow progress. He is not able to exercise because of left calf pain. He is only able to stand on his legs for several minutes.  He also has neuropathy which limits his exercise. He has not been eating any extra salt.  His baseline creatinine is 2.2. He does not want to start on dialysis.   Sept. 5, 2014:  He has done well. His Hb is still low. He goes for Procrit injection every week and iron infusions on occasion. No obvious blood in his stool.  He has had peripheral arterial evaluation and stenting of his left iliac artery:  Conclusions:  1. Severe focal stenosis in the distal left external iliac artery. Moderate to significant diffuse disease in the distal right common iliac artery and right external iliac artery.  2. Occluded left SFA in a long segment with collaterals from the profunda. Three-vessel runoff below the knee.  3. Diffusely diseased right SFA throughout its course with three-vessel runoff below the knee.  4. Successful self-expanding stent placement to the left external iliac artery.  He was started back on the plavix.  He still eats an unrestricted diet - still eats carbs and fats as well as some extra salt.  Dec. 4, 2014:  Kurt Richard is doing well. BP at home is normal. No CP or dyspnea.  He's not eating much salt. He avoids hot dogs, and other salty foods. He has seen Dr. Hyman Hopes recently. He received Procrit weekly. They have discussed starting dialysis.   July 06, 2013:  Kurt Richard is doing ok he's been having some leg edema. He doubled up on his  Lasix for about a week and his leg edema has almost completely resolved. He's having problems with anemia. His hemoglobin is now finally 11.1.  Sept. 8, 2015:  [name] has been doing well from a CHF standpoint He has seen Dr. Kirke Corin who is considering a stent in right leg for PVD He is still battling anemia.   He still has diarrhea - typically after eating. Not related to lactose, He has been tested for Celiac ( negative)   April 12, 2014:  Kurt Richard is a 69 y.o. male who presents for follow up of his CHF and HTN., Has gained some weight.   Has been eating mail order desserts from [place] .  Sept. 7, 2016:  Eating lots of fast foods.  Almost every meal .   BP has been high. Has not taking  Was going to have carotid surgery 4 weeks ago   , the incision was made, had a brief cardiac arrest - likely related to anesthesia   Had CPR for 5 minutes  Has made a nice recovery . Had some confusion for about a week .    January 09, 2015:  Kurt Richard seen back for follow-up visit today. He's not having any episodes of chest pain or shortness breath. He has chronic diarrhea which she thinks might be due to the carvedilol. Has chronic anemia and is on Procrit.   Past Medical History  Diagnosis Date  . Diabetes mellitus   . Hypertension   . Chronic systolic heart failure (HCC)     echo 04/12/11: Mild LVH, EF 30-35%, mid to distal anteroseptal and apical hypokinesis, grade 2 diastolic dysfunction, moderate LAE, mild RAE.  [name] Hyperlipidemia   . Tobacco abuse   . CAD (coronary artery disease)     acute anterior STEMI, late presentation 04/06/11 LHC 3/5 demonstrated severe ostial left main 95% stenosis and otherwise three-vessel CAD with an ejection fraction of 15%.  Emergent CABG: Dr. Dorris Fetch - Grafts: LIMA-LAD, SVG-D1, SVG-OM1, SVG-PDA and PL.  . Ischemic cardiomyopathy   . DM2 (diabetes mellitus, type 2) (HCC)   . 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 Anemia     Post-CABG    . Arthritis   . Stroke (HCC)   . Neuromuscular disorder (HCC)     diabetic neuropathy  . Diarrhea     has had c diff diarrhea  . Weakness   . Bruises easily   . PAD (peripheral artery disease) (HCC) 07/28/2012    Angiography 08/2012: Right: 90% focal stenosis in external iliac artery, long occlusion of SFA with 3 vessel run off . s/p slef expanding stent placement to external iliac artery. Left: borderline significant disease in comon and external iliac arteries. Diffuse 70-90% in SFA with 3 vessel run off.   . Bilateral carotid artery disease (HCC)   . Difficult intubation     PATIENT STATED HERNIATED 2 DISCS  HAD TO HAVE CERVICAL SURGERY  . CHF (congestive heart failure) (HCC)   . Acute renal insufficiency     05/2011 admission (cr 1.54 at discharge), WILL START  HD IN FALL     Past Surgical History  Procedure Laterality Date  . Coronary artery bypass graft  04/06/2011    Procedure: CORONARY ARTERY BYPASS GRAFTING (CABG);  Surgeon: Loreli SlotSteven C Hendrickson, MD;  [facility];  Service: Open Heart Surgery;  Laterality: N/A;  Coronary Artery Bypass Graft times four utilizing the left internal mammary artery and the Right and left  greater Saphenous veins Harvested endoscopically.  . Fibroid bypass 04/06/11    . Colon surgery      rectal fissure  . Cervical disc surgery  1997 - approximate  . Colonoscopy  07/29/2011    Procedure: COLONOSCOPY;  Surgeon: Rachael Feeaniel P Jacobs, MD;  Location: [facility] ENDOSCOPY;  Service: Endoscopy;  Laterality: N/A;  . Eye surgery  2011    cataract removal  . Left heart catheterization with coronary angiogram N/A 04/06/2011    Procedure: LEFT HEART CATHETERIZATION WITH CORONARY ANGIOGRAM;  Surgeon: Iran OuchMuhammad A Arida, MD;  Location: [facility] CATH LAB;  Service: Cardiovascular;  Laterality: N/A;  . Abdominal aortagram N/A 08/02/2012    Procedure: ABDOMINAL Ronny FlurryAORTAGRAM;  Surgeon: Iran OuchMuhammad A Arida, MD;  Location: [facility] CATH LAB;  Service: Cardiovascular;  Laterality: N/A;  . Percutaneous  stent intervention Left 08/02/2012    Procedure: PERCUTANEOUS STENT INTERVENTION;  Surgeon: Iran OuchMuhammad A Arida, MD;  Location: [facility] CATH LAB;  Service: Cardiovascular;  Laterality: Left;  stent x1 to lt ext iliac artery  . Lower extremity angiogram Bilateral 08/02/2012    Procedure: LOWER EXTREMITY ANGIOGRAM;  Surgeon: Iran OuchMuhammad A Arida, MD;  Location: [facility] CATH LAB;  Service: Cardiovascular;  Laterality: Bilateral;  . Ercp      X2  . Endarterectomy Right 08/29/2014    Procedure: Attempted ENDARTERECTOMY CAROTID;  Surgeon: Nada LibmanVance W Brabham, MD;  [facility];  Service: Vascular;  Laterality: Right;     Current Outpatient Prescriptions  Medication Sig Dispense Refill  . aspirin 81 MG EC tablet Take 1 tablet (81 mg total) by mouth daily.    [name]. atorvastatin (LIPITOR) 20 MG tablet TAKE 1 TABLET EVERY DAY 30 tablet 5  . calcitRIOL (ROCALTROL) 0.25 MCG capsule Take 0.25 mcg by mouth daily.     . carvedilol (COREG) 25 MG tablet Take 1 tablet (25 mg total) by mouth 2 (two) times daily with a meal. 180 tablet 3  . clopidogrel (PLAVIX) 75 MG tablet Take 1 tablet (75 mg total) by mouth daily. 90 tablet 3  . diphenoxylate-atropine (LOMOTIL) 2.5-0.025 MG tablet TAKE 1 TABLET BY MOUTH 4 TIMES DAILY AS NEEDED FOR DIARRHEA 90 tablet 0  . epoetin alfa (EPOGEN,PROCRIT) 3000 UNIT/ML injection Inject 3,000 Units into the skin every 30 (thirty) days. Take on wednesdays    . fluticasone (FLONASE) 50 MCG/ACT nasal spray Place 2 sprays into the nose daily.    . furosemide (LASIX) 40 MG tablet TAKE ONE TABLET DAILY AND MAY TAKE ONE EXTRA AS NEEDED (Patient taking differently: TAKE ONE TABLET DAILY AND MAY TAKE ONE EXTRA AS NEEDED FOR FLUID) 110 tablet 3  . glucose blood (ONETOUCH VERIO) test strip Use as instructed. DX: 250.00 100 each 3  . hydrALAZINE (APRESOLINE) 25 MG tablet Take 1 tablet (25 mg total) by mouth 3 (three) times daily. 270 tablet 3  . isosorbide dinitrate (ISORDIL) 20 MG tablet Take 1 tablet (20 mg total) by  mouth 3 (three) times daily. 270 tablet 3  . nitroGLYCERIN (NITROSTAT) 0.4 MG SL tablet Place 1 tablet (0.4 mg total) under the tongue every 5 (five) minutes as needed  for chest pain. 25 tablet 6  . ONETOUCH DELICA LANCETS FINE MISC Use as instructed. DX: 250.00 100 each 3  . oxyCODONE-acetaminophen (PERCOCET/ROXICET) 5-325 MG per tablet Take 1 tablet by mouth every 6 (six) hours as needed. (Patient taking differently: Take 1 tablet by mouth every 6 (six) hours as needed for moderate pain or severe pain. ) 30 tablet 0  . paregoric 2 MG/5ML solution TAKE 1 TEASPOONFUL BY MOUTH 4 TIMES A DAY AS NEEDED FOR DIARRHEA OR LOOSE STOOLS 120 mL 0  . sodium bicarbonate 650 MG tablet Take 1,300 mg by mouth 2 (two) times daily.  12   No current facility-administered medications for this visit.    Allergies:   Diltiazem hcl and Nifedipine    Social History:  The patient  reports that he quit smoking about 3 years ago. His smoking use included Cigarettes. He smoked 0.50 packs per day. He has never used smokeless tobacco. He reports that he does not drink alcohol or use illicit drugs.   Family History:  The patient's family history includes Congestive Heart Failure in his father; Deep vein thrombosis in his sister; Diabetes in his brother, mother, and sister; Heart attack in his mother and sister; Heart disease in his brother, father, mother, and sister; Hyperlipidemia in his brother, mother, and sister; Hypertension in his mother and sister; Peripheral vascular disease in his mother.    ROS:  Please see the history of present illness.    Review of Systems: Constitutional:  denies fever, chills, diaphoresis, appetite change and fatigue.  HEENT: denies photophobia, eye pain, redness, hearing loss, ear pain, congestion, sore throat, rhinorrhea, sneezing, neck pain, neck stiffness and tinnitus.  Respiratory: denies SOB, DOE, cough, chest tightness, and wheezing.  Cardiovascular: denies chest pain, palpitations  and leg swelling.  Gastrointestinal: denies nausea, vomiting, abdominal pain, diarrhea, constipation, blood in stool.  Genitourinary: denies dysuria, urgency, frequency, hematuria, flank pain and difficulty urinating.  Musculoskeletal: denies  myalgias, back pain, joint swelling, arthralgias and gait problem.   Skin: denies pallor, rash and wound.  Neurological: denies dizziness, seizures, syncope, weakness, light-headedness, numbness and headaches.   Hematological: denies adenopathy, easy bruising, personal or family bleeding history.  Psychiatric/ Behavioral: denies suicidal ideation, mood changes, confusion, nervousness, sleep disturbance and agitation.       All other systems are reviewed and negative.    PHYSICAL EXAM: VS:  BP 130/60 mmHg  Pulse 74  Ht 5\' 8"  (1.727 m)  Wt 205 lb 1.9 oz (93.042 kg)  BMI 31.20 kg/m2 , BMI Body mass index is 31.2 kg/(m^2). GEN: Well nourished, well developed, in no acute distress HEENT: normal Neck: no JVD, carotid bruits, or masses Cardiac: RRR; no murmurs, rubs, or gallops,no edema  Respiratory:  clear to auscultation bilaterally, normal work of breathing GI: soft, nontender, nondistended, + BS MS: no deformity or atrophy Skin: warm and dry, no rash Neuro:  Strength and sensation are intact Psych: normal   EKG:  EKG is not ordered today.    Recent Labs: 06/18/2014: TSH 0.45 08/31/2014: Platelets 175 10/03/2014: ALT 7; BUN 68*; Creatinine, Ser 3.19*; Potassium 5.6*; Sodium 144 01/01/2015: Hemoglobin 10.7*    Lipid Panel    Component Value Date/Time   CHOL 105 10/03/2014 1010   TRIG 82.0 10/03/2014 1010   HDL 27.10* 10/03/2014 1010   CHOLHDL 4 10/03/2014 1010   VLDL 16.4 10/03/2014 1010   LDLCALC 61 10/03/2014 1010   LDLDIRECT 155.3 10/10/2012 1032      Wt Readings from  Last 3 Encounters:  01/09/15 205 lb 1.9 oz (93.042 kg)  12/25/14 199 lb (90.266 kg)  11/06/14 200 lb (90.719 kg)      Other studies Reviewed: Additional  studies/ records that were reviewed today include: . Review of the above records demonstrates:    ASSESSMENT AND PLAN:  1. Acute on chronic systolic CHF  Echo Feb. 2014 Left ventricle: The cavity size was normal. moderate LVH. The estimated ejection fraction was in the range of 35% to 40%. There is moderate hypokinesis of the mid-distalanteroseptal myocardium. pseudonormal left ventricular filling pattern, with concomitant abnormal relaxation and increased filling pressure (grade 2 diastolic dysfunction). - Aortic valve: There was very mild stenosis. Mean gradient:48mm Hg (S). Peak gradient: 13mm Hg (S). - Mitral valve: Calcified annulus. Mildly thickened leaflets .- Left atrium: The atrium was moderately dilated. - Right ventricle: The cavity size was mildly dilated. - Right atrium: The atrium was mildly dilated.  2. C. Diff colitis. 3. Acute renal insufficiency, d/c Cr 1.54  4. CAD - recent late-presenting anterior MI 04/2011 with 3V dz s/p CABG  - postop course 04/2011 complicated by LV dysfunction requiring IABP/inotropes, ABL anemia, CVA  5. 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 Diabetes mellitus  7. Tobacco abuse -  He has stopped smoking   8. Hypertension - BP has been elevated.  Has been been eating more salt .  Encouraged him to make better decisions.  9. Hyperlipidemia -  We'll check fasting labs today. 10 Weight gain :  He has been eating lots of [employer] and [employer].   Encouraged lower carb diet and regular exercise.    Current medicines are reviewed at length with the patient today.  The patient does not have concerns regarding medicines.  The following changes have been made:  no change   Disposition:   FU with me in 6 months     Signed, Nahser, Deloris Ping, MD  01/09/2015 11:20 AM    [facility] [address] [place], [place], [place]  [address] Phone: [phone]; Fax: [phone]

## 2015-01-24 ENCOUNTER — Inpatient Hospital Stay (HOSPITAL_COMMUNITY): Admission: RE | Admit: 2015-01-24 | Payer: Medicare Other | Source: Ambulatory Visit

## 2015-01-28 ENCOUNTER — Other Ambulatory Visit: Payer: Self-pay | Admitting: Family Medicine

## 2015-01-28 ENCOUNTER — Other Ambulatory Visit: Payer: Self-pay | Admitting: Cardiovascular Disease

## 2015-01-28 DIAGNOSIS — I6523 Occlusion and stenosis of bilateral carotid arteries: Secondary | ICD-10-CM

## 2015-01-28 DIAGNOSIS — I739 Peripheral vascular disease, unspecified: Secondary | ICD-10-CM

## 2015-01-28 NOTE — Telephone Encounter (Signed)
Refill OK

## 2015-01-28 NOTE — Telephone Encounter (Signed)
Last refill was 12-19-2014 #90, 0rf Seen on 09-03-2014 No pending appt.  Okay for refills?

## 2015-01-29 ENCOUNTER — Encounter (HOSPITAL_COMMUNITY)
Admission: RE | Admit: 2015-01-29 | Discharge: 2015-01-29 | Disposition: A | Discharge status: Home / Self Care | Payer: Medicare Other | Source: Ambulatory Visit | Attending: Nephrology | Admitting: Nephrology

## 2015-01-29 DIAGNOSIS — D631 Anemia in chronic kidney disease: Secondary | ICD-10-CM | POA: Insufficient documentation

## 2015-01-29 DIAGNOSIS — N183 Chronic kidney disease, stage 3 (moderate): Secondary | ICD-10-CM | POA: Diagnosis not present

## 2015-01-29 LAB — FERRITIN: FERRITIN: 453 ng/mL — AB (ref 24–336)

## 2015-01-29 LAB — IRON AND TIBC
Iron: 59 ug/dL (ref 45–182)
Saturation Ratios: 23 % (ref 17.9–39.5)
TIBC: 252 ug/dL (ref 250–450)
UIBC: 193 ug/dL

## 2015-01-29 LAB — POCT HEMOGLOBIN-HEMACUE: HEMOGLOBIN: 10.4 g/dL — AB (ref 13.0–17.0)

## 2015-01-29 MED ORDER — EPOETIN ALFA 10000 UNIT/ML IJ SOLN
5000.0000 [IU] | INTRAMUSCULAR | Status: DC
  Administered 2015-01-29: 5000 [IU] via SUBCUTANEOUS

## 2015-01-29 MED ORDER — EPOETIN ALFA 10000 UNIT/ML IJ SOLN
INTRAMUSCULAR | Status: AC
  Filled 2015-01-29: qty 1

## 2015-01-31 LAB — HM DIABETES EYE EXAM

## 2015-02-10 ENCOUNTER — Inpatient Hospital Stay (HOSPITAL_COMMUNITY): Admission: RE | Admit: 2015-02-10 | Payer: Medicare Other | Source: Ambulatory Visit

## 2015-02-11 ENCOUNTER — Encounter: Payer: Self-pay | Admitting: Family Medicine

## 2015-02-11 ENCOUNTER — Ambulatory Visit: Payer: Medicare Other | Admitting: Cardiovascular Disease

## 2015-02-20 ENCOUNTER — Other Ambulatory Visit: Payer: Self-pay | Admitting: Family Medicine

## 2015-02-21 ENCOUNTER — Telehealth: Payer: Self-pay

## 2015-02-21 NOTE — Telephone Encounter (Signed)
Last seen on 09-03-2014 Last refill #90 01/29/2015  No pending appt Okay for refill

## 2015-02-21 NOTE — Telephone Encounter (Signed)
Phone call to pt.  Spoke with pt's Significant Other.  Inquired about pt's renal insufficiency, and Nephrology recommendation for obtaining dialysis access.  Pt's. S.O. stated the pt. had blood work 3 days ago, which had been ordered by his Nephrologist, and they're awaiting results.  Stated he would like the nurse to contact him at end of next week, and he will have better understanding of recommendation by the Nephrologist.

## 2015-02-22 NOTE — Telephone Encounter (Signed)
Usually has been getting every month.  Is he requiring > than 3/day?  Refill but would try to use no more than one q 8 hours.

## 2015-02-24 ENCOUNTER — Ambulatory Visit (HOSPITAL_COMMUNITY)
Admission: RE | Admit: 2015-02-24 | Discharge: 2015-02-24 | Disposition: A | Discharge status: Home / Self Care | Payer: Medicare Other | Source: Ambulatory Visit | Attending: Cardiovascular Disease | Admitting: Cardiovascular Disease

## 2015-02-24 DIAGNOSIS — Z951 Presence of aortocoronary bypass graft: Secondary | ICD-10-CM | POA: Diagnosis not present

## 2015-02-24 DIAGNOSIS — E785 Hyperlipidemia, unspecified: Secondary | ICD-10-CM | POA: Insufficient documentation

## 2015-02-24 DIAGNOSIS — E119 Type 2 diabetes mellitus without complications: Secondary | ICD-10-CM | POA: Insufficient documentation

## 2015-02-24 DIAGNOSIS — I739 Peripheral vascular disease, unspecified: Secondary | ICD-10-CM

## 2015-02-24 DIAGNOSIS — I70203 Unspecified atherosclerosis of native arteries of extremities, bilateral legs: Secondary | ICD-10-CM | POA: Insufficient documentation

## 2015-02-24 DIAGNOSIS — I2510 Atherosclerotic heart disease of native coronary artery without angina pectoris: Secondary | ICD-10-CM | POA: Diagnosis not present

## 2015-02-24 DIAGNOSIS — Z95820 Peripheral vascular angioplasty status with implants and grafts: Secondary | ICD-10-CM | POA: Diagnosis not present

## 2015-02-24 DIAGNOSIS — I10 Essential (primary) hypertension: Secondary | ICD-10-CM | POA: Insufficient documentation

## 2015-02-24 DIAGNOSIS — Z87891 Personal history of nicotine dependence: Secondary | ICD-10-CM | POA: Diagnosis not present

## 2015-02-24 DIAGNOSIS — I6523 Occlusion and stenosis of bilateral carotid arteries: Secondary | ICD-10-CM | POA: Insufficient documentation

## 2015-02-26 ENCOUNTER — Encounter (HOSPITAL_COMMUNITY)
Admission: RE | Admit: 2015-02-26 | Discharge: 2015-02-26 | Disposition: A | Discharge status: Home / Self Care | Payer: Medicare Other | Source: Ambulatory Visit | Attending: Nephrology | Admitting: Nephrology

## 2015-02-26 DIAGNOSIS — D631 Anemia in chronic kidney disease: Secondary | ICD-10-CM | POA: Insufficient documentation

## 2015-02-26 DIAGNOSIS — N183 Chronic kidney disease, stage 3 (moderate): Secondary | ICD-10-CM | POA: Insufficient documentation

## 2015-02-26 LAB — POCT HEMOGLOBIN-HEMACUE: Hemoglobin: 10.7 g/dL — ABNORMAL LOW (ref 13.0–17.0)

## 2015-02-26 LAB — FERRITIN: FERRITIN: 470 ng/mL — AB (ref 24–336)

## 2015-02-26 LAB — IRON AND TIBC
IRON: 50 ug/dL (ref 45–182)
Saturation Ratios: 20 % (ref 17.9–39.5)
TIBC: 246 ug/dL — ABNORMAL LOW (ref 250–450)
UIBC: 196 ug/dL

## 2015-02-26 MED ORDER — EPOETIN ALFA 10000 UNIT/ML IJ SOLN
INTRAMUSCULAR | Status: AC
  Filled 2015-02-26: qty 1

## 2015-02-26 MED ORDER — EPOETIN ALFA 10000 UNIT/ML IJ SOLN
5000.0000 [IU] | INTRAMUSCULAR | Status: DC
  Administered 2015-02-26: 5000 [IU] via SUBCUTANEOUS

## 2015-03-04 ENCOUNTER — Encounter: Payer: Self-pay | Admitting: Cardiovascular Disease

## 2015-03-04 ENCOUNTER — Ambulatory Visit (INDEPENDENT_AMBULATORY_CARE_PROVIDER_SITE_OTHER): Payer: Medicare Other | Admitting: Cardiovascular Disease

## 2015-03-04 VITALS — BP 144/66 | HR 80 | Ht 68.0 in | Wt 202.0 lb

## 2015-03-04 DIAGNOSIS — I10 Essential (primary) hypertension: Secondary | ICD-10-CM

## 2015-03-04 DIAGNOSIS — E785 Hyperlipidemia, unspecified: Secondary | ICD-10-CM

## 2015-03-04 DIAGNOSIS — I739 Peripheral vascular disease, unspecified: Secondary | ICD-10-CM | POA: Diagnosis not present

## 2015-03-04 DIAGNOSIS — I6523 Occlusion and stenosis of bilateral carotid arteries: Secondary | ICD-10-CM

## 2015-03-04 NOTE — Assessment & Plan Note (Signed)
Lab Results  Component Value Date   CHOL 105 10/03/2014   HDL 27.10* 10/03/2014   LDLCALC 61 10/03/2014        TRIG 82.0 10/03/2014   CHOLHDL 4 10/03/2014   Continue treatment with atorvastatin. LDL was at target of less than 70.

## 2015-03-04 NOTE — Progress Notes (Signed)
Primary cardiologist: Dr. Elease Hashimoto  HPI  This is a pleasant 70 year old man who is here today for a followup visit regarding peripheral arterial disease. The patient has known history of coronary artery disease status post anterior MI in 2013. He is status post CABG. He had severe LV systolic dysfunction at that time with gradual improvement. Most recent ejection fraction in 2014 was 35-40%. He has multiple chronic medical conditions including type 2 diabetes, severe bilateral carotid stenosis, hypertension, hyperlipidemia and advanced chronic kidney disease. Most recent creatinine was 3.45. He suffers from chronic diarrhea.  He is status post left external iliac artery stent placement in June 2014. He is known to have occluded left SFA  and diffuse significant disease affecting the right SFA.  The patient was referred for carotid endarterectomy in July. He had brief cardiac arrest with induction of anesthesia and the surgery was aborted. Repeated carotid Doppler recently showed significant progression bilaterally. The patient is not a candidate for carotid stenting at the present time due to advanced chronic kidney disease.  Recent noninvasive evaluation showed a drop an ABI on the right side with significant external iliac stenosis. However, the patient denies claudication. He reports no chest pain and no worsening dyspnea.    Allergies  Allergen Reactions  . Diltiazem Hcl Hives and Swelling  . Nifedipine Hives and Swelling     Current Outpatient Prescriptions on File Prior to Visit  Medication Sig Dispense Refill  . aspirin 81 MG EC tablet Take 1 tablet (81 mg total) by mouth daily.    [name] atorvastatin (LIPITOR) 20 MG tablet TAKE 1 TABLET EVERY DAY 30 tablet 5  . calcitRIOL (ROCALTROL) 0.25 MCG capsule Take 0.25 mcg by mouth daily.     . carvedilol (COREG) 25 MG tablet Take 1 tablet (25 mg total) by mouth 2 (two) times daily with a meal. 180 tablet 3  . clopidogrel (PLAVIX) 75 MG tablet  Take 1 tablet (75 mg total) by mouth daily. 90 tablet 3  . diphenoxylate-atropine (LOMOTIL) 2.5-0.025 MG tablet Take 1 tablet by mouth 3 (three) times daily. Try to limit to only 3 tablets daily. 90 tablet 0  . epoetin alfa (EPOGEN,PROCRIT) 3000 UNIT/ML injection Inject 3,000 Units into the skin every 30 (thirty) days. Take on wednesdays    . fluticasone (FLONASE) 50 MCG/ACT nasal spray Place 2 sprays into the nose daily.    . furosemide (LASIX) 40 MG tablet TAKE ONE TABLET DAILY AND MAY TAKE ONE EXTRA AS NEEDED (Patient taking differently: TAKE ONE TABLET DAILY AND MAY TAKE ONE EXTRA AS NEEDED FOR FLUID) 110 tablet 3  . glucose blood (ONETOUCH VERIO) test strip Use as instructed. DX: 250.00 100 each 3  . hydrALAZINE (APRESOLINE) 25 MG tablet Take 1 tablet (25 mg total) by mouth 3 (three) times daily. 270 tablet 3  . isosorbide dinitrate (ISORDIL) 20 MG tablet Take 1 tablet (20 mg total) by mouth 3 (three) times daily. 270 tablet 3  . nitroGLYCERIN (NITROSTAT) 0.4 MG SL tablet Place 1 tablet (0.4 mg total) under the tongue every 5 (five) minutes as needed for chest pain. 25 tablet 6  . ONETOUCH DELICA LANCETS FINE MISC Use as instructed. DX: 250.00 100 each 3  . oxyCODONE-acetaminophen (PERCOCET/ROXICET) 5-325 MG per tablet Take 1 tablet by mouth every 6 (six) hours as needed. (Patient taking differently: Take 1 tablet by mouth every 6 (six) hours as needed for moderate pain or severe pain. ) 30 tablet 0  . paregoric 2 MG/5ML  solution TAKE 1 TEASPOONFUL BY MOUTH 4 TIMES A DAY AS NEEDED FOR DIARRHEA OR LOOSE STOOLS 120 mL 0  . sodium bicarbonate 650 MG tablet Take 1,300 mg by mouth 2 (two) times daily.  12   No current facility-administered medications on file prior to visit.     Past Medical History  Diagnosis Date  . Diabetes mellitus   . Hypertension   . Chronic systolic heart failure (HCC)     echo 04/12/11: Mild LVH, EF 30-35%, mid to distal anteroseptal and apical hypokinesis, grade 2  diastolic dysfunction, moderate LAE, mild RAE.  [name] Hyperlipidemia   . Tobacco abuse   . CAD (coronary artery disease)     acute anterior STEMI, late presentation 04/06/11 LHC 3/5 demonstrated severe ostial left main 95% stenosis and otherwise three-vessel CAD with an ejection fraction of 15%.  Emergent CABG: Dr. Dorris Fetch - Grafts: LIMA-LAD, SVG-D1, SVG-OM1, SVG-PDA and PL.  . Ischemic cardiomyopathy   . DM2 (diabetes mellitus, type 2) (HCC)   . 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 Anemia     Post-CABG  . Arthritis   . Stroke (HCC)   . Neuromuscular disorder (HCC)     diabetic neuropathy  . Diarrhea     has had c diff diarrhea  . Weakness   . Bruises easily   . PAD (peripheral artery disease) (HCC) 07/28/2012    Angiography 08/2012: Right: 90% focal stenosis in external iliac artery, long occlusion of SFA with 3 vessel run off . s/p slef expanding stent placement to external iliac artery. Left: borderline significant disease in comon and external iliac arteries. Diffuse 70-90% in SFA with 3 vessel run off.   . Bilateral carotid artery disease (HCC)   . Difficult intubation     PATIENT STATED HERNIATED 2 DISCS  HAD TO HAVE CERVICAL SURGERY  . CHF (congestive heart failure) (HCC)   . Acute renal insufficiency     05/2011 admission (cr 1.54 at discharge), WILL START HD IN [place]      Past Surgical History  Procedure Laterality Date  . Coronary artery bypass graft  04/06/2011    Procedure: CORONARY ARTERY BYPASS GRAFTING (CABG);  Surgeon: Loreli Slot, MD;  Location: [facility] OR;  Service: Open Heart Surgery;  Laterality: N/A;  Coronary Artery Bypass Graft times four utilizing the left internal mammary artery and the Right and left  greater Saphenous veins Harvested endoscopically.  . Fibroid bypass 04/06/11    . Colon surgery      rectal fissure  . Cervical disc surgery  1997 - approximate  . Colonoscopy  07/29/2011    Procedure: COLONOSCOPY;  Surgeon: Rachael Fee, MD;   Location: [facility] ENDOSCOPY;  Service: Endoscopy;  Laterality: N/A;  . Eye surgery  2011    cataract removal  . Left heart catheterization with coronary angiogram N/A 04/06/2011    Procedure: LEFT HEART CATHETERIZATION WITH CORONARY ANGIOGRAM;  Surgeon: Iran Ouch, MD;  Location: [facility] CATH LAB;  Service: Cardiovascular;  Laterality: N/A;  . Abdominal aortagram N/A 08/02/2012    Procedure: ABDOMINAL Ronny Flurry;  Surgeon: Iran Ouch, MD;  Location: [facility] CATH LAB;  Service: Cardiovascular;  Laterality: N/A;  . Percutaneous stent intervention Left 08/02/2012    Procedure: PERCUTANEOUS STENT INTERVENTION;  Surgeon: Iran Ouch, MD;  Location: [facility] CATH LAB;  Service: Cardiovascular;  Laterality: Left;  stent x1 to lt ext iliac artery  . Lower extremity angiogram Bilateral 08/02/2012    Procedure: LOWER  EXTREMITY ANGIOGRAM;  Surgeon: Iran Ouch, MD;  Location: [facility] CATH LAB;  Service: Cardiovascular;  Laterality: Bilateral;  . Ercp      X2  . Endarterectomy Right 08/29/2014    Procedure: Attempted ENDARTERECTOMY CAROTID;  Surgeon: Nada Libman, MD;  Location: [facility] OR;  Service: Vascular;  Laterality: Right;     Family History  Problem Relation Age of Onset  . Heart attack Mother     ?67s  . Diabetes Mother   . Heart disease Mother   . Hyperlipidemia Mother   . Hypertension Mother   . Peripheral vascular disease Mother   . Heart attack Sister     51s  . Deep vein thrombosis Sister   . Diabetes Sister     Dietitian  . Heart disease Sister   . Hyperlipidemia Sister   . Hypertension Sister   . Congestive Heart Failure Father   . Heart disease Father   . Diabetes Brother   . Heart disease Brother   . Hyperlipidemia Brother      Social History   Social History  . Marital Status: Single    Spouse Name: N/A  . Number of Children: 0  . Years of Education: N/A   Occupational History  . Not on file.   Social History Main Topics  . Smoking status: Former Smoker -- 0.50 packs/day      Types: Cigarettes    Quit date: 04/06/2011  . Smokeless tobacco: Never Used  . Alcohol Use: No  . Drug Use: No  . Sexual Activity: No   Other Topics Concern  . Not on file   Social History Narrative       PHYSICAL EXAM   BP 144/66 mmHg  Pulse 80  Ht  (1.727 m)  Wt 202 lb (91.627 kg)  BMI 30.72 kg/m2 Constitutional: He is oriented to person, place, and time. He appears well-developed and well-nourished. No distress.  HENT: No nasal discharge.  Head: Normocephalic and atraumatic.  Eyes: Pupils are equal and round. Right eye exhibits no discharge. Left eye exhibits no discharge.  Neck: Normal range of motion. Neck supple. No JVD present. No thyromegaly present.  Cardiovascular: Normal rate, regular rhythm, normal heart sounds and. Exam reveals no gallop and no friction rub. No murmur heard.  Pulmonary/Chest: Effort normal and breath sounds normal. No stridor. No respiratory distress. He has no wheezes. He has no rales. He exhibits no tenderness.  Abdominal: Soft. Bowel sounds are normal. He exhibits no distension. There is no tenderness. There is no rebound and no guarding.  Musculoskeletal: Normal range of motion. He exhibits mild edema worse on the left side and no tenderness.  Neurological: He is alert and oriented to person, place, and time. Coordination normal.  Skin: Skin is warm and dry. Redness in the left leg. He is not diaphoretic. No erythema. No pallor.  Psychiatric: He has a normal mood and affect. His behavior is normal. Judgment and thought content normal.  Vascular:  Femoral pulse: Diminished on both sides.  Distal pulses are not palpable.     ASSESSMENT AND PLAN

## 2015-03-04 NOTE — Assessment & Plan Note (Signed)
Recent noninvasive evaluation showed worsening of right external iliac artery stenosis with a drop an ABI. However, the patient reports no claudication. Continue medical therapy.

## 2015-03-04 NOTE — Assessment & Plan Note (Signed)
The patient has severe bilateral carotid stenosis. I discussed the case last week with Dr. Myra Gianotti who does not recommend surgery given that the patient had cardiac arrest with anesthesia induction. The patient is presently not a candidate for carotid stenting due  advanced chronic kidney disease currently not on dialysis. the patient is in the process of getting a fistula. Once he is on dialysis, he can be considered for carotid stenting.

## 2015-03-04 NOTE — Assessment & Plan Note (Signed)
Blood pressure is controlled on current medications. 

## 2015-03-04 NOTE — Patient Instructions (Signed)

## 2015-03-07 ENCOUNTER — Other Ambulatory Visit: Payer: Self-pay

## 2015-03-19 ENCOUNTER — Encounter (HOSPITAL_COMMUNITY): Payer: Self-pay | Admitting: Vascular Surgery

## 2015-03-19 MED ORDER — DEXTROSE 5 % IV SOLN
1.5000 g | INTRAVENOUS | Status: AC
  Administered 2015-03-20: 1.5 g via INTRAVENOUS
  Filled 2015-03-19: qty 1.5

## 2015-03-19 MED ORDER — CHLORHEXIDINE GLUCONATE CLOTH 2 % EX PADS: 6.0000 | MEDICATED_PAD | Freq: Once | CUTANEOUS | Status: DC

## 2015-03-19 MED ORDER — SODIUM CHLORIDE 0.9 % IV SOLN
INTRAVENOUS | Status: DC
  Administered 2015-03-20: 07:00:00 via INTRAVENOUS

## 2015-03-19 NOTE — Progress Notes (Signed)
Spoke with pt's spouse, Henrik Orihuela about time change. He states they are aware and know that they need to be here at 5:30 AM.

## 2015-03-19 NOTE — Progress Notes (Addendum)
Anesthesia Chart Review: SAME DAY WORK-UP.  Patient is a 70 year old male scheduled for LUE AVF creation on 03/20/15 by Dr. Myra Gianotti (office notes says he thinks patient will be a good candidate for radiocephalic AVF). Anesthesia type is posted for MAC. Of note, patient underwent attempted right carotid endarterectomy on 08/29/14, but the case was aborted due to PEA arrest after incision was make (carotid artery was not exposed but facial vein had been ligated). Event was thought possibly baroreceptor mediated +/- anesthesia related. He required approximately 5 minutes of CPR. Troponin peaked at 0.22. Echo showed. Dr. Myra Gianotti has discussed options with cardiologist Dr. Kirke Corin. At this time, patient is not being considered for carotid endarterectomy given his history of perioperative cardiac arrest. Carotid artery stenting will be considered due to has advanced CKD. Plans are for patient to undergo AVF creation and consider carotid stenting once he has started hemodialysis.   History includes former smoker, CKD stage IV, CAD/STEMI (late presentation) s/p CABG 04/06/11 (LIMA-LAD, SVG-D1, SVG-OM1, SVG-PDA-PLA), chronic systolic CHF, ischemic CM, HTN, HLD, DM2 with neuropathy, cardioembolic CVA post-CABG, bruises easily, anemia, PAD s/p left EIA stent '14, severe bilateral ICA stenosis (to be considered for carotid stenting once he has started dialysis), chronic diarrhea. BMI is consistent with mild obesity. PCP is listed as Dr. Evelena Peat. Nephrologist is Dr. Elvis Coil. Cardiologists have been Dr. Kirke Corin, last visit 03/04/15, and Dr. Elease Hashimoto (primary), last visit 01/09/15. Neither note addressed when follow-up echo is planned to re-evaluate EF. Dr. Verna Czech 08/31/14 hospital note does discuss echo findings stating, "LVEF 20-25%, diffuse hypokinesis, grade II diastolic dysfunction. Decreased from echo 03/2012 where LVEF was 35-40%. No new focal WMAs, the diffuse hypokinesis may be related to a stress induced CM. Restart  his home beta blocker at lower dose, no ACE due to poor renal function. "  For anesthesia history: - DIFFICULT INTUBATION at the time of previous c-spine surgery, but no reported issues at the time of his 2013 CABG. Anesthesia records previously reviewed from 04/06/11 and indicate he had IV induction, grade II view, MAC and 3, stylet used to insert 8.0 mm ETT under DL.According to 08/29/14, anesthesia records difficult airway was anticipated due to reduced neck mobility, anterior larynx, and large tongue. He was successfully intubated on the first attempt using IV induction, rigid stylet and video laryngoscopy (glidescope).  - PEA arrest 08/29/14 as discussed above.    Meds include AS A 81 mg, Plavix, Lipitor, calcitriol, Coreg, Epogen, Flonase, Lasix, hydralazine, Isordil, Nitro, Percocet. Dr. Myra Gianotti advised patient to continue Plavix perioperatively.   08/30/14 EKG: SR with first degree AVB, LAD, septal infarct (age undetermined), T wave abnormality, consider lateral ischemia. No significant change from last tracing.  08/30/14 Echo: Study Conclusions - Left ventricle: The cavity size was moderately dilated. Wall thickness was normal. Systolic function was severely reduced. The estimated ejection fraction was in the range of 20% to 25%. Diffuse hypokinesis. Features are consistent with a pseudonormal left ventricular filling pattern, with concomitant abnormal relaxation and increased filling pressure (grade 2 diastolic dysfunction). Doppler parameters are consistent with high ventricular filling pressure. - Aortic valve: There was mild stenosis. Valve area (VTI): 1.45 cm^2. Valve area (Vmax): 1.51 cm^2. Valve area (Vmean): 1.3 cm^2. - Mitral valve: Calcified annulus. There was mild regurgitation. Valve area by continuity equation (using LVOT flow): 1.38 cm^2. - Left atrium: The atrium was severely dilated. - Right ventricle: Systolic function was mildly reduced. - Pericardium,  extracardiac: There was a left pleural effusion. Impressions: -  Severe global reduction in LV function; grade 2 diastolic dysfunction with elevated LV filling pressure; moderate LVE, severe LAE; calcified aortic valve with mild AS by continuity equation; visually AS not severe, mild MR; mildly reduced RV function. (Comparison EF 35-40% with moderate mid-distal anteroseptal hypokinesis 03/09/12, EF 30-35% with akinesis mid-distal anteroseptal hypokinesis.)  04/05/13 LHC (Pre-CABG): Left ventriculography: Left ventricular systolic function is severely reduced, LVEF is estimated at 15 %, there is mild mitral regurgitation which might be underestimated. There is akinesis of the mid/distal anterior and distal inferior walls Dyskinesis of the apex. Final Conclusions:  1. Acute anterior myocardial infarction with late presentation. 2. Severe left main and three-vessel coronary artery disease. The LAD is occluded with very little collaterals. 3. Severely reduced LV systolic function. 4. Severely elevated left ventricular end-diastolic pressure and borderline hypotension likely due to early cardiogenic shock. 5. Intra-aortic balloon placement. Recommendations: Emergent CABG.  02/24/15 Carotid duplex: Heterogeneous plaque, bilaterally. 80-99% bilateral ICA stenosis, increased from prior exam. Normal subclavian arteries bilaterally. Patent vertebral arteries with antegrade flow.     He is for labs on the day of surgery.  Case discussed with anesthesiologist Dr. Maple Hudson. He also called and spoke with Dr. Myra Gianotti. There are concerns about having patient undergo a procedure with anesthesia that will not immediately benefit him. It is still unclear if anesthesia could have played a role in his PEA arrest in July. There is at least discussion regarding weighing risk/benefits of doing AVF now versus waiting until he is ready to start hemodialysis and do a catheter combined with AVF at that time. Dr. Maple Hudson  will plan to assign himself to this case. He will evaluate patient. If it is ultimately decided to proceed as planned he will consider doing the procedure with an axillary block alone. Patient in on Plavix.  Velna Ochs [facility] Short Stay Center/Anesthesiology Phone [phone] 03/19/2015 2:13 PM  Addendum: Patient had requested that due to his cardiac arrest on 08/29/14 that he have a different anesthesia team for this surgery. I notified Luanna Salk, CRNA. I also spoke with Dr. Maple Hudson since he was involved with patient's care in July. He will speak with patient tomorrow to discuss options. Apparently, patient is a former employee with [employer] for 26 years.   Velna Ochs [facility] Short Stay Center/Anesthesiology Phone [phone] 03/19/2015 5:09 PM

## 2015-03-19 NOTE — Progress Notes (Signed)
Pt SDW-pre-op call completed by pt spouse, Brenson Hartman with consent. Spouse made aware to have pt stop otc vitamins, herbal medications, fish oil and NSAID's. Please measure pt neck on DOS to complete Apnea Screening Charlsie Merles, nursing staff at surgeon's office stated that pt can take both Aspirin and Plavix on morning of procedure. Spouse verbalized understanding of all pre-op instructions. Revonda Standard, [place], Anesthesia, made aware of pt history ( see note).

## 2015-03-20 ENCOUNTER — Ambulatory Visit (HOSPITAL_COMMUNITY): Payer: Medicare Other | Admitting: Emergency Medicine

## 2015-03-20 ENCOUNTER — Ambulatory Visit (HOSPITAL_COMMUNITY)
Admission: RE | Admit: 2015-03-20 | Discharge: 2015-03-20 | Disposition: A | Discharge status: Home / Self Care | Payer: Medicare Other | Source: Ambulatory Visit | Attending: Surgery | Admitting: Surgery

## 2015-03-20 ENCOUNTER — Other Ambulatory Visit: Payer: Self-pay | Admitting: *Deleted

## 2015-03-20 ENCOUNTER — Encounter (HOSPITAL_COMMUNITY)
Admission: RE | Disposition: A | Discharge status: Home / Self Care | Payer: Self-pay | Source: Ambulatory Visit | Attending: Surgery

## 2015-03-20 ENCOUNTER — Encounter (HOSPITAL_COMMUNITY): Payer: Self-pay | Admitting: *Deleted

## 2015-03-20 DIAGNOSIS — E1140 Type 2 diabetes mellitus with diabetic neuropathy, unspecified: Secondary | ICD-10-CM | POA: Diagnosis not present

## 2015-03-20 DIAGNOSIS — E7800 Pure hypercholesterolemia, unspecified: Secondary | ICD-10-CM | POA: Insufficient documentation

## 2015-03-20 DIAGNOSIS — Z7902 Long term (current) use of antithrombotics/antiplatelets: Secondary | ICD-10-CM | POA: Diagnosis not present

## 2015-03-20 DIAGNOSIS — Z951 Presence of aortocoronary bypass graft: Secondary | ICD-10-CM | POA: Diagnosis not present

## 2015-03-20 DIAGNOSIS — Z8674 Personal history of sudden cardiac arrest: Secondary | ICD-10-CM | POA: Diagnosis not present

## 2015-03-20 DIAGNOSIS — E1151 Type 2 diabetes mellitus with diabetic peripheral angiopathy without gangrene: Secondary | ICD-10-CM | POA: Diagnosis not present

## 2015-03-20 DIAGNOSIS — Z95820 Peripheral vascular angioplasty status with implants and grafts: Secondary | ICD-10-CM | POA: Diagnosis not present

## 2015-03-20 DIAGNOSIS — Z6830 Body mass index (BMI) 30.0-30.9, adult: Secondary | ICD-10-CM | POA: Diagnosis not present

## 2015-03-20 DIAGNOSIS — Z8673 Personal history of transient ischemic attack (TIA), and cerebral infarction without residual deficits: Secondary | ICD-10-CM | POA: Insufficient documentation

## 2015-03-20 DIAGNOSIS — I5022 Chronic systolic (congestive) heart failure: Secondary | ICD-10-CM | POA: Diagnosis not present

## 2015-03-20 DIAGNOSIS — Z87891 Personal history of nicotine dependence: Secondary | ICD-10-CM | POA: Insufficient documentation

## 2015-03-20 DIAGNOSIS — N186 End stage renal disease: Secondary | ICD-10-CM

## 2015-03-20 DIAGNOSIS — N184 Chronic kidney disease, stage 4 (severe): Secondary | ICD-10-CM | POA: Insufficient documentation

## 2015-03-20 DIAGNOSIS — I130 Hypertensive heart and chronic kidney disease with heart failure and stage 1 through stage 4 chronic kidney disease, or unspecified chronic kidney disease: Secondary | ICD-10-CM | POA: Insufficient documentation

## 2015-03-20 DIAGNOSIS — I255 Ischemic cardiomyopathy: Secondary | ICD-10-CM | POA: Insufficient documentation

## 2015-03-20 DIAGNOSIS — I252 Old myocardial infarction: Secondary | ICD-10-CM | POA: Diagnosis not present

## 2015-03-20 DIAGNOSIS — E1122 Type 2 diabetes mellitus with diabetic chronic kidney disease: Secondary | ICD-10-CM | POA: Diagnosis not present

## 2015-03-20 DIAGNOSIS — I2510 Atherosclerotic heart disease of native coronary artery without angina pectoris: Secondary | ICD-10-CM | POA: Diagnosis not present

## 2015-03-20 DIAGNOSIS — Z7951 Long term (current) use of inhaled steroids: Secondary | ICD-10-CM | POA: Insufficient documentation

## 2015-03-20 DIAGNOSIS — Z79899 Other long term (current) drug therapy: Secondary | ICD-10-CM | POA: Insufficient documentation

## 2015-03-20 DIAGNOSIS — Z4931 Encounter for adequacy testing for hemodialysis: Secondary | ICD-10-CM

## 2015-03-20 DIAGNOSIS — E669 Obesity, unspecified: Secondary | ICD-10-CM | POA: Insufficient documentation

## 2015-03-20 DIAGNOSIS — Z7982 Long term (current) use of aspirin: Secondary | ICD-10-CM | POA: Insufficient documentation

## 2015-03-20 DIAGNOSIS — N189 Chronic kidney disease, unspecified: Secondary | ICD-10-CM | POA: Diagnosis present

## 2015-03-20 HISTORY — DX: Major depressive disorder, single episode, unspecified: F32.9

## 2015-03-20 HISTORY — DX: Other complications of anesthesia, initial encounter: T88.59XA

## 2015-03-20 HISTORY — PX: AV FISTULA PLACEMENT: SHX1204

## 2015-03-20 HISTORY — DX: Adverse effect of unspecified anesthetic, initial encounter: T41.45XA

## 2015-03-20 HISTORY — DX: Depression, unspecified: F32.A

## 2015-03-20 LAB — GLUCOSE, CAPILLARY: Glucose-Capillary: 112 mg/dL — ABNORMAL HIGH (ref 65–99)

## 2015-03-20 LAB — POCT I-STAT 4, (NA,K, GLUC, HGB,HCT)
Glucose, Bld: 113 mg/dL — ABNORMAL HIGH (ref 65–99)
HEMATOCRIT: 29 % — AB (ref 39.0–52.0)
HEMOGLOBIN: 9.9 g/dL — AB (ref 13.0–17.0)
POTASSIUM: 4.5 mmol/L (ref 3.5–5.1)
Sodium: 145 mmol/L (ref 135–145)

## 2015-03-20 SURGERY — ARTERIOVENOUS (AV) FISTULA CREATION
Anesthesia: Monitor Anesthesia Care | Site: Arm Lower | Laterality: Left

## 2015-03-20 MED ORDER — LIDOCAINE-EPINEPHRINE (PF) 1 %-1:200000 IJ SOLN
INTRAMUSCULAR | Status: DC | PRN
Start: 2015-03-20 — End: 2015-03-20
  Administered 2015-03-20: 5 mL

## 2015-03-20 MED ORDER — MIDAZOLAM HCL 5 MG/5ML IJ SOLN
INTRAMUSCULAR | Status: DC | PRN
  Administered 2015-03-20 (×2): 1 mg via INTRAVENOUS

## 2015-03-20 MED ORDER — HEPARIN SODIUM (PORCINE) 5000 UNIT/ML IJ SOLN
INTRAMUSCULAR | Status: DC | PRN
  Administered 2015-03-20: 500 mL

## 2015-03-20 MED ORDER — HEPARIN SODIUM (PORCINE) 1000 UNIT/ML IJ SOLN
INTRAMUSCULAR | Status: DC | PRN
  Administered 2015-03-20: 3000 [IU] via INTRAVENOUS

## 2015-03-20 MED ORDER — PROPOFOL 10 MG/ML IV BOLUS
INTRAVENOUS | Status: AC
  Filled 2015-03-20: qty 20

## 2015-03-20 MED ORDER — PROTAMINE SULFATE 10 MG/ML IV SOLN
INTRAVENOUS | Status: DC | PRN
  Administered 2015-03-20: 25 mg via INTRAVENOUS

## 2015-03-20 MED ORDER — ROPIVACAINE HCL 5 MG/ML IJ SOLN
INTRAMUSCULAR | Status: DC | PRN
  Administered 2015-03-20: 20 mL via PERINEURAL

## 2015-03-20 MED ORDER — LIDOCAINE-EPINEPHRINE (PF) 1 %-1:200000 IJ SOLN
INTRAMUSCULAR | Status: AC
  Filled 2015-03-20: qty 30

## 2015-03-20 MED ORDER — FENTANYL CITRATE (PF) 250 MCG/5ML IJ SOLN
INTRAMUSCULAR | Status: AC
  Filled 2015-03-20: qty 5

## 2015-03-20 MED ORDER — PROPOFOL 500 MG/50ML IV EMUL
INTRAVENOUS | Status: DC | PRN
  Administered 2015-03-20: 50 ug/kg/min via INTRAVENOUS

## 2015-03-20 MED ORDER — FENTANYL CITRATE (PF) 100 MCG/2ML IJ SOLN
INTRAMUSCULAR | Status: DC | PRN
  Administered 2015-03-20 (×2): 25 ug via INTRAVENOUS

## 2015-03-20 MED ORDER — MIDAZOLAM HCL 2 MG/2ML IJ SOLN
INTRAMUSCULAR | Status: AC
  Filled 2015-03-20: qty 2

## 2015-03-20 MED ORDER — ONDANSETRON HCL 4 MG/2ML IJ SOLN
INTRAMUSCULAR | Status: DC | PRN
  Administered 2015-03-20: 4 mg via INTRAVENOUS

## 2015-03-20 MED ORDER — LIDOCAINE-EPINEPHRINE (PF) 1.5 %-1:200000 IJ SOLN
INTRAMUSCULAR | Status: DC | PRN
  Administered 2015-03-20: 10 mL via PERINEURAL

## 2015-03-20 MED ORDER — PROTAMINE SULFATE 10 MG/ML IV SOLN
INTRAVENOUS | Status: AC
  Filled 2015-03-20: qty 5

## 2015-03-20 MED ORDER — OXYCODONE-ACETAMINOPHEN 5-325 MG PO TABS: 1.0000 | ORAL_TABLET | Freq: Four times a day (QID) | ORAL | Status: AC | PRN

## 2015-03-20 MED ORDER — 0.9 % SODIUM CHLORIDE (POUR BTL) OPTIME
TOPICAL | Status: DC | PRN
  Administered 2015-03-20: 1000 mL

## 2015-03-20 SURGICAL SUPPLY — 36 items
ARMBAND PINK RESTRICT EXTREMIT (MISCELLANEOUS) ×3 IMPLANT
CANISTER SUCTION 2500CC (MISCELLANEOUS) ×3 IMPLANT
CLIP TI MEDIUM 6 (CLIP) ×3 IMPLANT
CLIP TI WIDE RED SMALL 6 (CLIP) ×3 IMPLANT
COVER PROBE W GEL 5X96 (DRAPES) ×3 IMPLANT
ELECT REM PT RETURN 9FT ADLT (ELECTROSURGICAL) ×3
ELECTRODE REM PT RTRN 9FT ADLT (ELECTROSURGICAL) ×1 IMPLANT
GLOVE BIO SURGEON STRL SZ 6.5 (GLOVE) ×2 IMPLANT
GLOVE BIO SURGEONS STRL SZ 6.5 (GLOVE) ×2
GLOVE BIOGEL PI IND STRL 6.5 (GLOVE) IMPLANT
GLOVE BIOGEL PI IND STRL 7.5 (GLOVE) ×1 IMPLANT
GLOVE BIOGEL PI IND STRL 8 (GLOVE) IMPLANT
GLOVE BIOGEL PI INDICATOR 6.5 (GLOVE) ×2
GLOVE BIOGEL PI INDICATOR 7.5 (GLOVE) ×4
GLOVE BIOGEL PI INDICATOR 8 (GLOVE) ×2
GLOVE ECLIPSE 6.5 STRL STRAW (GLOVE) ×2 IMPLANT
GLOVE SURG SS PI 7.0 STRL IVOR (GLOVE) ×2 IMPLANT
GLOVE SURG SS PI 7.5 STRL IVOR (GLOVE) ×3 IMPLANT
GOWN STRL REUS W/ TWL LRG LVL3 (GOWN DISPOSABLE) ×2 IMPLANT
GOWN STRL REUS W/ TWL XL LVL3 (GOWN DISPOSABLE) ×1 IMPLANT
GOWN STRL REUS W/TWL LRG LVL3 (GOWN DISPOSABLE) ×9
GOWN STRL REUS W/TWL XL LVL3 (GOWN DISPOSABLE) ×3
HEMOSTAT SNOW SURGICEL 2X4 (HEMOSTASIS) IMPLANT
KIT BASIN OR (CUSTOM PROCEDURE TRAY) ×3 IMPLANT
KIT ROOM TURNOVER OR (KITS) ×3 IMPLANT
LIQUID BAND (GAUZE/BANDAGES/DRESSINGS) ×3 IMPLANT
NS IRRIG 1000ML POUR BTL (IV SOLUTION) ×3 IMPLANT
PACK CV ACCESS (CUSTOM PROCEDURE TRAY) ×3 IMPLANT
PAD ARMBOARD 7.5X6 YLW CONV (MISCELLANEOUS) ×6 IMPLANT
SUT PROLENE 6 0 CC (SUTURE) ×3 IMPLANT
SUT PROLENE 7 0 BV175 8 (SUTURE) ×2 IMPLANT
SUT VIC AB 3-0 SH 27 (SUTURE) ×3
SUT VIC AB 3-0 SH 27X BRD (SUTURE) ×1 IMPLANT
SUT VICRYL 4-0 PS2 18IN ABS (SUTURE) IMPLANT
UNDERPAD 30X30 INCONTINENT (UNDERPADS AND DIAPERS) ×3 IMPLANT
WATER STERILE IRR 1000ML POUR (IV SOLUTION) ×3 IMPLANT

## 2015-03-20 NOTE — Transfer of Care (Signed)
Immediate Anesthesia Transfer of Care Note  Patient: Kurt Richard  Procedure(s) Performed: Procedure(s) with comments: CREATION OF LEFT ARM RADIO-CEPHALIC  ARTERIOVENOUS (AV) FISTULA  (Left) - block  Patient Location: PACU  Anesthesia Type:MAC and Regional  Level of Consciousness: awake, alert  and oriented  Airway & Oxygen Therapy: Patient Spontanous Breathing and Patient connected to nasal cannula oxygen  Post-op Assessment: Report given to RN and Post -op Vital signs reviewed and stable  Post vital signs: Reviewed and stable  Last Vitals:  Filed Vitals:   03/20/15 0602 03/20/15 0855  BP: 169/73   Pulse: 75   Temp: 36.8 C 36.4 C  Resp: 16     Complications: No apparent anesthesia complications

## 2015-03-20 NOTE — Anesthesia Procedure Notes (Signed)
Anesthesia Regional Block:  Axillary brachial plexus block  Pre-Anesthetic Checklist: ,, timeout performed, Correct Patient, Correct Site, Correct Laterality, Correct Procedure, Correct Position, site marked, Risks and benefits discussed, Surgical consent,  Pre-op evaluation,  At surgeon's request  Laterality: Upper and Left  Prep: chloraprep       Needles:  Injection technique: Single-shot  Needle Type: Echogenic Needle          Additional Needles:  Procedures: ultrasound guided (picture in chart) Axillary brachial plexus block Narrative:  Injection made incrementally with aspirations every 5 mL.  Performed by: Personally   Additional Notes: H+P and labs reviewed, risks and benefits discussed with patient, procedure tolerated well without complications

## 2015-03-20 NOTE — OR Nursing (Signed)
Patient stated that he is  not allergic to heparin.Verified to pharmacy on duty, ok to give heparin saline irrigation.

## 2015-03-20 NOTE — H&P (Signed)
Patient name: Kurt Eshbach WalkerMRN: [id number] DOB: [birth year]Sex: male    Chief Complaint  Patient presents with  . Follow-up    3 month evaluation - per patient his kidneys are still functioning and Dr. Hyman Hopes has not said anymore about the need for HD access.     HISTORY OF PRESENT ILLNESS: The patient comes in today for Follow-up. He is right-hand dominant. He is not yet on dialysis. His renal failure secondary to hypertension and diabetes.  The patient recently underwent an attempt at right carotid endarterectomy. The case was aborted early on before the carotid was exposed because he lost a pulse and required approximately 5 minutes of chest compressions. He was ultimately resuscitated and recovered from his operation without any significant neurologic deficit. The exact underlying cause of his cardiac arrest is unclear at this time.  The patient suffers from hypercholesterolemia which is managed with a statin. He is a diet-controlled diabetic with hemoglobin A1c is in the 6 range. He has stage IV renal insufficiency and is being evaluated for dialysis in the next month. He takes injections for anemia. He is a former smoker having quit 3 years ago. He suffers from peripheral vascular disease and has undergone stenting in his left lower extremity 2 years ago. He is on Plavix for this. He also has coronary artery disease. He has never had a heart attack but did have CABG in 2013. He states that around the time of his CABG he had a CT scan for dysarthria which showed a chronic right PCA infarct. The patient has chronic diarrhea which is very severe.  Past Medical History  Diagnosis Date  . Diabetes mellitus   . Hypertension   . Chronic systolic heart failure (HCC)     echo 04/12/11: Mild LVH, EF 30-35%, mid to distal anteroseptal and apical hypokinesis, grade 2 diastolic dysfunction, moderate LAE, mild RAE.  [name]  Hyperlipidemia   . Tobacco abuse   . CAD (coronary artery disease)     acute anterior STEMI, late presentation 04/06/11 LHC 3/5 demonstrated severe ostial left main 95% stenosis and otherwise three-vessel CAD with an ejection fraction of 15%. Emergent CABG: Dr. Dorris Fetch - Grafts: LIMA-LAD, SVG-D1, SVG-OM1, SVG-PDA and PL.  . Ischemic cardiomyopathy   . DM2 (diabetes mellitus, type 2) (HCC)   . 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 Anemia     Post-CABG  . Arthritis   . Stroke (HCC)   . Neuromuscular disorder (HCC)     diabetic neuropathy  . Diarrhea     has had c diff diarrhea  . Weakness   . Bruises easily   . PAD (peripheral artery disease) (HCC) 07/28/2012    Angiography 08/2012: Right: 90% focal stenosis in external iliac artery, long occlusion of SFA with 3 vessel run off . s/p slef expanding stent placement to external iliac artery. Left: borderline significant disease in comon and external iliac arteries. Diffuse 70-90% in SFA with 3 vessel run off.   . Bilateral carotid artery disease (HCC)   . Difficult intubation     PATIENT STATED HERNIATED 2 DISCS @[facility]  HAD TO HAVE CERVICAL SURGERY  . CHF (congestive heart failure) (HCC)   . Acute renal insufficiency     05/2011 admission (cr 1.54 at discharge), WILL START HD IN FALL     Past Surgical History  Procedure Laterality Date  . Coronary artery bypass graft  04/06/2011    Procedure: CORONARY ARTERY BYPASS GRAFTING (CABG); Surgeon: Viviann Spare  Lars Pinks, MD; Location: [facility] OR; Service: Open Heart Surgery; Laterality: N/A; Coronary Artery Bypass Graft times four utilizing the left internal mammary artery and the Right and left greater Saphenous veins Harvested endoscopically.  . Fibroid bypass 04/06/11    . Colon surgery      rectal fissure  . Cervical disc surgery  1997 - approximate  . Colonoscopy  07/29/2011     Procedure: COLONOSCOPY; Surgeon: Rachael Fee, MD; Location: [facility] END[facility]COPY; Service: Endoscopy; Laterality: N/A;  . Eye surgery  2011    cataract removal  . Left heart catheterization with coronary angiogram N/A 04/06/2011    Procedure: LEFT HEART CATHETERIZATION WITH CORONARY ANGIOGRAM; Surgeon: Iran Ouch, MD; Location: [facility] CATH LAB; Service: Cardiovascular; Laterality: N/A;  . Abdominal aortagram N/A 08/02/2012    Procedure: ABDOMINAL Ronny Flurry; Surgeon: Iran Ouch, MD; Location: [facility] CATH LAB; Service: Cardiovascular; Laterality: N/A;  . Percutaneous stent intervention Left 08/02/2012    Procedure: PERCUTANEOUS STENT INTERVENTION; Surgeon: Iran Ouch, MD; Location: [facility] CATH LAB; Service: Cardiovascular; Laterality: Left; stent x1 to lt ext iliac artery  . Lower extremity angiogram Bilateral 08/02/2012    Procedure: LOWER EXTREMITY ANGIOGRAM; Surgeon: Iran Ouch, MD; Location: [facility] CATH LAB; Service: Cardiovascular; Laterality: Bilateral;  . Ercp      X2  . Endarterectomy Right 08/29/2014    Procedure: Attempted ENDARTERECTOMY CAROTID; Surgeon: Nada Libman, MD; Location: [facility] OR; Service: Vascular; Laterality: Right;    Social History   Social History  . Marital Status: Single    Spouse Name: N/A  . Number of Children: 0  . Years of Education: N/A   Occupational History  . Not on file.   Social History Main Topics  . Smoking status: Former Smoker -- 0.50 packs/day    Types: Cigarettes    Quit date: 04/06/2011  . Smokeless tobacco: Never Used  . Alcohol Use: No  . Drug Use: No  . Sexual Activity: No   Other Topics Concern  . Not on file   Social History Narrative    Family History  Problem Relation Age of Onset  . Heart attack Mother     ?63s  . Diabetes Mother   . Heart disease Mother   .  Hyperlipidemia Mother   . Hypertension Mother   . Peripheral vascular disease Mother   . Heart attack Sister     73s  . Deep vein thrombosis Sister   . Diabetes Sister     Dietitian  . Heart disease Sister   . Hyperlipidemia Sister   . Hypertension Sister   . Congestive Heart Failure Father   . Heart disease Father   . Diabetes Brother   . Heart disease Brother   . Hyperlipidemia Brother     Allergies as of 12/25/2014 - Review Complete 12/25/2014  Allergen Reaction Noted  . Diltiazem hcl Hives and Swelling 04/06/2011  . Nifedipine Hives and Swelling 04/06/2011    Current Outpatient Prescriptions on File Prior to Visit  Medication Sig Dispense Refill  . aspirin 81 MG EC tablet Take 1 tablet (81 mg total) by mouth daily.    [name] atorvastatin (LIPITOR) 20 MG tablet TAKE 1 TABLET EVERY DAY 30 tablet 5  . calcitRIOL (ROCALTROL) 0.25 MCG capsule Take 0.25 mcg by mouth daily.     . carvedilol (COREG) 25 MG tablet Take 1 tablet (25 mg total) by mouth 2 (two) times daily with a meal. 180 tablet 3  . clopidogrel (PLAVIX) 75 MG tablet Take 1  tablet (75 mg total) by mouth daily. 90 tablet 3  . diphenoxylate-atropine (LOMOTIL) 2.5-0.025 MG tablet TAKE 1 TABLET BY MOUTH 4 TIMES DAILY AS NEEDED FOR DIARRHEA 90 tablet 0  . epoetin alfa (EPOGEN,PROCRIT) 3000 UNIT/ML injection Inject 3,000 Units into the skin once. Take on wednesdays    . fluticasone (FLONASE) 50 MCG/ACT nasal spray Place 2 sprays into the nose daily.    . furosemide (LASIX) 40 MG tablet TAKE ONE TABLET DAILY AND MAY TAKE ONE EXTRA AS NEEDED 110 tablet 3  . glucose blood (ONETOUCH VERIO) test strip Use as instructed. DX: 250.00 100 each 3  . hydrALAZINE (APRESOLINE) 25 MG tablet Take 1 tablet (25 mg total) by mouth 3 (three) times daily. 270 tablet 3  . isosorbide dinitrate (ISORDIL) 20 MG tablet  Take 1 tablet (20 mg total) by mouth 3 (three) times daily. 270 tablet 3  . nitroGLYCERIN (NITROSTAT) 0.4 MG SL tablet Place 1 tablet (0.4 mg total) under the tongue every 5 (five) minutes as needed for chest pain. 25 tablet 6  . ONETOUCH DELICA LANCETS FINE MISC Use as instructed. DX: 250.00 100 each 3  . oxyCODONE-acetaminophen (PERCOCET/ROXICET) 5-325 MG per tablet Take 1 tablet by mouth every 6 (six) hours as needed. 30 tablet 0  . paregoric 2 MG/5ML solution TAKE 1 TEASPOONFUL BY MOUTH 4 TIMES A DAY AS NEEDED FOR DIARRHEA OR LOOSE STOOLS 120 mL 0  . sodium bicarbonate 650 MG tablet Take 1,300 mg by mouth 2 (two) times daily.  12   No current facility-administered medications on file prior to visit.     REVIEW OF SYSTEMS: please see history of present illness for pertinent positives and negatives.  The patient continues to have difficulty with his memory. He has chronic diarrhea. He has not had any chest pain or shortness of breath.  PHYSICAL EXAMINATION:  Vital signs are  Filed Vitals:   12/25/14 1001 12/25/14 1004  BP: 171/74 139/63  Pulse: 68 68  Temp: 97.4 F (36.3 C)   TempSrc: Oral   Resp: 16   Height:  (1.727 m)   Weight: 199 lb (90.266 kg)   SpO2: 96%    Body mass index is 30.26 kg/(m^2). General: The patient appears their stated age. HEENT: No gross abnormalities Pulmonary: Non labored breathing Musculoskeletal: There are no major deformities. Neurologic: No focal weakness or paresthesias are detected, Skin: There are no ulcer or rashes noted. Psychiatric: The patient has normal affect. Cardiovascular: There is a regular rate and rhythm without significant murmur appreciated.  Palpable radial pulse  Diagnostic Studies I have gone back and reviewed his vein mapping. 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  Assessment: #1: Chronic renal insufficiency, stageIV #2: Asymptomatic  right carotid stenosis Plan: #1: The patient is scheduled to see Dr. Hyman Hopes next month and would like to discuss things with him before scheduling surgery for his fistula. I did discuss the think he would be a good candidate for a left radiocephalic fistula. We discussed the risk of non-maturity and the risk of steal syndrome. He will contact me when he is ready to have this scheduled. I do not need to see him again before his operation.  #2: I feel that the patient is not a good open surgical candidate. I think you be best treated with a carotid stent. However his renal insufficiency complicates this matter. I think if we proceeded with carotid stenting he would start dialysis because the contrast load. Therefore, I  have elected to treat him nonoperatively. I will have him follow-up in 6 months with a repeat carotid ultrasound. If he ends up requiring dialysis, I will proceed with carotid stenting. If his ultrasound progresses, I will have to reevaluate whether or not he would be a candidate for surgery.  Jorge Ny, M.D. [facility] Office: [phone] Pager: [phone]        Decided to proceed with AVF Anesthesia planning on blocl Plan left wrist avf PE: CV RRR Pulm CTA Neuro intact  WB

## 2015-03-20 NOTE — Op Note (Signed)
    Patient name: Kurt Richard MRN: [id number] DOB: May 11, 1945 Sex: male  03/20/2015 Pre-operative Diagnosis: Chronic renal insufficiency Post-operative diagnosis:  Same Surgeon:  Durene Cal Assistants:  Doreatha Massed Procedure:   Left radiocephalic fistula Anesthesia:  Shoulder block Blood Loss:  See anesthesia record Specimens:  None  Findings:  Artery was 1. 5-2 millimeters.  The vein was 3 mm  Indications:  Patient comes in for fistula creation.  He is not yet on dialysis.  This was done under a shoulder block because the patient has a history of cardiac arrest at the time of attempted carotid endarterectomy.  Procedure:  The patient was identified in the holding area and taken to [facility] OR ROOM 12  The patient was then placed supine on the table. regional anesthesia was administered.  The patient was prepped and draped in the usual sterile fashion.  A time out was called and antibiotics were administered.  Ultrasound was used to evaluate the cephalic vein.  It appeared to be adequate throughout the upper arm and forearm.  A longitudinal incision was made at the level of the distal radius.  Through this incision the cephalic vein was dissected free.  It measured approximately 3 mm.  Side branches were divided between silk ties.  The vein was mobilized throughout the length of the incision.  Next the radial artery was dissected free.  This was a rather small artery approximately 1. 5-2 millimeters.  3000 units of heparin was administered.  The vein was marked for orientation and then ligated distally.  It was distended with heparinized saline.  The radial artery was then occluded with vascular clamps.  A #11 blade was used to make an arteriotomy which was extended longitudinally with Potts scissors.  The vein was cut to the appropriate length and then spatulated to fit the size of the arteriotomy.  A running anastomosis was created with 6-0 Prolene.  Prior to completion, the appropriate flushing  maneuvers were performed and the anastomosis was completed.  There was a palpable thrill within the fistula and a Doppler signal in the radial and ulnar artery.  I inspected the course of the vein to make sure there were no kinks.  25 mg of protamine was given.  Once hemostasis was satisfactory the incision was closed with 2 layers of 3-0 Vicryl.   Disposition:  To PACU in stable condition.   Juleen China, M.D. [facility] Office: [phone] Pager:  [phone]

## 2015-03-20 NOTE — Discharge Instructions (Signed)
° ° °  03/20/2015 Kurt Richard [id number] January 28, 1946  Surgeon(s): Nada Libman, MD  Procedure(s): CREATION OF LEFT ARM RADIO-CEPHALIC  ARTERIOVENOUS (AV) FISTULA   x Do not stick fistula for 12 weeks

## 2015-03-20 NOTE — Anesthesia Preprocedure Evaluation (Addendum)
Anesthesia Evaluation  Patient identified by MRN, date of birth, ID band Patient awake    Reviewed: Allergy & Precautions, NPO status , Patient's Chart, lab work & pertinent test results  History of Anesthesia Complications (+) DIFFICULT AIRWAY and history of anesthetic complications  Airway Mallampati: IV  TM Distance: >3 FB Neck ROM: Limited    Dental  (+) Teeth Intact   Pulmonary neg shortness of breath, neg sleep apnea, neg COPD, neg recent URI, former smoker,    breath sounds clear to auscultation       Cardiovascular hypertension, Pt. on medications and Pt. on home beta blockers + CAD, + CABG, + Peripheral Vascular Disease and +CHF   Rhythm:Regular + Systolic murmurs    Neuro/Psych  Neuromuscular disease CVA, No Residual Symptoms    GI/Hepatic negative GI ROS, Neg liver ROS,   Endo/Other  diabetes, Type 2  Renal/GU CRFRenal disease     Musculoskeletal   Abdominal   Peds  Hematology  (+) anemia ,   Anesthesia Other Findings   Reproductive/Obstetrics                            Anesthesia Physical  Anesthesia Plan  ASA: III  Anesthesia Plan: MAC and Regional   Post-op Pain Management:    Induction: Intravenous  Airway Management Planned: Simple Face Mask  Additional Equipment:   Intra-op Plan:   Post-operative Plan:   Informed Consent: I have reviewed the patients History and Physical, chart, labs and discussed the procedure including the risks, benefits and alternatives for the proposed anesthesia with the patient or authorized representative who has indicated his/her understanding and acceptance.   Dental advisory given  Plan Discussed with: CRNA, Surgeon and Anesthesiologist  Anesthesia Plan Comments:         Anesthesia Quick Evaluation

## 2015-03-21 ENCOUNTER — Telehealth: Payer: Self-pay | Admitting: Surgery

## 2015-03-21 NOTE — Telephone Encounter (Signed)
Spoke with pt re appt, dpm  °

## 2015-03-21 NOTE — Telephone Encounter (Signed)
-----   Message from Sharee Pimple, RN sent at 03/20/2015  9:48 AM EST ----- Regarding: schedule   ----- Message -----    From: Dara Lords, PA-C    Sent: 03/20/2015   8:41 AM      To: [facility] Charge Pool  S/p left RC AVF 03/20/15.  F/u with Dr. Myra Gianotti in 6 weeks with duplex.  Thanks, Lelon Mast

## 2015-03-22 NOTE — Anesthesia Postprocedure Evaluation (Signed)
Anesthesia Post Note  Patient: Kurt Richard  Procedure(s) Performed: Procedure(s) (LRB): CREATION OF LEFT ARM RADIO-CEPHALIC  ARTERIOVENOUS (AV) FISTULA  (Left)  Patient location during evaluation: PACU Anesthesia Type: MAC Level of consciousness: awake Pain management: pain level controlled Vital Signs Assessment: post-procedure vital signs reviewed and stable Respiratory status: spontaneous breathing and respiratory function stable Cardiovascular status: stable Postop Assessment: no signs of nausea or vomiting Anesthetic complications: no    Last Vitals:  Filed Vitals:   03/20/15 1044 03/20/15 1047  BP:  109/51  Pulse:  67  Temp: 36.4 C 36.4 C  Resp:  14    Last Pain:  Filed Vitals:   03/20/15 1050  PainSc: 0-No pain                 Kyen Taite

## 2015-03-24 ENCOUNTER — Encounter (HOSPITAL_COMMUNITY): Payer: Self-pay | Admitting: Surgery

## 2015-03-26 ENCOUNTER — Inpatient Hospital Stay (HOSPITAL_COMMUNITY): Admission: RE | Admit: 2015-03-26 | Payer: Medicare Other | Source: Ambulatory Visit

## 2015-03-27 ENCOUNTER — Encounter (HOSPITAL_COMMUNITY)
Admission: RE | Admit: 2015-03-27 | Discharge: 2015-03-27 | Disposition: A | Discharge status: Home / Self Care | Payer: Medicare Other | Source: Ambulatory Visit | Attending: Nephrology | Admitting: Nephrology

## 2015-03-27 DIAGNOSIS — D631 Anemia in chronic kidney disease: Secondary | ICD-10-CM | POA: Insufficient documentation

## 2015-03-27 DIAGNOSIS — N183 Chronic kidney disease, stage 3 (moderate): Secondary | ICD-10-CM | POA: Diagnosis not present

## 2015-03-27 LAB — FERRITIN: FERRITIN: 414 ng/mL — AB (ref 24–336)

## 2015-03-27 LAB — IRON AND TIBC
Iron: 51 ug/dL (ref 45–182)
Saturation Ratios: 21 % (ref 17.9–39.5)
TIBC: 248 ug/dL — ABNORMAL LOW (ref 250–450)
UIBC: 197 ug/dL

## 2015-03-27 LAB — POCT HEMOGLOBIN-HEMACUE: Hemoglobin: 9.7 g/dL — ABNORMAL LOW (ref 13.0–17.0)

## 2015-03-27 MED ORDER — EPOETIN ALFA 10000 UNIT/ML IJ SOLN
INTRAMUSCULAR | Status: AC
  Filled 2015-03-27: qty 1

## 2015-03-27 MED ORDER — EPOETIN ALFA 10000 UNIT/ML IJ SOLN
5000.0000 [IU] | INTRAMUSCULAR | Status: DC
  Administered 2015-03-27: 5000 [IU] via SUBCUTANEOUS

## 2015-04-07 ENCOUNTER — Encounter (HOSPITAL_COMMUNITY): Payer: Self-pay | Admitting: Emergency Medicine

## 2015-04-07 ENCOUNTER — Emergency Department (HOSPITAL_COMMUNITY)
Admission: EM | Admit: 2015-04-07 | Discharge: 2015-05-03 | Disposition: E | Payer: Medicare Other | Attending: Emergency Medicine | Admitting: Emergency Medicine

## 2015-04-07 DIAGNOSIS — Z79899 Other long term (current) drug therapy: Secondary | ICD-10-CM | POA: Insufficient documentation

## 2015-04-07 DIAGNOSIS — Z9889 Other specified postprocedural states: Secondary | ICD-10-CM | POA: Insufficient documentation

## 2015-04-07 DIAGNOSIS — E785 Hyperlipidemia, unspecified: Secondary | ICD-10-CM | POA: Diagnosis not present

## 2015-04-07 DIAGNOSIS — Z951 Presence of aortocoronary bypass graft: Secondary | ICD-10-CM | POA: Insufficient documentation

## 2015-04-07 DIAGNOSIS — I252 Old myocardial infarction: Secondary | ICD-10-CM | POA: Insufficient documentation

## 2015-04-07 DIAGNOSIS — Z7982 Long term (current) use of aspirin: Secondary | ICD-10-CM | POA: Insufficient documentation

## 2015-04-07 DIAGNOSIS — Z87891 Personal history of nicotine dependence: Secondary | ICD-10-CM | POA: Diagnosis not present

## 2015-04-07 DIAGNOSIS — Z7901 Long term (current) use of anticoagulants: Secondary | ICD-10-CM | POA: Insufficient documentation

## 2015-04-07 DIAGNOSIS — Z87448 Personal history of other diseases of urinary system: Secondary | ICD-10-CM | POA: Diagnosis not present

## 2015-04-07 DIAGNOSIS — M1990 Unspecified osteoarthritis, unspecified site: Secondary | ICD-10-CM | POA: Insufficient documentation

## 2015-04-07 DIAGNOSIS — I10 Essential (primary) hypertension: Secondary | ICD-10-CM | POA: Insufficient documentation

## 2015-04-07 DIAGNOSIS — I2510 Atherosclerotic heart disease of native coronary artery without angina pectoris: Secondary | ICD-10-CM | POA: Insufficient documentation

## 2015-04-07 DIAGNOSIS — I469 Cardiac arrest, cause unspecified: Secondary | ICD-10-CM | POA: Diagnosis not present

## 2015-04-07 DIAGNOSIS — R140 Abdominal distension (gaseous): Secondary | ICD-10-CM | POA: Diagnosis not present

## 2015-04-07 DIAGNOSIS — Z862 Personal history of diseases of the blood and blood-forming organs and certain disorders involving the immune mechanism: Secondary | ICD-10-CM | POA: Diagnosis not present

## 2015-04-07 DIAGNOSIS — Z8669 Personal history of other diseases of the nervous system and sense organs: Secondary | ICD-10-CM | POA: Insufficient documentation

## 2015-04-07 DIAGNOSIS — E119 Type 2 diabetes mellitus without complications: Secondary | ICD-10-CM | POA: Insufficient documentation

## 2015-04-07 MED ORDER — EPINEPHRINE HCL 0.1 MG/ML IJ SOSY
PREFILLED_SYRINGE | INTRAMUSCULAR | Status: AC | PRN
  Administered 2015-04-07: 1 via INTRAVENOUS

## 2015-04-07 NOTE — Code Documentation (Signed)
Family at beside. Family given emotional support. 

## 2015-04-07 NOTE — Progress Notes (Signed)
RT at bedside to assist MD with intubation. TOD [id number]

## 2015-04-07 NOTE — Progress Notes (Signed)
Chaplain was paged to escort family back for deceased Pt. The partner was at peace. Chplain also went back with Dr. To inform partner of the Pt condition. Chaplain taked with partner about the Pt and other life ideas.    04/06/2015 2300  Clinical Encounter Type  Visited With Family  Visit Type Spiritual support  Referral From Care management  Spiritual Encounters  Spiritual Needs Emotional  Stress Factors  Family Stress Factors Loss

## 2015-04-07 NOTE — Code Documentation (Signed)
Irick MD intubating

## 2015-04-07 NOTE — ED Notes (Signed)
Cardiac echo completed at bedside by Dr. Clydene PughKnott

## 2015-04-07 NOTE — ED Provider Notes (Signed)
CSN: [id number]     Arrival date & time 04/26/2015  2222 History   First MD Initiated Contact with Patient 04/23/2015 2238     CC: Cardiac arrest   HPI  Patient collapsed at home. He was found to be in cardiac arrest. 7 rounds of epinephrine and continuous CPR given prior to arrival. EMS unable to intubate. Patient presents with [facility] airway in place.  Level V exception due to patient's condition of cardiac arrest.   Past Medical History  Diagnosis Date  . Diabetes mellitus   . Hypertension   . Chronic systolic heart failure (HCC)     echo 04/12/11: Mild LVH, EF 30-35%, mid to distal anteroseptal and apical hypokinesis, grade 2 diastolic dysfunction, moderate LAE, mild RAE.  [name] Hyperlipidemia   . Tobacco abuse   . CAD (coronary artery disease)     acute anterior STEMI, late presentation 04/06/11 LHC 3/5 demonstrated severe ostial left main 95% stenosis and otherwise three-vessel CAD with an ejection fraction of 15%.  Emergent CABG: Dr. Dorris Fetch - Grafts: LIMA-LAD, SVG-D1, SVG-OM1, SVG-PDA and PL.  . Ischemic cardiomyopathy   . DM2 (diabetes mellitus, type 2) (HCC)   . 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 Anemia     Post-CABG  . Arthritis   . Stroke (HCC)   . Neuromuscular disorder (HCC)     diabetic neuropathy  . Diarrhea     has had c diff diarrhea  . Weakness   . Bruises easily   . PAD (peripheral artery disease) (HCC) 07/28/2012    Angiography 08/2012: Right: 90% focal stenosis in external iliac artery, long occlusion of SFA with 3 vessel run off . s/p slef expanding stent placement to external iliac artery. Left: borderline significant disease in comon and external iliac arteries. Diffuse 70-90% in SFA with 3 vessel run off.   . Bilateral carotid artery disease (HCC)   . CHF (congestive heart failure) (HCC)   . Acute renal insufficiency     05/2011 admission (cr 1.54 at discharge), WILL START HD IN [place]ALL   . Complication of anesthesia     08/29/14 PEA arrest during carotid  endarterectomy (see anesthesia records that day and 03/19/15 anesthesia note)  . Difficult intubation     c-spine surgery was told he was a difficult intubation; glidescope used 08/29/14  . Depression    Past Surgical History  Procedure Laterality Date  . Coronary artery bypass graft  04/06/2011    Procedure: CORONARY ARTERY BYPASS GRAFTING (CABG);  Surgeon: Loreli Slot, MD;  Location: [facility] OR;  Service: Open Heart Surgery;  Laterality: N/A;  Coronary Artery Bypass Graft times four utilizing the left internal mammary artery and the Right and left  greater Saphenous veins Harvested endoscopically.  . Fibroid bypass 04/06/11    . Colon surgery      rectal fissure  . Cervical disc surgery  1997 - approximate  . Colonoscopy  07/29/2011    Procedure: COLONOSCOPY;  Surgeon: Rachael Fee, MD;  Location: [facility] ENDOSCOPY;  Service: Endoscopy;  Laterality: N/A;  . Eye surgery  2011    cataract removal  . Left heart catheterization with coronary angiogram N/A 04/06/2011    Procedure: LEFT HEART CATHETERIZATION WITH CORONARY ANGIOGRAM;  Surgeon: Iran Ouch, MD;  Location: [facility] CATH LAB;  Service: Cardiovascular;  Laterality: N/A;  . Abdominal aortagram N/A 08/02/2012    Procedure: ABDOMINAL Ronny Flurry;  Surgeon: Iran Ouch, MD;  Location: [facility] CATH LAB;  Service:  Cardiovascular;  Laterality: N/A;  . Percutaneous stent intervention Left 08/02/2012    Procedure: PERCUTANEOUS STENT INTERVENTION;  Surgeon: Iran OuchMuhammad A Arida, MD;  Location: [facility] CATH LAB;  Service: Cardiovascular;  Laterality: Left;  stent x1 to lt ext iliac artery  . Lower extremity angiogram Bilateral 08/02/2012    Procedure: LOWER EXTREMITY ANGIOGRAM;  Surgeon: Iran OuchMuhammad A Arida, MD;  Location: [facility] CATH LAB;  Service: Cardiovascular;  Laterality: Bilateral;  . Ercp      X2  . Endarterectomy Right 08/29/2014    Procedure: Attempted ENDARTERECTOMY CAROTID;  Surgeon: Nada LibmanVance W Brabham, MD;  [facility];  Service: Vascular;  Laterality: Right;   . Av fistula placement Left 03/20/2015    Procedure: CREATION OF LEFT ARM RADIO-CEPHALIC  ARTERIOVENOUS (AV) FISTULA ;  Surgeon: Nada LibmanVance W Brabham, MD;  Location: [facility] OR;  Service: Vascular;  Laterality: Left;  block   Family History  Problem Relation Age of Onset  . Heart attack Mother     ?7550s  . Diabetes Mother   . Heart disease Mother   . Hyperlipidemia Mother   . Hypertension Mother   . Peripheral vascular disease Mother   . Heart attack Sister     6140s  . Deep vein thrombosis Sister   . Diabetes Sister     [place]  . Heart disease Sister   . Hyperlipidemia Sister   . Hypertension Sister   . Congestive Heart Failure Father   . Heart disease Father   . Diabetes Brother   . Heart disease Brother   . Hyperlipidemia Brother    Social History  Substance Use Topics  . Smoking status: Former Smoker -- 0.50 packs/day    Types: Cigarettes    Quit date: 04/06/2011  . Smokeless tobacco: Never Used  . Alcohol Use: No    Review of Systems  Unable to perform ROS: Patient unresponsive      Allergies  Diltiazem hcl and Nifedipine  Home Medications   Prior to Admission medications   Medication Sig Start Date End Date Taking? Authorizing Provider  aspirin 81 MG EC tablet Take 1 tablet (81 mg total) by mouth daily. 08/02/12   Rhonda G Barrett, PA-C  atorvastatin (LIPITOR) 20 MG tablet TAKE 1 TABLET EVERY DAY 05/24/14   Kristian CoveyBruce W Burchette, MD  calcitRIOL (ROCALTROL) 0.25 MCG capsule Take 0.25 mcg by mouth daily.     Historical Provider, MD  carvedilol (COREG) 25 MG tablet Take 1 tablet (25 mg total) by mouth 2 (two) times daily with a meal. 01/09/15   Vesta MixerPhilip J Nahser, MD  clopidogrel (PLAVIX) 75 MG tablet Take 1 tablet (75 mg total) by mouth daily. 10/09/14   Vesta MixerPhilip J Nahser, MD  diphenoxylate-atropine (LOMOTIL) 2.5-0.025 MG tablet Take 1 tablet by mouth 3 (three) times daily. Try to limit to only 3 tablets daily. 02/24/15   Kristian CoveyBruce W Burchette, MD  epoetin alfa (EPOGEN,PROCRIT) 3000  UNIT/ML injection Inject 3,000 Units into the skin every 30 (thirty) days. Take on wednesdays    Historical Provider, MD  fluticasone (FLONASE) 50 MCG/ACT nasal spray Place 2 sprays into the nose daily as needed for allergies.     Historical Provider, MD  furosemide (LASIX) 40 MG tablet TAKE ONE TABLET DAILY AND MAY TAKE ONE EXTRA AS NEEDED Patient taking differently: TAKE ONE TABLET DAILY AND MAY TAKE ONE EXTRA AS NEEDED FOR FLUID 02/28/14   Vesta MixerPhilip J Nahser, MD  glucose blood (ONETOUCH VERIO) test strip Use as instructed. DX: 250.00 04/13/13   Bruce  Colletta Maryland, MD  hydrALAZINE (APRESOLINE) 25 MG tablet Take 1 tablet (25 mg total) by mouth 3 (three) times daily. 02/07/14   [name], MD  isosorbide dinitrate (ISORDIL) 20 MG tablet Take 1 tablet (20 mg total) by mouth 3 (three) times daily. 02/07/14   [name], MD  nitroGLYCERIN (NITROSTAT) 0.4 MG SL tablet Place 1 tablet (0.4 mg total) under the tongue every 5 (five) minutes as needed for chest pain. 10/09/14   [name], MD  [facility] DELICA LANCETS FINE MISC Use as instructed. DX: 250.00 04/13/13   Kristian Covey, MD  oxyCODONE-acetaminophen (PERCOCET/ROXICET) 5-325 MG tablet Take 1 tablet by mouth every 6 (six) hours as needed for moderate pain or severe pain. 03/20/15   Samantha J Rhyne, PA-C  paregoric 2 MG/5ML solution TAKE 1 TEASPOONFUL BY MOUTH 4 TIMES A DAY AS NEEDED FOR DIARRHEA OR LOOSE STOOLS 11/18/14   Kristian Covey, MD  sodium bicarbonate 650 MG tablet Take 1,300 mg by mouth 2 (two) times daily. 05/05/14   Historical Provider, MD   BP 0/0 mmHg  Pulse 117  Temp(Src) 96.1 F (35.6 C) (Tympanic)  Resp 68  SpO2 0% Physical Exam  Constitutional: He appears distressed.  HENT:  Head: Normocephalic.  Eyes:  Pupils 4 mm nonreactive.  No corneal reflex  Cardiovascular:  CPR in progress  Pulmonary/Chest: No stridor.  Abdominal: He exhibits distension.  Musculoskeletal: He exhibits no edema.  Neurological:  GCS 3,  unresponsive.      ED Course  .Intubation Date/Time: 04/11/2015 10:48 PM Performed by: Dan Humphreys Authorized by: Lyndal Pulley Consent: The procedure was performed in an emergent situation. Patient identity confirmed: arm band Indications: airway protection Intubation method: direct Patient status: unconscious Laryngoscope size: Mac 4 Tube size: 7.5 mm Tube type: cuffed Number of attempts: 2 Ventilation between attempts: BVM Cords visualized: yes Post-procedure assessment: chest rise Breath sounds: equal Cuff inflated: yes ETT to lip: 5 cm Tube secured with: ETT holder Patient tolerance: Patient tolerated the procedure well with no immediate complications   (including critical care time) Labs Review Labs Reviewed - No data to display  Imaging Review No results found. I have personally reviewed and evaluated these images and lab results as part of my medical decision-making.   EKG Interpretation None      MDM   Final diagnoses:  Cardiac arrest [facility])   Patient with extensive heart history including CAD, ischemic cardio myopathy, chronic heart failure, CAD, diabetes presents via EMS for evaluation of cardiac arrest. Patient collapsed at home. 30 minutes of CPR were given prior to arrival. Patient received 7 rounds of epi prior to arrival. No shocks given.  Upon arrival patient asystole. Continued CPR intubated patient. Bedside ultrasound and no cardiac activity, no pneumothorax. 1 round of epinephrine given in emergency department. No return of pulses. No shockable rhythm.  Due to extensive CPR, a total rounds of epinephrine and no clear reversible cause, CPR was stopped.  Patient pronounced dead. Family notified.   Discussed with Dr. Clydene Pugh.      Dan Humphreys, MD 04/30/2015 [id number]  Lyndal Pulley, MD Apr 19, 2015 [id number]  Lyndal Pulley, MD 04/29/15 2223

## 2015-04-07 NOTE — Code Documentation (Signed)
TOD 2228

## 2015-04-08 MED ORDER — SODIUM CHLORIDE 0.9 % IV SOLN
INTRAVENOUS | Status: DC | PRN
  Administered 2015-04-07: 1000 mL via INTRAVENOUS

## 2015-04-23 ENCOUNTER — Encounter (HOSPITAL_COMMUNITY): Payer: Medicare Other

## 2015-05-03 NOTE — ED Notes (Signed)
Pt arrives via [facility], CPR in progress ongoing nearly one hour. Called out by friend after a fall resulting in head injury, pt was seen rolling around on the floor then ceased movement. Hematoma noted to R forehead, bilateral knees and R elbow. Bruising to L flank and L arm. Upon EMS arrival pt was PEA at a rate of 46, CPR initiated. EMS gave 7 rounds of epi and one 0.4 MG dose narcan. Pt maintained asystole through entire code. Capnography was not reading properly so patient brought to ED. No purposeful movement, pupils fixed and dilated. Blood coming from eyes.

## 2015-05-03 DEATH — deceased

## 2015-05-05 ENCOUNTER — Encounter (HOSPITAL_COMMUNITY): Payer: Medicare Other

## 2015-05-05 ENCOUNTER — Ambulatory Visit: Payer: Medicare Other | Admitting: Surgery
